# Patient Record
Sex: Female | Born: 1935
Health system: Southern US, Community
[De-identification: ages and names within clinical notes are randomized; demographics above are authoritative.]

## PROBLEM LIST (undated history)

## (undated) DIAGNOSIS — M545 Low back pain, unspecified: Secondary | ICD-10-CM

## (undated) DIAGNOSIS — D649 Anemia, unspecified: Secondary | ICD-10-CM

## (undated) DIAGNOSIS — F419 Anxiety disorder, unspecified: Secondary | ICD-10-CM

## (undated) DIAGNOSIS — I10 Essential (primary) hypertension: Secondary | ICD-10-CM

## (undated) DIAGNOSIS — I2510 Atherosclerotic heart disease of native coronary artery without angina pectoris: Secondary | ICD-10-CM

## (undated) DIAGNOSIS — E7800 Pure hypercholesterolemia, unspecified: Secondary | ICD-10-CM

## (undated) DIAGNOSIS — I872 Venous insufficiency (chronic) (peripheral): Secondary | ICD-10-CM

## (undated) DIAGNOSIS — M1990 Unspecified osteoarthritis, unspecified site: Secondary | ICD-10-CM

## (undated) HISTORY — DX: Pure hypercholesterolemia, unspecified: E78.00

## (undated) HISTORY — DX: Low back pain: M54.5

## (undated) HISTORY — DX: Essential (primary) hypertension: I10

## (undated) HISTORY — PX: ABDOMINAL HYSTERECTOMY: SHX81

## (undated) HISTORY — DX: Atherosclerotic heart disease of native coronary artery without angina pectoris: I25.10

## (undated) HISTORY — DX: Venous insufficiency (chronic) (peripheral): I87.2

## (undated) HISTORY — DX: Morbid (severe) obesity due to excess calories: E66.01

## (undated) HISTORY — DX: Unspecified osteoarthritis, unspecified site: M19.90

## (undated) HISTORY — DX: Anxiety disorder, unspecified: F41.9

## (undated) HISTORY — DX: Low back pain, unspecified: M54.50

## (undated) HISTORY — PX: CORONARY ARTERY BYPASS GRAFT: SHX141

## (undated) HISTORY — DX: Anemia, unspecified: D64.9

---

## 1998-02-08 ENCOUNTER — Other Ambulatory Visit: Admission: RE | Admit: 1998-02-08 | Discharge: 1998-02-08 | Payer: Self-pay | Admitting: Obstetrics and Gynecology

## 1999-07-29 ENCOUNTER — Encounter: Admission: RE | Admit: 1999-07-29 | Discharge: 1999-10-27 | Payer: Self-pay | Admitting: Pulmonary Disease

## 1999-10-13 ENCOUNTER — Other Ambulatory Visit: Admission: RE | Admit: 1999-10-13 | Discharge: 1999-10-13 | Payer: Self-pay | Admitting: Obstetrics & Gynecology

## 2000-03-19 ENCOUNTER — Encounter: Admission: RE | Admit: 2000-03-19 | Discharge: 2000-06-17 | Payer: Self-pay | Admitting: Pulmonary Disease

## 2000-07-02 ENCOUNTER — Encounter: Admission: RE | Admit: 2000-07-02 | Discharge: 2000-09-30 | Payer: Self-pay | Admitting: Pulmonary Disease

## 2002-12-14 ENCOUNTER — Encounter: Payer: Self-pay | Admitting: Pulmonary Disease

## 2002-12-14 ENCOUNTER — Ambulatory Visit (HOSPITAL_COMMUNITY): Admission: RE | Admit: 2002-12-14 | Discharge: 2002-12-14 | Payer: Self-pay | Admitting: Pulmonary Disease

## 2005-01-08 ENCOUNTER — Ambulatory Visit: Payer: Self-pay | Admitting: Pulmonary Disease

## 2005-01-29 ENCOUNTER — Encounter: Admission: RE | Admit: 2005-01-29 | Discharge: 2005-04-29 | Payer: Self-pay | Admitting: Pulmonary Disease

## 2005-04-30 ENCOUNTER — Encounter: Admission: RE | Admit: 2005-04-30 | Discharge: 2005-05-18 | Payer: Self-pay | Admitting: Pulmonary Disease

## 2005-05-28 ENCOUNTER — Encounter: Admission: RE | Admit: 2005-05-28 | Discharge: 2005-08-26 | Payer: Self-pay | Admitting: Pulmonary Disease

## 2005-07-07 ENCOUNTER — Ambulatory Visit: Payer: Self-pay | Admitting: Pulmonary Disease

## 2005-09-03 ENCOUNTER — Encounter: Admission: RE | Admit: 2005-09-03 | Discharge: 2005-12-02 | Payer: Self-pay | Admitting: Pulmonary Disease

## 2005-12-17 ENCOUNTER — Encounter: Admission: RE | Admit: 2005-12-17 | Discharge: 2006-01-21 | Payer: Self-pay | Admitting: Pulmonary Disease

## 2006-01-07 ENCOUNTER — Ambulatory Visit: Payer: Self-pay | Admitting: Pulmonary Disease

## 2006-02-22 ENCOUNTER — Encounter: Admission: RE | Admit: 2006-02-22 | Discharge: 2006-05-23 | Payer: Self-pay | Admitting: Pulmonary Disease

## 2006-04-22 ENCOUNTER — Encounter: Admission: RE | Admit: 2006-04-22 | Discharge: 2006-04-22 | Payer: Self-pay | Admitting: Pulmonary Disease

## 2006-07-08 ENCOUNTER — Ambulatory Visit: Payer: Self-pay | Admitting: Pulmonary Disease

## 2006-08-04 ENCOUNTER — Encounter: Admission: RE | Admit: 2006-08-04 | Discharge: 2006-11-02 | Payer: Self-pay | Admitting: Pulmonary Disease

## 2006-11-19 ENCOUNTER — Encounter: Admission: RE | Admit: 2006-11-19 | Discharge: 2007-02-17 | Payer: Self-pay | Admitting: Pulmonary Disease

## 2007-01-06 ENCOUNTER — Ambulatory Visit: Payer: Self-pay | Admitting: Pulmonary Disease

## 2007-01-06 LAB — CONVERTED CEMR LAB
AST: 29 units/L (ref 0–37)
Albumin: 3.8 g/dL (ref 3.5–5.2)
Alkaline Phosphatase: 80 units/L (ref 39–117)
BUN: 17 mg/dL (ref 6–23)
Basophils Absolute: 0.1 10*3/uL (ref 0.0–0.1)
Chloride: 103 meq/L (ref 96–112)
Cholesterol: 196 mg/dL (ref 0–200)
Creatinine, Ser: 0.8 mg/dL (ref 0.4–1.2)
Eosinophils Absolute: 0.2 10*3/uL (ref 0.0–0.6)
GFR calc non Af Amer: 76 mL/min
HDL: 51.7 mg/dL (ref >39.0)
Hgb A1c MFr Bld: 6.8 % — ABNORMAL HIGH (ref 4.6–6.0)
LDL Cholesterol: 106 mg/dL — ABNORMAL HIGH (ref 0–99)
MCHC: 33.8 g/dL (ref 30.0–36.0)
MCV: 92.3 fL (ref 78.0–100.0)
Monocytes Relative: 8.3 % (ref 3.0–11.0)
Neutrophils Relative %: 59.2 % (ref 43.0–77.0)
Platelets: 145 10*3/uL — ABNORMAL LOW (ref 150–400)
RBC: 3.79 M/uL — ABNORMAL LOW (ref 3.87–5.11)
RDW: 13.2 % (ref 11.5–14.6)
Sodium: 138 meq/L (ref 135–145)
TSH: 2.7 microintl units/mL (ref 0.35–5.50)
Total Bilirubin: 0.6 mg/dL (ref 0.3–1.2)
Total CHOL/HDL Ratio: 3.8
Triglycerides: 190 mg/dL — ABNORMAL HIGH (ref 0–149)

## 2007-02-17 ENCOUNTER — Encounter: Admission: RE | Admit: 2007-02-17 | Discharge: 2007-02-17 | Payer: Self-pay | Admitting: Pulmonary Disease

## 2007-05-12 ENCOUNTER — Ambulatory Visit: Payer: Self-pay | Admitting: Pulmonary Disease

## 2007-05-12 LAB — CONVERTED CEMR LAB
Calcium: 9.7 mg/dL (ref 8.4–10.5)
Chloride: 103 meq/L (ref 96–112)
Creatinine, Ser: 1 mg/dL (ref 0.4–1.2)
GFR calc non Af Amer: 58 mL/min
Glucose, Bld: 146 mg/dL — ABNORMAL HIGH (ref 70–99)
Hgb A1c MFr Bld: 6.9 % — ABNORMAL HIGH (ref 4.6–6.0)

## 2007-05-19 ENCOUNTER — Encounter: Admission: RE | Admit: 2007-05-19 | Discharge: 2007-08-17 | Payer: Self-pay | Admitting: Pulmonary Disease

## 2007-07-11 ENCOUNTER — Ambulatory Visit: Payer: Self-pay | Admitting: Pulmonary Disease

## 2007-07-11 LAB — CONVERTED CEMR LAB
Ketones, ur: NEGATIVE mg/dL
Specific Gravity, Urine: 1.015 (ref 1.000–1.03)
pH: 6 (ref 5.0–8.0)

## 2007-07-14 ENCOUNTER — Encounter: Admission: RE | Admit: 2007-07-14 | Discharge: 2007-08-11 | Payer: Self-pay | Admitting: Pulmonary Disease

## 2007-08-08 DIAGNOSIS — Z862 Personal history of diseases of the blood and blood-forming organs and certain disorders involving the immune mechanism: Secondary | ICD-10-CM

## 2007-08-08 DIAGNOSIS — M1990 Unspecified osteoarthritis, unspecified site: Secondary | ICD-10-CM | POA: Insufficient documentation

## 2007-08-08 DIAGNOSIS — M545 Low back pain: Secondary | ICD-10-CM

## 2007-08-08 DIAGNOSIS — F411 Generalized anxiety disorder: Secondary | ICD-10-CM | POA: Insufficient documentation

## 2007-08-08 DIAGNOSIS — I2510 Atherosclerotic heart disease of native coronary artery without angina pectoris: Secondary | ICD-10-CM | POA: Insufficient documentation

## 2007-08-08 DIAGNOSIS — I10 Essential (primary) hypertension: Secondary | ICD-10-CM

## 2007-08-08 DIAGNOSIS — E7800 Pure hypercholesterolemia, unspecified: Secondary | ICD-10-CM

## 2007-09-15 ENCOUNTER — Ambulatory Visit: Payer: Self-pay | Admitting: Pulmonary Disease

## 2007-09-15 DIAGNOSIS — E119 Type 2 diabetes mellitus without complications: Secondary | ICD-10-CM

## 2007-09-15 DIAGNOSIS — E785 Hyperlipidemia, unspecified: Secondary | ICD-10-CM | POA: Insufficient documentation

## 2007-09-15 DIAGNOSIS — J309 Allergic rhinitis, unspecified: Secondary | ICD-10-CM | POA: Insufficient documentation

## 2007-10-04 LAB — CONVERTED CEMR LAB
ALT: 28 units/L (ref 0–35)
AST: 29 units/L (ref 0–37)
Albumin: 3.9 g/dL (ref 3.5–5.2)
Alkaline Phosphatase: 89 units/L (ref 39–117)
BUN: 20 mg/dL (ref 6–23)
Basophils Absolute: 0.1 10*3/uL (ref 0.0–0.1)
Basophils Relative: 1 % (ref 0.0–1.0)
Bilirubin, Direct: 0.1 mg/dL (ref 0.0–0.3)
CO2: 29 meq/L (ref 19–32)
Calcium: 9.7 mg/dL (ref 8.4–10.5)
Chloride: 100 meq/L (ref 96–112)
Cholesterol: 189 mg/dL (ref 0–200)
Creatinine, Ser: 1.2 mg/dL (ref 0.4–1.2)
Eosinophils Absolute: 0.3 10*3/uL (ref 0.0–0.6)
Eosinophils Relative: 4.1 % (ref 0.0–5.0)
GFR calc Af Amer: 57 mL/min
GFR calc non Af Amer: 47 mL/min
Glucose, Bld: 157 mg/dL — ABNORMAL HIGH (ref 70–99)
HCT: 36.6 % (ref 36.0–46.0)
HDL: 50.5 mg/dL (ref >39.0)
Hemoglobin: 12.6 g/dL (ref 12.0–15.0)
Hgb A1c MFr Bld: 6.6 % — ABNORMAL HIGH (ref 4.6–6.0)
LDL Cholesterol: 100 mg/dL — ABNORMAL HIGH (ref 0–99)
Lymphocytes Relative: 24.1 % (ref 12.0–46.0)
MCHC: 34.4 g/dL (ref 30.0–36.0)
MCV: 92.1 fL (ref 78.0–100.0)
Monocytes Absolute: 0.6 10*3/uL (ref 0.2–0.7)
Monocytes Relative: 8.5 % (ref 3.0–11.0)
Neutro Abs: 4.3 10*3/uL (ref 1.4–7.7)
Neutrophils Relative %: 62.3 % (ref 43.0–77.0)
Platelets: 165 10*3/uL (ref 150–400)
Potassium: 4.3 meq/L (ref 3.5–5.1)
RBC: 3.98 M/uL (ref 3.87–5.11)
RDW: 13.5 % (ref 11.5–14.6)
Sodium: 138 meq/L (ref 135–145)
TSH: 2.81 microintl units/mL (ref 0.35–5.50)
Total Bilirubin: 0.6 mg/dL (ref 0.3–1.2)
Total CHOL/HDL Ratio: 3.7
Total Protein: 7.6 g/dL (ref 6.0–8.3)
Triglycerides: 195 mg/dL — ABNORMAL HIGH (ref 0–149)
VLDL: 39 mg/dL (ref 0–40)
WBC: 7 10*3/uL (ref 4.5–10.5)

## 2007-10-13 ENCOUNTER — Encounter: Admission: RE | Admit: 2007-10-13 | Discharge: 2008-01-11 | Payer: Self-pay | Admitting: Pulmonary Disease

## 2007-11-09 ENCOUNTER — Telehealth (INDEPENDENT_AMBULATORY_CARE_PROVIDER_SITE_OTHER): Payer: Self-pay | Admitting: *Deleted

## 2007-12-08 ENCOUNTER — Encounter: Payer: Self-pay | Admitting: Pulmonary Disease

## 2008-01-12 ENCOUNTER — Ambulatory Visit: Payer: Self-pay | Admitting: Pulmonary Disease

## 2008-01-15 DIAGNOSIS — I872 Venous insufficiency (chronic) (peripheral): Secondary | ICD-10-CM | POA: Insufficient documentation

## 2008-01-15 LAB — CONVERTED CEMR LAB
CO2: 28 meq/L (ref 19–32)
Calcium: 9.7 mg/dL (ref 8.4–10.5)
GFR calc Af Amer: 63 mL/min
Hgb A1c MFr Bld: 7 % — ABNORMAL HIGH (ref 4.6–6.0)
Sodium: 137 meq/L (ref 135–145)

## 2008-01-25 ENCOUNTER — Encounter: Payer: Self-pay | Admitting: Pulmonary Disease

## 2008-01-27 ENCOUNTER — Encounter: Admission: RE | Admit: 2008-01-27 | Discharge: 2008-01-27 | Payer: Self-pay | Admitting: Orthopedic Surgery

## 2008-02-06 ENCOUNTER — Encounter: Payer: Self-pay | Admitting: Pulmonary Disease

## 2008-02-09 ENCOUNTER — Encounter: Admission: RE | Admit: 2008-02-09 | Discharge: 2008-02-09 | Payer: Self-pay | Admitting: Pulmonary Disease

## 2008-04-13 ENCOUNTER — Telehealth (INDEPENDENT_AMBULATORY_CARE_PROVIDER_SITE_OTHER): Payer: Self-pay | Admitting: *Deleted

## 2008-05-16 ENCOUNTER — Encounter: Admission: RE | Admit: 2008-05-16 | Discharge: 2008-05-16 | Payer: Self-pay | Admitting: Pulmonary Disease

## 2008-06-13 ENCOUNTER — Ambulatory Visit: Payer: Self-pay | Admitting: Pulmonary Disease

## 2008-07-01 LAB — CONVERTED CEMR LAB
ALT: 31 units/L (ref 0–35)
Albumin: 3.8 g/dL (ref 3.5–5.2)
Basophils Absolute: 0.1 10*3/uL (ref 0.0–0.1)
Basophils Relative: 1.1 % (ref 0.0–3.0)
Calcium: 9.6 mg/dL (ref 8.4–10.5)
Cholesterol: 192 mg/dL (ref 0–200)
Creatinine, Ser: 1 mg/dL (ref 0.4–1.2)
Eosinophils Absolute: 0.2 10*3/uL (ref 0.0–0.7)
GFR calc non Af Amer: 58 mL/min
HCT: 37.3 % (ref 36.0–46.0)
Hemoglobin: 12.8 g/dL (ref 12.0–15.0)
Hgb A1c MFr Bld: 6.7 % — ABNORMAL HIGH (ref 4.6–6.0)
MCHC: 34.3 g/dL (ref 30.0–36.0)
MCV: 93.8 fL (ref 78.0–100.0)
Neutro Abs: 2.9 10*3/uL (ref 1.4–7.7)
RBC: 3.97 M/uL (ref 3.87–5.11)
TSH: 1.93 microintl units/mL (ref 0.35–5.50)
Total Bilirubin: 0.7 mg/dL (ref 0.3–1.2)

## 2008-09-12 ENCOUNTER — Encounter: Admission: RE | Admit: 2008-09-12 | Discharge: 2008-12-11 | Payer: Self-pay | Admitting: Pulmonary Disease

## 2008-11-28 ENCOUNTER — Encounter: Payer: Self-pay | Admitting: Pulmonary Disease

## 2008-12-12 ENCOUNTER — Ambulatory Visit: Payer: Self-pay | Admitting: Pulmonary Disease

## 2008-12-16 LAB — CONVERTED CEMR LAB
Albumin: 3.8 g/dL (ref 3.5–5.2)
Alkaline Phosphatase: 83 units/L (ref 39–117)
Basophils Absolute: 0 10*3/uL (ref 0.0–0.1)
Basophils Relative: 0.1 % (ref 0.0–3.0)
CO2: 30 meq/L (ref 19–32)
Chloride: 103 meq/L (ref 96–112)
Direct LDL: 124.7 mg/dL
Eosinophils Absolute: 0.1 10*3/uL (ref 0.0–0.7)
Glucose, Bld: 160 mg/dL — ABNORMAL HIGH (ref 70–99)
HCT: 40 % (ref 36.0–46.0)
Hemoglobin: 13.5 g/dL (ref 12.0–15.0)
Lymphocytes Relative: 24.6 % (ref 12.0–46.0)
Lymphs Abs: 1.6 10*3/uL (ref 0.7–4.0)
MCHC: 33.8 g/dL (ref 30.0–36.0)
MCV: 93.4 fL (ref 78.0–100.0)
Monocytes Absolute: 0.3 10*3/uL (ref 0.1–1.0)
Neutro Abs: 4.4 10*3/uL (ref 1.4–7.7)
Potassium: 4.1 meq/L (ref 3.5–5.1)
RBC: 4.28 M/uL (ref 3.87–5.11)
RDW: 12.9 % (ref 11.5–14.6)
Sodium: 142 meq/L (ref 135–145)
TSH: 2.2 microintl units/mL (ref 0.35–5.50)
Total CHOL/HDL Ratio: 4
Total Protein: 7.6 g/dL (ref 6.0–8.3)
Triglycerides: 170 mg/dL — ABNORMAL HIGH (ref 0.0–149.0)

## 2009-01-06 DIAGNOSIS — E559 Vitamin D deficiency, unspecified: Secondary | ICD-10-CM | POA: Insufficient documentation

## 2009-01-09 ENCOUNTER — Encounter: Payer: Self-pay | Admitting: Pulmonary Disease

## 2009-06-12 ENCOUNTER — Ambulatory Visit: Payer: Self-pay | Admitting: Pulmonary Disease

## 2009-06-12 LAB — CONVERTED CEMR LAB
Chloride: 102 meq/L (ref 96–112)
GFR calc non Af Amer: 56.73 mL/min (ref >60)
Glucose, Bld: 179 mg/dL — ABNORMAL HIGH (ref 70–99)
Hgb A1c MFr Bld: 6.9 % — ABNORMAL HIGH (ref 4.6–6.5)
Potassium: 4.3 meq/L (ref 3.5–5.1)
Sodium: 142 meq/L (ref 135–145)
Total Bilirubin: 0.5 mg/dL (ref 0.3–1.2)
Total Protein: 7 g/dL (ref 6.0–8.3)
VLDL: 37.6 mg/dL (ref 0.0–40.0)

## 2009-12-12 ENCOUNTER — Ambulatory Visit: Payer: Self-pay | Admitting: Pulmonary Disease

## 2009-12-13 LAB — CONVERTED CEMR LAB
AST: 37 units/L (ref 0–37)
Alkaline Phosphatase: 69 units/L (ref 39–117)
Basophils Absolute: 0 10*3/uL (ref 0.0–0.1)
Bilirubin, Direct: 0.1 mg/dL (ref 0.0–0.3)
Calcium: 9.6 mg/dL (ref 8.4–10.5)
Cholesterol: 202 mg/dL — ABNORMAL HIGH (ref 0–200)
Direct LDL: 120.4 mg/dL
GFR calc non Af Amer: 62.63 mL/min (ref >60)
Glucose, Bld: 143 mg/dL — ABNORMAL HIGH (ref 70–99)
HCT: 36.6 % (ref 36.0–46.0)
Hemoglobin: 12.3 g/dL (ref 12.0–15.0)
Hgb A1c MFr Bld: 7 % — ABNORMAL HIGH (ref 4.6–6.5)
Lymphs Abs: 1.2 10*3/uL (ref 0.7–4.0)
MCHC: 33.5 g/dL (ref 30.0–36.0)
MCV: 94.2 fL (ref 78.0–100.0)
Monocytes Absolute: 0.4 10*3/uL (ref 0.1–1.0)
Neutro Abs: 3.5 10*3/uL (ref 1.4–7.7)
Platelets: 154 10*3/uL (ref 150.0–400.0)
Potassium: 4 meq/L (ref 3.5–5.1)
RDW: 14 % (ref 11.5–14.6)
Sodium: 140 meq/L (ref 135–145)
Total Bilirubin: 0.3 mg/dL (ref 0.3–1.2)
VLDL: 36.2 mg/dL (ref 0.0–40.0)
Vit D, 25-Hydroxy: 42 ng/mL (ref 30–89)

## 2010-06-11 ENCOUNTER — Ambulatory Visit: Payer: Self-pay | Admitting: Pulmonary Disease

## 2010-06-16 LAB — CONVERTED CEMR LAB
AST: 41 units/L — ABNORMAL HIGH (ref 0–37)
Albumin: 3.6 g/dL (ref 3.5–5.2)
Alkaline Phosphatase: 91 units/L (ref 39–117)
BUN: 20 mg/dL (ref 6–23)
Chloride: 102 meq/L (ref 96–112)
Cholesterol: 170 mg/dL (ref 0–200)
Creatinine, Ser: 1 mg/dL (ref 0.4–1.2)
GFR calc non Af Amer: 69.02 mL/min (ref >60)
HDL: 48 mg/dL (ref >39.00)
Hgb A1c MFr Bld: 7.3 % — ABNORMAL HIGH (ref 4.6–6.5)
LDL Cholesterol: 96 mg/dL (ref 0–99)
Potassium: 4.5 meq/L (ref 3.5–5.1)
Total Bilirubin: 0.4 mg/dL (ref 0.3–1.2)
VLDL: 26 mg/dL (ref 0.0–40.0)

## 2010-06-24 ENCOUNTER — Encounter: Payer: Self-pay | Admitting: Pulmonary Disease

## 2010-07-22 ENCOUNTER — Encounter: Payer: Self-pay | Admitting: Pulmonary Disease

## 2010-09-25 NOTE — Assessment & Plan Note (Signed)
Summary: ROV 6 MONTHS///KP   CC:  6 month ROV & review of mult medical problems....  History of Present Illness: 76 y/o BF here for a 4 month follow up visit... she has mult med problems as noted below...    ~  Apr10:  she has had a good 6 mo... CC= allergies, and we discussed strategies for coping w/ all the pollen...  ~  Oct10:  she continues to have difficulty w/ diet + exercise- mult excuses & we discussed strategies for weight reduction... otherw feeling OK, takes meds regularly, no new complaints or concerns... sees chiropractor every 6 weeks- "that's all that medicare will pay"...   ~  December 12, 2009:  she is c/o incr "muscle spasms" in her buttocks which, I believe, is really radicular pain from her severe spinal dis w/ known multilevel sp stenosis> prev eval 2009 by DrGioffre & on Rx w/ Etodolac 400mg Bid, VicodinTid, FlexerilTid... she had refused shots & she states that DrG wanted to operate which she didn't agree with... she's been under the care of her Chiropractor & seen Q6wks... she requests Ortho/ Rheum second opinion regarding her pain & we will set this up for her... Unfortunately she has been unable to lose weight & according to our scales is up 15# to 299# today> we discussed diet + exercise program...   ~  June 11, 2010:  she never went to the Rheumatologist (resched for 06/24/10) & has increased her Chiropractor visits to Q2wks...  BP has been controlled on meds;  no CP, palpit, or ch in SOB;  FLP looks good on Simva80 which she tolerates well;  wt is down sl to 285# but FBS=144, & A1c=7.3 on the Glimepiride (rec dose incr to 2mg  Qam)... OK Flu shot today.    Current Problem List:    ALLERGIC RHINITIS (ICD-477.9) - on ALLEGRA 180mg /d, & FLONASE Prn...  HYPERTENSION (ICD-401.9) - controlled on TOPROLXL 100mg /d, AMLODIPINE 5mg /d,  and DYAZIDE 1/d.... BP= 146/80 today, must get her weight down!... takes meds regularly and tol well... denies HA, fatigue, visual changes, CP,  palipit, dizziness, syncope, edema, etc... pt encouraged to continue weight loss... she doesn't want a stronger fluid pill...  ARTERIOSCLEROTIC HEART DISEASE (ICD-414.00) - on ASA 325mg /d...  S/P CABGx3 8/96 by DrBartle for 3 vessel CAD... she denies CP, palpit etc... still too sedentary & limited by DJD, obesity...  ~  NuclearStressTest 5/05 was neg without ischemia & EF=70%...  VENOUS INSUFFICIENCY (ICD-459.81) - she has mod ven insuffic and edema... we discussed low sodium diet, elevation, and support hose... does not want stronger diuretic due to urination...  HYPERCHOLESTEROLEMIA (ICD-272.0) - on SIMVASTATIN 80/d...   ~  FLP 5/08 showed TChol 196, TG 190, HDL 52, LDL 106  ~  FLP 1/09 showed TChol 189, TG 195, HDL 57, LDL 100  ~  FLP 10/09 showed TChol 192, TG 146, HDL 55, LDL 108  ~  FLP 4/10 showed TChol 212, TG 170, HDL 59, LDL 125... rec> same med, take daily, get wt down.  ~  FLP 10/10 showed TChol 193, TG 188, HDL 49, LDL 106  ~  FLP 4/11 showed TChol 202, TG 181, HDL 56, LDL 120... not as good w/ recent wt gain.  ~  FLP 10/11 showed TChol 170, TG 130, HDL 48, LDL 96  DIABETES MELLITUS (ICD-250.00) - she had the metabolic syndrome x yrs but was unable to lose wt and refused Metformin rx "it made me sad"... labs 5/08 w/ BS=134 & HgA1c=6.8>  therefore started GLIMEPERIDE 1mg /d...   ~  labs 1/09 showed BS= 157, HgA1c= 6.6  ~  labs 5/09 showed BS= 168, HgA1c= 7.0.[name] KitchenMarland Kitchen rec same med, better diet, get wt down.  ~  labs 10/09 showed BS= 152, A1c= 6.7  ~  labs 4/10 showed BS= 160, A1c= 6.8  ~  labs 10/10 showed BS= 179, A1c= 6.9  ~  labs 4/11 showed BS= 143, A1c= 7.0  ~  labs 10/11 showed BS= 144, A1c= 7.3.[name]. rec> incr Glimep to 2mg  Qam.  MORBID OBESITY (ICD-278.00) - ?still goes to the [facility] nutrition [facility] monthly and sees "[facility]"... unfortunately she's been having difficulty losing weight... she is 5' tall, BMI= >>55... she has been well counselled on diet and exercise... [facility]  [facility] apparently the handicap spots are still too far away & she can't make the walk to the pool...  ~  weight 10/09 = 281#  ~  weight 4/10 = 286#  ~  weight 10/10 = 284#... we reviewed diet + exercise plan.  ~  weight 4/11 = 299#.[name] KitchenMarland Kitchen ?what happened?  ~  weight 10/11 = 285#  DEGENERATIVE JOINT DISEASE (ICD-715.90) - on ETODOLAC 400mg Bid + VICODIN Tid Prn... DrGioffre follows w/ discomfort in her L Knee, and back... also sees chiropractor...  LOW BACK PAIN, CHRONIC (ICD-724.2) - on FLEXERIL 10mg Tid Prn... [name] KitchenXRays in 2004 showed mod multilevel spondylosis w/ scoliosis...  "I'm hurting all the time" eval 6/09 by DrGioffre w/ multilevel spinal stenosis at L3-4 L4-5 L5-S1... she refused shots, & refused surg...  she has been seeing DrNazar, a chiropractor, for adjustments every 6 weeks...  ~  4/11:  c/o incr buttock pain & refuses to see DrGioffre again... she would like second opinion> Rheum consult pending.  VITAMIN D DEFICIENCY (ICD-268.9) - labs 10/09 showed Vit D level = 25... rec> take Vit D OTC 1000 u daily...  ANXIETY (ICD-300.00) - on ALPRAZOLAM 0.5mg Tid Prn...  ANEMIA, HX OF (ICD-V12.3)  ~  labs 4/10 showed Hg= 13.5  ~  labs 4/11 showed Hg= 12.3   Preventive Screening-Counseling & Management  Alcohol-Tobacco     Smoking Status: never  Allergies: 1)  ! Metformin Hcl (Metformin Hcl)  Comments:  Nurse/Medical Assistant: The patient's medications and allergies were reviewed with the patient and were updated in the Medication and Allergy Lists.  Past History:  Past Medical History: ALLERGIC RHINITIS (ICD-477.9) HYPERTENSION (ICD-401.9) ARTERIOSCLEROTIC HEART DISEASE (ICD-414.00) VENOUS INSUFFICIENCY (ICD-459.81) HYPERCHOLESTEROLEMIA (ICD-272.0) DIABETES MELLITUS (ICD-250.00) MORBID OBESITY (ICD-278.01) DEGENERATIVE JOINT DISEASE (ICD-715.90) LOW BACK PAIN, CHRONIC (ICD-724.2) VITAMIN D DEFICIENCY (ICD-268.9) ANXIETY  (ICD-300.00) ANEMIA, HX OF (ICD-V12.3)  Past Surgical History: S/P 3 vessel CABG 8/96 by DrBartle  Family History: Reviewed history from 01/12/2008 and no changes required. mother died at age 28 from heart complications 1 sibling 1 brother alive at 24 with heart problems  Social History: Reviewed history from 01/12/2008 and no changes required. pt is retired widowed has 3 children has never smoked and does not drink  Review of Systems      See HPI       The patient complains of dyspnea on exertion and difficulty walking.  The patient denies anorexia, fever, weight loss, weight gain, vision loss, decreased hearing, hoarseness, chest pain, syncope, peripheral edema, prolonged cough, headaches, hemoptysis, abdominal pain, melena, hematochezia, severe indigestion/heartburn, hematuria, incontinence, muscle weakness, suspicious skin lesions, transient blindness, depression, unusual weight change, abnormal bleeding, enlarged lymph nodes, and angioedema.  Vital Signs:  Patient profile:   76 year old female Height:      60 inches Weight:      285.13 pounds BMI:     55.89 O2 Sat:      97 % on Room air Temp:     97.0 degrees F oral Pulse rate:   96 / minute BP sitting:   146 / 80  (left arm) Cuff size:   large  Vitals Entered By: Randell Loop CMA (June 11, 2010 9:07 AM)  O2 Sat at Rest %:  97 O2 Flow:  Room air CC: 6 month ROV & review of mult medical problems... Is Patient Diabetic? Yes Pain Assessment Patient in pain? no      Comments no changes in meds today   Physical Exam  Additional Exam:  WD, Morbidly Obese, 75 y/o BF in NAD... GENERAL:  Alert & oriented; pleasant & cooperative. HEENT:  [place]/AT, EOM-wnl, PERRLA, EACs-clear, TMs-wnl, NOSE-clear, THROAT-clear & wnl. NECK:  Supple w/ fairROM; no JVD; normal carotid impulses w/o bruits; no thyromegaly or nodules palpated; no lymphadenopathy. CHEST:  Decr BS bilat, clear to P & A; without wheezes/ rales/ or rhonchi  heard... HEART:  Regular Rhythm; without murmurs/ rubs/ or gallops detected... ABDOMEN:  Obese soft & nontender, panniculus; normal bowel sounds; no organomegaly or masses palpated... EXT: without deformities, mod arthritic changes; no varicose veins/ +venous insuffic/ 1-2+ edema. BACK:  sl tender over PSIS's... can't lay flat, SLR is painful, symmetric DTR's... NEURO:  CN's intact; no focal neuro deficits... DERM:  No lesions noted; no rash etc...    MISC. Report  Procedure date:  06/11/2010  Findings:      BMP (METABOL)   Sodium                    139 mEq/L                   135-145   Potassium                 4.5 mEq/L                   3.5-5.1   Chloride                  102 mEq/L                   96-112   Carbon Dioxide            30 mEq/L                    19-32   Glucose              [H]  144 mg/dL                   16-10   BUN                       20 mg/dL                    9-60   Creatinine                1.0 mg/dL                   4.5-4.0   Calcium                   9.5 mg/dL  8.4-10.5   GFR                       69.02 mL/min                >60  Hemoglobin A1C (A1C)   Hemoglobin A1C       [H]  7.3 %                       4.6-6.5   Hepatic/Liver Function Panel (HEPATIC)   Total Bilirubin           0.4 mg/dL                   7.8-2.9   Direct Bilirubin          0.0 mg/dL                   5.6-2.1   Alkaline Phosphatase      91 U/L                      39-117   AST                  [H]  41 U/L                      0-37   ALT                       35 U/L                      0-35   Total Protein             7.0 g/dL                    3.0-8.6   Albumin                   3.6 g/dL                    5.7-8.4  Lipid Panel (LIPID)   Cholesterol               170 mg/dL                   6-962   Triglycerides             130.0 mg/dL                 9.5-284.1   HDL                       32.44 mg/dL                 >01.02   LDL Cholesterol           96  mg/dL                    7-25   Impression & Recommendations:  Problem # 1:  HYPERTENSION (ICD-401.9) Controlled>  same meds. Her updated medication list for this problem includes:    Toprol Xl 100 Mg Tb24 (Metoprolol succinate) .[name]... Take 1 tablet by mouth once a day    Amlodipine Besylate 5 Mg Tabs (Amlodipine besylate) .[name]... Take 1 tablet by mouth once a day    Triamterene-hctz 37.5-25 Mg Tabs (Triamterene-hctz) .[name]... Take 1 tablet  by mouth once a day  Orders: TLB-BMP (Basic Metabolic Panel-BMET) (80048-METABOL) TLB-A1C / Hgb A1C (Glycohemoglobin) (83036-A1C) TLB-Hepatic/Liver Function Pnl (80076-HEPATIC) TLB-Lipid Panel (80061-LIPID)  Problem # 2:  ARTERIOSCLEROTIC HEART DISEASE (ICD-414.00) Stable w/o angina, etc... Her updated medication list for this problem includes:    Aspirin 325 Mg Tabs (Aspirin) .[name]... Take 1 tablet by mouth once a day    Toprol Xl 100 Mg Tb24 (Metoprolol succinate) .[name]... Take 1 tablet by mouth once a day    Amlodipine Besylate 5 Mg Tabs (Amlodipine besylate) .[name]... Take 1 tablet by mouth once a day    Triamterene-hctz 37.5-25 Mg Tabs (Triamterene-hctz) .[name]... Take 1 tablet by mouth once a day  Problem # 3:  HYPERCHOLESTEROLEMIA (ICD-272.0) FLP looks good & tolerating the Simva satis... Her updated medication list for this problem includes:    Simvastatin 80 Mg Tabs (Simvastatin) .[name]... Take 1 tablet by mouth once a day  Problem # 4:  DIABETES MELLITUS (ICD-250.00) Her A1c continues to climb... needs better diet & lose weight... for now incr the Glimep tp 2mg /d... Her updated medication list for this problem includes:    Aspirin 325 Mg Tabs (Aspirin) .[name]... Take 1 tablet by mouth once a day    Glimepiride 2 Mg Tabs (Glimepiride) .[name]... Take 1 tab by mouth once daily...  Problem # 5:  MORBID OBESITY (ICD-278.01) Diet + exercise are the keys...  Problem # 6:  LOW BACK PAIN, CHRONIC (ICD-724.2) Followed by Ortho, Chiropractor, & Rheum consult  pending... Her updated medication list for this problem includes:    Aspirin 325 Mg Tabs (Aspirin) .[name]... Take 1 tablet by mouth once a day    Etodolac 400 Mg Tabs (Etodolac) .[name]... 1 tab by mouth two times a day as needed for arthritis pain... (take w/ food)    Vicodin 5-500 Mg Tabs (Hydrocodone-acetaminophen) .[name]... Take one tablet by mouth three times a day as needed for pain    Flexeril 10 Mg Tabs (Cyclobenzaprine hcl) .[name]... Take one tablet by mouth three times a day as needed for muscle spasms  Problem # 7:  OTHER MEDICAL PROBLEMS AS NOTED>>> OK Flu vaccine today...  Complete Medication List: 1)  Flonase 50 Mcg/act Susp (Fluticasone propionate) .... 2 sprays in each nostril at bedtime.[name] KitchenMarland Kitchen 2)  Fexofenadine Hcl 180 Mg Tabs (Fexofenadine hcl) .... Take 1 tablet by mouth once a day 3)  Aspirin 325 Mg Tabs (Aspirin) .... Take 1 tablet by mouth once a day 4)  Toprol Xl 100 Mg Tb24 (Metoprolol succinate) .... Take 1 tablet by mouth once a day 5)  Amlodipine Besylate 5 Mg Tabs (Amlodipine besylate) .... Take 1 tablet by mouth once a day 6)  Triamterene-hctz 37.5-25 Mg Tabs (Triamterene-hctz) .... Take 1 tablet by mouth once a day 7)  Simvastatin 80 Mg Tabs (Simvastatin) .... Take 1 tablet by mouth once a day 8)  Glimepiride 2 Mg Tabs (Glimepiride) .... Take 1 tab by mouth once daily.[name] KitchenMarland Kitchen 9)  Etodolac 400 Mg Tabs (Etodolac) .[name].. 1 tab by mouth two times a day as needed for arthritis pain... (take w/ food) 10)  Vicodin 5-500 Mg Tabs (Hydrocodone-acetaminophen) .... Take one tablet by mouth three times a day as needed for pain 11)  Flexeril 10 Mg Tabs (Cyclobenzaprine hcl) .... Take one tablet by mouth three times a day as needed for muscle spasms 12)  Alprazolam 0.5 Mg Tabs (Alprazolam) .... Take 1 tablet by mouth three times a day as needed for nerves... 13)  Protegra Caps (Multiple vitamins-minerals) .[name] KitchenMarland KitchenMarland Kitchen  Take 1 capsule by mouth once a day 14)  Vitamin D 1000 Unit Caps (Cholecalciferol) .... Take 1  cap daily...  Other Orders: Flu Vaccine 74yrs + MEDICARE PATIENTS (Z6109) Administration Flu vaccine - MCR (U0454)  Patient Instructions: 1)  Today we updated your med list- see below.... 2)  We refilled your Vicodin & Alprazolam as requested... 3)  Today we did your follow up FASTING blood work... please call the "phone tree" in a few days for your lab results.[name] KitchenMarland Kitchen 4)  We also gave you the 2011 Flu vaccine... 5)  Be sure to start that FOOD DIARY to aide in your diet program & weight loss efforts... 6)  Call for any problems.[name] KitchenMarland Kitchen 7)  Please schedule a follow-up appointment in 6 months. Prescriptions: ALPRAZOLAM 0.5 MG  TABS (ALPRAZOLAM) Take 1 tablet by mouth three times a day as needed for nerves...  #90 x 6   Entered and Authorized by:   Michele Mcalpine MD   Signed by:   Michele Mcalpine MD on 06/11/2010   Method used:   Print then Give to Patient   RxID:   [id number] VICODIN 5-500 MG TABS (HYDROCODONE-ACETAMINOPHEN) take one tablet by mouth three times a day as needed for pain  #90 x 6   Entered and Authorized by:   Michele Mcalpine MD   Signed by:   Michele Mcalpine MD on 06/11/2010   Method used:   Print then Give to Patient   RxID:   [id number]       Flu Vaccine Consent Questions     Do you have a history of severe allergic reactions to this vaccine? no    Any prior history of allergic reactions to egg and/or gelatin? no    Do you have a sensitivity to the preservative Thimersol? no    Do you have a past history of Guillan-Barre Syndrome? no    Do you currently have an acute febrile illness? no    Have you ever had a severe reaction to latex? no    Vaccine information given and explained to patient? yes    Are you currently pregnant? no    Lot Number:[id number]   Exp Date:02/21/2011   Site Given  Left Deltoid IMu1 Randell Loop [facility]  June 11, 2010 9:59 AM

## 2010-09-25 NOTE — Consult Note (Signed)
Summary: [facility]  [facility]   Imported By: Sherian Rein 07/05/2010 11:15:06  _____________________________________________________________________  External Attachment:    Type:   Image     Comment:   External Document

## 2010-09-25 NOTE — Assessment & Plan Note (Signed)
Summary: 6 months/apc   CC:  6 month ROV & review of mult medical problems....  History of Present Illness: 76 y/o BF here for a 4 month follow up visit... she has mult med problems as noted below...    ~  December 12, 2008:  she has had a good 6 mo... CC= allergies, and we discussed strategies for coping w/ all the pollen...   ~  June 12, 2009:  she continues to have difficulty w/ diet + exercise- mult excuses & we discussed strategies for weight reduction... otherw feeling OK, takes meds regularly, no new complaints or concerns... sees chiropractor every 6 weeks- "that's all that medicare will pay"...   ~  December 12, 2009:  she is c/o incr "muscle spasms" in her buttocks which, I believe, is really radicular pain from her severe spinal dis w/ known multilevel sp stenosis> prev eval 2009 by DrGioffre & on Rx w/ Etodolac 400mg Bid, VicodinTid, FlexerilTid... she had refused shots & she states that DrG wanted to operate which she didn't agree with... she's been under the care of her Chiropractor & seen Q6wks... she requests Ortho/ Rheum second opinion regarding her pain & we will set this up for her... Unfortunately she has been unable to lose weight & according to our scales is up 15# to 299# today> we discussed diet + exercise program... due for fasting blood work.    Current Problem List:    ALLERGIC RHINITIS (ICD-477.9) - on ALLEGRA 180mg /d, & FLONASE Prn...  HYPERTENSION (ICD-401.9) - controlled on TOPROLXL 100mg /d, AMLODIPINE 5mg /d,  and DYAZIDE 1/d.... BP= 146/80 today, must get her weight down!... takes meds regularly and tol well... denies HA, fatigue, visual changes, CP, palipit, dizziness, syncope, edema, etc... pt encouraged to continue weight loss... she doesn't want a stronger fluid pill...  ARTERIOSCLEROTIC HEART DISEASE (ICD-414.00) - on ASA 325mg /d...  S/P CABGx3 8/96 by DrBartle for 3 vessel CAD... she denies CP, palpit etc... still too sedentary & limited by DJD, obesity...  ~   NuclearStressTest 5/05 was neg without ischemia & EF=70%...  VENOUS INSUFFICIENCY (ICD-459.81) - she has mod ven insuffic and edema... we discussed low sodium diet, elevation, and support hose... does not want stronger diuretic due to urination...  HYPERCHOLESTEROLEMIA (ICD-272.0) - on SIMVASTATIN 80/d...   ~  FLP 5/08 showed TChol 196, TG 190, HDL 52, LDL 106  ~  FLP 1/09 showed TChol 189, TG 195, HDL 57, LDL 100  ~  FLP 10/09 showed TChol 192, TG 146, HDL 55, LDL 108  ~  FLP 4/10 showed TChol 212, TG 170, HDL 59, LDL 125... rec> same med, take daily, get wt down.  ~  FLP 10/10 showed TChol 193, TG 188, HDL 49, LDL 106  ~  FLP 4/11 showed TChol 202, TG 181, HDL 56, LDL 120... not as good w/ recent wt gain.  DIABETES MELLITUS (ICD-250.00) - she had the metabolic syndrome x yrs but was unable to lose wt and refused Metformin rx "it made me sad"... labs 5/08 w/ BS=134 & HgA1c=6.8> therefore started GLIMEPERIDE 1mg /d...   ~  labs 1/09 showed BS= 157, HgA1c= 6.6  ~  labs 5/09 showed BS= 168, HgA1c= 7.0.[name] KitchenMarland Kitchen rec same med, better diet, get wt down.  ~  labs 10/09 showed BS= 152, A1c= 6.7  ~  labs 4/10 showed BS= 160, A1c= 6.8  ~  labs 10/10 showed BS= 179, A1c= 6.9  ~  labs 4/11 showed BS= 143, A1c= 7.0  MORBID OBESITY (ICD-278.00) - ?still  goes to the [facility] nutrition center monthly and sees "Maggie May"... unfortunately she's been having difficulty losing weight... she is 5' tall, BMI= >>55... she has been well counselled on diet and exercise... [facility]... apparently the handicap spots are still too far away & she can't make the walk to the pool...  ~  weight 10/09 = 281#  ~  weight 4/10 = 286#  ~  weight 10/10 = 284#... we reviewed diet + exercise plan.  ~  weight 4/11 = 299#.[name] KitchenMarland Kitchen ?what happened?  DEGENERATIVE JOINT DISEASE (ICD-715.90) - on ETODOLAC 400mg Bid + VICODIN Tid Prn... DrGioffre follows w/ discomfort in her L Knee, and back...  also sees chiropractor...  LOW BACK PAIN, CHRONIC (ICD-724.2) - on FLEXERIL 10mg Tid Prn... [name] KitchenXRays in 2004 showed mod multilevel spondylosis w/ scoliosis...  "I'm hurting all the time" eval 6/09 by DrGioffre w/ multilevel spinal stenosis at L3-4 L4-5 L5-S1... she refused shots, & refused surg...  she has been seeing DrNazar, a chiropractor, for adjustments every 6 weeks...  ~  4/11:  c/o incr buttock pain & refuses to see Drgioffre again... she would like second opinion.  VITAMIN D DEFICIENCY (ICD-268.9) - labs 10/09 showed Vit D level = 25... rec> take Vit D OTC 1000 u daily...  ANXIETY (ICD-300.00) - on ALPRAZOLAM 0.5mg Tid Prn...  ANEMIA, HX OF (ICD-V12.3)  ~  labs 4/10 showed Hg= 13.5  ~  labs 4/11 showed Hg= 12.3   Allergies: 1)  ! Metformin Hcl (Metformin Hcl)  Comments:  Nurse/Medical Assistant: The patient's medications and allergies were reviewed with the patient and were updated in the Medication and Allergy Lists.  Past History:  Past Medical History: ALLERGIC RHINITIS (ICD-477.9) HYPERTENSION (ICD-401.9) ARTERIOSCLEROTIC HEART DISEASE (ICD-414.00) VENOUS INSUFFICIENCY (ICD-459.81) HYPERCHOLESTEROLEMIA (ICD-272.0) DIABETES MELLITUS (ICD-250.00) MORBID OBESITY (ICD-278.01) DEGENERATIVE JOINT DISEASE (ICD-715.90) LOW BACK PAIN, CHRONIC (ICD-724.2) VITAMIN D DEFICIENCY (ICD-268.9) ANXIETY (ICD-300.00) ANEMIA, HX OF (ICD-V12.3)  Past Surgical History: S/P 3 vessel CABG 8/96 by DrBartle  Family History: Reviewed history from 01/12/2008 and no changes required. mother died at age 75 from heart complications 1 sibling 1 brother alive at 40 with heart problems  Social History: Reviewed history from 01/12/2008 and no changes required. pt is retired widowed has 3 children has never smoked and does not drink  Review of Systems      See HPI       The patient complains of dyspnea on exertion, peripheral edema, and difficulty walking.  The patient denies anorexia,  fever, weight loss, weight gain, vision loss, decreased hearing, hoarseness, chest pain, syncope, prolonged cough, headaches, hemoptysis, abdominal pain, melena, hematochezia, severe indigestion/heartburn, hematuria, incontinence, muscle weakness, suspicious skin lesions, transient blindness, depression, unusual weight change, abnormal bleeding, enlarged lymph nodes, and angioedema.    Vital Signs:  Patient profile:   76 year old female Height:      60 inches Weight:      298.50 pounds BMI:     58.51 O2 Sat:      95 % on Room air Temp:     99.4 degrees F oral Pulse rate:   107 / minute BP sitting:   146 / 80  (left arm) Cuff size:   large  Vitals Entered By: Randell Loop CMA (December 12, 2009 9:38 AM)  O2 Sat at Rest %:  95 O2 Flow:  Room air CC: 6 month ROV & review of mult medical problems... Is Patient Diabetic? Yes Pain Assessment Patient in  pain? yes      Onset of pain  having some muscle spasms Comments no changes in meds today   Physical Exam  Additional Exam:  WD, Morbidly Obese, 76 y/o BF in NAD... GENERAL:  Alert & oriented; pleasant & cooperative. HEENT:  [place]/AT, EOM-wnl, PERRLA, EACs-clear, TMs-wnl, NOSE-clear, THROAT-clear & wnl. NECK:  Supple w/ fairROM; no JVD; normal carotid impulses w/o bruits; no thyromegaly or nodules palpated; no lymphadenopathy. CHEST:  Decr BS bilat, clear to P & A; without wheezes/ rales/ or rhonchi heard... HEART:  Regular Rhythm; without murmurs/ rubs/ or gallops detected... ABDOMEN:  Obese soft & nontender, panniculus; normal bowel sounds; no organomegaly or masses palpated... EXT: without deformities, mod arthritic changes; no varicose veins/ +venous insuffic/ 1-2+ edema. BACK:  sl tender over PSIS's... can't lay flat, SLR is painful, symmetric DTR's... NEURO:  CN's intact; no focal neuro deficits... DERM:  No lesions noted; no rash etc...     MISC. Report  Procedure date:  12/12/2009  Findings:      BMP (METABOL)   Sodium                     140 mEq/L                   135-145   Potassium                 4.0 mEq/L                   3.5-5.1   Chloride                  104 mEq/L                   96-112   Carbon Dioxide            26 mEq/L                    19-32   Glucose              [H]  143 mg/dL                   36-64   BUN                       20 mg/dL                    4-03   Creatinine                1.1 mg/dL                   4.7-4.2   Calcium                   9.6 mg/dL                   5.9-56.3   GFR                       62.63 mL/min                >60  Hemoglobin A1C (A1C)   Hemoglobin A1C       [H]  7.0 %                       4.6-6.5  Hepatic/Liver Function Panel (HEPATIC)   Total  Bilirubin           0.3 mg/dL                   6.0-4.5   Direct Bilirubin          0.1 mg/dL                   4.0-9.8   Alkaline Phosphatase      69 U/L                      39-117   AST                       37 U/L                      0-37   ALT                       30 U/L                      0-35   Total Protein             7.7 g/dL                    1.1-9.1   Albumin                   3.9 g/dL                    4.7-8.2  CBC Platelet w/Diff (CBCD)   White Cell Count          5.2 K/uL                    4.5-10.5   Red Cell Count            3.88 Mil/uL                 3.87-5.11   Hemoglobin                12.3 g/dL                   95.6-21.3   Hematocrit                36.6 %                      36.0-46.0   MCV                       94.2 fl                     78.0-100.0   Platelet Count            154.0 K/uL                  150.0-400.0   Neutrophil %              68.3 %                      43.0-77.0   Lymphocyte %              22.3 %                      12.0-46.0  Monocyte %                7.5 %                       3.0-12.0   Eosinophils%              1.6 %                       0.0-5.0   Basophils %               0.3 %                       0.0-3.0  Comments:      Lipid Panel (LIPID)    Cholesterol          [H]  202 mg/dL                   4-696   Triglycerides        [H]  181.0 mg/dL                 2.9-528.4   HDL                       13.24 mg/dL                 >40.10  Cholesterol LDL - Direct                             120.4 mg/dL   TSH (TSH)   FastTSH                   2.33 uIU/mL                 0.35-5.50  Hemoglobin A1C (A1C)   Hemoglobin A1C       [H]  7.0 %                       4.6-6.5  Vitamin D (25-Hydroxy) (27253)  Vitamin D (25-Hydroxy)                             42 ng/mL                    30-89   Impression & Recommendations:  Problem # 1:  HYPERTENSION (ICD-401.9) Controlled on meds but we discussed the critically import role of weight reduction in maintaining her BP control w/o having to incr meds over time... Her updated medication list for this problem includes:    Toprol Xl 100 Mg Tb24 (Metoprolol succinate) .[name]... Take 1 tablet by mouth once a day    Amlodipine Besylate 5 Mg Tabs (Amlodipine besylate) .[name]... Take 1 tablet by mouth once a day    Triamterene-hctz 37.5-25 Mg Tabs (Triamterene-hctz) .[name]... Take 1 tablet by mouth once a day  Orders: Prescription Created Electronically [phone]) TLB-BMP (Basic Metabolic Panel-BMET) (80048-METABOL) TLB-Hepatic/Liver Function Pnl (80076-HEPATIC) TLB-CBC Platelet - w/Differential (85025-CBCD) TLB-Lipid Panel (80061-LIPID) TLB-TSH (Thyroid Stimulating Hormone) (84443-TSH) TLB-A1C / Hgb A1C (Glycohemoglobin) (83036-A1C) T-Vitamin D (25-Hydroxy) (34742-59563)  Problem # 2:  ARTERIOSCLEROTIC HEART DISEASE (ICD-414.00) No angina-  but she is way too sedentary & must start moving to lose weight... [facility] Her updated medication list for this problem  includes:    Aspirin 325 Mg Tabs (Aspirin) .[name]... Take 1 tablet by mouth once a day    Toprol Xl 100 Mg Tb24 (Metoprolol succinate) .[name]... Take 1 tablet by mouth once a day    Amlodipine Besylate 5 Mg Tabs (Amlodipine besylate) .[name]... Take 1 tablet by  mouth once a day    Triamterene-hctz 37.5-25 Mg Tabs (Triamterene-hctz) .[name]... Take 1 tablet by mouth once a day  Problem # 3:  VENOUS INSUFFICIENCY (ICD-459.81) She understands the need for no salt, elevation, TED hose...  Problem # 4:  HYPERCHOLESTEROLEMIA (ICD-272.0) Labs aren't as good w/ her 15 lb weight gain... must lose weight or add meds! Her updated medication list for this problem includes:    Simvastatin 80 Mg Tabs (Simvastatin) .[name]... Take 1 tablet by mouth once a day  Problem # 5:  DIABETES MELLITUS (ICD-250.00) A1c is up to 7>  must lose weight or add meds... Her updated medication list for this problem includes:    Aspirin 325 Mg Tabs (Aspirin) .[name]... Take 1 tablet by mouth once a day    Glimepiride 1 Mg Tabs (Glimepiride) .[name]... 1 tab by mouth qam  Problem # 6:  MORBID OBESITY (ICD-278.01) Obviously weight reduction is key...  Problem # 7:  LOW BACK PAIN, CHRONIC (ICD-724.2) We will arrange for Rheum consult for her back & buttock pain... Her updated medication list for this problem includes:    Aspirin 325 Mg Tabs (Aspirin) .[name]... Take 1 tablet by mouth once a day    Etodolac 400 Mg Tabs (Etodolac) .[name]... 1 tab by mouth two times a day as needed for arthritis pain... (take w/ food)    Vicodin 5-500 Mg Tabs (Hydrocodone-acetaminophen) .[name]... Take one tablet by mouth three times a day as needed for pain    Flexeril 10 Mg Tabs (Cyclobenzaprine hcl) .[name]... Take one tablet by mouth three times a day as needed for muscle spasms  Orders: Rheumatology Referral (Rheumatology)  Problem # 8:  VITAMIN D DEFICIENCY (ICD-268.9) Taking 1000 u daily... Vit d level is OK.  Problem # 9:  ANXIETY (ICD-300.00) Assessment: Unchanged  Her updated medication list for this problem includes:    Alprazolam 0.5 Mg Tabs (Alprazolam) .[name]... Take 1 tablet by mouth three times a day as needed for nerves...  Complete Medication List: 1)  Flonase 50 Mcg/act Susp (Fluticasone propionate) .... 2 sprays  in each nostril at bedtime.[name] KitchenMarland Kitchen 2)  Fexofenadine Hcl 180 Mg Tabs (Fexofenadine hcl) .... Take 1 tablet by mouth once a day 3)  Aspirin 325 Mg Tabs (Aspirin) .... Take 1 tablet by mouth once a day 4)  Toprol Xl 100 Mg Tb24 (Metoprolol succinate) .... Take 1 tablet by mouth once a day 5)  Amlodipine Besylate 5 Mg Tabs (Amlodipine besylate) .... Take 1 tablet by mouth once a day 6)  Triamterene-hctz 37.5-25 Mg Tabs (Triamterene-hctz) .... Take 1 tablet by mouth once a day 7)  Simvastatin 80 Mg Tabs (Simvastatin) .... Take 1 tablet by mouth once a day 8)  Glimepiride 1 Mg Tabs (Glimepiride) .[name].. 1 tab by mouth qam 9)  Etodolac 400 Mg Tabs (Etodolac) .[name].. 1 tab by mouth two times a day as needed for arthritis pain... (take w/ food) 10)  Vicodin 5-500 Mg Tabs (Hydrocodone-acetaminophen) .... Take one tablet by mouth three times a day as needed for pain 11)  Flexeril 10 Mg Tabs (Cyclobenzaprine hcl) .... Take one tablet by mouth three times a day as needed for muscle spasms 12)  Alprazolam  0.5 Mg Tabs (Alprazolam) .... Take 1 tablet by mouth three times a day as needed for nerves... 13)  Protegra Caps (Multiple vitamins-minerals) .... Take 1 capsule by mouth once a day 14)  Vitamin D 1000 Unit Caps (Cholecalciferol) .... Take 1 cap daily...  Patient Instructions: 1)  Today we updated your med list- see below.... 2)  Continue your current meds the same... 3)  We will arrange for a RHEUMATOLOGY consultation for your arthritis and back/ butt pain.[name] KitchenMarland Kitchen 4)  Today we did your f/u fasting blood work...  please call the "phone tree" in a few days for your lab results.[name] KitchenMarland Kitchen 5)  Call for any questions.[name] KitchenMarland Kitchen 6)  Please schedule a follow-up appointment in 6 months. Prescriptions: ALPRAZOLAM 0.5 MG  TABS (ALPRAZOLAM) Take 1 tablet by mouth three times a day as needed for nerves...  #90 x prn   Entered and Authorized by:   Michele Mcalpine MD   Signed by:   Michele Mcalpine MD on 12/12/2009   Method used:   Print then Give to  Patient   RxID:   [id number] FLEXERIL 10 MG TABS (CYCLOBENZAPRINE HCL) take one tablet by mouth three times a day as needed for muscle spasms  #90 x prn   Entered and Authorized by:   Michele Mcalpine MD   Signed by:   Michele Mcalpine MD on 12/12/2009   Method used:   Print then Give to Patient   RxID:   [id number] VICODIN 5-500 MG TABS (HYDROCODONE-ACETAMINOPHEN) take one tablet by mouth three times a day as needed for pain  #100 x 5   Entered and Authorized by:   Michele Mcalpine MD   Signed by:   Michele Mcalpine MD on 12/12/2009   Method used:   Print then Give to Patient   RxID:   [id number] ETODOLAC 400 MG  TABS (ETODOLAC) 1 tab by mouth two times a day as needed for arthritis pain... (take w/ food)  #60 x prn   Entered and Authorized by:   Michele Mcalpine MD   Signed by:   Michele Mcalpine MD on 12/12/2009   Method used:   Print then Give to Patient   RxID:   [id number] GLIMEPIRIDE 1 MG  TABS (GLIMEPIRIDE) 1 tab by mouth qam  #30 x prn   Entered and Authorized by:   Michele Mcalpine MD   Signed by:   Michele Mcalpine MD on 12/12/2009   Method used:   Print then Give to Patient   RxID:   [id number] SIMVASTATIN 80 MG TABS (SIMVASTATIN) Take 1 tablet by mouth once a day  #30 x prn   Entered and Authorized by:   Michele Mcalpine MD   Signed by:   Michele Mcalpine MD on 12/12/2009   Method used:   Print then Give to Patient   RxID:   [id number] TRIAMTERENE-HCTZ 37.5-25 MG  TABS (TRIAMTERENE-HCTZ) Take 1 tablet by mouth once a day  #30 x prn   Entered and Authorized by:   Michele Mcalpine MD   Signed by:   Michele Mcalpine MD on 12/12/2009   Method used:   Print then Give to Patient   RxID:   [id number] AMLODIPINE BESYLATE 5 MG  TABS (AMLODIPINE BESYLATE) Take 1 tablet by mouth once a day  #30 x prn   Entered and Authorized by:   Michele Mcalpine MD   Signed by:   Michele Mcalpine MD on  12/12/2009   Method used:   Print then Give to Patient   RxID:   [id number] TOPROL XL  100 MG  TB24 (METOPROLOL SUCCINATE) Take 1 tablet by mouth once a day  #30 x prn   Entered and Authorized by:   Michele Mcalpine MD   Signed by:   Michele Mcalpine MD on 12/12/2009   Method used:   Print then Give to Patient   RxID:   [id number] FEXOFENADINE HCL 180 MG TABS (FEXOFENADINE HCL) Take 1 tablet by mouth once a day  #30 x prn   Entered and Authorized by:   Michele Mcalpine MD   Signed by:   Michele Mcalpine MD on 12/12/2009   Method used:   Print then Give to Patient   RxID:   [id number] FLONASE 50 MCG/ACT  SUSP (FLUTICASONE PROPIONATE) 2 sprays in each nostril at bedtime...  #1 x prn   Entered and Authorized by:   Michele Mcalpine MD   Signed by:   Michele Mcalpine MD on 12/12/2009   Method used:   Print then Give to Patient   RxID:   [id number]    Immunization History:  Influenza Immunization History:    Influenza:  historical (06/04/2009)

## 2010-09-25 NOTE — Letter (Signed)
Summary: [facility]  [facility]   Imported By: Lennie Odor 08/08/2010 14:39:58  _____________________________________________________________________  External Attachment:    Type:   Image     Comment:   External Document

## 2010-12-09 ENCOUNTER — Encounter: Payer: Self-pay | Admitting: Pulmonary Disease

## 2010-12-11 ENCOUNTER — Ambulatory Visit: Payer: Self-pay | Admitting: Pulmonary Disease

## 2010-12-24 ENCOUNTER — Other Ambulatory Visit: Payer: Self-pay | Admitting: Pulmonary Disease

## 2010-12-25 ENCOUNTER — Other Ambulatory Visit: Payer: Self-pay | Admitting: Pulmonary Disease

## 2011-01-06 NOTE — Assessment & Plan Note (Signed)
Erika Gates                             PULMONARY OFFICE NOTE   Erika Gates, Erika Gates                     MRN:          [id number]  DATE:07/11/2007                            DOB:          1935-03-10    HISTORY OF PRESENT ILLNESS:  The patient is a 76 year old African-  American female patient of Dr. Jodelle Green who has a known history of  hypertension, hyperlipidemia and osteoarthritis, that presents today for  an acute office visit.  The patient complains that 1 week ago she woke  up with severe chills, low-grade fever and body aches.  Patient started  noticing some nasal congestion and productive cough with thick clear  white sputum.  Patient has a decreased appetite and general malaise  along with body aches.  She denies any headache, neck pain, chest pain,  vomiting, diarrhea, bloody stools, rash, joint pain or swelling.  The  patient has not used any medications for treatment.   PAST MEDICAL HISTORY:  Reviewed.   CURRENT MEDICATIONS:  Reviewed.   PHYSICAL EXAMINATION:  GENERAL:  The patient is a morbidly obese female  in no acute distress.  VITAL SIGNS:  Temperature is 100.1, blood pressure 136/73, O2 saturation  is 94% on room air.  HEENT:  Unremarkable.  NECK:  Supple without cervical adenopathy, no JVD. No nuchal rigidity.  LUNGS:  Clear.  CARDIAC:  Regular rate.  ABDOMEN:  Morbidly obese, soft, nontender, no palpable  hepatosplenomegaly. Negative CVA tenderness.  EXTREMITIES:  Warm without any cyanosis or clubbing, there is trace  edema.  SKIN:  Warm without rash. No focal deficit is detected.   IMPRESSION AND PLAN:  1. Suspect patient has a viral syndrome, possibly influenza along with      upper respiratory infection symptoms. The patient is recommended to      use Mucinex DM twice daily, Allegra daily and Nasonex as      recommended.  Patient was given a Z-Pak to have on hold in case      symptoms worsen with purulent sputum.  She  may take Tylenol p.r.n.      for fever and body aches.  Patient is to increase fluid intake.  A      urinalysis is pending to rule out possible underlying urinary tract      infection.  Patient is to return back here as scheduled or sooner      if      needed.  The patient is to contact our office for sooner followup      if symptoms do not improve or worsen.      Rubye Oaks, NP  Electronically Signed      Lonzo Cloud. Kriste Basque, MD  Electronically Signed   TP/MedQ  DD: 07/11/2007  DT: 07/11/2007  Job #: [id number]

## 2011-01-23 ENCOUNTER — Other Ambulatory Visit: Payer: Self-pay | Admitting: Pulmonary Disease

## 2011-01-27 ENCOUNTER — Other Ambulatory Visit: Payer: Self-pay | Admitting: *Deleted

## 2011-01-27 MED ORDER — ALPRAZOLAM 0.5 MG PO TABS: 0.5000 mg | ORAL_TABLET | Freq: Three times a day (TID) | ORAL | Status: DC | PRN

## 2011-02-02 ENCOUNTER — Encounter: Payer: Self-pay | Admitting: Pulmonary Disease

## 2011-02-06 ENCOUNTER — Telehealth: Payer: Self-pay | Admitting: Pulmonary Disease

## 2011-02-06 ENCOUNTER — Ambulatory Visit: Payer: Self-pay | Admitting: Pulmonary Disease

## 2011-02-06 NOTE — Telephone Encounter (Signed)
Error.Erika Gates ° °

## 2011-02-20 ENCOUNTER — Other Ambulatory Visit: Payer: Self-pay | Admitting: Pulmonary Disease

## 2011-02-26 ENCOUNTER — Ambulatory Visit: Payer: Self-pay | Admitting: Pulmonary Disease

## 2011-03-27 ENCOUNTER — Other Ambulatory Visit (INDEPENDENT_AMBULATORY_CARE_PROVIDER_SITE_OTHER): Payer: Medicare Other

## 2011-03-27 ENCOUNTER — Ambulatory Visit (INDEPENDENT_AMBULATORY_CARE_PROVIDER_SITE_OTHER): Payer: Medicare Other | Admitting: Pulmonary Disease

## 2011-03-27 ENCOUNTER — Encounter: Payer: Self-pay | Admitting: Pulmonary Disease

## 2011-03-27 DIAGNOSIS — M545 Low back pain, unspecified: Secondary | ICD-10-CM

## 2011-03-27 DIAGNOSIS — I872 Venous insufficiency (chronic) (peripheral): Secondary | ICD-10-CM

## 2011-03-27 DIAGNOSIS — E559 Vitamin D deficiency, unspecified: Secondary | ICD-10-CM

## 2011-03-27 DIAGNOSIS — I2510 Atherosclerotic heart disease of native coronary artery without angina pectoris: Secondary | ICD-10-CM

## 2011-03-27 DIAGNOSIS — E119 Type 2 diabetes mellitus without complications: Secondary | ICD-10-CM

## 2011-03-27 DIAGNOSIS — I10 Essential (primary) hypertension: Secondary | ICD-10-CM

## 2011-03-27 DIAGNOSIS — E7800 Pure hypercholesterolemia, unspecified: Secondary | ICD-10-CM

## 2011-03-27 DIAGNOSIS — F411 Generalized anxiety disorder: Secondary | ICD-10-CM

## 2011-03-27 DIAGNOSIS — M1990 Unspecified osteoarthritis, unspecified site: Secondary | ICD-10-CM

## 2011-03-27 LAB — BASIC METABOLIC PANEL
CO2: 30 mEq/L (ref 19–32)
Calcium: 9.5 mg/dL (ref 8.4–10.5)
Chloride: 106 mEq/L (ref 96–112)
Sodium: 145 mEq/L (ref 135–145)

## 2011-03-27 LAB — HEMOGLOBIN A1C: Hgb A1c MFr Bld: 7.2 % — ABNORMAL HIGH (ref 4.6–6.5)

## 2011-03-27 MED ORDER — FLUTICASONE PROPIONATE 50 MCG/ACT NA SUSP: 2.0000 | Freq: Every day | NASAL | Status: DC

## 2011-03-27 MED ORDER — AMLODIPINE BESYLATE 5 MG PO TABS: 5.0000 mg | ORAL_TABLET | Freq: Every day | ORAL | Status: DC

## 2011-03-27 MED ORDER — ALPRAZOLAM 0.5 MG PO TABS: 0.5000 mg | ORAL_TABLET | Freq: Three times a day (TID) | ORAL | Status: DC | PRN

## 2011-03-27 MED ORDER — TRIAMTERENE-HCTZ 37.5-25 MG PO TABS: 1.0000 | ORAL_TABLET | Freq: Every day | ORAL | Status: DC

## 2011-03-27 MED ORDER — SIMVASTATIN 80 MG PO TABS: 80.0000 mg | ORAL_TABLET | Freq: Every day | ORAL | Status: DC

## 2011-03-27 MED ORDER — METOPROLOL SUCCINATE ER 100 MG PO TB24: 100.0000 mg | ORAL_TABLET | Freq: Every day | ORAL | Status: DC

## 2011-03-27 MED ORDER — HYDROCODONE-ACETAMINOPHEN 5-500 MG PO TABS: 1.0000 | ORAL_TABLET | Freq: Three times a day (TID) | ORAL | Status: DC | PRN

## 2011-03-27 MED ORDER — GLIMEPIRIDE 2 MG PO TABS: 2.0000 mg | ORAL_TABLET | Freq: Every day | ORAL | Status: DC

## 2011-03-27 NOTE — Patient Instructions (Signed)
Today we updated your med list in EPIC...    We refilled your meds per request...  Today we did your follow up blood work...    Please call the PHONE TREE in a few days for your results...    Dial [id number] & when prompted enter your patient number followed by the # symbol...    Your patient number is:  [phone]  Let's get on track w/ our diet & exercise program, the goal is to lose 1-15 lb increments....  Call for any questions...  Let's plan a follow up visit w/ FASTING blood work in about 6 months time.[name] KitchenMarland Kitchen

## 2011-03-27 NOTE — Progress Notes (Signed)
Subjective:    Patient ID: Erika Gates, female    DOB: 1935/02/17, 76 y.o.   MRN: [id number]  HPI 76 y/o BF here for a 4 month follow up visit... she has mult med problems as noted below...   ~  Apr10:  she has had a good 6 mo... CC= allergies, and we discussed strategies for coping w/ all the pollen... ~  Oct10:  she continues to have difficulty w/ diet + exercise- mult excuses & we discussed strategies for weight reduction... otherw feeling OK, takes meds regularly, no new complaints or concerns... sees chiropractor every 6 weeks- "that's all that medicare will pay"...  ~  December 12, 2009:  she is c/o incr "muscle spasms" in her buttocks which, I believe, is really radicular pain from her severe spinal dis w/ known multilevel sp stenosis> prev eval 2009 by DrGioffre & on Rx w/ Etodolac 400mg Bid, VicodinTid, FlexerilTid... she had refused shots & she states that DrG wanted to operate which she didn't agree with... she's been under the care of her Chiropractor & seen Q6wks... she requests Ortho/ Rheum second opinion regarding her pain & we will set this up for her... Unfortunately she has been unable to lose weight & according to our scales is up 15# to 299# today> we discussed diet + exercise program...  ~  June 11, 2010:  she never went to the Rheumatologist (resched for 06/24/10) & has increased her Chiropractor visits to Q2wks...  BP has been controlled on meds;  no CP, palpit, or ch in SOB;  FLP looks good on Simva80 which she tolerates well;  wt is down sl to 285# but FBS=144, & A1c=7.3 on the Glimepiride (rec dose incr to 2mg  Qam)... OK Flu shot today.  ~  March 27, 2011:  62mo ROV & Marnette is up 11# to 296# today, unable to exercise effectively & clearly unsuccessful w/ diet efforts> we discussed wt watchers[facility]...  She saw DrDeveshwar for Rheum w/ series of knee injections (?euflexxa?) for her severe arthritis & she takes Vicodin, Lodine, Flexeril;   BP satis on 3 meds; Chol is fair on Simva80 & she needs better diet all around; similarly her sugar control is fair on Amaryl monotherapy as she has refused Metformin rx;  See prob list below>>            Problem List:    ALLERGIC RHINITIS (ICD-477.9) - on ALLEGRA 180mg /d, & FLONASE Prn...  HYPERTENSION (ICD-401.9) - controlled on TOPROLXL 100mg /d, AMLODIPINE 5mg /d,  and DYAZIDE 1/d.... BP= 140/70 today, must get her weight down!... takes meds regularly and tol well... denies HA, fatigue, visual changes, CP, palipit, dizziness, syncope, edema, etc... pt encouraged to continue weight loss... she doesn't want a stronger fluid pill...  ARTERIOSCLEROTIC HEART DISEASE (ICD-414.00) - on ASA 325mg /d...  S/P CABGx3 8/96 by DrBartle for 3 vessel CAD... she denies CP, palpit etc... still too sedentary & limited by DJD, obesity... ~  NuclearStressTest 5/05 was neg without ischemia & EF=70%...  VENOUS INSUFFICIENCY (ICD-459.81) - she has mod ven insuffic and edema... we discussed low sodium diet, elevation, and support hose... does not want stronger diuretic due to urination...  HYPERCHOLESTEROLEMIA (ICD-272.0) - on SIMVASTATIN 80/d...  ~  FLP 5/08 showed TChol 196, TG 190, HDL 52, LDL 106 ~  FLP 1/09 showed TChol 189, TG 195, HDL 57, LDL 100 ~  FLP 10/09 showed TChol 192, TG 146, HDL 55, LDL 108 ~  FLP  4/10 showed TChol 212, TG 170, HDL 59, LDL 125... rec> same med, take daily, get wt down. ~  FLP 10/10 showed TChol 193, TG 188, HDL 49, LDL 106 ~  FLP 4/11 showed TChol 202, TG 181, HDL 56, LDL 120... not as good w/ recent wt gain. ~  FLP 10/11 showed TChol 170, TG 130, HDL 48, LDL 96  DIABETES MELLITUS (ICD-250.00) - she had the metabolic syndrome x yrs but was unable to lose wt and refused Metformin rx "it made me sad"...  ~  labs 5/08 w/ BS=134 & HgA1c=6.8> therefore started GLIMEPERIDE 1mg /d...  ~  labs 1/09 showed BS= 157, HgA1c= 6.6 ~  labs 5/09 showed BS= 168, HgA1c= 7.0.[name] KitchenMarland Kitchen rec same med, better  diet, get wt down. ~  labs 10/09 showed BS= 152, A1c= 6.7 ~  labs 4/10 showed BS= 160, A1c= 6.8 ~  labs 10/10 showed BS= 179, A1c= 6.9 ~  labs 4/11 showed BS= 143, A1c= 7.0 ~  labs 10/11 showed BS= 144, A1c= 7.3.[name]. rec> incr Glimep to 2mg  Qam. ~  Labs 8/12 showed BS= 137, A1c= 7.2  MORBID OBESITY (ICD-278.00) - ?still goes to the [facility] nutrition center monthly and sees "[facility]"... unfortunately she's been having difficulty losing weight... she is 5' tall, BMI= >>55... she has been well counselled on diet and exercise... [facility]... apparently the handicap spots are still too far away & she can't make the walk to the pool... ~  weight 10/09 = 281# ~  weight 4/10 = 286# ~  weight 10/10 = 284#... we reviewed diet + exercise plan. ~  weight 4/11 = 299#.[name] KitchenMarland Kitchen ?what happened? ~  weight 10/11 = 285# ~  Weight 8/12 = 296#  DEGENERATIVE JOINT DISEASE (ICD-715.90) - on ETODOLAC 400mg Bid + VICODIN Tid Prn... DrGioffre follows w/ discomfort in her L Knee, and back... also sees chiropractor...  LOW BACK PAIN, CHRONIC (ICD-724.2) - on FLEXERIL 10mg Tid Prn... [name] KitchenXRays in 2004 showed mod multilevel spondylosis w/ scoliosis...  "I'm hurting all the time" eval 6/09 by DrGioffre w/ multilevel spinal stenosis at L3-4 L4-5 L5-S1... she refused shots, & refused surg...  she has been seeing DrNazar, a chiropractor, for adjustments every 6 weeks... ~  4/11:  c/o incr buttock pain & refuses to see DrGioffre again... she would like second opinion> Rheum consult pending.  VITAMIN D DEFICIENCY (ICD-268.9) - labs 10/09 showed Vit D level = 25... rec> take Vit D OTC 1000 u daily...  ANXIETY (ICD-300.00) - on ALPRAZOLAM 0.5mg Tid Prn...  ANEMIA, HX OF (ICD-V12.3) ~  labs 4/10 showed Hg= 13.5 ~  labs 4/11 showed Hg= 12.3   Past Surgical History  Procedure Date  . Coronary artery bypass graft     3 vessel- Dr. Laneta Simmers 8/96    Outpatient Encounter  Prescriptions as of 03/27/2011  Medication Sig Dispense Refill  . ALPRAZolam (XANAX) 0.5 MG tablet Take 1 tablet (0.5 mg total) by mouth 3 (three) times daily as needed.  90 tablet  5  . amLODipine (NORVASC) 5 MG tablet TAKE 1 TABLET BY MOUTH DAILY  30 tablet  10  . aspirin 325 MG tablet Take 325 mg by mouth daily.        . Cholecalciferol (VITAMIN D) 1000 UNITS capsule Take 1,000 Units by mouth daily.        . cyclobenzaprine (FLEXERIL) 10 MG tablet TAKE 1 TABLET BY MOUTH 3 TIMES A DAY AS NEEDED FOR MUSCLE SPASMS  90 tablet  7  . etodolac (LODINE) 400 MG tablet TAKE 1 TABLET BY MOUTH 2 TIMES A DAY WITH FOOD AS NEEDED FOR ARTHRITIS PAIN  60 tablet  8  . fluticasone (FLONASE) 50 MCG/ACT nasal spray 2 sprays by Nasal route at bedtime.        [name] glimepiride (AMARYL) 2 MG tablet TAKE 1 TABLET EVERY DAY  30 tablet  6  . HYDROcodone-acetaminophen (VICODIN) 5-500 MG per tablet Take 1 tablet by mouth 3 (three) times daily as needed.        . metoprolol (TOPROL-XL) 100 MG 24 hr tablet TAKE 1 TABLET BY MOUTH DAILY  30 tablet  10  . Multiple Vitamins-Minerals (PROTEGRA CARDIO PO) Take 1 capsule by mouth daily.        . simvastatin (ZOCOR) 80 MG tablet TAKE 1 TABLET BY MOUTH ONCE DAILY  30 tablet  11  . triamterene-hydrochlorothiazide (MAXZIDE-25) 37.5-25 MG per tablet TAKE 1 TABLET BY MOUTH DAILY  30 tablet  8    Allergies  Allergen Reactions  . Metformin     REACTION: pt refuses METFORMIN "it made me sad"...    Current Medications, Allergies, Past Medical History, Past Surgical History, Family History, and Social History were reviewed in [employer] record.    Review of Systems    See HPI - all other systems neg except as noted...  The patient complains of dyspnea on exertion and difficulty walking.  The patient denies anorexia, fever, weight loss, weight gain, vision loss, decreased hearing, hoarseness, chest pain, syncope, peripheral edema, prolonged cough, headaches,  hemoptysis, abdominal pain, melena, hematochezia, severe indigestion/heartburn, hematuria, incontinence, muscle weakness, suspicious skin lesions, transient blindness, depression, unusual weight change, abnormal bleeding, enlarged lymph nodes, and angioedema.   Objective:   Physical Exam     WD, Morbidly Obese, 76 y/o BF in NAD... GENERAL:  Alert & oriented; pleasant & cooperative. HEENT:  [place]/AT, EOM-wnl, PERRLA, EACs-clear, TMs-wnl, NOSE-clear, THROAT-clear & wnl. NECK:  Supple w/ fairROM; no JVD; normal carotid impulses w/o bruits; no thyromegaly or nodules palpated; no lymphadenopathy. CHEST:  Decr BS bilat, clear to P & A; without wheezes/ rales/ or rhonchi heard... HEART:  Regular Rhythm; without murmurs/ rubs/ or gallops detected... ABDOMEN:  Obese soft & nontender, panniculus; normal bowel sounds; no organomegaly or masses palpated... EXT: without deformities, mod arthritic changes; no varicose veins/ +venous insuffic/ 1-2+ edema. BACK:  sl tender over PSIS's... can't lay flat, SLR is painful, symmetric DTR's... NEURO:  CN's intact; no focal neuro deficits... DERM:  No lesions noted; no rash etc...   Assessment & Plan:   HBP>  Controlled on Metoprolol, Amlodipine, & Maxzide-25; but she understands that her BP would be a lot easier to control if she lost a substantial amt of weight; continue same meds for now...  ASHD>  Hx CABGx3 in 1996 but unfortunately has been unable to help herself w/ diet, exercise, wt reduction, etc...  Ven Insuffic>  She knows to avoid sodium, elevate legs, wear support hose, & continue the diuretic...  CHOL>  On Simva80 + diet as noted;  Continue same for now...  DM>  She needs to lose wt!!!  BS/ A1c are acceptable on the Glimepiride monotherapy as she has repeatedly refused to take metformin...  Morbid Obesity>  We reviewed diet plan options for her...  DJD, LBP>  Followed by Rober Minion for Rheum & sees her Chiro every few wks...  Anxiety>  On  Alprazolam as needed.[name] KitchenMarland Kitchen

## 2011-09-17 ENCOUNTER — Telehealth: Payer: Self-pay | Admitting: Pulmonary Disease

## 2011-09-17 NOTE — Telephone Encounter (Signed)
I spoke with pt and is requesting an abx called in prior to her dental surgery in 2 weeks. Pt states she is going to have her teeth removed. She states a friend of hers advised her will need an abx prior to this. Please advise Dr. Kriste Basque, thanks  Allergies  Allergen Reactions  . Metformin     REACTION: pt refuses METFORMIN "it made me sad"...

## 2011-09-17 NOTE — Telephone Encounter (Signed)
Called and spoke with pt and she stated that she is not sure if the dentist will give her abx or not prior to her appt.  She is aware that per SN---ok for her to have abx and take align once daily with this.  Pt is to check with her dentist and see if they are going to give her the abx rx or if we need to call this in for her.  She is to call back with this info prior to her dentist appt.

## 2011-09-18 ENCOUNTER — Telehealth: Payer: Self-pay | Admitting: Pulmonary Disease

## 2011-09-18 NOTE — Telephone Encounter (Signed)
Spoke with pt and notified of recs per TP. Pt verbalized understanding and states nothing further needed.  

## 2011-09-18 NOTE — Telephone Encounter (Signed)
She does not have a heart valve problem You only need antibitoic prophylaxis if you have valve problems prior to dental procedures.  So no abx are indicated.

## 2011-09-18 NOTE — Telephone Encounter (Signed)
I spoke with pt and she states she spoke with her dentist and was advised Dr. Kriste Basque will have to giver her abx.--Per phone note yesterday pt was to check and see if her dentist would do this or not. If not then SN would give her an abx. Please advised Dr. Kriste Basque, thanks  Allergies  Allergen Reactions  . Metformin     REACTION: pt refuses METFORMIN "it made me sad"...

## 2011-09-28 ENCOUNTER — Other Ambulatory Visit: Payer: Self-pay | Admitting: *Deleted

## 2011-09-28 MED ORDER — ALPRAZOLAM 0.5 MG PO TABS: 0.5000 mg | ORAL_TABLET | Freq: Three times a day (TID) | ORAL | Status: DC | PRN

## 2011-10-01 ENCOUNTER — Other Ambulatory Visit (INDEPENDENT_AMBULATORY_CARE_PROVIDER_SITE_OTHER): Payer: Medicare Other

## 2011-10-01 ENCOUNTER — Ambulatory Visit (INDEPENDENT_AMBULATORY_CARE_PROVIDER_SITE_OTHER): Payer: Medicare Other | Admitting: Pulmonary Disease

## 2011-10-01 ENCOUNTER — Ambulatory Visit (INDEPENDENT_AMBULATORY_CARE_PROVIDER_SITE_OTHER)
Admission: RE | Admit: 2011-10-01 | Discharge: 2011-10-01 | Disposition: A | Discharge status: Home / Self Care | Payer: Medicare Other | Source: Ambulatory Visit | Attending: Pulmonary Disease | Admitting: Pulmonary Disease

## 2011-10-01 ENCOUNTER — Encounter: Payer: Self-pay | Admitting: Pulmonary Disease

## 2011-10-01 DIAGNOSIS — M1990 Unspecified osteoarthritis, unspecified site: Secondary | ICD-10-CM

## 2011-10-01 DIAGNOSIS — E119 Type 2 diabetes mellitus without complications: Secondary | ICD-10-CM

## 2011-10-01 DIAGNOSIS — F411 Generalized anxiety disorder: Secondary | ICD-10-CM

## 2011-10-01 DIAGNOSIS — I2510 Atherosclerotic heart disease of native coronary artery without angina pectoris: Secondary | ICD-10-CM

## 2011-10-01 DIAGNOSIS — E559 Vitamin D deficiency, unspecified: Secondary | ICD-10-CM

## 2011-10-01 DIAGNOSIS — I10 Essential (primary) hypertension: Secondary | ICD-10-CM

## 2011-10-01 DIAGNOSIS — I872 Venous insufficiency (chronic) (peripheral): Secondary | ICD-10-CM

## 2011-10-01 DIAGNOSIS — Z862 Personal history of diseases of the blood and blood-forming organs and certain disorders involving the immune mechanism: Secondary | ICD-10-CM

## 2011-10-01 DIAGNOSIS — E7800 Pure hypercholesterolemia, unspecified: Secondary | ICD-10-CM

## 2011-10-01 DIAGNOSIS — M545 Low back pain: Secondary | ICD-10-CM

## 2011-10-01 LAB — LIPID PANEL
Cholesterol: 176 mg/dL (ref 0–200)
Triglycerides: 125 mg/dL (ref 0.0–149.0)

## 2011-10-01 LAB — BASIC METABOLIC PANEL
BUN: 19 mg/dL (ref 6–23)
CO2: 31 mEq/L (ref 19–32)
Chloride: 101 mEq/L (ref 96–112)
Creatinine, Ser: 1.1 mg/dL (ref 0.4–1.2)
Glucose, Bld: 153 mg/dL — ABNORMAL HIGH (ref 70–99)

## 2011-10-01 LAB — CBC WITH DIFFERENTIAL/PLATELET
Basophils Relative: 3.9 % — ABNORMAL HIGH (ref 0.0–3.0)
Eosinophils Absolute: 0.2 10*3/uL (ref 0.0–0.7)
Eosinophils Relative: 4.2 % (ref 0.0–5.0)
HCT: 37.7 % (ref 36.0–46.0)
Hemoglobin: 12.7 g/dL (ref 12.0–15.0)
MCHC: 33.8 g/dL (ref 30.0–36.0)
MCV: 94.2 fl (ref 78.0–100.0)
Monocytes Absolute: 0.5 10*3/uL (ref 0.1–1.0)
Neutro Abs: 3.2 10*3/uL (ref 1.4–7.7)
RBC: 4 Mil/uL (ref 3.87–5.11)
WBC: 5.6 10*3/uL (ref 4.5–10.5)

## 2011-10-01 LAB — HEMOGLOBIN A1C: Hgb A1c MFr Bld: 7.6 % — ABNORMAL HIGH (ref 4.6–6.5)

## 2011-10-01 LAB — HEPATIC FUNCTION PANEL
Bilirubin, Direct: 0.1 mg/dL (ref 0.0–0.3)
Total Bilirubin: 0.5 mg/dL (ref 0.3–1.2)

## 2011-10-01 MED ORDER — ALPRAZOLAM 0.5 MG PO TABS: 0.5000 mg | ORAL_TABLET | Freq: Three times a day (TID) | ORAL | Status: DC | PRN

## 2011-10-01 MED ORDER — ETODOLAC 400 MG PO TABS: 400.0000 mg | ORAL_TABLET | Freq: Two times a day (BID) | ORAL | Status: DC

## 2011-10-01 MED ORDER — HYDROCODONE-ACETAMINOPHEN 5-500 MG PO TABS: 1.0000 | ORAL_TABLET | Freq: Three times a day (TID) | ORAL | Status: DC | PRN

## 2011-10-01 NOTE — Progress Notes (Signed)
Subjective:    Patient ID: Erika Gates, female    DOB: 1935-07-09, 77 y.o.   MRN: [id number]  HPI 77 y/o BF here for a 4 month follow up visit... she has mult med problems as noted below...   ~  December 12, 2009:  she is c/o incr "muscle spasms" in her buttocks which, I believe, is really radicular pain from her severe spinal dis w/ known multilevel sp stenosis> prev eval 2009 by DrGioffre & on Rx w/ Etodolac 400mg Bid, VicodinTid, FlexerilTid... she had refused shots & she states that DrG wanted to operate which she didn't agree with... she's been under the care of her Chiropractor & seen Q6wks... she requests Ortho/ Rheum second opinion regarding her pain & we will set this up for her... Unfortunately she has been unable to lose weight & according to our scales is up 15# to 299# today> we discussed diet + exercise program...  ~  June 11, 2010:  she never went to the Rheumatologist (resched for 06/24/10) & has increased her Chiropractor visits to Q2wks...  BP has been controlled on meds;  no CP, palpit, or ch in SOB;  FLP looks good on Simva80 which she tolerates well;  wt is down sl to 285# but FBS=144, & A1c=7.3 on the Glimepiride (rec dose incr to 2mg  Qam)... OK Flu shot today.  ~  March 27, 2011:  13mo ROV & Nevae is up 11# to 296# today, unable to exercise effectively & clearly unsuccessful w/ diet efforts> we discussed wt watchers, [facility]...  She saw DrDeveshwar for Rheum w/ series of knee injections (?euflexxa?) for her severe arthritis & she takes Vicodin, Lodine, Flexeril;  BP satis on 3 meds; Chol is fair on Simva80 & she needs better diet all around; similarly her sugar control is fair on Amaryl monotherapy as she has refused Metformin rx;  See prob list below>>  ~  October 01, 2011:  8mo ROV & she reports a good interval, states no new complaints or concerns;  NOTE: wt is down 13# on diet to 283# today; She had the seasonal flu vaccine in Nov;   Requests refills for Vicodin & Xanax today...    BP controlled on Metoprolol, Amlodipine, & Dyazide; measures 146/84 today w/ large cuff; denies CP, palpit, dizzy, ch in SOB/DOE or edema (she has VI & chr changes)...    Chol has been under control w/ Simva80 & tol well w/o side effects; FLP today showed TChol 176, TG 125, HDL 56, LDL 95 and LFTs are all wnl...    DM control is fair, intol to Metformin she says & won't re-try this med; on Glimep2mg /d & labs today showed BS=153, A1c=7.6; therefore rec incr Glimep to 4mg /d...    She had f/u DrDeveshwar 1/13 for her DDD, sp stenosis, facet arthropathy, & OA of knees; DrD states she had good relief of back pain w/ Neurontin Rx (1 in AM & 2 in PM); given PT & back brace; given Etodolac to use as needed... CXR 2/13 showed normal heart size & clear lungs, prior CABG & ant eventration of right hemidiaph... LABS 2/13 showed:  FLP- looks good on Simva80;  Chems- wnl x BS=153 A1c=7.6 on Glim2;  CBC- ok;  TSH- ok;  VitD=47            Problem List:    ALLERGIC RHINITIS (ICD-477.9) - on ALLEGRA 180mg /d, & FLONASE Prn...  HYPERTENSION (ICD-401.9) - controlled on TOPROLXL 100mg /d,  AMLODIPINE 5mg /d, & DYAZIDE 1/d.... BP= 146/84 today, must get her weight down!> takes meds regularly and tol well; denies HA, fatigue, visual changes, CP, palipit, dizziness, syncope, edema, etc... pt encouraged to continue weight loss... she doesn't want a stronger fluid pill...  ARTERIOSCLEROTIC HEART DISEASE (ICD-414.00) - on ASA 325mg /d...  S/P CABGx3 8/96 by DrBartle for 3 vessel CAD... she denies CP, palpit etc... still too sedentary & limited by DJD, obesity... ~  NuclearStressTest 5/05 was neg without ischemia & EF=70%...  VENOUS INSUFFICIENCY (ICD-459.81) - she has mod ven insuffic and edema... we discussed low sodium diet, elevation, and support hose... does not want stronger diuretic due to urination...  HYPERCHOLESTEROLEMIA (ICD-272.0) - on SIMVASTATIN 80/d...  ~  FLP 5/08  showed TChol 196, TG 190, HDL 52, LDL 106 ~  FLP 1/09 showed TChol 189, TG 195, HDL 57, LDL 100 ~  FLP 10/09 showed TChol 192, TG 146, HDL 55, LDL 108 ~  FLP 4/10 showed TChol 212, TG 170, HDL 59, LDL 125... rec> same med, take daily, get wt down. ~  FLP 10/10 showed TChol 193, TG 188, HDL 49, LDL 106 ~  FLP 4/11 showed TChol 202, TG 181, HDL 56, LDL 120... not as good w/ recent wt gain. ~  FLP 10/11 on Simva80 showed TChol 170, TG 130, HDL 48, LDL 96 ~  FLP 2/13 on Simva80 showed TChol 176, TG 125, HDL 56, LDL 95 and LFTs are all wnl...   DIABETES MELLITUS (ICD-250.00) - she had the metabolic syndrome x yrs but was unable to lose wt and refused Metformin rx "it made me sad"...  ~  labs 5/08 w/ BS=134 & HgA1c=6.8> therefore started GLIMEPERIDE 1mg /d...  ~  labs 1/09 showed BS= 157, HgA1c= 6.6 ~  labs 5/09 showed BS= 168, HgA1c= 7.0.[name] KitchenMarland Kitchen rec same med, better diet, get wt down. ~  labs 10/09 showed BS= 152, A1c= 6.7 ~  labs 4/10 showed BS= 160, A1c= 6.8 ~  labs 10/10 showed BS= 179, A1c= 6.9 ~  labs 4/11 showed BS= 143, A1c= 7.0 ~  labs 10/11 showed BS= 144, A1c= 7.3.[name]. rec> incr Glimep to 2mg  Qam. ~  Labs 8/12 showed BS= 137, A1c= 7.2 ~  Labs 2/13 on Glim2mg  showed BS=153 A1c=7.6.[name] KitchenMarland Kitchen Rec> incr Glimep4mg /d.  MORBID OBESITY (ICD-278.00) - ?still goes to the [facility] nutrition center monthly and sees "[facility]"... unfortunately she's been having difficulty losing weight... she is 5' tall, BMI= >>55... she has been well counselled on diet and exercise... [facility]... apparently the handicap spots are still too far away & she can't make the walk to the pool... ~  weight 10/09 = 281# ~  weight 4/10 = 286# ~  weight 10/10 = 284#... we reviewed diet + exercise plan. ~  weight 4/11 = 299#.[name] KitchenMarland Kitchen ?what happened? ~  weight 10/11 = 285# ~  Weight 8/12 = 296# ~  Weight 2/13 = 283#  DEGENERATIVE JOINT DISEASE (ICD-715.90) - on ETODOLAC 400mg Bid +  VICODIN Tid Prn... DrGioffre follows w/ discomfort in her L Knee, and back... also sees chiropractor...  LOW BACK PAIN, CHRONIC (ICD-724.2) - on FLEXERIL 10mg Tid Prn... [name] KitchenXRays in 2004 showed mod multilevel spondylosis w/ scoliosis...  "I'm hurting all the time" eval 6/09 by DrGioffre w/ multilevel spinal stenosis at L3-4 L4-5 L5-S1... she refused shots, & refused surg...  she has been seeing DrNazar, a chiropractor, for adjustments every 6 weeks... ~  4/11:  c/o incr  buttock pain & refuses to see DrGioffre again... she would like second opinion> Rheum consult pending.  VITAMIN D DEFICIENCY (ICD-268.9) - labs 10/09 showed Vit D level = 25... rec> take Vit D OTC 1000 u daily...  ANXIETY (ICD-300.00) - on ALPRAZOLAM 0.5mg Tid Prn...  ANEMIA, HX OF (ICD-V12.3) ~  labs 4/10 showed Hg= 13.5 ~  labs 4/11 showed Hg= 12.3 ~  Labs 2/13 showed Hg= 12.7   Past Surgical History  Procedure Date  . Coronary artery bypass graft     3 vessel- Dr. Laneta Simmers 8/96    Outpatient Encounter Prescriptions as of 10/01/2011  Medication Sig Dispense Refill  . ALPRAZolam (XANAX) 0.5 MG tablet Take 1 tablet (0.5 mg total) by mouth 3 (three) times daily as needed.  90 tablet  5  . amLODipine (NORVASC) 5 MG tablet Take 1 tablet (5 mg total) by mouth daily.  30 tablet  11  . aspirin 325 MG tablet Take 325 mg by mouth daily.        . Cholecalciferol (VITAMIN D) 1000 UNITS capsule Take 1,000 Units by mouth daily.        . cyclobenzaprine (FLEXERIL) 10 MG tablet TAKE 1 TABLET BY MOUTH 3 TIMES A DAY AS NEEDED FOR MUSCLE SPASMS  90 tablet  7  . etodolac (LODINE) 400 MG tablet TAKE 1 TABLET BY MOUTH 2 TIMES A DAY WITH FOOD AS NEEDED FOR ARTHRITIS PAIN  60 tablet  8  . fluticasone (FLONASE) 50 MCG/ACT nasal spray Place 2 sprays into the nose at bedtime.  16 g  11  . glimepiride (AMARYL) 2 MG tablet Take 1 tablet (2 mg total) by mouth daily before breakfast.  30 tablet  6  . HYDROcodone-acetaminophen (VICODIN) 5-500 MG per tablet  Take 1 tablet by mouth 3 (three) times daily as needed.  90 tablet  5  . metoprolol (TOPROL-XL) 100 MG 24 hr tablet Take 1 tablet (100 mg total) by mouth daily.  30 tablet  11  . Multiple Vitamins-Minerals (PROTEGRA CARDIO PO) Take 1 capsule by mouth daily.        . simvastatin (ZOCOR) 80 MG tablet Take 1 tablet (80 mg total) by mouth at bedtime.  30 tablet  11  . triamterene-hydrochlorothiazide (MAXZIDE-25) 37.5-25 MG per tablet Take 1 tablet by mouth daily.  30 tablet  11    Allergies  Allergen Reactions  . Metformin     REACTION: pt refuses METFORMIN "it made me sad"...    Current Medications, Allergies, Past Medical History, Past Surgical History, Family History, and Social History were reviewed in [employer] record.    Review of Systems    See HPI - all other systems neg except as noted...  The patient complains of dyspnea on exertion and difficulty walking.  The patient denies anorexia, fever, weight loss, weight gain, vision loss, decreased hearing, hoarseness, chest pain, syncope, peripheral edema, prolonged cough, headaches, hemoptysis, abdominal pain, melena, hematochezia, severe indigestion/heartburn, hematuria, incontinence, muscle weakness, suspicious skin lesions, transient blindness, depression, unusual weight change, abnormal bleeding, enlarged lymph nodes, and angioedema.   Objective:   Physical Exam     WD, Morbidly Obese, 76 y/o BF in NAD... GENERAL:  Alert & oriented; pleasant & cooperative. HEENT:  [place]/AT, EOM-wnl, PERRLA, EACs-clear, TMs-wnl, NOSE-clear, THROAT-clear & wnl. NECK:  Supple w/ fairROM; no JVD; normal carotid impulses w/o bruits; no thyromegaly or nodules palpated; no lymphadenopathy. CHEST:  Decr BS bilat, clear to P & A; without wheezes/ rales/ or rhonchi heard.[name] KitchenMarland Kitchen  HEART:  Regular Rhythm; without murmurs/ rubs/ or gallops detected... ABDOMEN:  Obese soft & nontender, panniculus; normal bowel sounds; no organomegaly or masses  palpated... EXT: without deformities, mod arthritic changes; no varicose veins/ +venous insuffic/ 1-2+ edema. BACK:  sl tender over PSIS's... can't lay flat, SLR is painful, symmetric DTR's... NEURO:  CN's intact; no focal neuro deficits... DERM:  No lesions noted; no rash etc...  RADIOLOGY DATA:  Reviewed in the EPIC EMR & discussed w/ the patient...    >>CXR 2/13 showed normal heart size & clear lungs, prior CABG & ant eventration of right hemidiaph...  LABORATORY DATA:  Reviewed in the EPIC EMR & discussed w/ the patient...    >>LABS 2/13 showed:  FLP- looks good on Simva80;  Chems- wnl x BS=153 A1c=7.6 on Glim2;  CBC- ok;  TSH- ok;  VitD=47   Assessment & Plan:   HBP>  Controlled on Metoprolol, Amlodipine, & Maxzide-25; but she understands that her BP would be a lot easier to control if she lost a substantial amt of weight; continue same meds for now...  ASHD>  Hx CABGx3 in 1996 but unfortunately has been unable to help herself w/ diet, exercise, wt reduction, etc...  Ven Insuffic>  She knows to avoid sodium, elevate legs, wear support hose, & continue the diuretic...  CHOL>  On Simva80 + diet as noted;  Continue same for now...  DM>  She needs to lose wt!!!  BS/ A1c are still not at goal & pt rec to incr Glimep to 4mg /d...  Morbid Obesity>  We reviewed diet plan options for her...  DJD, LBP>  Followed by Rober Minion for Rheum & sees her Chiro every few wks...  Anxiety>  On Alprazolam as needed...   Patient's Medications  New Prescriptions   No medications on file  Previous Medications   AMLODIPINE (NORVASC) 5 MG TABLET    Take 1 tablet (5 mg total) by mouth daily.   ASPIRIN 325 MG TABLET    Take 325 mg by mouth daily.     CHOLECALCIFEROL (VITAMIN D) 1000 UNITS CAPSULE    Take 1,000 Units by mouth daily.     CYCLOBENZAPRINE (FLEXERIL) 10 MG TABLET    TAKE 1 TABLET BY MOUTH 3 TIMES A DAY AS NEEDED FOR MUSCLE SPASMS   FLUTICASONE (FLONASE) 50 MCG/ACT NASAL SPRAY    Place 2  sprays into the nose at bedtime.   GABAPENTIN (NEURONTIN) 100 MG CAPSULE    Take one tablet in the morning and 2 tablets in the evening per Dr. Corliss Skains   METOPROLOL (TOPROL-XL) 100 MG 24 HR TABLET    Take 1 tablet (100 mg total) by mouth daily.   MULTIPLE VITAMINS-MINERALS (PROTEGRA CARDIO PO)    Take 1 capsule by mouth daily.     SIMVASTATIN (ZOCOR) 80 MG TABLET    Take 1 tablet (80 mg total) by mouth at bedtime.   TRIAMTERENE-HYDROCHLOROTHIAZIDE (MAXZIDE-25) 37.5-25 MG PER TABLET    Take 1 tablet by mouth daily.  Modified Medications   Modified Medication Previous Medication   ALPRAZOLAM (XANAX) 0.5 MG TABLET ALPRAZolam (XANAX) 0.5 MG tablet      Take 1 tablet (0.5 mg total) by mouth 3 (three) times daily as needed.    Take 1 tablet (0.5 mg total) by mouth 3 (three) times daily as needed.   ETODOLAC (LODINE) 400 MG TABLET etodolac (LODINE) 400 MG tablet      Take 1 tablet (400 mg total) by mouth 2 (two) times daily. As  needed for arthritis pain    TAKE 1 TABLET BY MOUTH 2 TIMES A DAY WITH FOOD AS NEEDED FOR ARTHRITIS PAIN   GLIMEPIRIDE (AMARYL) 4 MG TABLET glimepiride (AMARYL) 4 MG tablet      Take 1 tablet (4 mg total) by mouth daily before breakfast.    Take 4 mg by mouth daily before breakfast.   HYDROCODONE-ACETAMINOPHEN (VICODIN) 5-500 MG PER TABLET HYDROcodone-acetaminophen (VICODIN) 5-500 MG per tablet      Take 1 tablet by mouth 3 (three) times daily as needed.    Take 1 tablet by mouth 3 (three) times daily as needed.  Discontinued Medications   GLIMEPIRIDE (AMARYL) 2 MG TABLET    Take 1 tablet (2 mg total) by mouth daily before breakfast.

## 2011-10-01 NOTE — Patient Instructions (Signed)
Today we updated your med list in our EPIC system...    Continue your current medications the same...    We refilled the meds you requested...  Today we did your follow up CXR & fasting blood work...    Please call the PHONE TREE in a few days for your results...    Dial [id number] & when prompted enter your patient number followed by the # symbol...    Your patient number is:  [id number]#  Keep up the good work w/ DIET & EXERCISE...    Your weight is down 13#... Keep it up! Way to go! You can do it!!!  Call for any questions...  Let's plan a follow up visit in  6months, sooner if needed for problems.[name] KitchenMarland Kitchen

## 2011-10-05 ENCOUNTER — Other Ambulatory Visit: Payer: Self-pay | Admitting: *Deleted

## 2011-10-05 MED ORDER — GLIMEPIRIDE 4 MG PO TABS: 4.0000 mg | ORAL_TABLET | Freq: Every day | ORAL | Status: DC

## 2011-11-18 ENCOUNTER — Other Ambulatory Visit: Payer: Self-pay | Admitting: Pulmonary Disease

## 2012-02-01 ENCOUNTER — Other Ambulatory Visit: Payer: Self-pay | Admitting: Pulmonary Disease

## 2012-02-04 ENCOUNTER — Telehealth: Payer: Self-pay | Admitting: Pulmonary Disease

## 2012-02-04 NOTE — Telephone Encounter (Signed)
Called and spoke with pt and she is aware that the rx for the flexeril was sent in to the pharmacy earlier this morning.  Pt stated that the pharmacy just called her to let her know the rx was ready.

## 2012-03-16 ENCOUNTER — Other Ambulatory Visit: Payer: Self-pay | Admitting: Pulmonary Disease

## 2012-03-31 ENCOUNTER — Encounter: Payer: Self-pay | Admitting: Pulmonary Disease

## 2012-03-31 ENCOUNTER — Ambulatory Visit (INDEPENDENT_AMBULATORY_CARE_PROVIDER_SITE_OTHER): Payer: Medicare Other | Admitting: Pulmonary Disease

## 2012-03-31 ENCOUNTER — Telehealth: Payer: Self-pay | Admitting: Pulmonary Disease

## 2012-03-31 ENCOUNTER — Other Ambulatory Visit (INDEPENDENT_AMBULATORY_CARE_PROVIDER_SITE_OTHER): Payer: Medicare Other

## 2012-03-31 VITALS — BP 148/78 | HR 95 | Temp 97.0°F | Ht 60.0 in | Wt 289.2 lb

## 2012-03-31 DIAGNOSIS — I10 Essential (primary) hypertension: Secondary | ICD-10-CM

## 2012-03-31 DIAGNOSIS — E119 Type 2 diabetes mellitus without complications: Secondary | ICD-10-CM

## 2012-03-31 DIAGNOSIS — F411 Generalized anxiety disorder: Secondary | ICD-10-CM

## 2012-03-31 DIAGNOSIS — J309 Allergic rhinitis, unspecified: Secondary | ICD-10-CM

## 2012-03-31 DIAGNOSIS — M545 Low back pain: Secondary | ICD-10-CM

## 2012-03-31 DIAGNOSIS — M1990 Unspecified osteoarthritis, unspecified site: Secondary | ICD-10-CM

## 2012-03-31 DIAGNOSIS — E7800 Pure hypercholesterolemia, unspecified: Secondary | ICD-10-CM

## 2012-03-31 DIAGNOSIS — I2510 Atherosclerotic heart disease of native coronary artery without angina pectoris: Secondary | ICD-10-CM

## 2012-03-31 DIAGNOSIS — I872 Venous insufficiency (chronic) (peripheral): Secondary | ICD-10-CM

## 2012-03-31 LAB — HEPATIC FUNCTION PANEL
Bilirubin, Direct: 0.1 mg/dL (ref 0.0–0.3)
Total Bilirubin: 0.7 mg/dL (ref 0.3–1.2)

## 2012-03-31 LAB — LIPID PANEL
HDL: 58.6 mg/dL (ref >39.00)
Total CHOL/HDL Ratio: 3
Triglycerides: 150 mg/dL — ABNORMAL HIGH (ref 0.0–149.0)
VLDL: 30 mg/dL (ref 0.0–40.0)

## 2012-03-31 LAB — BASIC METABOLIC PANEL
BUN: 21 mg/dL (ref 6–23)
Calcium: 9.9 mg/dL (ref 8.4–10.5)
Chloride: 100 mEq/L (ref 96–112)
Creatinine, Ser: 1 mg/dL (ref 0.4–1.2)
GFR: 67.91 mL/min (ref >60.00)

## 2012-03-31 LAB — HEMOGLOBIN A1C: Hgb A1c MFr Bld: 7.1 % — ABNORMAL HIGH (ref 4.6–6.5)

## 2012-03-31 MED ORDER — FLUTICASONE PROPIONATE 50 MCG/ACT NA SUSP: 2.0000 | Freq: Every day | NASAL | Status: DC

## 2012-03-31 MED ORDER — ALPRAZOLAM 0.5 MG PO TABS: 0.5000 mg | ORAL_TABLET | Freq: Three times a day (TID) | ORAL | Status: DC | PRN

## 2012-03-31 NOTE — Progress Notes (Signed)
Subjective:    Patient ID: Erika Gates, female    DOB: 1935-06-16, 77 y.o.   MRN: [id number]  HPI 77 y/o BF here for a 4 month follow up visit... she has mult med problems as noted below...   ~  December 12, 2009:  she is c/o incr "muscle spasms" in her buttocks which, I believe, is really radicular pain from her severe spinal dis w/ known multilevel sp stenosis> prev eval 2009 by DrGioffre & on Rx w/ Etodolac 400mg Bid, VicodinTid, FlexerilTid... she had refused shots & she states that DrG wanted to operate which she didn't agree with... she's been under the care of her Chiropractor & seen Q6wks... she requests Ortho/ Rheum second opinion regarding her pain & we will set this up for her... Unfortunately she has been unable to lose weight & according to our scales is up 15# to 299# today> we discussed diet + exercise program...  ~  June 11, 2010:  she never went to the Rheumatologist (resched for 06/24/10) & has increased her Chiropractor visits to Q2wks...  BP has been controlled on meds;  no CP, palpit, or ch in SOB;  FLP looks good on Simva80 which she tolerates well;  wt is down sl to 285# but FBS=144, & A1c=7.3 on the Glimepiride (rec dose incr to 2mg  Qam)... OK Flu shot today.  ~  March 27, 2011:  2mo ROV & Erika Gates is up 11# to 296# today, unable to exercise effectively & clearly unsuccessful w/ diet efforts> we discussed wt watchers, [facility]...  She saw DrDeveshwar for Rheum w/ series of knee injections (?euflexxa?) for her severe arthritis & she takes Vicodin, Lodine, Flexeril;  BP satis on 3 meds; Chol is fair on Simva80 & she needs better diet all around; similarly her sugar control is fair on Amaryl monotherapy as she has refused Metformin rx;  See prob list below>>  ~  October 01, 2011:  5mo ROV & she reports a good interval, states no new complaints or concerns;  NOTE: wt is down 13# on diet to 283# today; She had the seasonal flu vaccine in Nov;   Requests refills for Vicodin & Xanax today...    BP controlled on Metoprolol, Amlodipine, & Dyazide; measures 146/84 today w/ large cuff; denies CP, palpit, dizzy, ch in SOB/DOE or edema (she has VI & chr changes)...    Chol has been under control w/ Simva80 & tol well w/o side effects; FLP today showed TChol 176, TG 125, HDL 56, LDL 95 and LFTs are all wnl...    DM control is fair, intol to Metformin she says & won't re-try this med; on Glimep2mg /d & labs today showed BS=153, A1c=7.6; therefore rec incr Glimep to 4mg /d...    She had f/u DrDeveshwar 1/13 for her DDD, sp stenosis, facet arthropathy, & OA of knees; DrD states she had good relief of back pain w/ Neurontin Rx (1 in AM & 2 in PM); given PT & back brace; given Etodolac to use as needed... CXR 2/13 showed normal heart size & clear lungs, prior CABG & ant eventration of right hemidiaph... LABS 2/13 showed:  FLP- looks good on Simva80;  Chems- wnl x BS=153 A1c=7.6 on Glim2;  CBC- ok;  TSH- ok;  VitD=47  ~  March 31, 2012:  5mo ROV & Erika Gates didn't bring meds to the office today, she had a med list- it was dated 2010!!! We discussed the importance of bringing all  your meds to every visit... We reviewed prob list, meds, xrays and labs> see below for updates >> LABS 8/13:  FLP- at goals on Simva80;  Chems- ok x BS=133 A1c=7.1 on Glim4...            Problem List:    ALLERGIC RHINITIS (ICD-477.9) - on ALLEGRA 180mg /d, & FLONASE Prn...  HYPERTENSION (ICD-401.9) - controlled on TOPROLXL 100mg /d, AMLODIPINE 5mg /d, & DYAZIDE 1/d....  ~  2/13:  BP= 146/84 & she must get weight down!> takes meds regularly and tol well; denies HA, fatigue, visual changes, CP, palipit, dizziness, syncope, edema, etc; she doesn't want a stronger fluid pill... ~  CXR 2/13 showed normal heart size, s/p CABG, clear lungs w/ ant eventration of right hemidiaph... ~  8/13:  BP= 148/78 & she denies CP, palpit, dizzy, ch in SOB, edema, etc...  ARTERIOSCLEROTIC HEART DISEASE  (ICD-414.00) - on ASA 325mg /d...  S/P CABGx3 8/96 by DrBartle for 3 vessel CAD... she denies CP, palpit etc... still too sedentary & limited by DJD, obesity... ~  NuclearStressTest 5/05 was neg without ischemia & EF=70%...  VENOUS INSUFFICIENCY (ICD-459.81) - she has mod ven insuffic and edema... we discussed low sodium diet, elevation, and support hose... does not want stronger diuretic due to urination...  HYPERCHOLESTEROLEMIA (ICD-272.0) - on SIMVASTATIN 80/d...  ~  FLP 5/08 showed TChol 196, TG 190, HDL 52, LDL 106 ~  FLP 1/09 showed TChol 189, TG 195, HDL 57, LDL 100 ~  FLP 10/09 showed TChol 192, TG 146, HDL 55, LDL 108 ~  FLP 4/10 showed TChol 212, TG 170, HDL 59, LDL 125... rec> same med, take daily, get wt down. ~  FLP 10/10 showed TChol 193, TG 188, HDL 49, LDL 106 ~  FLP 4/11 showed TChol 202, TG 181, HDL 56, LDL 120... not as good w/ recent wt gain. ~  FLP 10/11 on Simva80 showed TChol 170, TG 130, HDL 48, LDL 96 ~  FLP 2/13 on Simva80 showed TChol 176, TG 125, HDL 56, LDL 95 and LFTs are all wnl...  ~  FLP 8/13 on Simva80 showed TChol 171, TG 150, HDL 59, LDL 82  DIABETES MELLITUS (ICD-250.00) - she had the metabolic syndrome x yrs but was unable to lose wt and refused Metformin rx "it made me sad"...  ~  labs 5/08 w/ BS=134 & HgA1c=6.8> therefore started GLIMEPERIDE 1mg /d...  ~  labs 1/09 showed BS= 157, HgA1c= 6.6 ~  labs 5/09 showed BS= 168, HgA1c= 7.0.[name] KitchenMarland Kitchen rec same med, better diet, get wt down. ~  labs 10/09 showed BS= 152, A1c= 6.7 ~  labs 4/10 showed BS= 160, A1c= 6.8 ~  labs 10/10 showed BS= 179, A1c= 6.9 ~  labs 4/11 showed BS= 143, A1c= 7.0 ~  labs 10/11 showed BS= 144, A1c= 7.3.[name]. rec> incr Glimep to 2mg  Qam. ~  Labs 8/12 showed BS= 137, A1c= 7.2 ~  Labs 2/13 on Glim2 showed BS=153 A1c=7.6.[name] KitchenMarland Kitchen Rec> incr Glimep4mg /d. ~  Labs 8/13 on Glim4 showed BS= 133, A1c= 7.1  MORBID OBESITY (ICD-278.00) - ?still goes to the [facility] nutrition center monthly and sees "[facility]"...  unfortunately she's been having difficulty losing weight... she is 5' tall, BMI= >>55... she has been well counselled on diet and exercise... [facility]... apparently the handicap spots are still too far away & she can't make the walk to the pool... ~  weight 10/09 = 281# ~  weight 4/10 =  286# ~  weight 10/10 = 284#... we reviewed diet + exercise plan. ~  weight 4/11 = 299#.[name] KitchenMarland Kitchen ?what happened? ~  weight 10/11 = 285# ~  Weight 8/12 = 296# ~  Weight 2/13 = 283#  RENAL INSUFFIC >> she knows to avoid NSAIDS in excess & we are watching her renal function... ~  Labs 8/14 showed BUN= 17, Creat= 1.4, GFR= 48 ~  Labs 2/13 showed BUN= 19, Creat= 1.1, GFR= 63 ~  DrDeveshwar reported labs 5/13 showed Creat= 1.68, GFR= 48 ~  Labs 8/13 showed BUN= 21, Creat= 1.0, GFR= 68  DEGENERATIVE JOINT DISEASE (ICD-715.90) - on ETODOLAC 400mg Bid + VICODIN Tid Prn... DrGioffre follows w/ discomfort in her L Knee, and back... also sees chiropractor...  GOUT per DrDeveshwar >> pt was started on ULORIC 40mg /d & COLCRYS 1/d by Rober Minion 6/13...  LOW BACK PAIN, CHRONIC (ICD-724.2) - on FLEXERIL 10mg Tid Prn... [name] KitchenXRays in 2004 showed mod multilevel spondylosis w/ scoliosis...  "I'm hurting all the time" eval 6/09 by DrGioffre w/ multilevel spinal stenosis at L3-4 L4-5 L5-S1... she refused shots, & refused surg...  she has been seeing DrNazar, a chiropractor, for adjustments every 6 weeks... ~  4/11:  c/o incr buttock pain & refuses to see DrGioffre again... she would like second opinion> Rheum consult pending.  VITAMIN D DEFICIENCY (ICD-268.9) - labs 10/09 showed Vit D level = 25... rec> take Vit D OTC 1000 u daily...  ANXIETY (ICD-300.00) - on ALPRAZOLAM 0.5mg Tid Prn...  ANEMIA, HX OF (ICD-V12.3) ~  labs 4/10 showed Hg= 13.5 ~  labs 4/11 showed Hg= 12.3 ~  Labs 2/13 showed Hg= 12.7   Past Surgical History  Procedure Date  . Coronary artery bypass graft      3 vessel- Dr. Laneta Simmers 8/96    Outpatient Encounter Prescriptions as of 03/31/2012  Medication Sig Dispense Refill  . ALPRAZolam (XANAX) 0.5 MG tablet Take 1 tablet (0.5 mg total) by mouth 3 (three) times daily as needed.  90 tablet  5  . amLODipine (NORVASC) 5 MG tablet TAKE 1 TABLET (5 MG TOTAL) BY MOUTH DAILY.  30 tablet  2  . aspirin 325 MG tablet Take 325 mg by mouth daily.        . Cholecalciferol (VITAMIN D) 1000 UNITS capsule Take 1,000 Units by mouth daily.        . cyclobenzaprine (FLEXERIL) 10 MG tablet TAKE 1 TABLET BY MOUTH 3 TIMES A DAY AS NEEDED FOR MUSCLE SPASMS  90 tablet  5  . etodolac (LODINE) 400 MG tablet Take 1 tablet (400 mg total) by mouth 2 (two) times daily. As needed for arthritis pain  60 tablet  11  . fluticasone (FLONASE) 50 MCG/ACT nasal spray Place 2 sprays into the nose at bedtime.  16 g  11  . gabapentin (NEURONTIN) 100 MG capsule Take one tablet in the morning and 2 tablets in the evening per Dr. Corliss Skains      . glimepiride (AMARYL) 4 MG tablet Take 1 tablet (4 mg total) by mouth daily before breakfast.  90 tablet  3  . HYDROcodone-acetaminophen (VICODIN) 5-500 MG per tablet Take 1 tablet by mouth 3 (three) times daily as needed.  90 tablet  5  . metoprolol (TOPROL-XL) 100 MG 24 hr tablet Take 1 tablet (100 mg total) by mouth daily.  30 tablet  11  . Multiple Vitamins-Minerals (PROTEGRA CARDIO PO) Take 1 capsule by mouth daily.        . simvastatin (ZOCOR) 80 MG tablet  TAKE 1 TABLET (80 MG TOTAL) BY MOUTH AT BEDTIME.  30 tablet  2  . triamterene-hydrochlorothiazide (MAXZIDE-25) 37.5-25 MG per tablet TAKE 1 TABLET BY MOUTH DAILY.  30 tablet  2  . DISCONTD: glimepiride (AMARYL) 2 MG tablet TAKE 1 TABLET (2 MG TOTAL) BY MOUTH DAILY BEFORE BREAKFAST.  30 tablet  6  . DISCONTD: metoprolol succinate (TOPROL-XL) 100 MG 24 hr tablet TAKE 1 TABLET BY MOUTH DAILY  30 tablet  10    Allergies  Allergen Reactions  . Metformin     REACTION: pt refuses METFORMIN "it made me  sad"...    Current Medications, Allergies, Past Medical History, Past Surgical History, Family History, and Social History were reviewed in [employer] record.    Review of Systems    See HPI - all other systems neg except as noted...  The patient complains of dyspnea on exertion and difficulty walking.  The patient denies anorexia, fever, weight loss, weight gain, vision loss, decreased hearing, hoarseness, chest pain, syncope, peripheral edema, prolonged cough, headaches, hemoptysis, abdominal pain, melena, hematochezia, severe indigestion/heartburn, hematuria, incontinence, muscle weakness, suspicious skin lesions, transient blindness, depression, unusual weight change, abnormal bleeding, enlarged lymph nodes, and angioedema.   Objective:   Physical Exam     WD, Morbidly Obese, 77 y/o BF in NAD... GENERAL:  Alert & oriented; pleasant & cooperative. HEENT:  [place]/AT, EOM-wnl, PERRLA, EACs-clear, TMs-wnl, NOSE-clear, THROAT-clear & wnl. NECK:  Supple w/ fairROM; no JVD; normal carotid impulses w/o bruits; no thyromegaly or nodules palpated; no lymphadenopathy. CHEST:  Decr BS bilat, clear to P & A; without wheezes/ rales/ or rhonchi heard... HEART:  Regular Rhythm; without murmurs/ rubs/ or gallops detected... ABDOMEN:  Obese soft & nontender, panniculus; normal bowel sounds; no organomegaly or masses palpated... EXT: without deformities, mod arthritic changes; no varicose veins/ +venous insuffic/ 1-2+ edema. BACK:  sl tender over PSIS's... can't lay flat, SLR is painful, symmetric DTR's... NEURO:  CN's intact; no focal neuro deficits... DERM:  No lesions noted; no rash etc...  RADIOLOGY DATA:  Reviewed in the EPIC EMR & discussed w/ the patient...    >>CXR 2/13 showed normal heart size & clear lungs, prior CABG & ant eventration of right hemidiaph...  LABORATORY DATA:  Reviewed in the EPIC EMR & discussed w/ the patient...    >>LABS 2/13 showed:  FLP- looks good  on Simva80;  Chems- wnl x BS=153 A1c=7.6 on Glim2;  CBC- ok;  TSH- ok;  VitD=47   Assessment & Plan:    HBP>  Controlled on Metoprolol, Amlodipine, & Maxzide-25; but she understands that her BP would be a lot easier to control if she lost a substantial amt of weight; continue same meds for now...  ASHD>  Hx CABGx3 in 1996 but unfortunately has been unable to help herself w/ diet, exercise, wt reduction, etc...  Ven Insuffic>  She knows to avoid sodium, elevate legs, wear support hose, & continue the diuretic...  CHOL>  On Simva80 + diet as noted;  Continue same for now...  DM>  She needs to lose wt!!!  BS/ A1c are still not at goal & pt rec to incr Glimep to 4mg /d...  Morbid Obesity>  We reviewed diet plan options for her...  DJD, LBP>  Followed by Rober Minion for Rheum & sees her Chiro every few wks...  Anxiety>  On Alprazolam as needed...   Patient's Medications  New Prescriptions   No medications on file  Previous Medications   AMLODIPINE (NORVASC) 5  MG TABLET    TAKE 1 TABLET (5 MG TOTAL) BY MOUTH DAILY.   ASPIRIN 325 MG TABLET    Take 325 mg by mouth daily.     CHOLECALCIFEROL (VITAMIN D) 1000 UNITS CAPSULE    Take 1,000 Units by mouth daily.     CYCLOBENZAPRINE (FLEXERIL) 10 MG TABLET    TAKE 1 TABLET BY MOUTH 3 TIMES A DAY AS NEEDED FOR MUSCLE SPASMS   ETODOLAC (LODINE) 400 MG TABLET    Take 1 tablet (400 mg total) by mouth 2 (two) times daily. As needed for arthritis pain   GABAPENTIN (NEURONTIN) 100 MG CAPSULE    Take one tablet in the morning and 2 tablets in the evening per Dr. Corliss Skains   GLIMEPIRIDE (AMARYL) 4 MG TABLET    Take 1 tablet (4 mg total) by mouth daily before breakfast.   HYDROCODONE-ACETAMINOPHEN (VICODIN) 5-500 MG PER TABLET    Take 1 tablet by mouth 3 (three) times daily as needed.   METOPROLOL (TOPROL-XL) 100 MG 24 HR TABLET    Take 1 tablet (100 mg total) by mouth daily.   MULTIPLE VITAMINS-MINERALS (PROTEGRA CARDIO PO)    Take 1 capsule by mouth  daily.     SIMVASTATIN (ZOCOR) 80 MG TABLET    TAKE 1 TABLET (80 MG TOTAL) BY MOUTH AT BEDTIME.   TRIAMTERENE-HYDROCHLOROTHIAZIDE (MAXZIDE-25) 37.5-25 MG PER TABLET    TAKE 1 TABLET BY MOUTH DAILY.  Modified Medications   Modified Medication Previous Medication   ALPRAZOLAM (XANAX) 0.5 MG TABLET ALPRAZolam (XANAX) 0.5 MG tablet      Take 1 tablet (0.5 mg total) by mouth 3 (three) times daily as needed.    Take 1 tablet (0.5 mg total) by mouth 3 (three) times daily as needed.   FLUTICASONE (FLONASE) 50 MCG/ACT NASAL SPRAY fluticasone (FLONASE) 50 MCG/ACT nasal spray      Place 2 sprays into the nose at bedtime.    Place 2 sprays into the nose at bedtime.  Discontinued Medications   GLIMEPIRIDE (AMARYL) 2 MG TABLET    TAKE 1 TABLET (2 MG TOTAL) BY MOUTH DAILY BEFORE BREAKFAST.   METOPROLOL SUCCINATE (TOPROL-XL) 100 MG 24 HR TABLET    TAKE 1 TABLET BY MOUTH DAILY

## 2012-03-31 NOTE — Telephone Encounter (Signed)
I spoke with Erika Gates and she stated they never received printed rx on pt for her alprazolam. I gave verbal order for this as this was refilled earlier. Will sign off message

## 2012-03-31 NOTE — Patient Instructions (Addendum)
Today we updated your med list in our EPIC system...    Continue your current medications the same...  Today we did your targeted labs- Lipids & Diabetes...    We will call you w/ the results...  Keep up the good work w/ your diet & exercise program...  Call for any questions...  Let's plan another follow up visit in 6 months...    Don't forget to bring all your meds to all your doctor visits.[name] KitchenMarland Kitchen

## 2012-04-26 ENCOUNTER — Other Ambulatory Visit: Payer: Self-pay | Admitting: Pulmonary Disease

## 2012-04-26 MED ORDER — HYDROCODONE-ACETAMINOPHEN 5-325 MG PO TABS: 1.0000 | ORAL_TABLET | Freq: Three times a day (TID) | ORAL | Status: AC | PRN

## 2012-04-26 NOTE — Telephone Encounter (Signed)
Received refill request from [facility] for pt's generic vicodin 5-500mg  TID PRN.  Pt last seen by SN 03-31-12, upcoming 10-07-12.  Generic vicodin 5-500mg  last refilled 10-01-11 #90 with 5 refills.  Per FDA guidelines and SN's standing recs, pt's vicodin 5-500mg  changed to generic norco 5-325mg  TID PRN.  [facility]ic[facility] w/ patient's information including her dob and phone number and SN's state license and DEA numbers; MAR updated.

## 2012-06-07 ENCOUNTER — Telehealth: Payer: Self-pay | Admitting: Pulmonary Disease

## 2012-06-07 MED ORDER — ETODOLAC 400 MG PO TABS: 400.0000 mg | ORAL_TABLET | Freq: Two times a day (BID) | ORAL | Status: DC

## 2012-06-07 NOTE — Telephone Encounter (Signed)
Spoke with the pt to verify the msg Rx was sent to pharm Pt states nothing further needed

## 2012-06-14 ENCOUNTER — Other Ambulatory Visit: Payer: Self-pay | Admitting: Pulmonary Disease

## 2012-06-19 ENCOUNTER — Other Ambulatory Visit: Payer: Self-pay | Admitting: Pulmonary Disease

## 2012-09-17 ENCOUNTER — Other Ambulatory Visit: Payer: Self-pay | Admitting: Pulmonary Disease

## 2012-09-19 ENCOUNTER — Other Ambulatory Visit: Payer: Self-pay | Admitting: Pulmonary Disease

## 2012-10-05 ENCOUNTER — Other Ambulatory Visit: Payer: Self-pay | Admitting: Pulmonary Disease

## 2012-10-07 ENCOUNTER — Ambulatory Visit: Payer: Medicare Other | Admitting: Pulmonary Disease

## 2012-10-14 ENCOUNTER — Other Ambulatory Visit: Payer: Self-pay | Admitting: *Deleted

## 2012-10-14 MED ORDER — SIMVASTATIN 80 MG PO TABS: 80.0000 mg | ORAL_TABLET | Freq: Every day | ORAL | Status: DC

## 2012-10-14 MED ORDER — CYCLOBENZAPRINE HCL 10 MG PO TABS: 10.0000 mg | ORAL_TABLET | Freq: Three times a day (TID) | ORAL | Status: DC | PRN

## 2012-10-14 MED ORDER — METOPROLOL SUCCINATE ER 100 MG PO TB24: 100.0000 mg | ORAL_TABLET | Freq: Every day | ORAL | Status: DC

## 2012-10-14 MED ORDER — TRIAMTERENE-HCTZ 37.5-25 MG PO TABS: 1.0000 | ORAL_TABLET | Freq: Every day | ORAL | Status: DC

## 2012-10-18 ENCOUNTER — Other Ambulatory Visit: Payer: Self-pay | Admitting: *Deleted

## 2012-10-18 MED ORDER — METOPROLOL SUCCINATE ER 100 MG PO TB24: 100.0000 mg | ORAL_TABLET | Freq: Every day | ORAL | Status: DC

## 2012-10-18 NOTE — Telephone Encounter (Signed)
RX sent

## 2012-10-20 ENCOUNTER — Other Ambulatory Visit: Payer: Self-pay | Admitting: *Deleted

## 2012-10-20 MED ORDER — ETODOLAC 400 MG PO TABS: 400.0000 mg | ORAL_TABLET | Freq: Two times a day (BID) | ORAL | Status: DC

## 2012-10-24 ENCOUNTER — Other Ambulatory Visit: Payer: Self-pay | Admitting: Pulmonary Disease

## 2012-10-27 ENCOUNTER — Other Ambulatory Visit: Payer: Self-pay | Admitting: Pulmonary Disease

## 2012-11-29 ENCOUNTER — Ambulatory Visit: Payer: Medicare Other | Admitting: Pulmonary Disease

## 2012-12-07 ENCOUNTER — Other Ambulatory Visit: Payer: Self-pay | Admitting: Pulmonary Disease

## 2012-12-16 ENCOUNTER — Encounter: Payer: Self-pay | Admitting: Pulmonary Disease

## 2012-12-16 ENCOUNTER — Other Ambulatory Visit (INDEPENDENT_AMBULATORY_CARE_PROVIDER_SITE_OTHER): Payer: Medicare Other

## 2012-12-16 ENCOUNTER — Ambulatory Visit (INDEPENDENT_AMBULATORY_CARE_PROVIDER_SITE_OTHER): Payer: Medicare Other | Admitting: Pulmonary Disease

## 2012-12-16 VITALS — BP 138/76 | HR 79 | Temp 97.9°F | Ht 60.0 in | Wt 276.0 lb

## 2012-12-16 DIAGNOSIS — I10 Essential (primary) hypertension: Secondary | ICD-10-CM

## 2012-12-16 DIAGNOSIS — E559 Vitamin D deficiency, unspecified: Secondary | ICD-10-CM

## 2012-12-16 DIAGNOSIS — F411 Generalized anxiety disorder: Secondary | ICD-10-CM

## 2012-12-16 DIAGNOSIS — M109 Gout, unspecified: Secondary | ICD-10-CM | POA: Insufficient documentation

## 2012-12-16 DIAGNOSIS — E7800 Pure hypercholesterolemia, unspecified: Secondary | ICD-10-CM

## 2012-12-16 DIAGNOSIS — M545 Low back pain: Secondary | ICD-10-CM

## 2012-12-16 DIAGNOSIS — J309 Allergic rhinitis, unspecified: Secondary | ICD-10-CM

## 2012-12-16 DIAGNOSIS — E119 Type 2 diabetes mellitus without complications: Secondary | ICD-10-CM

## 2012-12-16 DIAGNOSIS — I2510 Atherosclerotic heart disease of native coronary artery without angina pectoris: Secondary | ICD-10-CM

## 2012-12-16 DIAGNOSIS — I872 Venous insufficiency (chronic) (peripheral): Secondary | ICD-10-CM

## 2012-12-16 DIAGNOSIS — M1990 Unspecified osteoarthritis, unspecified site: Secondary | ICD-10-CM

## 2012-12-16 LAB — CBC WITH DIFFERENTIAL/PLATELET
Basophils Absolute: 0 10*3/uL (ref 0.0–0.1)
Basophils Relative: 0.5 % (ref 0.0–3.0)
Eosinophils Absolute: 0.2 10*3/uL (ref 0.0–0.7)
Hemoglobin: 13.8 g/dL (ref 12.0–15.0)
MCHC: 34.2 g/dL (ref 30.0–36.0)
MCV: 90.1 fl (ref 78.0–100.0)
Monocytes Absolute: 0.5 10*3/uL (ref 0.1–1.0)
Neutro Abs: 3.7 10*3/uL (ref 1.4–7.7)
RBC: 4.49 Mil/uL (ref 3.87–5.11)
RDW: 14.1 % (ref 11.5–14.6)

## 2012-12-16 LAB — BASIC METABOLIC PANEL
CO2: 29 mEq/L (ref 19–32)
Calcium: 9.6 mg/dL (ref 8.4–10.5)
Chloride: 100 mEq/L (ref 96–112)
Creatinine, Ser: 1 mg/dL (ref 0.4–1.2)
Sodium: 138 mEq/L (ref 135–145)

## 2012-12-16 LAB — HEPATIC FUNCTION PANEL
ALT: 25 U/L (ref 0–35)
Alkaline Phosphatase: 84 U/L (ref 39–117)
Bilirubin, Direct: 0.1 mg/dL (ref 0.0–0.3)
Total Bilirubin: 0.6 mg/dL (ref 0.3–1.2)
Total Protein: 7.8 g/dL (ref 6.0–8.3)

## 2012-12-16 LAB — LIPID PANEL
LDL Cholesterol: 99 mg/dL (ref 0–99)
Total CHOL/HDL Ratio: 4
Triglycerides: 165 mg/dL — ABNORMAL HIGH (ref 0.0–149.0)

## 2012-12-16 MED ORDER — ETODOLAC 400 MG PO TABS: 400.0000 mg | ORAL_TABLET | Freq: Two times a day (BID) | ORAL | Status: DC

## 2012-12-16 NOTE — Progress Notes (Signed)
Subjective:    Patient ID: Erika Gates, female    DOB: 20-Aug-1935, 78 y.o.   MRN: [id number]  HPI 78 y/o BF here for a 4 month follow up visit... she has mult med problems as noted below...   ~  March 27, 2011:  60mo ROV & [name] is up 11# to 296# today, unable to exercise effectively & clearly unsuccessful w/ diet efforts> we discussed wt watchers, [facility]...  She saw DrDeveshwar for Rheum w/ series of knee injections (?euflexxa?) for her severe arthritis & she takes Vicodin, Lodine, Flexeril;  BP satis on 3 meds; Chol is fair on Simva80 & she needs better diet all around; similarly her sugar control is fair on Amaryl monotherapy as she has refused Metformin rx;  See prob list below>>  ~  October 01, 2011:  53mo ROV & she reports a good interval, states no new complaints or concerns;  NOTE: wt is down 13# on diet to 283# today; She had the seasonal flu vaccine in Nov;  Requests refills for Vicodin & Xanax today...    BP controlled on Metoprolol, Amlodipine, & Dyazide; measures 146/84 today w/ large cuff; denies CP, palpit, dizzy, ch in SOB/DOE or edema (she has VI & chr changes)...    Chol has been under control w/ Simva80 & tol well w/o side effects; FLP today showed TChol 176, TG 125, HDL 56, LDL 95 and LFTs are all wnl...    DM control is fair, intol to Metformin she says & won't re-try this med; on Glimep2mg /d & labs today showed BS=153, A1c=7.6; therefore rec incr Glimep to 4mg /d...    She had f/u DrDeveshwar 1/13 for her DDD, sp stenosis, facet arthropathy, & OA of knees; DrD states she had good relief of back pain w/ Neurontin Rx (1 in AM & 2 in PM); given PT & back brace; given Etodolac to use as needed... CXR 2/13 showed normal heart size & clear lungs, prior CABG & ant eventration of right hemidiaph... LABS 2/13 showed:  FLP- looks good on Simva80;  Chems- wnl x BS=153 A1c=7.6 on Glim2;  CBC- ok;  TSH- ok;  VitD=47  ~  March 31, 2012:  53mo ROV &  [name] didn't bring meds to the office today, she had a med list- it was dated 2010!!! We discussed the importance of bringing all your meds to every visit... We reviewed prob list, meds, xrays and labs> see below for updates >> LABS 8/13:  FLP- at goals on Simva80;  Chems- ok x BS=133 A1c=7.1 on Glim4...  ~  December 16, 2012:  30mo ROV & [name] notes "same old problems" no new complaints or concerns;  We reviewed the following medical problems during today's office visit >>      HBP> on MetopER100, Amlod5, Dyazide; BP=138/76 w/ large cuff; denies CP, palpit, dizzy, ch in SOB/DOE or edema...    ASHD> on ASA325; hx 3vessel CAD w/ CABG 1996; she denies CP but she is too sedentary & we discussed incr exercise etc...    VI> she knows to elim salt, elev legs, wear support hose (& take the Dyazide)...    CHOL> on Simva80 & tol well w/o side effects; FLP today showed TChol 178, TG 165, HDL 46, LDL 99 and LFTs are all wnl...    DM > on Glimep4 & intol to Metformin she says & won't re-try this med; labs show BS=136, A1c=7.2; needs better diet & get wt  down...    Obesity> wt is down 13# to 276# and we reviewed diet, exercise, wt reduction strategies...    DJD, Gout, LBP> known DDD, sp stenosis, facet arthropathy, & OA of knees; she saw DrDeveshwar 11/13- given bilat knee injections & Rx for Ultram; Uric= 3.8    VitD defic> on VitD 1000u OTC supplement; labs showed VitD level = 38  We reviewed prob list, meds, xrays and labs> see below for updates >> she had Flu shot 11/13... LABS 4/14:  FLP- ok on Simva80 x TG=165;  Chems- ok x BS=136 A1c=7.2;  CBC- wnl;  TSH=2.50;  VitD=38;  Uric=3.8.[name] KitchenMarland Kitchen             Problem List:    ALLERGIC RHINITIS (ICD-477.9) - on ALLEGRA 180mg /d, & FLONASE Prn...  HYPERTENSION (ICD-401.9) - controlled on TOPROLXL 100mg /d, AMLODIPINE 5mg /d, & DYAZIDE 1/d....  ~  2/13:  BP= 146/84 & she must get weight down!> takes meds regularly and tol well; denies HA, fatigue, visual changes, CP,  palipit, dizziness, syncope, edema, etc; she doesn't want a stronger fluid pill... ~  CXR 2/13 showed normal heart size, s/p CABG, clear lungs w/ ant eventration of right hemidiaph... ~  8/13:  BP= 148/78 & she denies CP, palpit, dizzy, ch in SOB, edema, etc... ~  4/14:  on MetopER100, Amlod5, Dyazide; BP=138/76 w/ large cuff; denies CP, palpit, dizzy, ch in SOB/DOE or edema.  ARTERIOSCLEROTIC HEART DISEASE (ICD-414.00) - on ASA 325mg /d...  S/P CABGx3 8/96 by DrBartle for 3 vessel CAD... she denies CP, palpit etc... still too sedentary & limited by DJD, obesity... ~  NuclearStressTest 5/05 was neg without ischemia & EF=70%... ~  on ASA325; hx 3vessel CAD w/ CABG 1996; she denies CP but she is too sedentary & we discussed incr exercise etc  VENOUS INSUFFICIENCY (ICD-459.81) - she has mod ven insuffic and edema... we discussed low sodium diet, elevation, and support hose... does not want stronger diuretic due to urination...  HYPERCHOLESTEROLEMIA (ICD-272.0) - on SIMVASTATIN 80/d...  ~  FLP 5/08 showed TChol 196, TG 190, HDL 52, LDL 106 ~  FLP 1/09 showed TChol 189, TG 195, HDL 57, LDL 100 ~  FLP 10/09 showed TChol 192, TG 146, HDL 55, LDL 108 ~  FLP 4/10 showed TChol 212, TG 170, HDL 59, LDL 125... rec> same med, take daily, get wt down. ~  FLP 10/10 showed TChol 193, TG 188, HDL 49, LDL 106 ~  FLP 4/11 showed TChol 202, TG 181, HDL 56, LDL 120... not as good w/ recent wt gain. ~  FLP 10/11 on Simva80 showed TChol 170, TG 130, HDL 48, LDL 96 ~  FLP 2/13 on Simva80 showed TChol 176, TG 125, HDL 56, LDL 95 and LFTs are all wnl...  ~  FLP 8/13 on Simva80 showed TChol 171, TG 150, HDL 59, LDL 82 ~  FLP 4/14 on Simva80 showed TChol 178, TG 165, HDL 46, LDL 99  DIABETES MELLITUS (ICD-250.00) - she had the metabolic syndrome x yrs but was unable to lose wt and refused Metformin rx "it made me sad"...  ~  labs 5/08 w/ BS=134 & HgA1c=6.8> therefore started GLIMEPERIDE 1mg /d...  ~  labs 1/09 showed BS=  157, HgA1c= 6.6 ~  labs 5/09 showed BS= 168, HgA1c= 7.0.[name] KitchenMarland Kitchen rec same med, better diet, get wt down. ~  labs 10/09 showed BS= 152, A1c= 6.7 ~  labs 4/10 showed BS= 160, A1c= 6.8 ~  labs 10/10 showed BS= 179, A1c= 6.9 ~  labs 4/11 showed BS= 143, A1c= 7.0 ~  labs 10/11 showed BS= 144, A1c= 7.3.[name]. rec> incr Glimep to 2mg  Qam. ~  Labs 8/12 showed BS= 137, A1c= 7.2 ~  Labs 2/13 on Glim2 showed BS=153 A1c=7.6.[name] KitchenMarland Kitchen Rec> incr Glimep4mg /d. ~  Labs 8/13 on Glim4 showed BS= 133, A1c= 7.1 ~  Labs 4/14 on Glim4 showed BS=136, A1c= 7.2  MORBID OBESITY (ICD-278.00) - ?still goes to the [facility] nutrition center monthly and sees "[facility]"... unfortunately she's been having difficulty losing weight... she is 5' tall, BMI= >>55... she has been well counselled on diet and exercise... [facility]... apparently the handicap spots are still too far away & she can't make the walk to the pool... ~  weight 10/09 = 281# ~  weight 4/10 = 286# ~  weight 10/10 = 284#... we reviewed diet + exercise plan. ~  weight 4/11 = 299#.[name] KitchenMarland Kitchen ?what happened? ~  weight 10/11 = 285# ~  Weight 8/12 = 296# ~  Weight 2/13 = 283# ~  Weight 4/14 = 276#  RENAL INSUFFIC >> she knows to avoid NSAIDS in excess & we are watching her renal function... ~  Labs 8/14 showed BUN= 17, Creat= 1.4, GFR= 48 ~  Labs 2/13 showed BUN= 19, Creat= 1.1, GFR= 63 ~  DrDeveshwar reported labs 5/13 showed Creat= 1.68, GFR= 48 ~  Labs 8/13 showed BUN= 21, Creat= 1.0, GFR= 68 ~  Labs 4/14 showed BUN= 22, Creat= 1.0, GFR= 71  DEGENERATIVE JOINT DISEASE (ICD-715.90) - on ETODOLAC 400mg Bid + VICODIN Tid Prn... DrGioffre follows w/ discomfort in her L Knee, and back... also sees chiropractor...  GOUT per DrDeveshwar >> pt was started on ULORIC 40mg /d & COLCRYS 1/d by Rober Minion 6/13, but she subseq stopped on her own... ~  Labs 4/14 showed Uric = 3.8  LOW BACK PAIN, CHRONIC (ICD-724.2) - on FLEXERIL 10mg Tid  Prn... [name] KitchenXRays in 2004 showed mod multilevel spondylosis w/ scoliosis...  "I'm hurting all the time" eval 6/09 by DrGioffre w/ multilevel spinal stenosis at L3-4 L4-5 L5-S1... she refused shots, & refused surg...  she has been seeing DrNazar, a chiropractor, for adjustments every 6 weeks... ~  4/11:  c/o incr buttock pain & refuses to see DrGioffre again... she would like second opinion> Rheum consult DrDeveshwar...  VITAMIN D DEFICIENCY (ICD-268.9) >>  ~  Labs 10/09 showed Vit D level = 25... rec> take Vit D OTC 1000 u daily... ~  Labs 4/11 showed Vit D level = 42 ~  Labs 2/13 showed Vit D level = 47 ~  Labs 4/14 showed Vit D level = 38  ANXIETY (ICD-300.00) - on ALPRAZOLAM 0.5mg Tid Prn...  ANEMIA, HX OF (ICD-V12.3) ~  labs 4/10 showed Hg= 13.5 ~  labs 4/11 showed Hg= 12.3 ~  Labs 2/13 showed Hg= 12.7 ~  Labs 4/14 showed Hg= 13.8   Past Surgical History  Procedure Laterality Date  . Coronary artery bypass graft      3 vessel- Dr. Laneta Simmers 8/96    Outpatient Encounter Prescriptions as of 12/16/2012  Medication Sig Dispense Refill  . ALPRAZolam (XANAX) 0.5 MG tablet TAKE 1 TABLET BY MOUTH 3 TIMES A DAY AS NEEDED FOR NERVES  90 tablet  1  . amLODipine (NORVASC) 5 MG tablet TAKE 1 TABLET (5 MG TOTAL) BY MOUTH DAILY.  30 tablet  5  . aspirin 325 MG tablet Take 325 mg by mouth daily.        [name]  Cholecalciferol (VITAMIN D) 1000 UNITS capsule Take 1,000 Units by mouth daily.        . cyclobenzaprine (FLEXERIL) 10 MG tablet Take 1 tablet (10 mg total) by mouth 3 (three) times daily as needed for muscle spasms.  90 tablet  2  . etodolac (LODINE) 400 MG tablet Take 1 tablet (400 mg total) by mouth 2 (two) times daily. As needed for arthritis pain  60 tablet  5  . fluticasone (FLONASE) 50 MCG/ACT nasal spray Place 2 sprays into the nose at bedtime.  16 g  11  . gabapentin (NEURONTIN) 100 MG capsule Take one tablet in the morning and 2 tablets in the evening per Dr. Corliss Skains      . glimepiride  (AMARYL) 4 MG tablet TAKE 1 TABLET BY MOUTH EVERY DAY BEFORE BREAKFAST  90 tablet  2  . metoprolol (TOPROL-XL) 100 MG 24 hr tablet Take 1 tablet (100 mg total) by mouth daily.  30 tablet  11  . Multiple Vitamins-Minerals (PROTEGRA CARDIO PO) Take 1 capsule by mouth daily.        . simvastatin (ZOCOR) 80 MG tablet TAKE 1 TABLET (80 MG TOTAL) BY MOUTH AT BEDTIME.  30 tablet  2  . traMADol (ULTRAM) 50 MG tablet Take 1-2 tablets by mouth two times daily as needed      . triamterene-hydrochlorothiazide (MAXZIDE-25) 37.5-25 MG per tablet TAKE 1 TABLET BY MOUTH DAILY.  30 tablet  2  . [DISCONTINUED] amLODipine (NORVASC) 5 MG tablet TAKE 1 TABLET (5 MG TOTAL) BY MOUTH DAILY.  30 tablet  2  . [DISCONTINUED] etodolac (LODINE) 400 MG tablet TAKE 1 TABLET (400 MG TOTAL) BY MOUTH 2 (TWO) TIMES DAILY. AS NEEDED FOR ARTHRITIS PAIN  60 tablet  0  . [DISCONTINUED] metoprolol succinate (TOPROL-XL) 100 MG 24 hr tablet Take 1 tablet (100 mg total) by mouth daily. Take with or immediately following a meal.  90 tablet  1  . [DISCONTINUED] simvastatin (ZOCOR) 80 MG tablet Take 1 tablet (80 mg total) by mouth at bedtime.  30 tablet  2  . [DISCONTINUED] triamterene-hydrochlorothiazide (MAXZIDE-25) 37.5-25 MG per tablet Take 1 each (1 tablet total) by mouth daily.  30 tablet  2   No facility-administered encounter medications on file as of 12/16/2012.    Allergies  Allergen Reactions  . Metformin     REACTION: pt refuses METFORMIN "it made me sad"...    Current Medications, Allergies, Past Medical History, Past Surgical History, Family History, and Social History were reviewed in [employer] record.    Review of Systems    See HPI - all other systems neg except as noted...  The patient complains of dyspnea on exertion and difficulty walking.  The patient denies anorexia, fever, weight loss, weight gain, vision loss, decreased hearing, hoarseness, chest pain, syncope, peripheral edema,  prolonged cough, headaches, hemoptysis, abdominal pain, melena, hematochezia, severe indigestion/heartburn, hematuria, incontinence, muscle weakness, suspicious skin lesions, transient blindness, depression, unusual weight change, abnormal bleeding, enlarged lymph nodes, and angioedema.   Objective:   Physical Exam     WD, Morbidly Obese, 77 y/o BF in NAD... GENERAL:  Alert & oriented; pleasant & cooperative. HEENT:  Energy/AT, EOM-wnl, PERRLA, EACs-clear, TMs-wnl, NOSE-clear, THROAT-clear & wnl. NECK:  Supple w/ fairROM; no JVD; normal carotid impulses w/o bruits; no thyromegaly or nodules palpated; no lymphadenopathy. CHEST:  Decr BS bilat, clear to P & A; without wheezes/ rales/ or rhonchi heard... HEART:  Regular Rhythm; without murmurs/ rubs/ or gallops  detected... ABDOMEN:  Obese soft & nontender, panniculus; normal bowel sounds; no organomegaly or masses palpated... EXT: without deformities, mod arthritic changes; no varicose veins/ +venous insuffic/ 1-2+ edema. BACK:  sl tender over PSIS's... can't lay flat, SLR is painful, symmetric DTR's... NEURO:  CN's intact; no focal neuro deficits... DERM:  No lesions noted; no rash etc...  RADIOLOGY DATA:  Reviewed in the EPIC EMR & discussed w/ the patient...    >>CXR 2/13 showed normal heart size & clear lungs, prior CABG & ant eventration of right hemidiaph...  LABORATORY DATA:  Reviewed in the EPIC EMR & discussed w/ the patient...    >>LABS 2/13 showed:  FLP- looks good on Simva80;  Chems- wnl x BS=153 A1c=7.6 on Glim2;  CBC- ok;  TSH- ok;  VitD=47   Assessment & Plan:    HBP>  Controlled on Metoprolol, Amlodipine, & Maxzide-25; but she understands that her BP would be a lot easier to control if she lost a substantial amt of weight; continue same meds for now...  ASHD>  Hx CABGx3 in 1996 but unfortunately has been unable to help herself w/ diet, exercise, wt reduction, etc...  Ven Insuffic>  She knows to avoid sodium, elevate legs,  wear support hose, & continue the diuretic...  CHOL>  On Simva80 + diet as noted;  Continue same for now...  DM>  She needs to lose wt!!!  BS/ A1c are still not at goal but continue same meds...  Morbid Obesity>  We reviewed diet plan options for her...  DJD, LBP>  Followed by Rober Minion for Rheum & sees her Chiro every few wks...  Anxiety>  On Alprazolam as needed...   Patient's Medications  New Prescriptions   No medications on file  Previous Medications   ALPRAZOLAM (XANAX) 0.5 MG TABLET    TAKE 1 TABLET BY MOUTH 3 TIMES A DAY AS NEEDED FOR NERVES   AMLODIPINE (NORVASC) 5 MG TABLET    TAKE 1 TABLET (5 MG TOTAL) BY MOUTH DAILY.   ASPIRIN 325 MG TABLET    Take 325 mg by mouth daily.     CHOLECALCIFEROL (VITAMIN D) 1000 UNITS CAPSULE    Take 1,000 Units by mouth daily.     CYCLOBENZAPRINE (FLEXERIL) 10 MG TABLET    Take 1 tablet (10 mg total) by mouth 3 (three) times daily as needed for muscle spasms.   FLUTICASONE (FLONASE) 50 MCG/ACT NASAL SPRAY    Place 2 sprays into the nose at bedtime.   GABAPENTIN (NEURONTIN) 100 MG CAPSULE    Take one tablet in the morning and 2 tablets in the evening per Dr. Corliss Skains   GLIMEPIRIDE (AMARYL) 4 MG TABLET    TAKE 1 TABLET BY MOUTH EVERY DAY BEFORE BREAKFAST   METOPROLOL (TOPROL-XL) 100 MG 24 HR TABLET    Take 1 tablet (100 mg total) by mouth daily.   MULTIPLE VITAMINS-MINERALS (PROTEGRA CARDIO PO)    Take 1 capsule by mouth daily.     SIMVASTATIN (ZOCOR) 80 MG TABLET    TAKE 1 TABLET (80 MG TOTAL) BY MOUTH AT BEDTIME.   TRAMADOL (ULTRAM) 50 MG TABLET    Take 1-2 tablets by mouth two times daily as needed   TRIAMTERENE-HYDROCHLOROTHIAZIDE (MAXZIDE-25) 37.5-25 MG PER TABLET    TAKE 1 TABLET BY MOUTH DAILY.  Modified Medications   Modified Medication Previous Medication   ETODOLAC (LODINE) 400 MG TABLET etodolac (LODINE) 400 MG tablet      Take 1 tablet (400 mg total) by mouth 2 (  two) times daily. As needed for arthritis pain    Take 1 tablet (400  mg total) by mouth 2 (two) times daily. As needed for arthritis pain  Discontinued Medications   AMLODIPINE (NORVASC) 5 MG TABLET    TAKE 1 TABLET (5 MG TOTAL) BY MOUTH DAILY.   ETODOLAC (LODINE) 400 MG TABLET    TAKE 1 TABLET (400 MG TOTAL) BY MOUTH 2 (TWO) TIMES DAILY. AS NEEDED FOR ARTHRITIS PAIN   METOPROLOL SUCCINATE (TOPROL-XL) 100 MG 24 HR TABLET    Take 1 tablet (100 mg total) by mouth daily. Take with or immediately following a meal.   SIMVASTATIN (ZOCOR) 80 MG TABLET    Take 1 tablet (80 mg total) by mouth at bedtime.   TRIAMTERENE-HYDROCHLOROTHIAZIDE (MAXZIDE-25) 37.5-25 MG PER TABLET    Take 1 each (1 tablet total) by mouth daily.

## 2012-12-16 NOTE — Patient Instructions (Addendum)
Today we updated your med list in our EPIC system...    Continue your current medications the same...    We refilled the meds you requested...  Today we did your follow up FASTING blood work...    We will contact you w/ the results when available...   Keep up the good work w/ your DIET & EXERCISE program...  Call for any questions...  Let's plan a follow up visit in 73mo, sooner if needed for problems.[name] KitchenMarland Kitchen

## 2013-01-14 ENCOUNTER — Other Ambulatory Visit: Payer: Self-pay | Admitting: Pulmonary Disease

## 2013-01-20 ENCOUNTER — Other Ambulatory Visit: Payer: Self-pay | Admitting: Pulmonary Disease

## 2013-03-20 ENCOUNTER — Telehealth: Payer: Self-pay | Admitting: Pulmonary Disease

## 2013-03-20 MED ORDER — ALPRAZOLAM 0.5 MG PO TABS: ORAL_TABLET | ORAL | Status: DC

## 2013-03-20 NOTE — Telephone Encounter (Signed)
Refill called into pharmacy. Cristan Scherzer, CMA  

## 2013-05-21 ENCOUNTER — Other Ambulatory Visit: Payer: Self-pay | Admitting: Pulmonary Disease

## 2013-06-15 ENCOUNTER — Other Ambulatory Visit: Payer: Self-pay | Admitting: Pulmonary Disease

## 2013-06-21 ENCOUNTER — Ambulatory Visit (INDEPENDENT_AMBULATORY_CARE_PROVIDER_SITE_OTHER): Payer: Medicare Other | Admitting: Pulmonary Disease

## 2013-06-21 ENCOUNTER — Encounter: Payer: Self-pay | Admitting: Pulmonary Disease

## 2013-06-21 ENCOUNTER — Other Ambulatory Visit (INDEPENDENT_AMBULATORY_CARE_PROVIDER_SITE_OTHER): Payer: Medicare Other

## 2013-06-21 VITALS — BP 138/84 | HR 75 | Temp 98.3°F | Ht 60.0 in | Wt 277.0 lb

## 2013-06-21 DIAGNOSIS — F411 Generalized anxiety disorder: Secondary | ICD-10-CM

## 2013-06-21 DIAGNOSIS — E559 Vitamin D deficiency, unspecified: Secondary | ICD-10-CM

## 2013-06-21 DIAGNOSIS — I872 Venous insufficiency (chronic) (peripheral): Secondary | ICD-10-CM

## 2013-06-21 DIAGNOSIS — M545 Low back pain: Secondary | ICD-10-CM

## 2013-06-21 DIAGNOSIS — M1990 Unspecified osteoarthritis, unspecified site: Secondary | ICD-10-CM

## 2013-06-21 DIAGNOSIS — E119 Type 2 diabetes mellitus without complications: Secondary | ICD-10-CM

## 2013-06-21 DIAGNOSIS — I2510 Atherosclerotic heart disease of native coronary artery without angina pectoris: Secondary | ICD-10-CM

## 2013-06-21 DIAGNOSIS — M109 Gout, unspecified: Secondary | ICD-10-CM

## 2013-06-21 DIAGNOSIS — E7800 Pure hypercholesterolemia, unspecified: Secondary | ICD-10-CM

## 2013-06-21 DIAGNOSIS — I10 Essential (primary) hypertension: Secondary | ICD-10-CM

## 2013-06-21 LAB — BASIC METABOLIC PANEL
Calcium: 10.2 mg/dL (ref 8.4–10.5)
GFR: 79.22 mL/min (ref >60.00)
Glucose, Bld: 130 mg/dL — ABNORMAL HIGH (ref 70–99)
Sodium: 138 mEq/L (ref 135–145)

## 2013-06-21 NOTE — Patient Instructions (Signed)
Today we updated your med list in our EPIC system...    Continue your current medications the same...  Today we did your follow up DM labs...    We will contact you w/ the results when available...   Let's get on track w/ our diet 7 exerice program...    The goal is to lose that 1st 10-15 lbs!!!  Call for any questions...  Let's plan a follow up visit in 81mo w/ FASTING blood work at that visit.[name] KitchenMarland Kitchen

## 2013-06-21 NOTE — Progress Notes (Signed)
Subjective:    Patient ID: Erika Gates, female    DOB: 07/05/1935, 78 y.o.   MRN: [id number]  HPI 78 y/o BF here for a 4 month follow up visit... she has mult med problems as noted below...   ~  October 01, 2011:  39mo ROV & she reports a good interval, states no new complaints or concerns;  NOTE: wt is down 13# on diet to 283# today; She had the seasonal flu vaccine in Nov;  Requests refills for Vicodin & Xanax today...    BP controlled on Metoprolol, Amlodipine, & Dyazide; measures 146/84 today w/ large cuff; denies CP, palpit, dizzy, ch in SOB/DOE or edema (she has VI & chr changes)...    Chol has been under control w/ Simva80 & tol well w/o side effects; FLP today showed TChol 176, TG 125, HDL 56, LDL 95 and LFTs are all wnl...    DM control is fair, intol to Metformin she says & won't re-try this med; on Glimep2mg /d & labs today showed BS=153, A1c=7.6; therefore rec incr Glimep to 4mg /d...    She had f/u DrDeveshwar 1/13 for her DDD, sp stenosis, facet arthropathy, & OA of knees; DrD states she had good relief of back pain w/ Neurontin Rx (1 in AM & 2 in PM); given PT & back brace; given Etodolac to use as needed...  CXR 2/13 showed normal heart size & clear lungs, prior CABG & ant eventration of right hemidiaph...  LABS 2/13 showed:  FLP- looks good on Simva80;  Chems- wnl x BS=153 A1c=7.6 on Glim2;  CBC- ok;  TSH- ok;  VitD=47  ~  March 31, 2012:  39mo ROV & Erika Gates didn't bring meds to the office today, she had a med list- it was dated 2010!!! We discussed the importance of bringing all your meds to every visit... We reviewed prob list, meds, xrays and labs> see below for updates >>  LABS 8/13:  FLP- at goals on Simva80;  Chems- ok x BS=133 A1c=7.1 on Glim4...  ~  December 16, 2012:  87mo ROV & Erika Gates notes "same old problems" no new complaints or concerns;  We reviewed the following medical problems during today's office visit >>     HBP> on MetopER100, Amlod5, Dyazide; BP=138/76 w/  large cuff; denies CP, palpit, dizzy, ch in SOB/DOE or edema...    ASHD> on ASA325; hx 3vessel CAD w/ CABG 1996; she denies CP but she is too sedentary & we discussed incr exercise etc...    VI> she knows to elim salt, elev legs, wear support hose (& take the Dyazide)...    CHOL> on Simva80 & tol well w/o side effects; FLP today showed TChol 178, TG 165, HDL 46, LDL 99 and LFTs are all wnl...    DM > on Glimep4 & intol to Metformin she says & won't re-try this med; labs show BS=136, A1c=7.2; needs better diet & get wt down...    Obesity> wt is down 13# to 276# and we reviewed diet, exercise, wt reduction strategies...    DJD, Gout, LBP> known DDD, sp stenosis, facet arthropathy, & OA of knees; she saw DrDeveshwar 11/13- given bilat knee injections & Rx for Ultram; Uric= 3.8    VitD defic> on VitD 1000u OTC supplement; labs showed VitD level = 38 We reviewed prob list, meds, xrays and labs> see below for updates >> she had Flu shot 11/13...  LABS 4/14:  FLP- ok on Simva80 x TG=165;  Chems- ok x BS=136  A1c=7.2;  CBC- wnl;  TSH=2.50;  VitD=38;  Uric=3.8.[name].  ~  June 21, 2013:  16mo ROV & Erika Gates's CC is her arthritis- esp knees and she reports "gel" injected into them by DrDeveshwar (helped a little, ambulates w/ cane, uses Tramadol prn); We reviewed the following medical problems during today's office visit >>     HBP> on MetopER100, Amlod5, Dyazide; BP=138/84 w/ large cuff; denies CP, palpit, dizzy, ch in SOB/DOE or edema...    ASHD> on ASA325; hx 3vessel CAD w/ CABG 1996; she denies CP but she is too sedentary & we discussed incr exercise etc...    VI> she knows to elim salt, elev legs, wear support hose (& take the Dyazide)...    CHOL> on Simva80 & tol well w/o side effects; FLP 4/14 showed TChol 178, TG 165, HDL 46, LDL 99 and LFTs are all wnl...    DM > on Glimep4 & intol to Metformin she says & won't re-try this med; labs 10/14 showed BS=130, A1c=7.4; needs better diet & exerc, we decided to ADD  TRAJENTA5...    Morbid Obesity> wt is stable at 277# (BMI>50) and she has a huge panniculus; we reviewed diet, exercise, wt reduction strategies...    DJD, Gout, LBP> known DDD, sp stenosis, facet arthropathy, & OA of knees; followed by DrDeveshwar- given bilat knee injections & Rx for Ultram; Uric= 3.8    VitD defic> on VitD 1000u OTC supplement; labs showed VitD level = 38 We reviewed prob list, meds, xrays and labs> see below for updates >> she had the 2014 Flu vaccine earlier this month...   LABS 10/14:  Chems- ok x BS=130, A1c=7.4 => Trajenta5 added to her Glimep4 (she refuses Metform)...             Problem List:    ALLERGIC RHINITIS (ICD-477.9) - on ALLEGRA 180mg /d, & FLONASE Prn...  HYPERTENSION (ICD-401.9) - controlled on TOPROLXL 100mg /d, AMLODIPINE 5mg /d, & DYAZIDE 1/d....  ~  2/13:  BP= 146/84 & she must get weight down!> takes meds regularly and tol well; denies HA, fatigue, visual changes, CP, palipit, dizziness, syncope, edema, etc; she doesn't want a stronger fluid pill... ~  CXR 2/13 showed normal heart size, s/p CABG, clear lungs w/ ant eventration of right hemidiaph... ~  8/13:  BP= 148/78 & she denies CP, palpit, dizzy, ch in SOB, edema, etc... ~  4/14:  on MetopER100, Amlod5, Dyazide; BP=138/76 w/ large cuff; denies CP, palpit, dizzy, ch in SOB/DOE or edema. ~  10/14: on MetopER100, Amlod5, Dyazide; BP=138/84 w/ large cuff; she remains largely asymptomatic except for her obesity...  ARTERIOSCLEROTIC HEART DISEASE (ICD-414.00) - on ASA 325mg /d...  S/P CABGx3 8/96 by DrBartle for 3 vessel CAD... she denies CP, palpit etc... still too sedentary & limited by DJD, obesity... ~  NuclearStressTest 5/05 was neg without ischemia & EF=70%... ~  on ASA325; hx 3vessel CAD w/ CABG 1996; she denies CP but she is too sedentary & we discussed incr exercise etc  VENOUS INSUFFICIENCY (ICD-459.81) - she has mod ven insuffic and edema... we discussed low sodium diet, elevation, and support  hose... does not want stronger diuretic due to urination...  HYPERCHOLESTEROLEMIA (ICD-272.0) - on SIMVASTATIN 80/d...  ~  FLP 5/08 showed TChol 196, TG 190, HDL 52, LDL 106 ~  FLP 1/09 showed TChol 189, TG 195, HDL 57, LDL 100 ~  FLP 10/09 showed TChol 192, TG 146, HDL 55, LDL 108 ~  FLP 4/10 showed TChol 212, TG 170, HDL 59,  LDL 125... rec> same med, take daily, get wt down. ~  FLP 10/10 showed TChol 193, TG 188, HDL 49, LDL 106 ~  FLP 4/11 showed TChol 202, TG 181, HDL 56, LDL 120... not as good w/ recent wt gain. ~  FLP 10/11 on Simva80 showed TChol 170, TG 130, HDL 48, LDL 96 ~  FLP 2/13 on Simva80 showed TChol 176, TG 125, HDL 56, LDL 95 and LFTs are all wnl...  ~  FLP 8/13 on Simva80 showed TChol 171, TG 150, HDL 59, LDL 82 ~  FLP 4/14 on Simva80 showed TChol 178, TG 165, HDL 46, LDL 99  DIABETES MELLITUS (ICD-250.00) - she had the metabolic syndrome x yrs but was unable to lose wt and refused Metformin rx "it made me sad"...  ~  labs 5/08 w/ BS=134 & HgA1c=6.8> therefore started GLIMEPERIDE 1mg /d...  ~  labs 1/09 showed BS= 157, HgA1c= 6.6 ~  labs 5/09 showed BS= 168, HgA1c= 7.0.[name] KitchenMarland Kitchen rec same med, better diet, get wt down. ~  labs 10/09 showed BS= 152, A1c= 6.7 ~  labs 4/10 showed BS= 160, A1c= 6.8 ~  labs 10/10 showed BS= 179, A1c= 6.9 ~  labs 4/11 showed BS= 143, A1c= 7.0 ~  labs 10/11 showed BS= 144, A1c= 7.3.[name]. rec> incr Glimep to 2mg  Qam. ~  Labs 8/12 showed BS= 137, A1c= 7.2 ~  Labs 2/13 on Glim2 showed BS=153 A1c=7.6.[name] KitchenMarland Kitchen Rec> incr Glimep4mg /d. ~  Labs 8/13 on Glim4 showed BS= 133, A1c= 7.1 ~  Labs 4/14 on Glim4 showed BS=136, A1c= 7.2 ~  Labs 10/14 on Glim4 showed BS=130, A1c=7.4... She refuses Metform, we decided to add Trajenta5  MORBID OBESITY (ICD-278.00) - ?still goes [facility] and sees "Maggie May"... unfortunately she's been having difficulty losing weight... she is 5' tall, BMI= >>55... she has been well counselled on diet and exercise... [facility]  [facility] apparently the handicap spots are still too far away & she can't make the walk to the pool... ~  weight 10/09 = 281# ~  weight 4/10 = 286# ~  weight 10/10 = 284#... we reviewed diet + exercise plan. ~  weight 4/11 = 299#.[name] KitchenMarland Kitchen ?what happened? ~  weight 10/11 = 285# ~  Weight 8/12 = 296# ~  Weight 2/13 = 283# ~  Weight 4/14 = 276# ~  Weight 10/14 = 277#  HYx of RENAL INSUFFIC >> she knows to avoid NSAIDS in excess & we are watching her renal function... ~  Labs 8/14 showed BUN= 17, Creat= 1.4, GFR= 48 ~  Labs 2/13 showed BUN= 19, Creat= 1.1, GFR= 63 ~  DrDeveshwar reported labs 5/13 showed Creat= 1.68, GFR= 48 ~  Labs 8/13 showed BUN= 21, Creat= 1.0, GFR= 68 ~  Labs 4/14 showed BUN= 22, Creat= 1.0, GFR= 71 ~  Labs 10/14 showed BUN= 16, Creat= 0.9  DEGENERATIVE JOINT DISEASE (ICD-715.90) - on ETODOLAC 400mg Bid + VICODIN Tid Prn... DrGioffre follows w/ discomfort in her L Knee, and back... also sees chiropractor...  GOUT per DrDeveshwar >> pt was started on ULORIC 40mg /d & COLCRYS 1/d by Rober Minion 6/13, but she subseq stopped on her own... ~  Labs 4/14 showed Uric = 3.8  LOW BACK PAIN, CHRONIC (ICD-724.2) - on FLEXERIL 10mg Tid Prn... [name] KitchenXRays in 2004 showed mod multilevel spondylosis w/ scoliosis...  "I'm hurting all the time" eval 6/09 by DrGioffre w/ multilevel spinal stenosis at L3-4 L4-5 L5-S1... she refused  shots, & refused surg...  she has been seeing DrNazar, a chiropractor, for adjustments every 6 weeks... ~  4/11:  c/o incr buttock pain & refuses to see DrGioffre again... she would like second opinion> Rheum consult DrDeveshwar...  VITAMIN D DEFICIENCY (ICD-268.9) >>  ~  Labs 10/09 showed Vit D level = 25... rec> take Vit D OTC 1000 u daily... ~  Labs 4/11 showed Vit D level = 42 ~  Labs 2/13 showed Vit D level = 47 ~  Labs 4/14 showed Vit D level = 38  ANXIETY (ICD-300.00) - on ALPRAZOLAM 0.5mg Tid  Prn...  ANEMIA, HX OF (ICD-V12.3) ~  labs 4/10 showed Hg= 13.5 ~  labs 4/11 showed Hg= 12.3 ~  Labs 2/13 showed Hg= 12.7 ~  Labs 4/14 showed Hg= 13.8   Past Surgical History  Procedure Laterality Date  . Coronary artery bypass graft      3 vessel- Dr. Laneta Simmers 8/96    Outpatient Encounter Prescriptions as of 06/21/2013  Medication Sig Dispense Refill  . ALPRAZolam (XANAX) 0.5 MG tablet TAKE 1 TABLET BY MOUTH 3 TIMES A DAY AS NEEDED FOR NERVES  90 tablet  5  . amLODipine (NORVASC) 5 MG tablet TAKE 1 TABLET (5 MG TOTAL) BY MOUTH DAILY.  30 tablet  5  . aspirin 325 MG tablet Take 325 mg by mouth daily.        . Cholecalciferol (VITAMIN D) 1000 UNITS capsule Take 1,000 Units by mouth daily.        . cyclobenzaprine (FLEXERIL) 10 MG tablet TAKE 1 TABLET (10 MG TOTAL) BY MOUTH 3 (THREE) TIMES DAILY AS NEEDED FOR MUSCLE SPASMS.  90 tablet  2  . fluticasone (FLONASE) 50 MCG/ACT nasal spray Place 2 sprays into the nose at bedtime.  16 g  11  . gabapentin (NEURONTIN) 100 MG capsule Take one tablet in the morning and 2 tablets in the evening per Dr. Corliss Skains      . glimepiride (AMARYL) 4 MG tablet TAKE 1 TABLET BY MOUTH EVERY DAY BEFORE BREAKFAST  90 tablet  2  . metoprolol (TOPROL-XL) 100 MG 24 hr tablet Take 1 tablet (100 mg total) by mouth daily.  30 tablet  11  . Multiple Vitamins-Minerals (PROTEGRA CARDIO PO) Take 1 capsule by mouth daily.        . simvastatin (ZOCOR) 80 MG tablet TAKE 1 TABLET (80 MG TOTAL) BY MOUTH AT BEDTIME.  30 tablet  2  . traMADol (ULTRAM) 50 MG tablet Take 1-2 tablets by mouth two times daily as needed      . triamterene-hydrochlorothiazide (MAXZIDE-25) 37.5-25 MG per tablet TAKE 1 TABLET BY MOUTH DAILY.  30 tablet  2   No facility-administered encounter medications on file as of 06/21/2013.    Allergies  Allergen Reactions  . Metformin     REACTION: pt refuses METFORMIN "it made me sad"...    Current Medications, Allergies, Past Medical History, Past  Surgical History, Family History, and Social History were reviewed in [employer] record.    Review of Systems    See HPI - all other systems neg except as noted...  The patient complains of dyspnea on exertion and difficulty walking.  The patient denies anorexia, fever, weight loss, weight gain, vision loss, decreased hearing, hoarseness, chest pain, syncope, peripheral edema, prolonged cough, headaches, hemoptysis, abdominal pain, melena, hematochezia, severe indigestion/heartburn, hematuria, incontinence, muscle weakness, suspicious skin lesions, transient blindness, depression, unusual weight change, abnormal bleeding, enlarged lymph nodes, and angioedema.  Objective:   Physical Exam     WD, Morbidly Obese, 78 y/o BF in NAD... GENERAL:  Alert & oriented; pleasant & cooperative. HEENT:  Erika Gates/AT, EOM-wnl, PERRLA, EACs-clear, TMs-wnl, NOSE-clear, THROAT-clear & wnl. NECK:  Supple w/ fairROM; no JVD; normal carotid impulses w/o bruits; no thyromegaly or nodules palpated; no lymphadenopathy. CHEST:  Decr BS bilat, clear to P & A; without wheezes/ rales/ or rhonchi heard... HEART:  Regular Rhythm; without murmurs/ rubs/ or gallops detected... ABDOMEN:  Obese soft & nontender, panniculus; normal bowel sounds; no organomegaly or masses palpated... EXT: without deformities, mod arthritic changes; no varicose veins/ +venous insuffic/ 1-2+ edema. BACK:  sl tender over PSIS's... can't lay flat, SLR is painful, symmetric DTR's... NEURO:  CN's intact; no focal neuro deficits... DERM:  No lesions noted; no rash etc...  RADIOLOGY DATA:  Reviewed in the EPIC EMR & discussed w/ the patient...   LABORATORY DATA:  Reviewed in the EPIC EMR & discussed w/ the patient...   Assessment & Plan:    HBP>  Controlled on Metoprolol, Amlodipine, & Maxzide-25; but she understands that her BP would be a lot easier to control if she lost a substantial amt of weight; continue same meds for  now...  ASHD>  Hx CABGx3 in 1996 but unfortunately has been unable to help herself w/ diet, exercise, wt reduction, etc...  Ven Insuffic>  She knows to avoid sodium, elevate legs, wear support hose, & continue the diuretic...  CHOL>  On Simva80 + diet as noted;  Continue same for now...  DM>  She needs to lose wt!!!  BS/ A1c are still not at goal w/ A1c creaping up to 7.4 on her Glimep4mg /d monotherapy; we will add Trajenta5...   Morbid Obesity>  We reviewed diet plan options for her...  DJD, LBP>  Followed by Rober Minion for Rheum & sees her Chiro every few wks...  Anxiety>  On Alprazolam as needed.[name] KitchenMarland Kitchen

## 2013-06-23 ENCOUNTER — Other Ambulatory Visit: Payer: Self-pay | Admitting: Pulmonary Disease

## 2013-06-23 ENCOUNTER — Telehealth: Payer: Self-pay | Admitting: Pulmonary Disease

## 2013-06-23 MED ORDER — AMLODIPINE BESYLATE 5 MG PO TABS: ORAL_TABLET | ORAL | Status: DC

## 2013-06-23 MED ORDER — LINAGLIPTIN 5 MG PO TABS: 5.0000 mg | ORAL_TABLET | Freq: Every day | ORAL | Status: DC

## 2013-06-23 NOTE — Telephone Encounter (Signed)
Called and spoke with pt and she is requesting a 90 day supply for the amlodipine.  This has been sent to her pharmacy and nothing further is needed.

## 2013-06-28 ENCOUNTER — Telehealth: Payer: Self-pay | Admitting: Pulmonary Disease

## 2013-06-28 NOTE — Telephone Encounter (Signed)
I called and spoke with [facility]. Was advised tadjenta is not covered by insurance. Was not giving alternatives. Please advise SN thanks  Allergies  Allergen Reactions  . Metformin     REACTION: pt refuses METFORMIN "it made me sad"...

## 2013-06-29 ENCOUNTER — Telehealth: Payer: Self-pay | Admitting: Pulmonary Disease

## 2013-06-29 MED ORDER — SAXAGLIPTIN HCL 5 MG PO TABS: 5.0000 mg | ORAL_TABLET | Freq: Every day | ORAL | Status: DC

## 2013-06-29 NOTE — Telephone Encounter (Signed)
Per SN---  Change the tradjenta to onglyza 5 mg  1 daily with prn refills.  Ask her to get a copy of her prescription drug formulary for reference.  She will need to get this for next year as well.  thanks

## 2013-06-29 NOTE — Telephone Encounter (Signed)
[facility].  Spoke with pt and advised to obtain copy of drug formulary for her records.

## 2013-06-29 NOTE — Telephone Encounter (Signed)
I spoke with pt. She reports she called her insurance and was told they did not have a "copy of her formulary" but was giving a number to get the DM medication approved. I advised pt we had called in onglyza for her instead. She voiced her understanding. i called the pharmacy to see if pt does cover this. I was advised they do and her copay would be $40. Most medications like this cost over $200. Nothing further needed

## 2013-07-13 ENCOUNTER — Telehealth: Payer: Self-pay | Admitting: Pulmonary Disease

## 2013-07-13 MED ORDER — GLIMEPIRIDE 4 MG PO TABS: ORAL_TABLET | ORAL | Status: DC

## 2013-07-13 NOTE — Telephone Encounter (Signed)
I spoke with Erika Gates. She needed her amaryl sent to the pharmacy. I advised will do. She also stated if she needed a glucose machine since she lives alone. Per leigh Erika Gates can get OTC or we can send in a rx to see if insurance will cover. Erika Gates wants to check the price first. Nothing further needed

## 2013-07-24 ENCOUNTER — Telehealth: Payer: Self-pay | Admitting: Pulmonary Disease

## 2013-07-24 NOTE — Telephone Encounter (Signed)
I called and spoke with pt. She reports she has been feeling dizzy and legs feels weak. She thinks this is d/t the onglyza or the amaryl. Pt is requesting recs. Please advise SN thanks  Allergies  Allergen Reactions  . Metformin     REACTION: pt refuses METFORMIN "it made me sad"...

## 2013-07-24 NOTE — Telephone Encounter (Signed)
Per SN---  Stop onglyza Continue her other 2 meds Better low carb diet Work on weight reduction

## 2013-07-24 NOTE — Telephone Encounter (Signed)
Pt advised. Jennifer Castillo, CMA  

## 2013-09-23 ENCOUNTER — Other Ambulatory Visit: Payer: Self-pay | Admitting: Pulmonary Disease

## 2013-12-11 ENCOUNTER — Telehealth: Payer: Self-pay | Admitting: Pulmonary Disease

## 2013-12-11 MED ORDER — SIMVASTATIN 80 MG PO TABS: ORAL_TABLET | ORAL | Status: DC

## 2013-12-11 MED ORDER — GLIMEPIRIDE 4 MG PO TABS: ORAL_TABLET | ORAL | Status: DC

## 2013-12-11 MED ORDER — ALPRAZOLAM 0.5 MG PO TABS: ORAL_TABLET | ORAL | Status: DC

## 2013-12-11 MED ORDER — METOPROLOL SUCCINATE ER 100 MG PO TB24: 100.0000 mg | ORAL_TABLET | Freq: Every day | ORAL | Status: DC

## 2013-12-11 MED ORDER — AMLODIPINE BESYLATE 5 MG PO TABS: ORAL_TABLET | ORAL | Status: DC

## 2013-12-11 NOTE — Telephone Encounter (Signed)
Pt states that she is still working on getting set up with new PCP.  She needs refills on Glimeperide, zocor, alprazolam, metoprolol, amlodipine until appt is made.  Refills sent.

## 2013-12-21 ENCOUNTER — Other Ambulatory Visit: Payer: Self-pay | Admitting: Pulmonary Disease

## 2013-12-25 ENCOUNTER — Ambulatory Visit: Payer: Medicare Other | Admitting: Pulmonary Disease

## 2014-05-01 ENCOUNTER — Other Ambulatory Visit: Payer: Self-pay | Admitting: Pulmonary Disease

## 2014-06-15 ENCOUNTER — Other Ambulatory Visit: Payer: Self-pay | Admitting: Pulmonary Disease

## 2014-07-13 ENCOUNTER — Other Ambulatory Visit: Payer: Self-pay | Admitting: Pulmonary Disease

## 2014-09-24 ENCOUNTER — Other Ambulatory Visit: Payer: Self-pay | Admitting: Pulmonary Disease

## 2014-10-10 ENCOUNTER — Other Ambulatory Visit: Payer: Self-pay | Admitting: Pulmonary Disease

## 2014-12-24 ENCOUNTER — Other Ambulatory Visit: Payer: Self-pay | Admitting: Pulmonary Disease

## 2016-05-27 ENCOUNTER — Ambulatory Visit (INDEPENDENT_AMBULATORY_CARE_PROVIDER_SITE_OTHER): Payer: Self-pay | Admitting: Rheumatology

## 2016-05-27 ENCOUNTER — Ambulatory Visit (INDEPENDENT_AMBULATORY_CARE_PROVIDER_SITE_OTHER): Payer: Medicare Other | Admitting: Rheumatology

## 2016-05-27 DIAGNOSIS — M25561 Pain in right knee: Secondary | ICD-10-CM

## 2016-05-27 DIAGNOSIS — M5137 Other intervertebral disc degeneration, lumbosacral region: Secondary | ICD-10-CM

## 2016-05-27 DIAGNOSIS — M25571 Pain in right ankle and joints of right foot: Secondary | ICD-10-CM

## 2016-08-27 ENCOUNTER — Other Ambulatory Visit: Payer: Self-pay | Admitting: *Deleted

## 2016-08-27 MED ORDER — TRAMADOL HCL 50 MG PO TABS: ORAL_TABLET | ORAL | 0 refills | Status: DC

## 2016-08-27 NOTE — Telephone Encounter (Signed)
Refill request received via fax for Tramadol  Last Visit: 05/27/16 Next Visit due April 2018. Message sent to the front to schedule patient.  UDS: 09/21/15 Narc Agreement: 06/09/15  Spoke with patient and advised she needs to come to update Narc Agreement and UDS. Patient will be in next week.   Okay to refill  30 day supply of Tramadol?

## 2016-09-14 ENCOUNTER — Other Ambulatory Visit: Payer: Self-pay | Admitting: Radiology

## 2016-09-14 DIAGNOSIS — Z5181 Encounter for therapeutic drug level monitoring: Secondary | ICD-10-CM

## 2016-09-17 LAB — PAIN MGMT, PROFILE 5 W/CONF, U
ALPHAHYDROXYMIDAZOLAM: NEGATIVE ng/mL (ref <50)
ALPHAHYDROXYTRIAZOLAM: NEGATIVE ng/mL (ref <50)
AMINOCLONAZEPAM: NEGATIVE ng/mL (ref <25)
AMPHETAMINES: NEGATIVE ng/mL (ref <500)
Alphahydroxyalprazolam: 141 ng/mL — ABNORMAL HIGH (ref <25)
BARBITURATES: NEGATIVE ng/mL (ref <300)
BENZODIAZEPINES: POSITIVE ng/mL — AB (ref <100)
Cocaine Metabolite: NEGATIVE ng/mL (ref <150)
Creatinine: 52.2 mg/dL (ref >=20.0)
Hydroxyethylflurazepam: NEGATIVE ng/mL (ref <50)
Lorazepam: NEGATIVE ng/mL (ref <50)
METHADONE METABOLITE: NEGATIVE ng/mL (ref <100)
Marijuana Metabolite: NEGATIVE ng/mL (ref <20)
NORDIAZEPAM: NEGATIVE ng/mL (ref <50)
OXYCODONE: NEGATIVE ng/mL (ref <100)
Opiates: NEGATIVE ng/mL (ref <100)
Oxazepam: NEGATIVE ng/mL (ref <50)
Oxidant: NEGATIVE ug/mL (ref <200)
TEMAZEPAM: NEGATIVE ng/mL (ref <50)
pH: 7.3 (ref 4.5–9.0)

## 2016-09-17 NOTE — Progress Notes (Signed)
UDS consistent

## 2016-12-29 ENCOUNTER — Telehealth: Payer: Self-pay | Admitting: *Deleted

## 2016-12-29 NOTE — Telephone Encounter (Signed)
NOTES SENT TO SCHEDULING.  °

## 2017-01-05 ENCOUNTER — Telehealth: Payer: Self-pay | Admitting: Cardiovascular Disease

## 2017-01-05 NOTE — Telephone Encounter (Signed)
Received records from [facility] for appointment on 01/08/17 with Dr Allyson SabalBerry.  Records put with Dr Hazle CocaBerry's schedule for 01/08/17. lp

## 2017-01-08 ENCOUNTER — Ambulatory Visit (INDEPENDENT_AMBULATORY_CARE_PROVIDER_SITE_OTHER): Payer: Medicare Other | Admitting: Cardiovascular Disease

## 2017-01-08 ENCOUNTER — Encounter: Payer: Self-pay | Admitting: Cardiovascular Disease

## 2017-01-08 VITALS — BP 178/76 | HR 78 | Ht 60.0 in | Wt 257.0 lb

## 2017-01-08 DIAGNOSIS — I2510 Atherosclerotic heart disease of native coronary artery without angina pectoris: Secondary | ICD-10-CM

## 2017-01-08 DIAGNOSIS — I10 Essential (primary) hypertension: Secondary | ICD-10-CM

## 2017-01-08 DIAGNOSIS — E7800 Pure hypercholesterolemia, unspecified: Secondary | ICD-10-CM

## 2017-01-08 NOTE — Assessment & Plan Note (Signed)
History of CAD status post coronary artery bypass grafting 3 by Dr. Laneta SimmersBartle approximately 20 years ago. She is currently asymptomatic and has not had any cardiovascular issues since that time.

## 2017-01-08 NOTE — Progress Notes (Signed)
01/08/2017 Erika DanasMyrtle V Gates   Sep 24, 1934  [id number]  Primary Physician Marletta LorBarr, Julie, NP Primary Cardiologist: Runell GessJonathan J Berry MD Roseanne RenoFACP, FACC, FAHA, FSCAI  HPI:  Erika Gates is a very pleasant 82 year old severely overweight widowed African-American female mother of 3 daughters, grandmother of 3 grandchildren was accompanied by her daughter Erika DandyMary today. She was referred back to me to be established in our practice. I last saw her approximately 20 years ago when she had her bypass surgery. She had coronary artery bypass grafting 3 by Dr. Laneta SimmersBartle at that time. She has done well since. Other problems include treated hypertension, diabetes and hyperlipidemia. She has chronic shortness of breath but denies chest pain.   Current Outpatient Prescriptions  Medication Sig Dispense Refill  . ALPRAZolam (XANAX) 0.5 MG tablet TAKE 1 TABLET BY MOUTH 3 TIMES A DAY AS NEEDED FOR NERVES 90 tablet 2  . amLODipine (NORVASC) 5 MG tablet TAKE 1 TABLET (5 MG TOTAL) BY MOUTH DAILY. 30 tablet 3  . aspirin 325 MG tablet Take 325 mg by mouth daily.      . Cholecalciferol (VITAMIN D) 1000 UNITS capsule Take 1,000 Units by mouth daily.      . cyclobenzaprine (FLEXERIL) 10 MG tablet TAKE 1 TABLET (10 MG TOTAL) BY MOUTH 3 (THREE) TIMES DAILY AS NEEDED FOR MUSCLE SPASMS. 90 tablet 0  . diclofenac sodium (VOLTAREN) 1 % GEL Apply topically 4 (four) times daily.    . fluticasone (FLONASE) 50 MCG/ACT nasal spray Place 2 sprays into the nose at bedtime. 16 g 11  . gabapentin (NEURONTIN) 100 MG capsule Take one tablet in the morning and 2 tablets in the evening per Dr. Corliss Skainseveshwar    . glimepiride (AMARYL) 4 MG tablet TAKE 1 TABLET BY MOUTH EVERY DAY BEFORE BREAKFAST 90 tablet 1  . metoprolol succinate (TOPROL-XL) 100 MG 24 hr tablet Take 1 tablet (100 mg total) by mouth daily. 30 tablet 3  . Multiple Vitamins-Minerals (PROTEGRA CARDIO PO) Take 1 capsule by mouth daily.      . simvastatin (ZOCOR) 80 MG tablet TAKE 1 TABLET (80  MG TOTAL) BY MOUTH AT BEDTIME. 30 tablet 2  . traMADol (ULTRAM) 50 MG tablet Take 1-2 tablets by mouth twice daily as needed 120 tablet 0  . triamterene-hydrochlorothiazide (MAXZIDE-25) 37.5-25 MG per tablet TAKE 1 TABLET BY MOUTH DAILY. 30 tablet 2   No current facility-administered medications for this visit.     Allergies  Allergen Reactions  . Metformin Other (See Comments)    REACTION: pt refuses METFORMIN "it made me sad"...    Social History   Social History  . Marital status: Widowed    Spouse name: N/A  . Number of children: N/A  . Years of education: N/A   Occupational History  . retired    Social History Main Topics  . Smoking status: Never Smoker  . Smokeless tobacco: Never Used  . Alcohol use No  . Drug use: No  . Sexual activity: Not on file   Other Topics Concern  . Not on file   Social History Narrative  . No narrative on file     Review of Systems: General: negative for chills, fever, night sweats or weight changes.  Cardiovascular: negative for chest pain, dyspnea on exertion, edema, orthopnea, palpitations, paroxysmal nocturnal dyspnea or shortness of breath Dermatological: negative for rash Respiratory: negative for cough or wheezing Urologic: negative for hematuria Abdominal: negative for nausea, vomiting, diarrhea, bright red blood per rectum, melena, or  hematemesis Neurologic: negative for visual changes, syncope, or dizziness All other systems reviewed and are otherwise negative except as noted above.    Blood pressure (!) 178/76, pulse 78, height 5' (1.524 m), weight 257 lb (116.6 kg), SpO2 97 %.  General appearance: alert and no distress Neck: no adenopathy, no carotid bruit, no JVD, supple, symmetrical, trachea midline and thyroid not enlarged, symmetric, no tenderness/mass/nodules Lungs: clear to auscultation bilaterally Heart: regular rate and rhythm, S1, S2 normal, no murmur, click, rub or gallop Extremities: extremities normal,  atraumatic, no cyanosis or edema  EKG sinus rhythm at 81 with nonspecific ST and T-wave changes. I personally reviewed this EKG.  ASSESSMENT AND PLAN:   Essential hypertension History of essential hypertension blood pressure measured today at 178/76. He is on metoprolol and amlodipine as well as Maxide. This is being managed by her PCP.  Coronary atherosclerosis History of CAD status post coronary artery bypass grafting 3 by Dr. Laneta Simmers approximately 20 years ago. She is currently asymptomatic and has not had any cardiovascular issues since that time.  HYPERCHOLESTEROLEMIA History of hyperlipidemia on statin therapy with recent lipid profile performed by her PCP 12/28/16 revealed total cholesterol 180, LDL 90 and HDL 69.      Runell Gess MD [facility], [facility] 01/08/2017 3:27 PM

## 2017-01-08 NOTE — Patient Instructions (Signed)

## 2017-01-08 NOTE — Assessment & Plan Note (Signed)
History of hyperlipidemia on statin therapy with recent lipid profile performed by her PCP 12/28/16 revealed total cholesterol 180, LDL 90 and HDL 69.

## 2017-01-08 NOTE — Assessment & Plan Note (Signed)
History of essential hypertension blood pressure measured today at 178/76. He is on metoprolol and amlodipine as well as Maxide. This is being managed by her PCP.

## 2017-02-13 ENCOUNTER — Other Ambulatory Visit: Payer: Self-pay | Admitting: Rheumatology

## 2017-02-15 NOTE — Telephone Encounter (Signed)
ok 

## 2017-02-15 NOTE — Telephone Encounter (Signed)
Last Visit: 05/27/16 Next Visit due April 2018. Message sent to the front to schedule patient.  UDS: 09/14/16 Narc Agreement: 06/09/15  Attempted to contact the patient about updating narcotic agreement.   Okay to refill  30 day supply of Tramadol?

## 2017-03-12 DIAGNOSIS — M170 Bilateral primary osteoarthritis of knee: Secondary | ICD-10-CM | POA: Insufficient documentation

## 2017-03-12 DIAGNOSIS — Z8679 Personal history of other diseases of the circulatory system: Secondary | ICD-10-CM | POA: Insufficient documentation

## 2017-03-12 DIAGNOSIS — M5136 Other intervertebral disc degeneration, lumbar region: Secondary | ICD-10-CM | POA: Insufficient documentation

## 2017-03-12 NOTE — Progress Notes (Signed)
Office Visit Note  Patient: Erika Gates             Date of Birth: 04/30/1935           MRN: [id number]             PCP: Marletta Lor, NP Referring: Marletta Lor, NP Visit Date: 03/18/2017 Occupation: @GUAROCC @    Subjective:  Osteoarthritis (Doing good, difficulty walking and standing)   History of Present Illness: Erika Gates is a 82 y.o. female with osteoarthritis and this disease of lumbar spine. She continues to have pain and discomfort in her knee joints. She denies any joint swelling. She has constant pain in her lower back. She added gabapentin recently to get some pain relief. Tried all helps her with the pain management.  Activities of Daily Living:  Patient reports morning stiffness for 15 minutes.   Patient Reports nocturnal pain.  Difficulty dressing/grooming: Reports Difficulty climbing stairs: Reports Difficulty getting out of chair: Reports Difficulty using hands for taps, buttons, cutlery, and/or writing: Denies   Review of Systems  Constitutional: Positive for fatigue. Negative for night sweats, weight gain, weight loss and weakness.  HENT: Positive for mouth dryness. Negative for mouth sores, trouble swallowing, trouble swallowing and nose dryness.   Eyes: Negative for pain, redness, visual disturbance and dryness.  Respiratory: Positive for shortness of breath. Negative for cough and difficulty breathing.        With exertion  Cardiovascular: Positive for hypertension. Negative for chest pain, palpitations, irregular heartbeat and swelling in legs/feet.  Gastrointestinal: Negative for blood in stool, constipation and diarrhea.  Endocrine: Negative for increased urination.  Genitourinary: Negative for vaginal dryness.  Musculoskeletal: Positive for arthralgias, joint pain and morning stiffness. Negative for joint swelling, myalgias, muscle weakness, muscle tenderness and myalgias.  Skin: Negative for color change, rash, hair loss, skin tightness,  ulcers and sensitivity to sunlight.  Allergic/Immunologic: Negative for susceptible to infections.  Neurological: Negative for dizziness, memory loss and night sweats.  Hematological: Negative for swollen glands.  Psychiatric/Behavioral: Negative for depressed mood and sleep disturbance. The patient is not nervous/anxious.     PMFS History:  Patient Active Problem List   Diagnosis Date Noted  . DDD (degenerative disc disease), lumbar with facet arthropathy and Spinal stenosis 03/12/2017  . Primary osteoarthritis of both knees 03/12/2017  . History of coronary artery disease s/p CABG  03/12/2017  . Gout 12/16/2012  . MORBID OBESITY 06/12/2009  . VITAMIN D DEFICIENCY 01/06/2009  . VENOUS INSUFFICIENCY 01/15/2008  . DIABETES MELLITUS 09/15/2007  . ALLERGIC RHINITIS 09/15/2007  . HYPERCHOLESTEROLEMIA 08/08/2007  . ANXIETY 08/08/2007  . Essential hypertension 08/08/2007  . Coronary atherosclerosis 08/08/2007  . DEGENERATIVE JOINT DISEASE 08/08/2007  . LOW BACK PAIN, CHRONIC 08/08/2007  . ANEMIA, HX OF 08/08/2007    Past Medical History:  Diagnosis Date  . Allergic rhinitis   . Anemia   . Anxiety   . Coronary atherosclerosis of unspecified type of vessel, native or graft   . Diabetes mellitus   . DJD (degenerative joint disease)   . Hypercholesteremia   . Hypertension   . Low back pain   . Morbid obesity (HCC)   . Venous insufficiency   . Vitamin D deficiency     Family History  Problem Relation Age of Onset  . Heart disease Mother   . Heart disease Brother    Past Surgical History:  Procedure Laterality Date  . ABDOMINAL HYSTERECTOMY    . CORONARY ARTERY BYPASS  GRAFT     3 vessel- Dr. Laneta SimmersBartle 8/96   Social History   Social History Narrative  . No narrative on file     Objective: Vital Signs: BP (!) 150/62 (BP Location: Left Arm, Patient Position: Sitting, Cuff Size: Normal)   Pulse 62   Resp 18   Ht 5' (1.524 m)   Wt 245 lb (111.1 kg)   BMI 47.85 kg/m     Physical Exam  Constitutional: She is oriented to person, place, and time. She appears well-developed and well-nourished.  HENT:  Head: Normocephalic and atraumatic.  Eyes: Conjunctivae and EOM are normal.  Neck: Normal range of motion.  Cardiovascular: Normal rate, regular rhythm, normal heart sounds and intact distal pulses.   Pulmonary/Chest: Effort normal and breath sounds normal.  Abdominal: Soft. Bowel sounds are normal.  Lymphadenopathy:    She has no cervical adenopathy.  Neurological: She is alert and oriented to person, place, and time.  Skin: Skin is warm and dry. Capillary refill takes less than 2 seconds.  Psychiatric: She has a normal mood and affect. Her behavior is normal.  Nursing note and vitals reviewed.    Musculoskeletal Exam: She has limited range of motion of her C-spine and thoracic lumbar spine. Shoulder joints elbow joints are good range of motion. She has DIP PIP thickening in her hands consistent with osteoarthritis. Hip joints were difficult to assess as she was mostly in the chair. Knee joints are good range of motion with discomfort. No warmth swelling or effusion was noted.  CDAI Exam: No CDAI exam completed.    Investigation: Findings:  09/14/2016 UDS     Imaging: No results found.  Speciality Comments: No specialty comments available.    Procedures:  No procedures performed Allergies: Metformin   Assessment / Plan:     Visit Diagnoses: DDD (degenerative disc disease), lumbar with facet arthropathy and Spinal stenosis: Chronic pain and discomfort. She recently added Neurontin to her regimen.  Primary osteoarthritis of both knees: On going pain and discomfort. She is awaiting repeat Visco supplement injections. She also requested a refill on Voltaren gel which was given. She is on tramadol for chronic pain. Narcotic agreement was renewed today. Side effects were again revised.  History of hypertension: Her systolic blood pressure was  slightly elevated today have advised her to monitor that.  History of hyperlipidemia  History of coronary artery disease s/p CABG   History of diabetes mellitus  Vitamin D deficiency : She is uncertain if she had DEXA scan. Have advised her to discuss that with her PCP.   Orders: No orders of the defined types were placed in this encounter.  Meds ordered this encounter  Medications  . diclofenac sodium (VOLTAREN) 1 % GEL    Sig: Apply 2 g topically 4 (four) times daily.    Dispense:  3 Tube    Refill:  2      Follow-Up Instructions: Return in about 6 months (around 09/18/2017) for OA.   Pollyann SavoyShaili Sri Clegg, MD  Note - This record has been created using Animal nutritionistDragon software.  Chart creation errors have been sought, but may not always  have been located. Such creation errors do not reflect on  the standard of medical care.

## 2017-03-18 ENCOUNTER — Encounter: Payer: Self-pay | Admitting: Rheumatology

## 2017-03-18 ENCOUNTER — Ambulatory Visit (INDEPENDENT_AMBULATORY_CARE_PROVIDER_SITE_OTHER): Payer: Medicare Other | Admitting: Rheumatology

## 2017-03-18 VITALS — BP 150/62 | HR 62 | Resp 18 | Ht 60.0 in | Wt 245.0 lb

## 2017-03-18 DIAGNOSIS — Z8639 Personal history of other endocrine, nutritional and metabolic disease: Secondary | ICD-10-CM | POA: Diagnosis not present

## 2017-03-18 DIAGNOSIS — E559 Vitamin D deficiency, unspecified: Secondary | ICD-10-CM

## 2017-03-18 DIAGNOSIS — M170 Bilateral primary osteoarthritis of knee: Secondary | ICD-10-CM

## 2017-03-18 DIAGNOSIS — Z8679 Personal history of other diseases of the circulatory system: Secondary | ICD-10-CM

## 2017-03-18 DIAGNOSIS — M5136 Other intervertebral disc degeneration, lumbar region: Secondary | ICD-10-CM

## 2017-03-18 DIAGNOSIS — M51369 Other intervertebral disc degeneration, lumbar region without mention of lumbar back pain or lower extremity pain: Secondary | ICD-10-CM

## 2017-03-18 MED ORDER — DICLOFENAC SODIUM 1 % TD GEL: 2.0000 g | Freq: Four times a day (QID) | TRANSDERMAL | 2 refills | Status: DC

## 2017-03-19 ENCOUNTER — Telehealth: Payer: Self-pay

## 2017-03-19 NOTE — Telephone Encounter (Signed)
Received a fax from [facility] requesting a prior authorization for Voltaren Gel. An authorization was submitted to patients' insurance via cover my meds. Will update once we receive a response.  Krystyna Cleckley, [place], CPhT 11:36 AM

## 2017-03-19 NOTE — Telephone Encounter (Signed)
Received a confirmation from [employer] My Med[employer] regarding a prior authorization approval for Diclofenac Gel through 08/23/2017.   Reference number:[id number] Phone number: [phone]  Spoke to patient to update her. She voices understanding and denies any questions at this time.  Curlee Bogan, [place], CPhT  12:16 PM

## 2017-05-16 ENCOUNTER — Other Ambulatory Visit: Payer: Self-pay | Admitting: Rheumatology

## 2017-05-17 NOTE — Telephone Encounter (Signed)
Last Visit: 03/18/17 Next Visit: 09/14/17  Okay to refill per Dr. Corliss Skains

## 2017-08-31 NOTE — Progress Notes (Signed)
Office Visit Note  Patient: Erika Gates             Date of Birth: Aug 30, 1934           MRN: [id number]             PCP: Marletta LorBarr, Julie, NP Referring: Marletta LorBarr, Julie, NP Visit Date: 09/14/2017 Occupation: @GUAROCC @    Subjective:  Other (BIL knee and back pain )   History of Present Illness: Erika Gates is a 83 y.o. female with history of osteoarthritis and disc disease. She continues to have discomfort in her knee joints and lower back. She states the cortisone injections and the Visco injections had been helpful in the past. She's been taking all tramadol 1 tablet by mouth twice a day for pain management.  Activities of Daily Living:  Patient reports morning stiffness for 1 hour.   Patient Denies nocturnal pain.  Difficulty dressing/grooming: Reports Difficulty climbing stairs: Reports Difficulty getting out of chair: Reports Difficulty using hands for taps, buttons, cutlery, and/or writing: Denies   Review of Systems  Constitutional: Positive for fatigue. Negative for night sweats, weight gain, weight loss and weakness.  HENT: Negative for mouth sores, trouble swallowing, trouble swallowing, mouth dryness and nose dryness.   Eyes: Negative for pain, redness, visual disturbance and dryness.  Respiratory: Negative for cough, shortness of breath and difficulty breathing.   Cardiovascular: Negative for chest pain, palpitations, hypertension, irregular heartbeat and swelling in legs/feet.  Gastrointestinal: Negative for blood in stool, constipation and diarrhea.  Endocrine: Negative for increased urination.  Genitourinary: Negative for vaginal dryness.  Musculoskeletal: Positive for arthralgias, joint pain and morning stiffness. Negative for joint swelling, myalgias, muscle weakness, muscle tenderness and myalgias.  Skin: Negative for color change, rash, hair loss, skin tightness, ulcers and sensitivity to sunlight.  Allergic/Immunologic: Negative for susceptible to  infections.  Neurological: Negative for dizziness, memory loss and night sweats.  Hematological: Negative for swollen glands.  Psychiatric/Behavioral: Negative for depressed mood and sleep disturbance. The patient is not nervous/anxious.     PMFS History:  Patient Active Problem List   Diagnosis Date Noted  . DDD (degenerative disc disease), lumbar with facet arthropathy and Spinal stenosis 03/12/2017  . Primary osteoarthritis of both knees 03/12/2017  . History of coronary artery disease s/p CABG  03/12/2017  . Gout 12/16/2012  . MORBID OBESITY 06/12/2009  . VITAMIN D DEFICIENCY 01/06/2009  . VENOUS INSUFFICIENCY 01/15/2008  . DIABETES MELLITUS 09/15/2007  . ALLERGIC RHINITIS 09/15/2007  . HYPERCHOLESTEROLEMIA 08/08/2007  . ANXIETY 08/08/2007  . Essential hypertension 08/08/2007  . Coronary atherosclerosis 08/08/2007  . DEGENERATIVE JOINT DISEASE 08/08/2007  . LOW BACK PAIN, CHRONIC 08/08/2007  . ANEMIA, HX OF 08/08/2007    Past Medical History:  Diagnosis Date  . Allergic rhinitis   . Anemia   . Anxiety   . Coronary atherosclerosis of unspecified type of vessel, native or graft   . Diabetes mellitus   . DJD (degenerative joint disease)   . Hypercholesteremia   . Hypertension   . Low back pain   . Morbid obesity (HCC)   . Venous insufficiency   . Vitamin D deficiency     Family History  Problem Relation Age of Onset  . Heart disease Mother   . Heart disease Brother    Past Surgical History:  Procedure Laterality Date  . ABDOMINAL HYSTERECTOMY    . CORONARY ARTERY BYPASS GRAFT     3 vessel- Dr. Laneta SimmersBartle 8/96   Social History  Social History Narrative  . Not on file     Objective: Vital Signs: BP (!) 143/65 (BP Location: Left Arm, Patient Position: Sitting, Cuff Size: Normal)   Pulse 63   Resp 18   Ht 5' (1.524 m)   Wt 237 lb (107.5 kg)   BMI 46.29 kg/m    Physical Exam  Constitutional: She is oriented to person, place, and time. She appears  well-developed and well-nourished.  HENT:  Head: Normocephalic and atraumatic.  Eyes: Conjunctivae and EOM are normal.  Neck: Normal range of motion.  Cardiovascular: Normal rate, regular rhythm, normal heart sounds and intact distal pulses.  Pulmonary/Chest: Effort normal and breath sounds normal.  Abdominal: Soft. Bowel sounds are normal.  Lymphadenopathy:    She has no cervical adenopathy.  Neurological: She is alert and oriented to person, place, and time.  Skin: Skin is warm and dry. Capillary refill takes less than 2 seconds.  Psychiatric: She has a normal mood and affect. Her behavior is normal.  Nursing note and vitals reviewed.    Musculoskeletal Exam: C-spine good range of motion. She has limited range of motion of her thoracic and lumbar spine with discomfort. Shoulder joints elbow joints are good range of motion. She is some DIP PIP thickening in her hands consistent with osteoarthritis. Hip joints are good range of motion. She has some crepitus and discomfort range of motion of bilateral knee joints. She has valgus deformity in bilateral knees due to severe osteoarthritis.  CDAI Exam: No CDAI exam completed.    Investigation: No additional findings.   Imaging: No results found.  Speciality Comments: No specialty comments available.    Procedures:  No procedures performed Allergies: Metformin   Assessment / Plan:     Visit Diagnoses: Primary osteoarthritis of both knees -she continues to have marked pain and discomfort in her bilateral knee joints. She does have end-stage osteoarthritis. She is in good response to Visco supplement injections in the past. I would schedule again per her request. The tramadol has been good to manage her pain. Which she will continue. We will obtain UDS today. Narc agreement: 03/23/2017  Primary osteoarthritis of both hands: She does have some osteoarthritic changes in her hands but it is not causing much discomfort.  DDD  (degenerative disc disease), lumbar with facet arthropathy and Spinal stenosis: She continues to have some lower back pain.  Medication management - Plan: Pain Mgmt, Profile 5 w/Conf, U  History of vitamin D deficiency: She is on vitamin D.  History of hypertension: Her systolic blood pressure is slightly elevated.  Other medical problems are listed as follows:  History of diabetes mellitus  History of hyperlipidemia  History of coronary artery disease s/p CABG     Orders: Orders Placed This Encounter  Procedures  . Pain Mgmt, Profile 5 w/Conf, U   No orders of the defined types were placed in this encounter.   Follow-Up Instructions: Return in about 6 months (around 03/14/2018) for Osteoarthritis,DDD.   Pollyann Savoy, MD  Note - This record has been created using Animal nutritionist.  Chart creation errors have been sought, but may not always  have been located. Such creation errors do not reflect on  the standard of medical care.

## 2017-09-14 ENCOUNTER — Encounter: Payer: Self-pay | Admitting: Rheumatology

## 2017-09-14 ENCOUNTER — Ambulatory Visit (INDEPENDENT_AMBULATORY_CARE_PROVIDER_SITE_OTHER): Payer: Medicare Other | Admitting: Rheumatology

## 2017-09-14 VITALS — BP 143/65 | HR 63 | Resp 18 | Ht 60.0 in | Wt 237.0 lb

## 2017-09-14 DIAGNOSIS — M51369 Other intervertebral disc degeneration, lumbar region without mention of lumbar back pain or lower extremity pain: Secondary | ICD-10-CM

## 2017-09-14 DIAGNOSIS — M19041 Primary osteoarthritis, right hand: Secondary | ICD-10-CM

## 2017-09-14 DIAGNOSIS — M19042 Primary osteoarthritis, left hand: Secondary | ICD-10-CM

## 2017-09-14 DIAGNOSIS — M5136 Other intervertebral disc degeneration, lumbar region: Secondary | ICD-10-CM

## 2017-09-14 DIAGNOSIS — M170 Bilateral primary osteoarthritis of knee: Secondary | ICD-10-CM

## 2017-09-14 DIAGNOSIS — Z8679 Personal history of other diseases of the circulatory system: Secondary | ICD-10-CM | POA: Diagnosis not present

## 2017-09-14 DIAGNOSIS — Z8639 Personal history of other endocrine, nutritional and metabolic disease: Secondary | ICD-10-CM | POA: Diagnosis not present

## 2017-09-14 DIAGNOSIS — Z79899 Other long term (current) drug therapy: Secondary | ICD-10-CM | POA: Diagnosis not present

## 2017-09-17 ENCOUNTER — Telehealth: Payer: Self-pay | Admitting: *Deleted

## 2017-09-17 NOTE — Telephone Encounter (Signed)
Euflexxa, bilateral, patient purchased. Patient needs Ultrasound guided injections w/Dr. Loni Beckwitheveswhar. Please call patient's daughter to schedule appointments. Thank you. Mary at [phone].

## 2017-09-21 LAB — PAIN MGMT, PROFILE 5 W/CONF, U
ALPHAHYDROXYALPRAZOLAM: 157 ng/mL — AB (ref <25)
ALPHAHYDROXYTRIAZOLAM: NEGATIVE ng/mL (ref <50)
AMINOCLONAZEPAM: NEGATIVE ng/mL (ref <25)
Alphahydroxymidazolam: NEGATIVE ng/mL (ref <50)
Amphetamines: NEGATIVE ng/mL (ref <500)
Barbiturates: NEGATIVE ng/mL (ref <300)
Benzodiazepines: POSITIVE ng/mL — AB (ref <100)
Cocaine Metabolite: NEGATIVE ng/mL (ref <150)
Creatinine: 89 mg/dL
HYDROXYETHYLFLURAZEPAM: NEGATIVE ng/mL (ref <50)
Lorazepam: NEGATIVE ng/mL (ref <50)
METHADONE METABOLITE: NEGATIVE ng/mL (ref <100)
Marijuana Metabolite: NEGATIVE ng/mL (ref <20)
Nordiazepam: NEGATIVE ng/mL (ref <50)
OPIATES: NEGATIVE ng/mL (ref <100)
OXAZEPAM: NEGATIVE ng/mL (ref <50)
OXYCODONE: NEGATIVE ng/mL (ref <100)
Oxidant: NEGATIVE ug/mL (ref <200)
TEMAZEPAM: NEGATIVE ng/mL (ref <50)
pH: 5.94 (ref 4.5–9.0)

## 2017-09-24 NOTE — Telephone Encounter (Signed)
I LMOM for Erika Gates (daughter) to call, and schedule patients Ultrasound guided Euflexxa injections.

## 2017-10-13 ENCOUNTER — Ambulatory Visit (INDEPENDENT_AMBULATORY_CARE_PROVIDER_SITE_OTHER): Payer: Medicare Other | Admitting: Rheumatology

## 2017-10-13 DIAGNOSIS — M1711 Unilateral primary osteoarthritis, right knee: Secondary | ICD-10-CM

## 2017-10-13 DIAGNOSIS — M1712 Unilateral primary osteoarthritis, left knee: Secondary | ICD-10-CM | POA: Diagnosis not present

## 2017-10-13 DIAGNOSIS — M170 Bilateral primary osteoarthritis of knee: Secondary | ICD-10-CM

## 2017-10-13 MED ORDER — SODIUM HYALURONATE (VISCOSUP) 20 MG/2ML IX SOSY
20.0000 mg | PREFILLED_SYRINGE | INTRA_ARTICULAR | Status: AC | PRN
  Administered 2017-10-13: 20 mg via INTRA_ARTICULAR

## 2017-10-13 MED ORDER — LIDOCAINE HCL 1 % IJ SOLN
1.5000 mL | INTRAMUSCULAR | Status: AC | PRN
  Administered 2017-10-13: 1.5 mL

## 2017-10-13 NOTE — Progress Notes (Signed)
   Procedure Note  Patient: Erika Gates             Date of Birth: March 19, 1935           MRN: [id number]             Visit Date: 10/13/2017  Procedures: Visit Diagnoses: Primary osteoarthritis of both knees Euflexxa #1 bilateral ultrasound guided  Large Joint Inj: bilateral knee on 10/13/2017 3:17 PM Indications: pain Details: 27 G 1.5 in needle, ultrasound-guided medial approach  Arthrogram: No  Medications (Right): 20 mg Sodium Hyaluronate 20 MG/2ML; 1.5 mL lidocaine 1 % Aspirate (Right): 0 mL Medications (Left): 20 mg Sodium Hyaluronate 20 MG/2ML; 1.5 mL lidocaine 1 % Aspirate (Left): 0 mL Outcome: tolerated well, no immediate complications Procedure, treatment alternatives, risks and benefits explained, specific risks discussed. Consent was given by the patient. Immediately prior to procedure a time out was called to verify the correct patient, procedure, equipment, support staff and site/side marked as required. Patient was prepped and draped in the usual sterile fashion.     Pollyann SavoyShaili Cande Mastropietro, MD

## 2017-10-20 ENCOUNTER — Ambulatory Visit (INDEPENDENT_AMBULATORY_CARE_PROVIDER_SITE_OTHER): Payer: Medicare Other | Admitting: Rheumatology

## 2017-10-20 DIAGNOSIS — M170 Bilateral primary osteoarthritis of knee: Secondary | ICD-10-CM

## 2017-10-20 DIAGNOSIS — M25562 Pain in left knee: Secondary | ICD-10-CM

## 2017-10-20 DIAGNOSIS — G8929 Other chronic pain: Secondary | ICD-10-CM

## 2017-10-20 DIAGNOSIS — M25561 Pain in right knee: Secondary | ICD-10-CM

## 2017-10-20 MED ORDER — LIDOCAINE HCL 1 % IJ SOLN
1.5000 mL | INTRAMUSCULAR | Status: AC | PRN
  Administered 2017-10-20: 1.5 mL

## 2017-10-20 MED ORDER — SODIUM HYALURONATE (VISCOSUP) 20 MG/2ML IX SOSY
20.0000 mg | PREFILLED_SYRINGE | INTRA_ARTICULAR | Status: AC | PRN
  Administered 2017-10-20: 20 mg via INTRA_ARTICULAR

## 2017-10-20 MED ORDER — SODIUM HYALURONATE (VISCOSUP) 20 MG/2ML IX SOSY
20.0000 mg | PREFILLED_SYRINGE | INTRA_ARTICULAR | Status: AC | PRN
Start: 2017-10-20 — End: 2017-10-20
  Administered 2017-10-20: 20 mg via INTRA_ARTICULAR

## 2017-10-20 NOTE — Progress Notes (Signed)
   Procedure Note  Patient: Erika Gates             Date of Birth: 05/21/1935           MRN: [id number]             Visit Date: 10/20/2017  Procedures: Visit Diagnoses: Primary osteoarthritis of both knees  Chronic pain of both knees Euflexxa #2 bilateral, ultrasound guided Large Joint Inj: bilateral knee on 10/20/2017 3:02 PM Indications: pain Details: 27 G 1.5 in needle, ultrasound-guided medial approach  Arthrogram: No  Medications (Right): 20 mg Sodium Hyaluronate 20 MG/2ML; 1.5 mL lidocaine 1 % Aspirate (Right): 0 mL Medications (Left): 20 mg Sodium Hyaluronate 20 MG/2ML; 1.5 mL lidocaine 1 % Aspirate (Left): 0 mL Outcome: tolerated well, no immediate complications Procedure, treatment alternatives, risks and benefits explained, specific risks discussed. Consent was given by the patient. Immediately prior to procedure a time out was called to verify the correct patient, procedure, equipment, support staff and site/side marked as required. Patient was prepped and draped in the usual sterile fashion.      Pollyann SavoyShaili Leah Thornberry, MD

## 2017-10-27 ENCOUNTER — Ambulatory Visit (INDEPENDENT_AMBULATORY_CARE_PROVIDER_SITE_OTHER): Payer: Medicare Other | Admitting: Rheumatology

## 2017-10-27 DIAGNOSIS — M170 Bilateral primary osteoarthritis of knee: Secondary | ICD-10-CM

## 2017-10-27 DIAGNOSIS — M1712 Unilateral primary osteoarthritis, left knee: Secondary | ICD-10-CM | POA: Diagnosis not present

## 2017-10-27 DIAGNOSIS — M1711 Unilateral primary osteoarthritis, right knee: Secondary | ICD-10-CM | POA: Diagnosis not present

## 2017-10-27 MED ORDER — LIDOCAINE HCL 1 % IJ SOLN
1.5000 mL | INTRAMUSCULAR | Status: AC | PRN
  Administered 2017-10-27: 1.5 mL

## 2017-10-27 MED ORDER — SODIUM HYALURONATE (VISCOSUP) 20 MG/2ML IX SOSY
20.0000 mg | PREFILLED_SYRINGE | INTRA_ARTICULAR | Status: AC | PRN
  Administered 2017-10-27: 20 mg via INTRA_ARTICULAR

## 2017-10-27 NOTE — Progress Notes (Signed)
   Procedure Note  Patient: Tressia DanasMyrtle V Cloyd             Date of Birth: 1935/07/18           MRN: [id number]             Visit Date: 10/27/2017  Procedures: Visit Diagnoses: Primary osteoarthritis of both knees Euflexxa #3 bilateral, ultrasound guided  Large Joint Inj: bilateral knee on 10/27/2017 3:19 PM Indications: pain Details: 27 G 1.5 in needle, ultrasound-guided medial approach  Arthrogram: No  Medications (Right): 20 mg Sodium Hyaluronate 20 MG/2ML; 1.5 mL lidocaine 1 % Aspirate (Right): 0 mL Medications (Left): 20 mg Sodium Hyaluronate 20 MG/2ML; 1.5 mL lidocaine 1 % Aspirate (Left): 0 mL Outcome: tolerated well, no immediate complications Procedure, treatment alternatives, risks and benefits explained, specific risks discussed. Consent was given by the patient. Immediately prior to procedure a time out was called to verify the correct patient, procedure, equipment, support staff and site/side marked as required. Patient was prepped and draped in the usual sterile fashion.     Pollyann SavoyShaili Lamario Mani, MD

## 2017-11-03 ENCOUNTER — Ambulatory Visit: Payer: Medicare Other | Admitting: Rheumatology

## 2018-03-04 NOTE — Progress Notes (Signed)
Office Visit Note  Patient: Erika Gates             Date of Birth: 1934/09/14           MRN: [id number]             PCP: Marletta LorBarr, Julie, NP Referring: Marletta LorBarr, Julie, NP Visit Date: 03/17/2018 Occupation: @GUAROCC @  Subjective:  Chronic lower back pain   History of Present Illness: Erika Gates is a 83 y.o. female with history of osteoarthritis and DDD.  Patient reports she continues to have chronic lower back pain and stiffness.  She states the pain is most severe if she is standing or walking for prolonged peers of time.  She ranks her lower back pain a 6 out of 10 at this time.  She states she takes Tylenol during the day for pain relief.  She takes gabapentin 100 mg at bedtime which provides some pain relief. she states she has been off of tramadol since January 2019 due to not having a refill.  She reports that her bilateral knees are doing well following the Euflexxa injections at the end of February.  She denies any joint swelling at this time.  She states she is having some discomfort in her left wrist but feels as though it is due to how she holds her cane.   Activities of Daily Living:  Patient reports morning stiffness for 30  minutes.   Patient Denies nocturnal pain.  Difficulty dressing/grooming: Denies Difficulty climbing stairs: Reports Difficulty getting out of chair: Reports Difficulty using hands for taps, buttons, cutlery, and/or writing: Denies  Review of Systems  Constitutional: Positive for fatigue.  HENT: Negative for mouth sores, mouth dryness and nose dryness.   Eyes: Negative for pain, visual disturbance and dryness.  Respiratory: Negative for cough, hemoptysis, shortness of breath and difficulty breathing.   Cardiovascular: Negative for chest pain, palpitations, hypertension and swelling in legs/feet.  Gastrointestinal: Negative for blood in stool, constipation and diarrhea.  Endocrine: Negative for increased urination.  Genitourinary: Negative for  painful urination.  Musculoskeletal: Positive for arthralgias, joint pain, myalgias, morning stiffness and myalgias. Negative for joint swelling, muscle weakness and muscle tenderness.  Skin: Negative for color change, pallor, rash, hair loss, nodules/bumps, skin tightness, ulcers and sensitivity to sunlight.  Allergic/Immunologic: Negative for susceptible to infections.  Neurological: Negative for dizziness, numbness, headaches and weakness.  Hematological: Negative for swollen glands.  Psychiatric/Behavioral: Negative for depressed mood and sleep disturbance. The patient is not nervous/anxious.     PMFS History:  Patient Active Problem List   Diagnosis Date Noted  . DDD (degenerative disc disease), lumbar with facet arthropathy and Spinal stenosis 03/12/2017  . Primary osteoarthritis of both knees 03/12/2017  . History of coronary artery disease s/p CABG  03/12/2017  . Gout 12/16/2012  . MORBID OBESITY 06/12/2009  . VITAMIN D DEFICIENCY 01/06/2009  . VENOUS INSUFFICIENCY 01/15/2008  . DIABETES MELLITUS 09/15/2007  . ALLERGIC RHINITIS 09/15/2007  . HYPERCHOLESTEROLEMIA 08/08/2007  . ANXIETY 08/08/2007  . Essential hypertension 08/08/2007  . Coronary atherosclerosis 08/08/2007  . DEGENERATIVE JOINT DISEASE 08/08/2007  . LOW BACK PAIN, CHRONIC 08/08/2007  . ANEMIA, HX OF 08/08/2007    Past Medical History:  Diagnosis Date  . Allergic rhinitis   . Anemia   . Anxiety   . Coronary atherosclerosis of unspecified type of vessel, native or graft   . Diabetes mellitus   . DJD (degenerative joint disease)   . Hypercholesteremia   . Hypertension   .  Low back pain   . Morbid obesity (HCC)   . Venous insufficiency   . Vitamin D deficiency     Family History  Problem Relation Age of Onset  . Heart disease Mother   . Heart disease Brother    Past Surgical History:  Procedure Laterality Date  . ABDOMINAL HYSTERECTOMY    . CORONARY ARTERY BYPASS GRAFT     3 vessel- Dr. Laneta Simmers  8/96   Social History   Social History Narrative  . Not on file    Objective: Vital Signs: BP (!) 142/75 (BP Location: Left Arm, Patient Position: Sitting, Cuff Size: Normal)   Pulse (!) 58   Resp 17   Ht 5' (1.524 m)   Wt 236 lb (107 kg)   BMI 46.09 kg/m    Physical Exam  Constitutional: She is oriented to person, place, and time. She appears well-developed and well-nourished.  HENT:  Head: Normocephalic and atraumatic.  Eyes: Conjunctivae and EOM are normal.  Neck: Normal range of motion.  Cardiovascular: Normal rate, regular rhythm, normal heart sounds and intact distal pulses.  Pulmonary/Chest: Effort normal and breath sounds normal.  Abdominal: Soft. Bowel sounds are normal.  Lymphadenopathy:    She has no cervical adenopathy.  Neurological: She is alert and oriented to person, place, and time.  Skin: Skin is warm and dry. Capillary refill takes less than 2 seconds.  Psychiatric: She has a normal mood and affect. Her behavior is normal.  Nursing note and vitals reviewed.    Musculoskeletal Exam: C-spine good ROM.  Thoracic and lumbar spine limited range of motion with discomfort.  Shoulder joints, elbow joints, wrist joints, MCPs, PIPs, and DIPs good ROM with no synovitis.  Mild PIP and DIP synovial thickening consistent with osteoarthritis of bilateral hands.  Hip joints, knee joints, and ankle joints good ROM.  No warmth or effusion of knee joints.  She has bilateral knee crepitus.  Valgus deformity of bilateral knee joints due to severe OA.  CDAI Exam: No CDAI exam completed.   Investigation: No additional findings.  Imaging: No results found.  Recent Labs: Lab Results  Component Value Date   WBC 6.0 12/16/2012   HGB 13.8 12/16/2012   PLT 140.0 (L) 12/16/2012   NA 138 06/21/2013   K 4.2 06/21/2013   CL 100 06/21/2013   CO2 29 06/21/2013   GLUCOSE 130 (H) 06/21/2013   BUN 16 06/21/2013   CREATININE 0.9 06/21/2013   BILITOT 0.6 12/16/2012   ALKPHOS 84  12/16/2012   AST 27 12/16/2012   ALT 25 12/16/2012   PROT 7.8 12/16/2012   ALBUMIN 4.0 12/16/2012   CALCIUM 10.2 06/21/2013   GFRAA 70 06/13/2008    Speciality Comments: No specialty comments available.  Procedures:  No procedures performed Allergies: Metformin     Assessment / Plan:     Visit Diagnoses: Primary osteoarthritis of both hands: She has mild PIP and DIP synovial thickening consistent with osteoarthritis of bilateral hands.  No synovitis was noted.  She has no discomfort at this time.  Joint protection and muscle strengthening were discussed.  She has been having some discomfort in her left wrist but no synovitis was noted.  The pain is most likely due to using her cane to assist her when walking.  Primary osteoarthritis of both knees: Doing well.  No warmth or effusion on exam.  She walks with a cane.  She had bilateral euflexxa injections on 10/13/17, 10/20/17, 10/27/17.   DDD (degenerative disc disease),  lumbar with facet arthropathy and Spinal stenosis: Chronic pain and stiffness.  She has midline spinal tenderness in the lumbar region.  The pain is most severe when she is walking or standing for prolonged periods of time.  She takes gabapentin 100 mg at bedtime which provides pain relief.  A refill of gabapentin was sent to the pharmacy today.  We will no longer refill tramadol due to the increased risk of dizziness and falls.  She has been off of tramadol since January 2019.  She has been taking Tylenol for pain relief.  Medication management - tramadol: She discontinued tramadol in January.  She has been taking tylenol for pain relief.   Other medical conditions are listed as follows:  History of coronary artery disease s/p CABG   History of hypertension  History of vitamin D deficiency  History of hyperlipidemia  History of diabetes mellitus   Orders: No orders of the defined types were placed in this encounter.  Meds ordered this encounter  Medications  .  gabapentin (NEURONTIN) 100 MG capsule    Sig: Take 1 capsule (100 mg total) by mouth at bedtime.    Dispense:  30 capsule    Refill:  0      Follow-Up Instructions: Return in about 6 months (around 09/17/2018) for Osteoarthritis, DDD.   Gearldine Bienenstock, PA-C   I examined and evaluated the patient with Sherron Ales PA.  Patient continues to have some lower back pain.  We had detailed discussion regarding her management.  She will continue gabapentin at bedtime which is been helpful.  She has been off tramadol for several months.  Need for regular exercise was discussed.  The plan of care was discussed as noted above.  Pollyann Savoy, MD  Note - This record has been created using Animal nutritionist.  Chart creation errors have been sought, but may not always  have been located. Such creation errors do not reflect on  the standard of medical care.

## 2018-03-17 ENCOUNTER — Encounter (INDEPENDENT_AMBULATORY_CARE_PROVIDER_SITE_OTHER): Payer: Self-pay

## 2018-03-17 ENCOUNTER — Encounter: Payer: Self-pay | Admitting: Rheumatology

## 2018-03-17 ENCOUNTER — Ambulatory Visit: Payer: Medicare Other | Admitting: Rheumatology

## 2018-03-17 VITALS — BP 142/75 | HR 58 | Resp 17 | Ht 60.0 in | Wt 236.0 lb

## 2018-03-17 DIAGNOSIS — Z79899 Other long term (current) drug therapy: Secondary | ICD-10-CM

## 2018-03-17 DIAGNOSIS — M5136 Other intervertebral disc degeneration, lumbar region: Secondary | ICD-10-CM

## 2018-03-17 DIAGNOSIS — M19041 Primary osteoarthritis, right hand: Secondary | ICD-10-CM | POA: Diagnosis not present

## 2018-03-17 DIAGNOSIS — M19042 Primary osteoarthritis, left hand: Secondary | ICD-10-CM

## 2018-03-17 DIAGNOSIS — M170 Bilateral primary osteoarthritis of knee: Secondary | ICD-10-CM | POA: Diagnosis not present

## 2018-03-17 DIAGNOSIS — Z8639 Personal history of other endocrine, nutritional and metabolic disease: Secondary | ICD-10-CM

## 2018-03-17 DIAGNOSIS — Z8679 Personal history of other diseases of the circulatory system: Secondary | ICD-10-CM

## 2018-03-17 MED ORDER — GABAPENTIN 100 MG PO CAPS: 100.0000 mg | ORAL_CAPSULE | Freq: Every day | ORAL | 0 refills | Status: AC

## 2018-09-06 NOTE — Progress Notes (Signed)
Office Visit Note  Patient: Erika Gates             Date of Birth: 31-Oct-1934           MRN: [id number]             PCP: Marletta LorBarr, Julie, NP Referring: Marletta LorBarr, Julie, NP Visit Date: 09/20/2018 Occupation: @GUAROCC @  Subjective:  Pain in knee joints and lower back.   History of Present Illness: Erika Gates is a 84 y.o. female with history of osteoarthritis and degenerative disc disease.  She states she has some discomfort in her bilateral knee joints and lower back especially in the morning.  The pain improves during the day.  Activities of Daily Living:  Patient reports morning stiffness for several hours.   Patient Reports nocturnal pain.  Difficulty dressing/grooming: Denies Difficulty climbing stairs: Reports Difficulty getting out of chair: Reports Difficulty using hands for taps, buttons, cutlery, and/or writing: Denies  Review of Systems  Constitutional: Negative for fatigue.  HENT: Positive for nose dryness. Negative for mouth sores and mouth dryness.   Eyes: Negative for itching and dryness.  Respiratory: Negative for shortness of breath, wheezing and difficulty breathing.   Cardiovascular: Negative for chest pain and hypertension.  Gastrointestinal: Negative for abdominal pain, constipation and diarrhea.  Endocrine: Negative for increased urination.  Genitourinary: Negative for painful urination, nocturia and pelvic pain.  Musculoskeletal: Positive for arthralgias, gait problem, joint pain and morning stiffness. Negative for joint swelling.  Skin: Negative for rash and redness.  Allergic/Immunologic: Negative for susceptible to infections.  Neurological: Negative for dizziness, light-headedness, headaches, memory loss and weakness.  Hematological: Negative for bruising/bleeding tendency.  Psychiatric/Behavioral: Negative for confusion. The patient is not nervous/anxious.     PMFS History:  Patient Active Problem List   Diagnosis Date Noted  . DDD  (degenerative disc disease), lumbar with facet arthropathy and Spinal stenosis 03/12/2017  . Primary osteoarthritis of both knees 03/12/2017  . History of coronary artery disease s/p CABG  03/12/2017  . Gout 12/16/2012  . MORBID OBESITY 06/12/2009  . VITAMIN D DEFICIENCY 01/06/2009  . VENOUS INSUFFICIENCY 01/15/2008  . DIABETES MELLITUS 09/15/2007  . ALLERGIC RHINITIS 09/15/2007  . HYPERCHOLESTEROLEMIA 08/08/2007  . ANXIETY 08/08/2007  . Essential hypertension 08/08/2007  . Coronary atherosclerosis 08/08/2007  . DEGENERATIVE JOINT DISEASE 08/08/2007  . LOW BACK PAIN, CHRONIC 08/08/2007  . ANEMIA, HX OF 08/08/2007    Past Medical History:  Diagnosis Date  . Allergic rhinitis   . Anemia   . Anxiety   . Coronary atherosclerosis of unspecified type of vessel, native or graft   . Diabetes mellitus   . DJD (degenerative joint disease)   . Hypercholesteremia   . Hypertension   . Low back pain   . Morbid obesity (HCC)   . Venous insufficiency   . Vitamin D deficiency     Family History  Problem Relation Age of Onset  . Heart disease Mother   . Heart disease Brother    Past Surgical History:  Procedure Laterality Date  . ABDOMINAL HYSTERECTOMY    . CORONARY ARTERY BYPASS GRAFT     3 vessel- Dr. Laneta SimmersBartle 8/96   Social History   Social History Narrative  . Not on file   Immunization History  Administered Date(s) Administered  . Influenza Split 06/25/2011, 07/18/2012, 05/30/2013  . Influenza Whole 06/13/2008, 06/04/2009, 06/11/2010  . Pneumococcal Polysaccharide-23 06/12/2009     Objective: Vital Signs: BP (!) 147/74 (BP Location: Left Arm, Patient Position:  Sitting, Cuff Size: Normal)   Pulse 60   Resp 17   Ht 5' (1.524 m)   Wt 221 lb (100.2 kg)   BMI 43.16 kg/m    Physical Exam Vitals signs and nursing note reviewed.  Constitutional:      Appearance: She is well-developed.  HENT:     Head: Normocephalic and atraumatic.  Eyes:     Conjunctiva/sclera:  Conjunctivae normal.  Neck:     Musculoskeletal: Normal range of motion.  Cardiovascular:     Rate and Rhythm: Normal rate and regular rhythm.     Heart sounds: Normal heart sounds.  Pulmonary:     Effort: Pulmonary effort is normal.     Breath sounds: Normal breath sounds.  Abdominal:     General: Bowel sounds are normal.     Palpations: Abdomen is soft.  Lymphadenopathy:     Cervical: No cervical adenopathy.  Skin:    General: Skin is warm and dry.     Capillary Refill: Capillary refill takes less than 2 seconds.  Neurological:     Mental Status: She is alert and oriented to person, place, and time.  Psychiatric:        Behavior: Behavior normal.      Musculoskeletal Exam: Patient has ongoing discomfort in her lower back and limited range of motion.  She has been mobilizing with the help of a cane.  Shoulder joints and elbow joints with good range of motion.  She has DIP and PIP thickening in her hands.  She has crepitus in bilateral knee joints without any warmth swelling or effusion.  CDAI Exam: CDAI Score: Not documented Patient Global Assessment: Not documented; Provider Global Assessment: Not documented Swollen: Not documented; Tender: Not documented Joint Exam   Not documented   There is currently no information documented on the homunculus. Go to the Rheumatology activity and complete the homunculus joint exam.  Investigation: No additional findings.  Imaging: No results found.  Recent Labs: Lab Results  Component Value Date   WBC 6.0 12/16/2012   HGB 13.8 12/16/2012   PLT 140.0 (L) 12/16/2012   NA 138 06/21/2013   K 4.2 06/21/2013   CL 100 06/21/2013   CO2 29 06/21/2013   GLUCOSE 130 (H) 06/21/2013   BUN 16 06/21/2013   CREATININE 0.9 06/21/2013   BILITOT 0.6 12/16/2012   ALKPHOS 84 12/16/2012   AST 27 12/16/2012   ALT 25 12/16/2012   PROT 7.8 12/16/2012   ALBUMIN 4.0 12/16/2012   CALCIUM 10.2 06/21/2013   GFRAA 70 06/13/2008    Speciality  Comments: No specialty comments available.  Procedures:  No procedures performed Allergies: Metformin   Assessment / Plan:     Visit Diagnoses: Primary osteoarthritis of both hands-she has some discomfort in her hands.  No swelling was noted.  Primary osteoarthritis of both knees - She had bilateral euflexxa injections on 10/13/17, 10/20/17, 10/27/17.  She has done very well with the last Euflexxa injections.  She would like to have repeat injections.  We will schedule ultrasound-guided injections in March 2020.  DDD (degenerative disc disease), lumbar with facet arthropathy and Spinal stenosis - gabapentin 100 mg at bedtime.  She continues to have some lower back discomfort.  The gabapentin has been helpful.  Medication management - She discontinued tramadol in January 2019.  She is a still on Xanax, Flexeril and gabapentin combination.  The risk of respiratory depression was discussed.  Other medical problems are listed as follows:  History of vitamin  D deficiency  History of hypertension  History of hyperlipidemia  History of diabetes mellitus  History of coronary artery disease s/p CABG    Orders: No orders of the defined types were placed in this encounter.  No orders of the defined types were placed in this encounter.     Follow-Up Instructions: Return in about 6 months (around 03/21/2019) for Osteoarthritis.   Pollyann SavoyShaili Stewart Sasaki, MD  Note - This record has been created using Animal nutritionistDragon software.  Chart creation errors have been sought, but may not always  have been located. Such creation errors do not reflect on  the standard of medical care.

## 2018-09-20 ENCOUNTER — Telehealth (INDEPENDENT_AMBULATORY_CARE_PROVIDER_SITE_OTHER): Payer: Self-pay | Admitting: *Deleted

## 2018-09-20 ENCOUNTER — Ambulatory Visit: Payer: Medicare Other | Admitting: Rheumatology

## 2018-09-20 ENCOUNTER — Encounter: Payer: Self-pay | Admitting: Rheumatology

## 2018-09-20 VITALS — BP 147/74 | HR 60 | Resp 17 | Ht 60.0 in | Wt 221.0 lb

## 2018-09-20 DIAGNOSIS — M19041 Primary osteoarthritis, right hand: Secondary | ICD-10-CM

## 2018-09-20 DIAGNOSIS — Z8639 Personal history of other endocrine, nutritional and metabolic disease: Secondary | ICD-10-CM

## 2018-09-20 DIAGNOSIS — Z8679 Personal history of other diseases of the circulatory system: Secondary | ICD-10-CM

## 2018-09-20 DIAGNOSIS — M19042 Primary osteoarthritis, left hand: Secondary | ICD-10-CM | POA: Diagnosis not present

## 2018-09-20 DIAGNOSIS — M5136 Other intervertebral disc degeneration, lumbar region: Secondary | ICD-10-CM

## 2018-09-20 DIAGNOSIS — Z79899 Other long term (current) drug therapy: Secondary | ICD-10-CM

## 2018-09-20 DIAGNOSIS — M170 Bilateral primary osteoarthritis of knee: Secondary | ICD-10-CM | POA: Diagnosis not present

## 2018-09-20 NOTE — Telephone Encounter (Signed)
Please apply for bilateral Euflexxa for Dr. Corliss Skains only, ultrasound guided, Friday mornings, before March, 2020 if possible. Thank you so much!

## 2018-09-27 NOTE — Telephone Encounter (Signed)
[employer] Medicare does not cover Euflexxa.

## 2018-09-28 NOTE — Telephone Encounter (Signed)
Yes, they cover Synvisc series

## 2018-09-28 NOTE — Telephone Encounter (Signed)
Will they cover anything?

## 2018-09-28 NOTE — Telephone Encounter (Signed)
Please apply for Synvisc. Thank you.

## 2018-09-29 NOTE — Telephone Encounter (Signed)
Noted  

## 2018-10-10 ENCOUNTER — Telehealth (INDEPENDENT_AMBULATORY_CARE_PROVIDER_SITE_OTHER): Payer: Self-pay

## 2018-10-10 NOTE — Telephone Encounter (Signed)
Submitted VOB for Synvisc series, bilateral knee, ultrasound guided.

## 2018-10-11 ENCOUNTER — Telehealth (INDEPENDENT_AMBULATORY_CARE_PROVIDER_SITE_OTHER): Payer: Self-pay

## 2018-10-11 NOTE — Telephone Encounter (Signed)
Please schedule patient an appointment with Dr. Corliss Skains only for Ultrasound Guided gel injection. Friday mornings before March if possible.  Thank You.   Patient is approved for Synvisc series, bilateral knee. Buy & Bill Covered at 100% after Co-pay Co-pay of $90.00 No PA required

## 2018-10-12 ENCOUNTER — Telehealth (INDEPENDENT_AMBULATORY_CARE_PROVIDER_SITE_OTHER): Payer: Self-pay

## 2018-10-12 NOTE — Telephone Encounter (Signed)
Spoke with Georgana who stated her [name] schedules all her appointments.  Atalya stated she will have her daughter call back later today or tomorrow to scheduled the injections.

## 2018-10-12 NOTE — Telephone Encounter (Signed)
Error

## 2018-11-11 ENCOUNTER — Ambulatory Visit: Payer: Medicare Other | Admitting: Rheumatology

## 2018-11-18 ENCOUNTER — Ambulatory Visit: Payer: Medicare Other | Admitting: Rheumatology

## 2018-11-25 ENCOUNTER — Ambulatory Visit: Payer: Medicare Other | Admitting: Rheumatology

## 2019-03-10 NOTE — Progress Notes (Deleted)
Office Visit Note  Patient: Erika Gates             Date of Birth: [birth year]           MRN: [id number]             PCP: Alvester Chou, NP Referring: Alvester Chou, NP Visit Date: 03/24/2019 Occupation: @GUAROCC @  Subjective:  No chief complaint on file.   History of Present Illness: Erika Gates is a 84 y.o. female ***   Activities of Daily Living:  Patient reports morning stiffness for *** {minute/hour:19697}.   Patient {ACTIONS;DENIES/REPORTS:21021675::"Denies"} nocturnal pain.  Difficulty dressing/grooming: {ACTIONS;DENIES/REPORTS:21021675::"Denies"} Difficulty climbing stairs: {ACTIONS;DENIES/REPORTS:21021675::"Denies"} Difficulty getting out of chair: {ACTIONS;DENIES/REPORTS:21021675::"Denies"} Difficulty using hands for taps, buttons, cutlery, and/or writing: {ACTIONS;DENIES/REPORTS:21021675::"Denies"}  No Rheumatology ROS completed.   PMFS History:  Patient Active Problem List   Diagnosis Date Noted  . DDD (degenerative disc disease), lumbar with facet arthropathy and Spinal stenosis 03/12/2017  . Primary osteoarthritis of both knees 03/12/2017  . History of coronary artery disease s/p CABG  03/12/2017  . Gout 12/16/2012  . MORBID OBESITY 06/12/2009  . VITAMIN D DEFICIENCY 01/06/2009  . VENOUS INSUFFICIENCY 01/15/2008  . DIABETES MELLITUS 09/15/2007  . ALLERGIC RHINITIS 09/15/2007  . HYPERCHOLESTEROLEMIA 08/08/2007  . ANXIETY 08/08/2007  . Essential hypertension 08/08/2007  . Coronary atherosclerosis 08/08/2007  . DEGENERATIVE JOINT DISEASE 08/08/2007  . LOW BACK PAIN, CHRONIC 08/08/2007  . ANEMIA, HX OF 08/08/2007    Past Medical History:  Diagnosis Date  . Allergic rhinitis   . Anemia   . Anxiety   . Coronary atherosclerosis of unspecified type of vessel, native or graft   . Diabetes mellitus   . DJD (degenerative joint disease)   . Hypercholesteremia   . Hypertension   . Low back pain   . Morbid obesity ([place])   . Venous insufficiency   .  Vitamin D deficiency     Family History  Problem Relation Age of Onset  . Heart disease Mother   . Heart disease Brother    Past Surgical History:  Procedure Laterality Date  . ABDOMINAL HYSTERECTOMY    . CORONARY ARTERY BYPASS GRAFT     3 vessel- Dr. Cyndia Bent 8/96   Social History   Social History Narrative  . Not on file   Immunization History  Administered Date(s) Administered  . Influenza Split 06/25/2011, 07/18/2012, 05/30/2013  . Influenza Whole 06/13/2008, 06/04/2009, 06/11/2010  . Pneumococcal Polysaccharide-23 06/12/2009     Objective: Vital Signs: There were no vitals taken for this visit.   Physical Exam   Musculoskeletal Exam: ***  CDAI Exam: CDAI Score: - Patient Global: -; Provider Global: - Swollen: -; Tender: - Joint Exam   No joint exam has been documented for this visit   There is currently no information documented on the homunculus. Go to the Rheumatology activity and complete the homunculus joint exam.  Investigation: No additional findings.  Imaging: No results found.  Recent Labs: Lab Results  Component Value Date   WBC 6.0 12/16/2012   HGB 13.8 12/16/2012   PLT 140.0 (L) 12/16/2012   NA 138 06/21/2013   K 4.2 06/21/2013   CL 100 06/21/2013   CO2 29 06/21/2013   GLUCOSE 130 (H) 06/21/2013   BUN 16 06/21/2013   CREATININE 0.9 06/21/2013   BILITOT 0.6 12/16/2012   ALKPHOS 84 12/16/2012   AST 27 12/16/2012   ALT 25 12/16/2012   PROT 7.8 12/16/2012   ALBUMIN 4.0 12/16/2012   CALCIUM  10.2 06/21/2013   GFRAA 70 06/13/2008    Speciality Comments: No specialty comments available.  Procedures:  No procedures performed Allergies: Metformin   Assessment / Plan:     Visit Diagnoses: No diagnosis found.  Orders: No orders of the defined types were placed in this encounter.  No orders of the defined types were placed in this encounter.   Face-to-face time spent with patient was *** minutes. Greater than 50% of time was spent  in counseling and coordination of care.  Follow-Up Instructions: No follow-ups on file.   Ellen HenriMarissa C Preslei Blakley, CMA  Note - This record has been created using Animal nutritionistDragon software.  Chart creation errors have been sought, but may not always  have been located. Such creation errors do not reflect on  the standard of medical care.

## 2019-03-24 ENCOUNTER — Ambulatory Visit: Payer: Self-pay | Admitting: Rheumatology

## 2020-05-13 DIAGNOSIS — H2513 Age-related nuclear cataract, bilateral: Secondary | ICD-10-CM | POA: Diagnosis not present

## 2020-05-13 DIAGNOSIS — H35033 Hypertensive retinopathy, bilateral: Secondary | ICD-10-CM | POA: Diagnosis not present

## 2020-05-13 DIAGNOSIS — E119 Type 2 diabetes mellitus without complications: Secondary | ICD-10-CM | POA: Diagnosis not present

## 2020-05-13 DIAGNOSIS — H4322 Crystalline deposits in vitreous body, left eye: Secondary | ICD-10-CM | POA: Diagnosis not present

## 2020-07-04 DIAGNOSIS — H18413 Arcus senilis, bilateral: Secondary | ICD-10-CM | POA: Diagnosis not present

## 2020-07-04 DIAGNOSIS — H25043 Posterior subcapsular polar age-related cataract, bilateral: Secondary | ICD-10-CM | POA: Diagnosis not present

## 2020-07-04 DIAGNOSIS — H2511 Age-related nuclear cataract, right eye: Secondary | ICD-10-CM | POA: Diagnosis not present

## 2020-07-04 DIAGNOSIS — E119 Type 2 diabetes mellitus without complications: Secondary | ICD-10-CM | POA: Diagnosis not present

## 2020-07-04 DIAGNOSIS — H2513 Age-related nuclear cataract, bilateral: Secondary | ICD-10-CM | POA: Diagnosis not present

## 2020-07-04 DIAGNOSIS — H25013 Cortical age-related cataract, bilateral: Secondary | ICD-10-CM | POA: Diagnosis not present

## 2020-07-12 DIAGNOSIS — M79606 Pain in leg, unspecified: Secondary | ICD-10-CM | POA: Diagnosis not present

## 2020-07-12 DIAGNOSIS — I2510 Atherosclerotic heart disease of native coronary artery without angina pectoris: Secondary | ICD-10-CM | POA: Diagnosis not present

## 2020-07-12 DIAGNOSIS — I10 Essential (primary) hypertension: Secondary | ICD-10-CM | POA: Diagnosis not present

## 2020-07-12 DIAGNOSIS — E1169 Type 2 diabetes mellitus with other specified complication: Secondary | ICD-10-CM | POA: Diagnosis not present

## 2020-07-12 DIAGNOSIS — R600 Localized edema: Secondary | ICD-10-CM | POA: Diagnosis not present

## 2020-07-12 DIAGNOSIS — E785 Hyperlipidemia, unspecified: Secondary | ICD-10-CM | POA: Diagnosis not present

## 2020-07-12 DIAGNOSIS — F411 Generalized anxiety disorder: Secondary | ICD-10-CM | POA: Diagnosis not present

## 2020-07-17 DIAGNOSIS — H2512 Age-related nuclear cataract, left eye: Secondary | ICD-10-CM | POA: Diagnosis not present

## 2020-07-17 DIAGNOSIS — H2511 Age-related nuclear cataract, right eye: Secondary | ICD-10-CM | POA: Diagnosis not present

## 2020-07-31 DIAGNOSIS — H2512 Age-related nuclear cataract, left eye: Secondary | ICD-10-CM | POA: Diagnosis not present

## 2021-01-31 DIAGNOSIS — Z1389 Encounter for screening for other disorder: Secondary | ICD-10-CM | POA: Diagnosis not present

## 2021-01-31 DIAGNOSIS — I10 Essential (primary) hypertension: Secondary | ICD-10-CM | POA: Diagnosis not present

## 2021-01-31 DIAGNOSIS — Z7984 Long term (current) use of oral hypoglycemic drugs: Secondary | ICD-10-CM | POA: Diagnosis not present

## 2021-01-31 DIAGNOSIS — E1122 Type 2 diabetes mellitus with diabetic chronic kidney disease: Secondary | ICD-10-CM | POA: Diagnosis not present

## 2021-01-31 DIAGNOSIS — M79606 Pain in leg, unspecified: Secondary | ICD-10-CM | POA: Diagnosis not present

## 2021-01-31 DIAGNOSIS — Z0000 Encounter for general adult medical examination without abnormal findings: Secondary | ICD-10-CM | POA: Diagnosis not present

## 2021-01-31 DIAGNOSIS — Z23 Encounter for immunization: Secondary | ICD-10-CM | POA: Diagnosis not present

## 2021-01-31 DIAGNOSIS — E785 Hyperlipidemia, unspecified: Secondary | ICD-10-CM | POA: Diagnosis not present

## 2021-01-31 DIAGNOSIS — N1832 Chronic kidney disease, stage 3b: Secondary | ICD-10-CM | POA: Diagnosis not present

## 2021-01-31 DIAGNOSIS — F411 Generalized anxiety disorder: Secondary | ICD-10-CM | POA: Diagnosis not present

## 2021-02-18 DIAGNOSIS — E113293 Type 2 diabetes mellitus with mild nonproliferative diabetic retinopathy without macular edema, bilateral: Secondary | ICD-10-CM | POA: Diagnosis not present

## 2021-02-18 DIAGNOSIS — H4322 Crystalline deposits in vitreous body, left eye: Secondary | ICD-10-CM | POA: Diagnosis not present

## 2021-02-18 DIAGNOSIS — H35033 Hypertensive retinopathy, bilateral: Secondary | ICD-10-CM | POA: Diagnosis not present

## 2021-02-18 DIAGNOSIS — Z961 Presence of intraocular lens: Secondary | ICD-10-CM | POA: Diagnosis not present

## 2021-02-27 DIAGNOSIS — Z7984 Long term (current) use of oral hypoglycemic drugs: Secondary | ICD-10-CM | POA: Diagnosis not present

## 2021-02-27 DIAGNOSIS — E1122 Type 2 diabetes mellitus with diabetic chronic kidney disease: Secondary | ICD-10-CM | POA: Diagnosis not present

## 2021-02-27 DIAGNOSIS — N1832 Chronic kidney disease, stage 3b: Secondary | ICD-10-CM | POA: Diagnosis not present

## 2021-07-14 ENCOUNTER — Other Ambulatory Visit: Payer: Self-pay

## 2021-07-14 ENCOUNTER — Inpatient Hospital Stay (HOSPITAL_COMMUNITY)
Admission: EM | Admit: 2021-07-14 | Discharge: 2021-07-18 | DRG: 291 | Disposition: A | Discharge status: Home Health | Payer: Medicare PPO | Attending: Family Medicine | Admitting: Family Medicine

## 2021-07-14 ENCOUNTER — Emergency Department (HOSPITAL_COMMUNITY): Payer: Medicare PPO

## 2021-07-14 ENCOUNTER — Inpatient Hospital Stay (HOSPITAL_COMMUNITY): Payer: Medicare PPO

## 2021-07-14 ENCOUNTER — Encounter (HOSPITAL_COMMUNITY): Payer: Self-pay | Admitting: Critical Care Medicine

## 2021-07-14 DIAGNOSIS — J9602 Acute respiratory failure with hypercapnia: Secondary | ICD-10-CM

## 2021-07-14 DIAGNOSIS — M79606 Pain in leg, unspecified: Secondary | ICD-10-CM | POA: Diagnosis present

## 2021-07-14 DIAGNOSIS — I130 Hypertensive heart and chronic kidney disease with heart failure and stage 1 through stage 4 chronic kidney disease, or unspecified chronic kidney disease: Principal | ICD-10-CM | POA: Diagnosis present

## 2021-07-14 DIAGNOSIS — J189 Pneumonia, unspecified organism: Secondary | ICD-10-CM | POA: Diagnosis not present

## 2021-07-14 DIAGNOSIS — J9601 Acute respiratory failure with hypoxia: Secondary | ICD-10-CM | POA: Diagnosis present

## 2021-07-14 DIAGNOSIS — R0602 Shortness of breath: Secondary | ICD-10-CM | POA: Diagnosis not present

## 2021-07-14 DIAGNOSIS — E8729 Other acidosis: Secondary | ICD-10-CM | POA: Diagnosis not present

## 2021-07-14 DIAGNOSIS — J90 Pleural effusion, not elsewhere classified: Secondary | ICD-10-CM | POA: Diagnosis not present

## 2021-07-14 DIAGNOSIS — N1832 Chronic kidney disease, stage 3b: Secondary | ICD-10-CM | POA: Diagnosis present

## 2021-07-14 DIAGNOSIS — E7800 Pure hypercholesterolemia, unspecified: Secondary | ICD-10-CM | POA: Diagnosis present

## 2021-07-14 DIAGNOSIS — R739 Hyperglycemia, unspecified: Secondary | ICD-10-CM | POA: Diagnosis not present

## 2021-07-14 DIAGNOSIS — Z8249 Family history of ischemic heart disease and other diseases of the circulatory system: Secondary | ICD-10-CM

## 2021-07-14 DIAGNOSIS — R7401 Elevation of levels of liver transaminase levels: Secondary | ICD-10-CM | POA: Diagnosis present

## 2021-07-14 DIAGNOSIS — R0689 Other abnormalities of breathing: Secondary | ICD-10-CM | POA: Diagnosis not present

## 2021-07-14 DIAGNOSIS — J9690 Respiratory failure, unspecified, unspecified whether with hypoxia or hypercapnia: Secondary | ICD-10-CM | POA: Diagnosis not present

## 2021-07-14 DIAGNOSIS — E875 Hyperkalemia: Secondary | ICD-10-CM | POA: Diagnosis not present

## 2021-07-14 DIAGNOSIS — Z951 Presence of aortocoronary bypass graft: Secondary | ICD-10-CM | POA: Diagnosis not present

## 2021-07-14 DIAGNOSIS — Z90710 Acquired absence of both cervix and uterus: Secondary | ICD-10-CM

## 2021-07-14 DIAGNOSIS — E871 Hypo-osmolality and hyponatremia: Secondary | ICD-10-CM | POA: Diagnosis present

## 2021-07-14 DIAGNOSIS — F419 Anxiety disorder, unspecified: Secondary | ICD-10-CM | POA: Diagnosis present

## 2021-07-14 DIAGNOSIS — I509 Heart failure, unspecified: Secondary | ICD-10-CM

## 2021-07-14 DIAGNOSIS — G9341 Metabolic encephalopathy: Secondary | ICD-10-CM | POA: Diagnosis present

## 2021-07-14 DIAGNOSIS — D696 Thrombocytopenia, unspecified: Secondary | ICD-10-CM | POA: Diagnosis present

## 2021-07-14 DIAGNOSIS — J80 Acute respiratory distress syndrome: Secondary | ICD-10-CM | POA: Diagnosis not present

## 2021-07-14 DIAGNOSIS — Z79899 Other long term (current) drug therapy: Secondary | ICD-10-CM

## 2021-07-14 DIAGNOSIS — Z66 Do not resuscitate: Secondary | ICD-10-CM | POA: Diagnosis not present

## 2021-07-14 DIAGNOSIS — R404 Transient alteration of awareness: Secondary | ICD-10-CM | POA: Diagnosis not present

## 2021-07-14 DIAGNOSIS — Z20822 Contact with and (suspected) exposure to covid-19: Secondary | ICD-10-CM | POA: Diagnosis not present

## 2021-07-14 DIAGNOSIS — I2510 Atherosclerotic heart disease of native coronary artery without angina pectoris: Secondary | ICD-10-CM | POA: Diagnosis present

## 2021-07-14 DIAGNOSIS — I517 Cardiomegaly: Secondary | ICD-10-CM | POA: Diagnosis not present

## 2021-07-14 DIAGNOSIS — E1165 Type 2 diabetes mellitus with hyperglycemia: Secondary | ICD-10-CM | POA: Diagnosis present

## 2021-07-14 DIAGNOSIS — M79604 Pain in right leg: Secondary | ICD-10-CM | POA: Diagnosis not present

## 2021-07-14 DIAGNOSIS — D649 Anemia, unspecified: Secondary | ICD-10-CM | POA: Diagnosis present

## 2021-07-14 DIAGNOSIS — Z6841 Body Mass Index (BMI) 40.0 and over, adult: Secondary | ICD-10-CM

## 2021-07-14 DIAGNOSIS — M79605 Pain in left leg: Secondary | ICD-10-CM | POA: Diagnosis not present

## 2021-07-14 DIAGNOSIS — I5033 Acute on chronic diastolic (congestive) heart failure: Secondary | ICD-10-CM | POA: Diagnosis present

## 2021-07-14 DIAGNOSIS — N179 Acute kidney failure, unspecified: Secondary | ICD-10-CM | POA: Diagnosis present

## 2021-07-14 DIAGNOSIS — E1122 Type 2 diabetes mellitus with diabetic chronic kidney disease: Secondary | ICD-10-CM | POA: Diagnosis present

## 2021-07-14 DIAGNOSIS — R0902 Hypoxemia: Secondary | ICD-10-CM | POA: Diagnosis not present

## 2021-07-14 DIAGNOSIS — G8929 Other chronic pain: Secondary | ICD-10-CM | POA: Diagnosis present

## 2021-07-14 DIAGNOSIS — M549 Dorsalgia, unspecified: Secondary | ICD-10-CM | POA: Diagnosis present

## 2021-07-14 DIAGNOSIS — M7989 Other specified soft tissue disorders: Secondary | ICD-10-CM | POA: Diagnosis not present

## 2021-07-14 LAB — URINALYSIS, ROUTINE W REFLEX MICROSCOPIC
Bilirubin Urine: NEGATIVE
Glucose, UA: NEGATIVE mg/dL
Hgb urine dipstick: NEGATIVE
Ketones, ur: NEGATIVE mg/dL
Leukocytes,Ua: NEGATIVE
Nitrite: NEGATIVE
Protein, ur: NEGATIVE mg/dL
Specific Gravity, Urine: 1.009 (ref 1.005–1.030)
pH: 5 (ref 5.0–8.0)

## 2021-07-14 LAB — CBC WITH DIFFERENTIAL/PLATELET
Abs Immature Granulocytes: 0.06 10*3/uL (ref 0.00–0.07)
Basophils Absolute: 0 10*3/uL (ref 0.0–0.1)
Basophils Relative: 0 %
Eosinophils Absolute: 0 10*3/uL (ref 0.0–0.5)
Eosinophils Relative: 0 %
HCT: 31.2 % — ABNORMAL LOW (ref 36.0–46.0)
Hemoglobin: 9.5 g/dL — ABNORMAL LOW (ref 12.0–15.0)
Immature Granulocytes: 1 %
Lymphocytes Relative: 16 %
Lymphs Abs: 1.5 10*3/uL (ref 0.7–4.0)
MCH: 30 pg (ref 26.0–34.0)
MCHC: 30.4 g/dL (ref 30.0–36.0)
MCV: 98.4 fL (ref 80.0–100.0)
Monocytes Absolute: 0.7 10*3/uL (ref 0.1–1.0)
Monocytes Relative: 8 %
Neutro Abs: 6.8 10*3/uL (ref 1.7–7.7)
Neutrophils Relative %: 75 %
Platelets: 138 10*3/uL — ABNORMAL LOW (ref 150–400)
RBC: 3.17 MIL/uL — ABNORMAL LOW (ref 3.87–5.11)
RDW: 14.2 % (ref 11.5–15.5)
WBC: 9 10*3/uL (ref 4.0–10.5)
nRBC: 0 % (ref 0.0–0.2)

## 2021-07-14 LAB — BASIC METABOLIC PANEL
Anion gap: 11 (ref 5–15)
BUN: 55 mg/dL — ABNORMAL HIGH (ref 8–23)
CO2: 23 mmol/L (ref 22–32)
Calcium: 9.7 mg/dL (ref 8.9–10.3)
Chloride: 99 mmol/L (ref 98–111)
Creatinine, Ser: 1.39 mg/dL — ABNORMAL HIGH (ref 0.44–1.00)
GFR, Estimated: 37 mL/min — ABNORMAL LOW (ref >60)
Glucose, Bld: 217 mg/dL — ABNORMAL HIGH (ref 70–99)
Potassium: 6.1 mmol/L — ABNORMAL HIGH (ref 3.5–5.1)
Sodium: 133 mmol/L — ABNORMAL LOW (ref 135–145)

## 2021-07-14 LAB — I-STAT VENOUS BLOOD GAS, ED
Acid-base deficit: 1 mmol/L (ref 0.0–2.0)
Acid-base deficit: 2 mmol/L (ref 0.0–2.0)
Bicarbonate: 28 mmol/L (ref 20.0–28.0)
Bicarbonate: 28.6 mmol/L — ABNORMAL HIGH (ref 20.0–28.0)
Calcium, Ion: 1.29 mmol/L (ref 1.15–1.40)
Calcium, Ion: 1.35 mmol/L (ref 1.15–1.40)
HCT: 30 % — ABNORMAL LOW (ref 36.0–46.0)
HCT: 31 % — ABNORMAL LOW (ref 36.0–46.0)
Hemoglobin: 10.2 g/dL — ABNORMAL LOW (ref 12.0–15.0)
Hemoglobin: 10.5 g/dL — ABNORMAL LOW (ref 12.0–15.0)
O2 Saturation: 80 %
O2 Saturation: 94 %
Potassium: 5.5 mmol/L — ABNORMAL HIGH (ref 3.5–5.1)
Potassium: 5.8 mmol/L — ABNORMAL HIGH (ref 3.5–5.1)
Sodium: 132 mmol/L — ABNORMAL LOW (ref 135–145)
Sodium: 133 mmol/L — ABNORMAL LOW (ref 135–145)
TCO2: 30 mmol/L (ref 22–32)
TCO2: 31 mmol/L (ref 22–32)
pCO2, Ven: 73.4 mmHg (ref 44.0–60.0)
pCO2, Ven: 81.8 mmHg (ref 44.0–60.0)
pH, Ven: 7.142 — CL (ref 7.250–7.430)
pH, Ven: 7.198 — CL (ref 7.250–7.430)
pO2, Ven: 56 mmHg — ABNORMAL HIGH (ref 32.0–45.0)
pO2, Ven: 94 mmHg — ABNORMAL HIGH (ref 32.0–45.0)

## 2021-07-14 LAB — POCT I-STAT 7, (LYTES, BLD GAS, ICA,H+H)
Acid-base deficit: 2 mmol/L (ref 0.0–2.0)
Bicarbonate: 25.5 mmol/L (ref 20.0–28.0)
Calcium, Ion: 1.35 mmol/L (ref 1.15–1.40)
HCT: 31 % — ABNORMAL LOW (ref 36.0–46.0)
Hemoglobin: 10.5 g/dL — ABNORMAL LOW (ref 12.0–15.0)
O2 Saturation: 81 %
Patient temperature: 98
Potassium: 5.7 mmol/L — ABNORMAL HIGH (ref 3.5–5.1)
Sodium: 134 mmol/L — ABNORMAL LOW (ref 135–145)
TCO2: 27 mmol/L (ref 22–32)
pCO2 arterial: 58.4 mmHg — ABNORMAL HIGH (ref 32.0–48.0)
pH, Arterial: 7.247 — ABNORMAL LOW (ref 7.350–7.450)
pO2, Arterial: 52 mmHg — ABNORMAL LOW (ref 83.0–108.0)

## 2021-07-14 LAB — COMPREHENSIVE METABOLIC PANEL
ALT: 55 U/L — ABNORMAL HIGH (ref 0–44)
AST: 60 U/L — ABNORMAL HIGH (ref 15–41)
Albumin: 3.3 g/dL — ABNORMAL LOW (ref 3.5–5.0)
Alkaline Phosphatase: 66 U/L (ref 38–126)
Anion gap: 8 (ref 5–15)
BUN: 50 mg/dL — ABNORMAL HIGH (ref 8–23)
CO2: 26 mmol/L (ref 22–32)
Calcium: 9.5 mg/dL (ref 8.9–10.3)
Chloride: 99 mmol/L (ref 98–111)
Creatinine, Ser: 1.38 mg/dL — ABNORMAL HIGH (ref 0.44–1.00)
GFR, Estimated: 38 mL/min — ABNORMAL LOW (ref >60)
Glucose, Bld: 249 mg/dL — ABNORMAL HIGH (ref 70–99)
Potassium: 5.5 mmol/L — ABNORMAL HIGH (ref 3.5–5.1)
Sodium: 133 mmol/L — ABNORMAL LOW (ref 135–145)
Total Bilirubin: 0.4 mg/dL (ref 0.3–1.2)
Total Protein: 7.2 g/dL (ref 6.5–8.1)

## 2021-07-14 LAB — RAPID URINE DRUG SCREEN, HOSP PERFORMED
Amphetamines: NOT DETECTED
Barbiturates: NOT DETECTED
Benzodiazepines: POSITIVE — AB
Cocaine: NOT DETECTED
Opiates: NOT DETECTED
Tetrahydrocannabinol: NOT DETECTED

## 2021-07-14 LAB — ECHOCARDIOGRAM COMPLETE
AR max vel: 2.61 cm2
AV Peak grad: 10.1 mmHg
Ao pk vel: 1.59 m/s
Area-P 1/2: 3.46 cm2
Calc EF: 56.9 %
Height: 60 in
S' Lateral: 2.7 cm
Single Plane A2C EF: 55.3 %
Single Plane A4C EF: 58.5 %
Weight: 3534.41 oz

## 2021-07-14 LAB — CBG MONITORING, ED
Glucose-Capillary: 215 mg/dL — ABNORMAL HIGH (ref 70–99)
Glucose-Capillary: 244 mg/dL — ABNORMAL HIGH (ref 70–99)

## 2021-07-14 LAB — PROCALCITONIN: Procalcitonin: <0.1 ng/mL

## 2021-07-14 LAB — HEMOGLOBIN A1C
Hgb A1c MFr Bld: 5.6 % (ref 4.8–5.6)
Mean Plasma Glucose: 114.02 mg/dL

## 2021-07-14 LAB — BRAIN NATRIURETIC PEPTIDE: B Natriuretic Peptide: 603.3 pg/mL — ABNORMAL HIGH (ref 0.0–100.0)

## 2021-07-14 LAB — GLUCOSE, CAPILLARY
Glucose-Capillary: 215 mg/dL — ABNORMAL HIGH (ref 70–99)
Glucose-Capillary: 201 mg/dL — ABNORMAL HIGH (ref 70–99)
Glucose-Capillary: 203 mg/dL — ABNORMAL HIGH (ref 70–99)
Glucose-Capillary: 186 mg/dL — ABNORMAL HIGH (ref 70–99)
Glucose-Capillary: 150 mg/dL — ABNORMAL HIGH (ref 70–99)

## 2021-07-14 LAB — RESP PANEL BY RT-PCR (FLU A&B, COVID) ARPGX2
Influenza A by PCR: NEGATIVE
Influenza B by PCR: NEGATIVE
SARS Coronavirus 2 by RT PCR: NEGATIVE

## 2021-07-14 LAB — TROPONIN I (HIGH SENSITIVITY)
Troponin I (High Sensitivity): 9 ng/L (ref <18)
Troponin I (High Sensitivity): 11 ng/L (ref <18)

## 2021-07-14 LAB — MRSA NEXT GEN BY PCR, NASAL: MRSA by PCR Next Gen: NOT DETECTED

## 2021-07-14 LAB — LACTIC ACID, PLASMA: Lactic Acid, Venous: 1.1 mmol/L (ref 0.5–1.9)

## 2021-07-14 MED ORDER — CHLORHEXIDINE GLUCONATE CLOTH 2 % EX PADS
6.0000 | MEDICATED_PAD | Freq: Every day | CUTANEOUS | Status: DC
  Administered 2021-07-15 – 2021-07-16 (×3): 6 via TOPICAL

## 2021-07-14 MED ORDER — AMLODIPINE BESYLATE 5 MG PO TABS
5.0000 mg | ORAL_TABLET | Freq: Every day | ORAL | Status: DC
  Administered 2021-07-15 – 2021-07-18 (×4): 5 mg via ORAL
  Filled 2021-07-14 (×5): qty 1

## 2021-07-14 MED ORDER — ORAL CARE MOUTH RINSE
15.0000 mL | Freq: Two times a day (BID) | OROMUCOSAL | Status: DC
  Administered 2021-07-14 – 2021-07-18 (×8): 15 mL via OROMUCOSAL

## 2021-07-14 MED ORDER — FUROSEMIDE 10 MG/ML IJ SOLN
20.0000 mg | Freq: Once | INTRAMUSCULAR | Status: AC
  Administered 2021-07-14: 20 mg via INTRAVENOUS
  Filled 2021-07-14: qty 2

## 2021-07-14 MED ORDER — DOCUSATE SODIUM 100 MG PO CAPS: 100.0000 mg | ORAL_CAPSULE | Freq: Two times a day (BID) | ORAL | Status: DC | PRN

## 2021-07-14 MED ORDER — FUROSEMIDE 10 MG/ML IJ SOLN
40.0000 mg | Freq: Once | INTRAMUSCULAR | Status: AC
  Administered 2021-07-14: 40 mg via INTRAVENOUS
  Filled 2021-07-14: qty 4

## 2021-07-14 MED ORDER — INSULIN ASPART 100 UNIT/ML IJ SOLN
0.0000 [IU] | INTRAMUSCULAR | Status: DC
  Administered 2021-07-14 (×3): 2 [IU] via SUBCUTANEOUS
  Administered 2021-07-14 – 2021-07-17 (×5): 1 [IU] via SUBCUTANEOUS

## 2021-07-14 MED ORDER — METOPROLOL SUCCINATE ER 100 MG PO TB24
100.0000 mg | ORAL_TABLET | Freq: Every day | ORAL | Status: DC
  Administered 2021-07-15 – 2021-07-18 (×4): 100 mg via ORAL
  Filled 2021-07-14: qty 2
  Filled 2021-07-14 (×2): qty 1
  Filled 2021-07-14 (×2): qty 2

## 2021-07-14 MED ORDER — POLYETHYLENE GLYCOL 3350 17 G PO PACK: 17.0000 g | PACK | Freq: Every day | ORAL | Status: DC | PRN

## 2021-07-14 MED ORDER — CEFTRIAXONE SODIUM 2 G IJ SOLR
2.0000 g | INTRAMUSCULAR | Status: DC
  Administered 2021-07-14 – 2021-07-15 (×2): 2 g via INTRAVENOUS
  Filled 2021-07-14 (×2): qty 20

## 2021-07-14 MED ORDER — SODIUM CHLORIDE 0.9 % IV SOLN
500.0000 mg | INTRAVENOUS | Status: DC
  Administered 2021-07-14: 500 mg via INTRAVENOUS
  Filled 2021-07-14 (×2): qty 500

## 2021-07-14 MED ORDER — HEPARIN SODIUM (PORCINE) 5000 UNIT/ML IJ SOLN
5000.0000 [IU] | Freq: Three times a day (TID) | INTRAMUSCULAR | Status: DC
  Administered 2021-07-14 – 2021-07-18 (×13): 5000 [IU] via SUBCUTANEOUS
  Filled 2021-07-14 (×14): qty 1

## 2021-07-14 MED ORDER — ONDANSETRON HCL 4 MG/2ML IJ SOLN: 4.0000 mg | Freq: Four times a day (QID) | INTRAMUSCULAR | Status: DC | PRN

## 2021-07-14 MED ORDER — PERFLUTREN LIPID MICROSPHERE
1.0000 mL | INTRAVENOUS | Status: AC | PRN
Start: 2021-07-14 — End: 2021-07-14
  Administered 2021-07-14: 2 mL via INTRAVENOUS
  Filled 2021-07-14: qty 10

## 2021-07-14 NOTE — ED Notes (Signed)
The pt has inspiratory wheezes even on the bi-pap

## 2021-07-14 NOTE — ED Triage Notes (Signed)
The pt arrived by [facility] from home  she was in respiratory distress when ems arrived there   sats low no breath sounds evident  sinus brady  iv lt a-c  albuterol hhn on the way here solumedrol  125mg  iv mag sulfate 2 gm on the way here also   son arrived  and is at bedside at present  audible wheezes on arrival

## 2021-07-14 NOTE — Progress Notes (Signed)
RT NOTE: RT transported patient on bipap from ED to room 2M06 with no complications. RT on unit given report. Vitals are stable. RT will continue to monitor.

## 2021-07-14 NOTE — ED Provider Notes (Signed)
[facility] EMERGENCY DEPARTMENT Provider Note  CSN: [id number] Arrival date & time: 07/14/21 1308  Chief Complaint(s) Shortness of Breath  HPI Erika Gates is a 86 y.o. female who presents to the emergency department for somnolence and increased work of breathing.  Brought in by EMS who were called out to the patient's home.  They report that they were called out for blood sugar issues .  Patient's CBG was in the 300s. They noted patient was satting in the 70s on room air.  Noted she had diminished lung sounds throughout with faint wheezing.  They also noted patient was bradycardic but normotensive.  Gave the patient 2 DuoNeb's, Solu-Medrol, and magnesium.  Saturations were stable above 95% with breathing treatments.  Patient mental status is unchanged.  Remainder of history, ROS, and physical exam limited due to patient's condition (somnolence). Additional information was obtained from EMS.   Level V Caveat.  Patient's grandson who works in healthcare arrive shortly after triage.  He reported that the patient's become somnolent and has increased work of breathing when her blood sugars are low.  He did report that she was a bit somnolent and had low blood sugar earlier this evening but was given food.  After this she had improved and was up walking around the house.  She was seen at 1 AM doing well.  Around 4 this morning they noted that she was somnolent and had increased work of breathing.  However this was worse than her usual state prompting a call to EMS.  HPI  Past Medical History Past Medical History:  Diagnosis Date   Allergic rhinitis    Anemia    Anxiety    Coronary atherosclerosis of unspecified type of vessel, native or graft    Diabetes mellitus    DJD (degenerative joint disease)    Hypercholesteremia    Hypertension    Low back pain    Morbid obesity (HCC)    Venous insufficiency    Vitamin D deficiency    Patient Active Problem List    Diagnosis Date Noted   Acute respiratory failure with hypoxia and hypercarbia (HCC) 07/14/2021   DDD (degenerative disc disease), lumbar with facet arthropathy and Spinal stenosis 03/12/2017   Primary osteoarthritis of both knees 03/12/2017   History of coronary artery disease s/p CABG  03/12/2017   Gout 12/16/2012   MORBID OBESITY 06/12/2009   VITAMIN D DEFICIENCY 01/06/2009   VENOUS INSUFFICIENCY 01/15/2008   DIABETES MELLITUS 09/15/2007   ALLERGIC RHINITIS 09/15/2007   HYPERCHOLESTEROLEMIA 08/08/2007   ANXIETY 08/08/2007   Essential hypertension 08/08/2007   Coronary atherosclerosis 08/08/2007   DEGENERATIVE JOINT DISEASE 08/08/2007   LOW BACK PAIN, CHRONIC 08/08/2007   ANEMIA, HX OF 08/08/2007   Home Medication(s) Prior to Admission medications   Medication Sig Start Date End Date Taking? Authorizing Provider  ALPRAZolam Prudy Feeler) 0.5 MG tablet TAKE 1 TABLET BY MOUTH 3 TIMES A DAY AS NEEDED FOR NERVES 12/11/13   Michele Mcalpine, MD  amLODipine (NORVASC) 5 MG tablet TAKE 1 TABLET (5 MG TOTAL) BY MOUTH DAILY. 12/11/13   Michele Mcalpine, MD  Cholecalciferol (VITAMIN D) 1000 UNITS capsule Take 2,000 Units by mouth daily.     [provider]  cyclobenzaprine (FLEXERIL) 10 MG tablet Take 10 mg by mouth as needed for muscle spasms.    [provider]  diclofenac sodium (VOLTAREN) 1 % GEL APPLY 1 GM TO AFFECTED AREA UP TO 3 TIMES A DAY AS NEEDED 05/17/17  Pollyann Savoy, MD  fluticasone (FLONASE) 50 MCG/ACT nasal spray Place 2 sprays into the nose at bedtime. Patient not taking: Reported on 09/20/2018 03/31/12   Michele Mcalpine, MD  gabapentin (NEURONTIN) 100 MG capsule Take 1 capsule (100 mg total) by mouth at bedtime. 03/17/18   Gearldine Bienenstock, PA-C  glimepiride (AMARYL) 4 MG tablet TAKE 1 TABLET BY MOUTH EVERY DAY BEFORE BREAKFAST 12/11/13   Michele Mcalpine, MD  lisinopril (PRINIVIL,ZESTRIL) 40 MG tablet Take 40 mg by mouth daily. 03/13/17   [provider]  metoprolol  succinate (TOPROL-XL) 100 MG 24 hr tablet Take 1 tablet (100 mg total) by mouth daily. 12/11/13   Michele Mcalpine, MD  Multiple Vitamins-Minerals (PROTEGRA CARDIO PO) Take 1 capsule by mouth daily.      [provider]  rosuvastatin (CRESTOR) 40 MG tablet Take 40 mg by mouth daily. 02/23/17   [provider]  triamterene-hydrochlorothiazide (MAXZIDE-25) 37.5-25 MG per tablet TAKE 1 TABLET BY MOUTH DAILY. 06/19/12   Michele Mcalpine, MD  vitamin E 100 UNIT capsule Take by mouth daily.    [provider]                                                                                                                                    Past Surgical History Past Surgical History:  Procedure Laterality Date   ABDOMINAL HYSTERECTOMY     CORONARY ARTERY BYPASS GRAFT     3 vessel- Dr. Laneta Simmers 8/96   Family History Family History  Problem Relation Age of Onset   Heart disease Mother    Heart disease Brother     Social History Social History   Tobacco Use   Smoking status: Never   Smokeless tobacco: Never  Vaping Use   Vaping Use: Never used  Substance Use Topics   Alcohol use: No   Drug use: Never   Allergies Metformin  Review of Systems Review of Systems Unable to obtain due to somnolence Physical Exam Vital Signs  I have reviewed the triage vital signs BP 130/60   Pulse (!) 49   Resp (!) 24   Ht 5' (1.524 m)   Wt 100.2 kg   SpO2 99%   BMI 43.14 kg/m   Physical Exam Vitals reviewed.  Constitutional:      General: She is in acute distress.     Appearance: She is well-developed. She is morbidly obese. She is ill-appearing. She is not diaphoretic.  HENT:     Head: Normocephalic and atraumatic.     Nose: Nose normal.  Eyes:     General: No scleral icterus.       Right eye: No discharge.        Left eye: No discharge.     Conjunctiva/sclera: Conjunctivae normal.     Pupils: Pupils are equal, round, and reactive to light.  Cardiovascular:     Rate  and  Rhythm: Regular rhythm. Bradycardia present.     Heart sounds: No murmur heard.   No friction rub. No gallop.  Pulmonary:     Effort: Tachypnea, accessory muscle usage and respiratory distress present.     Breath sounds: Decreased air movement present. No stridor. Wheezing (faint insp) present. No rales.  Abdominal:     General: There is no distension.     Palpations: Abdomen is soft.     Tenderness: There is no abdominal tenderness.  Musculoskeletal:        General: No tenderness.     Cervical back: Normal range of motion and neck supple.  Skin:    General: Skin is warm and dry.     Findings: No erythema or rash.  Neurological:     Mental Status: She is lethargic.     Comments: Awakens to voice. Oriented x3 Follows commands Moves all extremities    ED Results and Treatments Labs (all labs ordered are listed, but only abnormal results are displayed) Labs Reviewed  CBC WITH DIFFERENTIAL/PLATELET - Abnormal; Notable for the following components:      Result Value   RBC 3.17 (*)    Hemoglobin 9.5 (*)    HCT 31.2 (*)    Platelets 138 (*)    All other components within normal limits  COMPREHENSIVE METABOLIC PANEL - Abnormal; Notable for the following components:   Sodium 133 (*)    Potassium 5.5 (*)    Glucose, Bld 249 (*)    BUN 50 (*)    Creatinine, Ser 1.38 (*)    Albumin 3.3 (*)    AST 60 (*)    ALT 55 (*)    GFR, Estimated 38 (*)    All other components within normal limits  BRAIN NATRIURETIC PEPTIDE - Abnormal; Notable for the following components:   B Natriuretic Peptide 603.3 (*)    All other components within normal limits  CBG MONITORING, ED - Abnormal; Notable for the following components:   Glucose-Capillary 215 (*)    All other components within normal limits  I-STAT VENOUS BLOOD GAS, ED - Abnormal; Notable for the following components:   pH, Ven 7.142 (*)    pCO2, Ven 81.8 (*)    pO2, Ven 94.0 (*)    Sodium 132 (*)    Potassium 5.5 (*)    HCT  30.0 (*)    Hemoglobin 10.2 (*)    All other components within normal limits  I-STAT VENOUS BLOOD GAS, ED - Abnormal; Notable for the following components:   pH, Ven 7.198 (*)    pCO2, Ven 73.4 (*)    pO2, Ven 56.0 (*)    Bicarbonate 28.6 (*)    Sodium 133 (*)    Potassium 5.8 (*)    HCT 31.0 (*)    Hemoglobin 10.5 (*)    All other components within normal limits  RESP PANEL BY RT-PCR (FLU A&B, COVID) ARPGX2  LACTIC ACID, PLASMA  URINALYSIS, ROUTINE W REFLEX MICROSCOPIC  RAPID URINE DRUG SCREEN, HOSP PERFORMED  HEMOGLOBIN A1C  I-STAT CHEM 8, ED  TROPONIN I (HIGH SENSITIVITY)  TROPONIN I (HIGH SENSITIVITY)  EKG  EKG Interpretation  Date/Time:  Monday July 14 2021 05:21:39 EST Ventricular Rate:  48 PR Interval:  164 QRS Duration: 126 QT Interval:  494 QTC Calculation: 442 R Axis:   68 Text Interpretation: Sinus bradycardia IVCD, consider atypical RBBB Inferior infarct, old Confirmed by Drema Pry [phone]) on 07/14/2021 5:26:17 AM       Radiology DG Chest Port 1 View  Result Date: 07/14/2021 CLINICAL DATA:  Shortness of breath. EXAM: PORTABLE CHEST 1 VIEW COMPARISON:  PA Lat 10/01/2011. FINDINGS: Old sternotomy and CABG change is again noted. The heart is enlarged. There is worsening perihilar vascular congestion and moderate interstitial edema. Alveolar infiltrates radiate into the mid and lower lung fields consistent with alveolar edema with underlying pneumonia difficult to exclude in the proper clinical setting. There are moderate right-greater-than-left pleural effusions, with overlying consolidation or atelectasis in the lower lung fields. IMPRESSION: Findings of CHF or fluid overload with pulmonary edema and moderate right-greater-than-left pleural effusions. Underlying pneumonia would be difficult to exclude in the proper clinical setting.  Clinical correlation and radiographic follow-up recommended. Electronically Signed   By: Almira Bar M.D.   On: 07/14/2021 05:47    Pertinent labs & imaging results that were available during my care of the patient were reviewed by me and considered in my medical decision making (see MDM for details).  Medications Ordered in ED Medications  insulin aspart (novoLOG) injection 0-6 Units (has no administration in time range)  furosemide (LASIX) injection 20 mg (20 mg Intravenous Given 07/14/21 0709)                                                                                                                                     Procedures .1-3 Lead EKG Interpretation Performed by: Nira Conn, MD Authorized by: Nira Conn, MD     Interpretation: abnormal     ECG rate:  55   ECG rate assessment: bradycardic     Rhythm: sinus bradycardia     Ectopy: none     Conduction: normal   .Critical Care Performed by: Nira Conn, MD Authorized by: Nira Conn, MD   Critical care provider statement:    Critical care time (minutes):  55   Critical care was necessary to treat or prevent imminent or life-threatening deterioration of the following conditions:  Respiratory failure and cardiac failure   Critical care was time spent personally by me on the following activities:  Development of treatment plan with patient or surrogate, discussions with consultants, evaluation of patient's response to treatment, examination of patient, ordering and review of laboratory studies, ordering and review of radiographic studies, ordering and performing treatments and interventions, pulse oximetry, re-evaluation of patient's condition and review of old charts  (including critical care time)  Medical Decision Making / ED Course I have reviewed the nursing notes for this encounter and the patient's prior records (if available in EHR  or on provided paperwork).  Erika Gates was evaluated in Emergency Department on 07/14/2021 for the symptoms described in the history of present illness. She was evaluated in the context of the global COVID-19 pandemic, which necessitated consideration that the patient might be at risk for infection with the SARS-CoV-2 virus that causes COVID-19. Institutional protocols and algorithms that pertain to the evaluation of patients at risk for COVID-19 are in a state of rapid change based on information released by regulatory bodies including the CDC and federal and state organizations. These policies and algorithms were followed during the patient's care in the ED.  Clinical Course as of 07/14/21 0759  Mon Jul 14, 2021  0702 Somnolence with respiratory distress. No prior history of CHF or COPD. Satting well on nonrebreather. Still somnolent but responsive. Patient is morbidly obese and has evidence of volume overload on exam.  Respiratory distress work-up initiated. VBG notable for respiratory acidosis with a CO2 of 81. Patient placed on BiPAP. Chest x-ray notable for evidence of heart failure which would be new diagnosis for the patient. Renal function intact.  Lasix ordered. Repeat VBG showed improved CO2.  Rested labs without leukocytosis. Patient has hyperglycemia without evidence of DKA. No significant electrolyte derangements noted to have mild hypokalemia.  No other significant electrolyte derangements.  COVID/influenza negative.  Consulted critical care given patient's somnolence. They will evaluate the patient and determine whether the patient requires admission to the ICU or if she would be appropriate for stepdown unit.   [PC]  0710 Patient care turned over to oncoming provider. Patient case and results discussed in detail; please see their note for further ED managment.     [PC]    Clinical Course User Index [PC] Carolan Avedisian, Amadeo Garnet, MD     Pertinent labs & imaging results that were available  during my care of the patient were reviewed by me and considered in my medical decision making:    Final Clinical Impression(s) / ED Diagnoses Final diagnoses:  SOB (shortness of breath)  Acute respiratory failure with hypoxia and hypercapnia (HCC)     This chart was dictated using voice recognition software.  Despite best efforts to proofread,  errors can occur which can change the documentation meaning.    Nira Conn, MD 07/14/21 [phone]

## 2021-07-14 NOTE — ED Provider Notes (Signed)
Signout from Dr. Eudelia Bunch.  86 year old female here with somnolence shortness of breath.  Currently on BiPAP.  ABG showing improvement in acidosis and CO2 retention.  She is pending consult from critical care.  If they are not admitting patient plan is to place consult for unassigned admission.  Full code Physical Exam  BP (!) 122/54   Pulse (!) 46   Resp 17   Ht 5' (1.524 m)   Wt 100.2 kg   SpO2 99%   BMI 43.14 kg/m   Physical Exam  ED Course/Procedures   Clinical Course as of 07/14/21 0839  Mon Jul 14, 2021  0702 Somnolence with respiratory distress. No prior history of CHF or COPD. Satting well on nonrebreather. Still somnolent but responsive. Patient is morbidly obese and has evidence of volume overload on exam.  Respiratory distress work-up initiated. VBG notable for respiratory acidosis with a CO2 of 81. Patient placed on BiPAP. Chest x-ray notable for evidence of heart failure which would be new diagnosis for the patient. Renal function intact.  Lasix ordered. Repeat VBG showed improved CO2.  Rested labs without leukocytosis. Patient has hyperglycemia without evidence of DKA. No significant electrolyte derangements noted to have mild hypokalemia.  No other significant electrolyte derangements.  COVID/influenza negative.  Consulted critical care given patient's somnolence. They will evaluate the patient and determine whether the patient requires admission to the ICU or if she would be appropriate for stepdown unit.   [PC]  0710 Patient care turned over to oncoming provider. Patient case and results discussed in detail; please see their note for further ED managment.     [PC]    Clinical Course User Index [PC] Cardama, Amadeo Garnet, MD    Procedures  MDM  Patient was seen by critical care and is being admitted to their service.       Terrilee Files, MD 07/14/21 1725

## 2021-07-14 NOTE — ED Notes (Signed)
Resp therapy at  the bedside  for c-pap

## 2021-07-14 NOTE — ED Notes (Signed)
The pt s color  is bettet and her breathing is less  labored faint wheezes better thaninitially

## 2021-07-14 NOTE — H&P (Addendum)
NAME:  Erika Gates, MRN:  [id number], DOB:  Jul 01, 1935, LOS: 0 ADMISSION DATE:  07/14/2021, CONSULTATION DATE:  11/21 REFERRING MD:  Dr. Eudelia Bunch, CHIEF COMPLAINT: AMS  History of Present Illness:  86 y/o F who presented to [facility] ER on 11/21 via EMS with reports of altered mental status.   At baseline, she lives independently.  Her grandson reports she no longer drives.  She walks with a cane and some assistance. He reports she has been working on her diet to for better glucose control.  They have noted she has been having low glucoses in the last few weeks and recently have decreased her glimepride to half a tablet per primary provider.  He has noted in the last 48 hours, she has been more short of breath.  He wanted to call EMS the evening of 11/20 but she thought she was ok.  No known fevers, chills.  They have noted increased swelling in her lower extremities despite compliance with diuretics.   AM of presentation, the patient was reportedly altered and in respiratory distress on EMS arrival.  She was noted to be wheezing and bradycardic.  Treated by EMS with albuterol, 2gm magnesium and 125mg  IV solumederol. On arrival, the patient was placed on BiPAP. VBG assessment showed 7.14 / 81.8.  Labs notable for Na 133, K 5.5, Cl 99, glucose 249, BUN 50, Cr 1.38, albumin 3.3, AST 60 / ALT 55, BNP 603, troponin 9, WBC 9, Hgb 9.5, and platelets 138. CXR showed bilateral pleural effusions, vascular congestion with interstitial edema with alveolar infiltrates.  COVID and influenza negative.   PCCM called for ICU admission.   Pertinent  Medical History  DM  CAD s/p 3 vessel CABG 1996 HLD  HTN Morbid Obesity  DJD Anemia  Anxiety   Significant Hospital Events: Including procedures, antibiotic start and stop dates in addition to other pertinent events   11/21 Admit to [facility] with  AMS in setting of hypercarbic respiratory failure  Interim History / Subjective:  As above  Objective   Blood  pressure (!) 122/54, pulse (!) 46, resp. rate 17, height 5' (1.524 m), weight 100.2 kg, SpO2 99 %.    Vent Mode: BIPAP FiO2 (%):  [40 %] 40 % Set Rate:  [15 bmp] 15 bmp PEEP:  [5 cmH20] 5 cmH20  No intake or output data in the 24 hours ending 07/14/21 0718 Filed Weights   07/14/21 0602  Weight: 100.2 kg    Examination: General: ill appearing adult female lying in bed on BiPAP in NAD HENT: MM pink/dry, BiPAP mask in place, anicteric Lungs: tachypnea with abdominal accessory muscle use, diminished breath sounds  but no wheezing  Cardiovascular: s1s2 distant, no appreciable m/r/g Abdomen: soft/non-tender, bsx4 active  Extremities: warm/dry, BLE 3+ pitting edema up to thighs  Neuro: drowsy, awakens to voice, nods / appropriate but drifts back to sleep. Not able to hold full conversation due to drowsiness    Resolved Hospital Problem list      Assessment & Plan:   Acute Hypercarbic Respiratory Failure  Suspected OHS  Suspect acute decompensation in setting of likely heart failure exacerbation.  No subjective hx of infectious symptoms.  COVID/influenza negative. Uses Benzo's PRN at home, flexeril & neurontin for back pain.  -admit to ICU, at risk intubation  -continue BiPAP support  -follow intermittent CXR  -empiric CAP coverage for now -assess RVP -hold home sedating medications, may be contributing to hypercarbia  -consider outpatient sleep evaluation   Suspected  Decompensated CHF Bradycardia  Hx CAD s/p 3 Vessel CABG, HTN, HLD Elevated BNP, mild elevation of LFT's raises question of congestive hepatopathy. Troponin flat. Huston Foley on lopressor.  -assess ECHO  -additional lasix 40mg  IV lasix now  -follow strict I/O's  -continue home norvasc, lopressor  -hold home lisinopril, maxzide, crestor  AKI Hyponatremia  Hyperkalemia  Prior labs noted to be normal cr but has been several years since checked -lasix as above for mildly elevated K+ -assess UA -follow up BMP at 1600  to review renal function / K+ -Trend BMP / urinary output -Replace electrolytes as indicated -Avoid nephrotoxic agents as able, ensure adequate renal perfusion  Acute Metabolic Encephalopathy  In setting of hypercarbia, AKI  -hold sedating medications  -bipap to correct hypercarbia  -assess UDS   DM with Hyper/Hypoglycemia Recent hx of hypoglycemia with reduction of home regimen -SSI, very sensitive scale  -assess Hgb A1c -hold home glimepride   Anemia  Thrombocytopenia  -follow CBC  -transfuse for Hgb <7% or concern for active  bleeding  Chronic Back Pain, Anxiety  -hold home flexeril, neurontin, PRN xanax   Best Practice (right click and "Reselect all SmartList Selections" daily)  Diet/type: NPO DVT prophylaxis: prophylactic heparin  GI prophylaxis: N/A Lines: N/A Foley:  N/A Code Status:  full code Last date of multidisciplinary goals of care discussion:  patient has living will that reportedly states DNR. However, she requests full code for now.   Labs   CBC: Recent Labs  Lab 07/14/21 0548 07/14/21 0601 07/14/21 0615  WBC 9.0  --   --   NEUTROABS 6.8  --   --   HGB 9.5* 10.2* 10.5*  HCT 31.2* 30.0* 31.0*  MCV 98.4  --   --   PLT 138*  --   --     Basic Metabolic Panel: Recent Labs  Lab 07/14/21 0548 07/14/21 0601 07/14/21 0615  NA 133* 132* 133*  K 5.5* 5.5* 5.8*  CL 99  --   --   CO2 26  --   --   GLUCOSE 249*  --   --   BUN 50*  --   --   CREATININE 1.38*  --   --   CALCIUM 9.5  --   --    GFR: Estimated Creatinine Clearance: 32.3 mL/min (A) (by C-G formula based on SCr of 1.38 mg/dL (H)). Recent Labs  Lab 07/14/21 0548  WBC 9.0  LATICACIDVEN 1.1    Liver Function Tests: Recent Labs  Lab 07/14/21 0548  AST 60*  ALT 55*  ALKPHOS 66  BILITOT 0.4  PROT 7.2  ALBUMIN 3.3*   No results for input(s): LIPASE, AMYLASE in the last 168 hours. No results for input(s): AMMONIA in the last 168 hours.  ABG    Component Value Date/Time    HCO3 28.6 (H) 07/14/2021 0615   TCO2 31 07/14/2021 0615   ACIDBASEDEF 1.0 07/14/2021 0615   O2SAT 80.0 07/14/2021 0615     Coagulation Profile: No results for input(s): INR, PROTIME in the last 168 hours.  Cardiac Enzymes: No results for input(s): CKTOTAL, CKMB, CKMBINDEX, TROPONINI in the last 168 hours.  HbA1C: Hgb A1c MFr Bld  Date/Time Value Ref Range Status  06/21/2013 10:53 AM 7.4 (H) 4.6 - 6.5 % Final    Comment:    Glycemic Control Guidelines for People with Diabetes:Non Diabetic:  <6%Goal of Therapy: <7%Additional Action Suggested:  >8%   12/16/2012 10:35 AM 7.2 (H) 4.6 - 6.5 % Final  Comment:    Glycemic Control Guidelines for People with Diabetes:Non Diabetic:  <6%Goal of Therapy: <7%Additional Action Suggested:  >8%     CBG: Recent Labs  Lab 07/14/21 0525  GLUCAP 215*    Review of Systems:   Unable to complete with patient due to AMS.  Information obtained from family at bedside, chart review and staff.  Past Medical History:  She,  has a past medical history of Allergic rhinitis, Anemia, Anxiety, Coronary atherosclerosis of unspecified type of vessel, native or graft, Diabetes mellitus, DJD (degenerative joint disease), Hypercholesteremia, Hypertension, Low back pain, Morbid obesity (HCC), Venous insufficiency, and Vitamin D deficiency.   Surgical History:   Past Surgical History:  Procedure Laterality Date   ABDOMINAL HYSTERECTOMY     CORONARY ARTERY BYPASS GRAFT     3 vessel- Dr. Laneta Simmers 8/96     Social History:   reports that she has never smoked. She has never used smokeless tobacco. She reports that she does not drink alcohol and does not use drugs.   Family History:  Her family history includes Heart disease in her brother and mother.   Allergies Allergies  Allergen Reactions   Metformin Other (See Comments)    REACTION: pt refuses METFORMIN "it made me sad"...     Home Medications  Prior to Admission medications   Medication Sig Start  Date End Date Taking? Authorizing Provider  ALPRAZolam Prudy Feeler) 0.5 MG tablet TAKE 1 TABLET BY MOUTH 3 TIMES A DAY AS NEEDED FOR NERVES 12/11/13   Michele Mcalpine, MD  amLODipine (NORVASC) 5 MG tablet TAKE 1 TABLET (5 MG TOTAL) BY MOUTH DAILY. 12/11/13   Michele Mcalpine, MD  Cholecalciferol (VITAMIN D) 1000 UNITS capsule Take 2,000 Units by mouth daily.     [provider]  cyclobenzaprine (FLEXERIL) 10 MG tablet Take 10 mg by mouth as needed for muscle spasms.    [provider]  diclofenac sodium (VOLTAREN) 1 % GEL APPLY 1 GM TO AFFECTED AREA UP TO 3 TIMES A DAY AS NEEDED 05/17/17   Pollyann Savoy, MD  fluticasone (FLONASE) 50 MCG/ACT nasal spray Place 2 sprays into the nose at bedtime. Patient not taking: Reported on 09/20/2018 03/31/12   Michele Mcalpine, MD  gabapentin (NEURONTIN) 100 MG capsule Take 1 capsule (100 mg total) by mouth at bedtime. 03/17/18   Gearldine Bienenstock, PA-C  glimepiride (AMARYL) 4 MG tablet TAKE 1 TABLET BY MOUTH EVERY DAY BEFORE BREAKFAST 12/11/13   Michele Mcalpine, MD  lisinopril (PRINIVIL,ZESTRIL) 40 MG tablet Take 40 mg by mouth daily. 03/13/17   [provider]  metoprolol succinate (TOPROL-XL) 100 MG 24 hr tablet Take 1 tablet (100 mg total) by mouth daily. 12/11/13   Michele Mcalpine, MD  Multiple Vitamins-Minerals (PROTEGRA CARDIO PO) Take 1 capsule by mouth daily.      [provider]  rosuvastatin (CRESTOR) 40 MG tablet Take 40 mg by mouth daily. 02/23/17   [provider]  triamterene-hydrochlorothiazide (MAXZIDE-25) 37.5-25 MG per tablet TAKE 1 TABLET BY MOUTH DAILY. 06/19/12   Michele Mcalpine, MD  vitamin E 100 UNIT capsule Take by mouth daily.    [provider]     Critical care time: 34 minutes     Canary Brim, MSN, APRN, NP-C, AGACNP-BC [facility] 07/14/2021, 7:18 AM   Please see [email or web address] for pager details.   From 7A-7P if no response, please call [phone] After hours, please call  ELink [phone]

## 2021-07-14 NOTE — ED Notes (Signed)
Patient is resting comfortably. 

## 2021-07-14 NOTE — Progress Notes (Signed)
MD at bedside evaluation. Placed on Bipap.

## 2021-07-14 NOTE — ED Notes (Signed)
ED TO INPATIENT HANDOFF REPORT  ED Nurse Name and Phone #: Michell Heinrich [phone]  S Name/Age/Gender Erika Gates 86 y.o. female Room/Bed: 026C/026C  Code Status   Code Status: Not on file  Home/SNF/Other Home Patient oriented to: self, place, time, and situation Is this baseline? Yes   Triage Complete: Triage complete  Chief Complaint Acute respiratory failure with hypoxia and hypercarbia (HCC) [J96.01, J96.02]  Triage Note The pt arrived by gems from home  she was in respiratory distress when ems arrived there   sats low no breath sounds evident  sinus brady  iv lt a-c  albuterol hhn on the way here solumedrol  125mg  iv mag sulfate 2 gm on the way here also   son arrived  and is at bedside at present  audible wheezes on arrival   Allergies Allergies  Allergen Reactions   Metformin Other (See Comments)    REACTION: pt refuses METFORMIN "it made me sad"...    Level of Care/Admitting Diagnosis ED Disposition     ED Disposition  Admit   Condition  --   Comment  Hospital Area: [facility] [[id number]]  Level of Care: ICU [6]  May admit patient to [facility] or [facility] if equivalent level of care is available:: No  Covid Evaluation: Confirmed COVID Negative  Diagnosis: Acute respiratory failure with hypoxia and hypercarbia [facility]) [facility]  Admitting Physician: [[id number]] Steffanie Dunn  Attending Physician: [[id number]] Steffanie Dunn  Estimated length of stay: 3 - 4 days  Certification:: I certify this patient will need inpatient services for at least 2 midnights          B Medical/Surgery History Past Medical History:  Diagnosis Date   Allergic rhinitis    Anemia    Anxiety    Coronary atherosclerosis of unspecified type of vessel, native or graft    Diabetes mellitus    DJD (degenerative joint disease)    Hypercholesteremia    Hypertension    Low back pain    Morbid obesity (HCC)    Venous insufficiency    Vitamin D deficiency    Past  Surgical History:  Procedure Laterality Date   ABDOMINAL HYSTERECTOMY     CORONARY ARTERY BYPASS GRAFT     3 vessel- Dr. [phone] 8/96     A IV Location/Drains/Wounds Patient Lines/Drains/Airways Status     Active Line/Drains/Airways     Name Placement date Placement time Site Days   Peripheral IV 07/14/21 20 G Left Antecubital 07/14/21  0530  Antecubital  less than 1   Peripheral IV 07/14/21 20 G Right Antecubital 07/14/21  0615  Antecubital  less than 1            Intake/Output Last 24 hours No intake or output data in the 24 hours ending 07/14/21 0816  Labs/Imaging Results for orders placed or performed during the hospital encounter of 07/14/21 (from the past 48 hour(s))  POC CBG, ED     Status: Abnormal   Collection Time: 07/14/21  5:25 AM  Result Value Ref Range   Glucose-Capillary 215 (H) 70 - 99 mg/dL    Comment: Glucose reference range applies only to samples taken after fasting for at least 8 hours.  Troponin I (High Sensitivity)     Status: None   Collection Time: 07/14/21  5:48 AM  Result Value Ref Range   Troponin I (High Sensitivity) 9 <18 ng/L    Comment: (NOTE) Elevated high sensitivity troponin I (hsTnI) values and  significant  changes across serial measurements may suggest ACS but many other  chronic and acute conditions are known to elevate hsTnI results.  Refer to the "Links" section for chest pain algorithms and additional  guidance. Performed at [facility], [address] [place], [place] [address]   CBC with Differential/Platelet     Status: Abnormal   Collection Time: 07/14/21  5:48 AM  Result Value Ref Range   WBC 9.0 4.0 - 10.5 K/uL   RBC 3.17 (L) 3.87 - 5.11 MIL/uL   Hemoglobin 9.5 (L) 12.0 - 15.0 g/dL   HCT 35.5 (L) 73.2 - 20.2 %   MCV 98.4 80.0 - 100.0 fL   MCH 30.0 26.0 - 34.0 pg   MCHC 30.4 30.0 - 36.0 g/dL   RDW 54.2 70.6 - 23.7 %   Platelets 138 (L) 150 - 400 K/uL   nRBC 0.0 0.0 - 0.2 %   Neutrophils Relative % 75 %    Neutro Abs 6.8 1.7 - 7.7 K/uL   Lymphocytes Relative 16 %   Lymphs Abs 1.5 0.7 - 4.0 K/uL   Monocytes Relative 8 %   Monocytes Absolute 0.7 0.1 - 1.0 K/uL   Eosinophils Relative 0 %   Eosinophils Absolute 0.0 0.0 - 0.5 K/uL   Basophils Relative 0 %   Basophils Absolute 0.0 0.0 - 0.1 K/uL   Immature Granulocytes 1 %   Abs Immature Granulocytes 0.06 0.00 - 0.07 K/uL    Comment: Performed at [facility], [address] [place], [place] [address]  Comprehensive metabolic panel     Status: Abnormal   Collection Time: 07/14/21  5:48 AM  Result Value Ref Range   Sodium 133 (L) 135 - 145 mmol/L   Potassium 5.5 (H) 3.5 - 5.1 mmol/L   Chloride 99 98 - 111 mmol/L   CO2 26 22 - 32 mmol/L   Glucose, Bld 249 (H) 70 - 99 mg/dL    Comment: Glucose reference range applies only to samples taken after fasting for at least 8 hours.   BUN 50 (H) 8 - 23 mg/dL   Creatinine, Ser 5.17 (H) 0.44 - 1.00 mg/dL   Calcium 9.5 8.9 - 61.6 mg/dL   Total Protein 7.2 6.5 - 8.1 g/dL   Albumin 3.3 (L) 3.5 - 5.0 g/dL   AST 60 (H) 15 - 41 U/L   ALT 55 (H) 0 - 44 U/L   Alkaline Phosphatase 66 38 - 126 U/L   Total Bilirubin 0.4 0.3 - 1.2 mg/dL   GFR, Estimated 38 (L) >60 mL/min    Comment: (NOTE) Calculated using the CKD-EPI Creatinine Equation (2021)    Anion gap 8 5 - 15    Comment: Performed at [facility], [address] [place], [place] [address]  Brain natriuretic peptide     Status: Abnormal   Collection Time: 07/14/21  5:48 AM  Result Value Ref Range   B Natriuretic Peptide 603.3 (H) 0.0 - 100.0 pg/mL    Comment: Performed at [facility], [address] [place], [place] [address]  Lactic acid, plasma     Status: None   Collection Time: 07/14/21  5:48 AM  Result Value Ref Range   Lactic Acid, Venous 1.1 0.5 - 1.9 mmol/L    Comment: Performed at [facility], [address], [place], [place] [address]  I-Stat venous blood gas, [facility] ED)     Status: Abnormal   Collection Time:  07/14/21  6:01 AM  Result Value Ref Range   pH, Ven 7.142 (LL) 7.250 - 7.430   pCO2, Ven 81.8 (HH) 44.0 - 60.0 mmHg   pO2, Ven 94.0 (H) 32.0 - 45.0 mmHg   Bicarbonate 28.0 20.0 - 28.0 mmol/L   TCO2 30 22 - 32 mmol/L   O2 Saturation 94.0 %   Acid-base deficit 2.0 0.0 - 2.0 mmol/L   Sodium 132 (L) 135 - 145 mmol/L   Potassium 5.5 (H) 3.5 - 5.1 mmol/L   Calcium, Ion 1.35 1.15 - 1.40 mmol/L   HCT 30.0 (L) 36.0 - 46.0 %   Hemoglobin 10.2 (L) 12.0 - 15.0 g/dL   Sample type VENOUS    Comment NOTIFIED PHYSICIAN   Resp Panel by RT-PCR (Flu A&B, Covid) Nasopharyngeal Swab     Status: None   Collection Time: 07/14/21  6:13 AM   Specimen: Nasopharyngeal Swab; Nasopharyngeal(NP) swabs in vial transport medium  Result Value Ref Range   SARS Coronavirus 2 by RT PCR NEGATIVE NEGATIVE    Comment: (NOTE) SARS-CoV-2 target nucleic acids are NOT DETECTED.  The SARS-CoV-2 RNA is generally detectable in upper respiratory specimens during the acute phase of infection. The lowest concentration of SARS-CoV-2 viral copies this assay can detect is 138 copies/mL. A negative result does not preclude SARS-Cov-2 infection and should not be used as the sole basis for treatment or other patient management decisions. A negative result may occur with  improper specimen collection/handling, submission of specimen other than nasopharyngeal swab, presence of viral mutation(s) within the areas targeted by this assay, and inadequate number of viral copies(<138 copies/mL). A negative result must be combined with clinical observations, patient history, and epidemiological information. The expected result is Negative.  Fact Sheet for Patients:  BloggerCourse.com  Fact Sheet for Healthcare Providers:  SeriousBroker.it  This test is no t yet approved or cleared by the [place] FDA and  has been authorized for detection and/or diagnosis of SARS-CoV-2 by FDA under  an Emergency Use Authorization (EUA). This EUA will remain  in effect (meaning this test can be used) for the duration of the COVID-19 declaration under Section 564(b)(1) of the Act, 21 U.S.C.section 360bbb-3(b)(1), unless the authorization is terminated  or revoked sooner.       Influenza A by PCR NEGATIVE NEGATIVE   Influenza B by PCR NEGATIVE NEGATIVE    Comment: (NOTE) The Xpert Xpress SARS-CoV-2/FLU/RSV plus assay is intended as an aid in the diagnosis of influenza from Nasopharyngeal swab specimens and should not be used as a sole basis for treatment. Nasal washings and aspirates are unacceptable for Xpert Xpress SARS-CoV-2/FLU/RSV testing.  Fact Sheet for Patients: BloggerCourse.com  Fact Sheet for Healthcare Providers: SeriousBroker.it  This test is not yet approved or cleared by the [place] FDA and has been authorized for detection and/or diagnosis of SARS-CoV-2 by FDA under an Emergency Use Authorization (EUA). This EUA will remain in effect (meaning this test can be used) for the duration of the COVID-19 declaration under Section 564(b)(1) of the Act, 21 U.S.C. section 360bbb-3(b)(1), unless the authorization is terminated or revoked.  Performed at [facility], [address], [place], [place] [address]   I-Stat venous blood gas, ED     Status: Abnormal   Collection Time: 07/14/21  6:15 AM  Result Value Ref Range   pH, Ven 7.198 (LL) 7.250 - 7.430   pCO2, Ven 73.4 (HH) 44.0 - 60.0 mmHg   pO2, Ven 56.0 (H) 32.0 -  45.0 mmHg   Bicarbonate 28.6 (H) 20.0 - 28.0 mmol/L   TCO2 31 22 - 32 mmol/L   O2 Saturation 80.0 %   Acid-base deficit 1.0 0.0 - 2.0 mmol/L   Sodium 133 (L) 135 - 145 mmol/L   Potassium 5.8 (H) 3.5 - 5.1 mmol/L   Calcium, Ion 1.29 1.15 - 1.40 mmol/L   HCT 31.0 (L) 36.0 - 46.0 %   Hemoglobin 10.5 (L) 12.0 - 15.0 g/dL   Sample type VENOUS    Comment NOTIFIED PHYSICIAN   CBG monitoring,  ED     Status: Abnormal   Collection Time: 07/14/21  8:03 AM  Result Value Ref Range   Glucose-Capillary 244 (H) 70 - 99 mg/dL    Comment: Glucose reference range applies only to samples taken after fasting for at least 8 hours.   DG Chest Port 1 View  Result Date: 07/14/2021 CLINICAL DATA:  Shortness of breath. EXAM: PORTABLE CHEST 1 VIEW COMPARISON:  PA Lat 10/01/2011. FINDINGS: Old sternotomy and CABG change is again noted. The heart is enlarged. There is worsening perihilar vascular congestion and moderate interstitial edema. Alveolar infiltrates radiate into the mid and lower lung fields consistent with alveolar edema with underlying pneumonia difficult to exclude in the proper clinical setting. There are moderate right-greater-than-left pleural effusions, with overlying consolidation or atelectasis in the lower lung fields. IMPRESSION: Findings of CHF or fluid overload with pulmonary edema and moderate right-greater-than-left pleural effusions. Underlying pneumonia would be difficult to exclude in the proper clinical setting. Clinical correlation and radiographic follow-up recommended. Electronically Signed   By: Almira Bar M.D.   On: 07/14/2021 05:47    Pending Labs Unresulted Labs (From admission, onward)     Start     Ordered   07/14/21 1600  Basic metabolic panel  Once-Timed,   TIMED        07/14/21 0811   07/14/21 0758  Hemoglobin A1c  Once,   R       Comments: To assess prior glycemic control    07/14/21 0757   07/14/21 0719  Urine rapid drug screen (hosp performed)  ONCE - STAT,   STAT        07/14/21 0718   07/14/21 0526  Urinalysis, Routine w reflex microscopic  Once,   STAT        07/14/21 0525            Vitals/Pain Today's Vitals   07/14/21 0714 07/14/21 0730 07/14/21 0759 07/14/21 0800  BP:  130/60 (!) 142/61   Pulse:  (!) 49 (!) 51   Resp:  (!) 24 (!) 29   SpO2:  99% 97% 100%  Weight:      Height:      PainSc: 6        Isolation Precautions No  active isolations  Medications Medications  insulin aspart (novoLOG) injection 0-6 Units (has no administration in time range)  furosemide (LASIX) injection 40 mg (has no administration in time range)  furosemide (LASIX) injection 20 mg (20 mg Intravenous Given 07/14/21 3893)    Mobility walks with device High fall risk   Focused Assessments Pulmonary Assessment Handoff:  Lung sounds: Bilateral Breath Sounds: Diminished, Expiratory wheezes O2 Device: Bi-PAP      R Recommendations: See Admitting Provider Note  Report given to:   Additional Notes: none

## 2021-07-15 ENCOUNTER — Inpatient Hospital Stay (HOSPITAL_COMMUNITY): Payer: Medicare PPO

## 2021-07-15 DIAGNOSIS — J9602 Acute respiratory failure with hypercapnia: Secondary | ICD-10-CM | POA: Diagnosis not present

## 2021-07-15 DIAGNOSIS — J9601 Acute respiratory failure with hypoxia: Secondary | ICD-10-CM | POA: Diagnosis not present

## 2021-07-15 LAB — GLUCOSE, CAPILLARY
Glucose-Capillary: 106 mg/dL — ABNORMAL HIGH (ref 70–99)
Glucose-Capillary: 122 mg/dL — ABNORMAL HIGH (ref 70–99)
Glucose-Capillary: 132 mg/dL — ABNORMAL HIGH (ref 70–99)
Glucose-Capillary: 146 mg/dL — ABNORMAL HIGH (ref 70–99)
Glucose-Capillary: 199 mg/dL — ABNORMAL HIGH (ref 70–99)
Glucose-Capillary: 98 mg/dL (ref 70–99)

## 2021-07-15 LAB — CBC
HCT: 29.9 % — ABNORMAL LOW (ref 36.0–46.0)
Hemoglobin: 9.1 g/dL — ABNORMAL LOW (ref 12.0–15.0)
MCH: 29.1 pg (ref 26.0–34.0)
MCHC: 30.4 g/dL (ref 30.0–36.0)
MCV: 95.5 fL (ref 80.0–100.0)
Platelets: 130 10*3/uL — ABNORMAL LOW (ref 150–400)
RBC: 3.13 MIL/uL — ABNORMAL LOW (ref 3.87–5.11)
RDW: 14.1 % (ref 11.5–15.5)
WBC: 7.3 10*3/uL (ref 4.0–10.5)
nRBC: 0.3 % — ABNORMAL HIGH (ref 0.0–0.2)

## 2021-07-15 LAB — BASIC METABOLIC PANEL
Anion gap: 6 (ref 5–15)
BUN: 51 mg/dL — ABNORMAL HIGH (ref 8–23)
CO2: 26 mmol/L (ref 22–32)
Calcium: 9.8 mg/dL (ref 8.9–10.3)
Chloride: 103 mmol/L (ref 98–111)
Creatinine, Ser: 1.26 mg/dL — ABNORMAL HIGH (ref 0.44–1.00)
GFR, Estimated: 42 mL/min — ABNORMAL LOW (ref >60)
Glucose, Bld: 164 mg/dL — ABNORMAL HIGH (ref 70–99)
Potassium: 5.6 mmol/L — ABNORMAL HIGH (ref 3.5–5.1)
Sodium: 135 mmol/L (ref 135–145)

## 2021-07-15 LAB — PROCALCITONIN: Procalcitonin: <0.1 ng/mL

## 2021-07-15 LAB — PHOSPHORUS: Phosphorus: 4.5 mg/dL (ref 2.5–4.6)

## 2021-07-15 LAB — POCT I-STAT, CHEM 8
BUN: 70 mg/dL — ABNORMAL HIGH (ref 8–23)
Calcium, Ion: 1.32 mmol/L (ref 1.15–1.40)
Chloride: 101 mmol/L (ref 98–111)
Creatinine, Ser: 1.4 mg/dL — ABNORMAL HIGH (ref 0.44–1.00)
Glucose, Bld: 249 mg/dL — ABNORMAL HIGH (ref 70–99)
HCT: 32 % — ABNORMAL LOW (ref 36.0–46.0)
Hemoglobin: 10.9 g/dL — ABNORMAL LOW (ref 12.0–15.0)
Potassium: 5.9 mmol/L — ABNORMAL HIGH (ref 3.5–5.1)
Sodium: 133 mmol/L — ABNORMAL LOW (ref 135–145)
TCO2: 30 mmol/L (ref 22–32)

## 2021-07-15 LAB — MAGNESIUM: Magnesium: 3 mg/dL — ABNORMAL HIGH (ref 1.7–2.4)

## 2021-07-15 LAB — BRAIN NATRIURETIC PEPTIDE: B Natriuretic Peptide: 691.8 pg/mL — ABNORMAL HIGH (ref 0.0–100.0)

## 2021-07-15 MED ORDER — FUROSEMIDE 10 MG/ML IJ SOLN
40.0000 mg | Freq: Once | INTRAMUSCULAR | Status: AC
  Administered 2021-07-15: 40 mg via INTRAVENOUS
  Filled 2021-07-15: qty 4

## 2021-07-15 MED ORDER — LIP MEDEX EX OINT
TOPICAL_OINTMENT | CUTANEOUS | Status: DC | PRN
  Filled 2021-07-15: qty 7

## 2021-07-15 MED ORDER — AZITHROMYCIN 500 MG PO TABS
500.0000 mg | ORAL_TABLET | Freq: Every day | ORAL | Status: DC
Start: 2021-07-15 — End: 2021-07-15
  Filled 2021-07-15: qty 1

## 2021-07-15 NOTE — Progress Notes (Signed)
NAME:  XITLALLY MOONEYHAM, MRN:  [id number], DOB:  1935-07-25, LOS: 1 ADMISSION DATE:  07/14/2021, CONSULTATION DATE:  07/14/2021 REFERRING MD:  Eudelia Bunch, CHIEF COMPLAINT:  AMS   History of Present Illness:  Erika Gates is a 86 y.o. F who presented to [facility] ED with concerns of altered mental status. According to her family she is independent at baseline though she no longer drives and and uses a cane with assistance. They state that lately she has been having low glucose levels over the last several weeks and as a result have decreased her home regimen of glimepiride to half as directed by her PCP. Her son states that in the 48h prior to presenting to the ED she had been having worsened shortness of breath, increased LE edema. On morning of presentation she was altered and in respiratory distress. EMS noted wheezing and bradycardia and treated her with albuterol, magnesium, and solumedrol IV. On arrival to [facility] ED she was placed on BiPAP. Initial work-up included VBG assessment showed 7.14 / 81.8. , labs notable for Na 133, K 5.5, Cl 99, glucose 249, BUN 50, Cr 1.38, albumin 3.3, AST 60 / ALT 55, BNP 603, troponin 9, WBC 9, Hgb 9.5, and platelets 138, CXR showed bilateral pleural effusions, vascular congestion with interstitial edema with alveolar infiltrates, COVID and influenza negative. PCCM was consulted at this time for management of acute hypercarbic respiratory failure.  Pertinent  Medical History  T2DM, CAD s/p 3 vessel CABG 1996, HLD, HTN, morbid obesity, DJD, anemia, anxiety  Significant Hospital Events: Including procedures, antibiotic start and stop dates in addition to other pertinent events   11/21 Admit to [facility] with  AMS in setting of hypercarbic respiratory failure  Interim History / Subjective:  Patient was eager to have BiPAP removed this morning so we transitioned her to 3L [place] which she is tolerating well. She does not have any complaints or concerns at this time.   Objective    Blood pressure (!) 130/57, pulse 91, temperature 97.9 F (36.6 C), temperature source Axillary, resp. rate 19, height 5' (1.524 m), weight 112.6 kg, SpO2 95 %.    Vent Mode: PCV;BIPAP FiO2 (%):  [40 %] 40 % Set Rate:  [15 bmp] 15 bmp PEEP:  [5 cmH20] 5 cmH20 Plateau Pressure:  [11 cmH20] 11 cmH20   Intake/Output Summary (Last 24 hours) at 07/15/2021 0939 Last data filed at 07/15/2021 0600 Gross per 24 hour  Intake 369.46 ml  Output 1050 ml  Net -680.54 ml   Filed Weights   07/14/21 0602 07/15/21 0600  Weight: 100.2 kg 112.6 kg    Examination: Constitutional: Elderly female in no acute distress. Cardio: Regular rate and rhythm. No murmurs, rubs, gallops. Pulm: Clear to auscultation bilaterally. Tolerating 3L [place] well.  Abdomen: Soft, non-tender, non-distended. MSK: Bilateral 1+ LE pitting edema. Skin: Skin is warm and dry. Neuro: Alert and oriented x 3. No focal deficit noted. Psych: Appropriate mood and affect.  Resolved Hospital Problem list   Hyponatremia Bradycardia Acute metabolic encephalopathy  Assessment & Plan:  Acute Hypercarbic Respiratory Failure  Patient is negative for COVID-19, flu. Full respiratory panel has not been drawn. She is tolerating being off of BiPAP and on [place] at 3L/min.  - Continue [place] as tolerated with BiPAP prn, at night. - Given clinical improvement, no leukocytosis, afebrile, procalcitonin <0.10 we will discontinue CAP coverage. - Patient does not report wearing BiPAP at home during the night. Consider outpatient sleep study referral.   Acute  on chronic CHF Hx CAD s/p 3 Vessel CABG, HTN, HLD Echo 11/21 showed left ventricular ejection fraction, by estimation, is 60  to 65% with normal function, no regional wall motion abnormalities, mild left ventricular hypertrophy\ consistent with Grade I diastolic dysfunction (impaired relaxation) and elevated left ventricular end-diastolic pressure; right ventricular size is normal with no increase in  right ventricular wall thickness and mildly reduced systolic function; mildly elevated pulmonary artery systolic pressure.  Elevated BNP, mild elevation of LFT's raises question of congestive hepatopathy. - Will give additional dose of Lasix 40 mg IV - Strict I/Os - Start home norvasc, lopressor - Continue holding home lisinopril, maxzide, crestor   AKI Hyperkalemia  Cr improved to 1.26 today however K continues to be elevated at 5.6. This did respond to Lasix yesterday. - Will give additional dose of Lasix 40 mg IV - Trend BMP - Replete electrolytes as needed -Avoid nephrotoxic agents as able, ensure adequate renal perfusion   Type 2 diabetes mellitus Patient's family reports recent history of hypoglycemic episodes on current home regimen. HbA1c 5.6%. -SSI, very sensitive scale  -Continue to hold home glimepride    Anemia  Thrombocytopenia  - Trend CBC  - Transfuse for Hgb <7% or concern for active bleeding   Chronic Back Pain, Anxiety  - Holding home flexeril, Neurontin - PRN xanax   Best Practice (right click and "Reselect all SmartList Selections" daily)   Diet/type: Regular consistency (see orders) DVT prophylaxis: prophylactic heparin  GI prophylaxis: N/A Lines: N/A Foley:  N/A Code Status:  full code Last date of multidisciplinary goals of care discussion [11/21]  Labs   CBC: Recent Labs  Lab 07/14/21 0548 07/14/21 0601 07/14/21 0608 07/14/21 0615 07/14/21 1518 07/15/21 0056  WBC 9.0  --   --   --   --  7.3  NEUTROABS 6.8  --   --   --   --   --   HGB 9.5* 10.2* 10.9* 10.5* 10.5* 9.1*  HCT 31.2* 30.0* 32.0* 31.0* 31.0* 29.9*  MCV 98.4  --   --   --   --  95.5  PLT 138*  --   --   --   --  130*    Basic Metabolic Panel: Recent Labs  Lab 07/14/21 0548 07/14/21 0601 07/14/21 0608 07/14/21 0615 07/14/21 1518 07/14/21 1547 07/15/21 0056  NA 133*   < > 133* 133* 134* 133* 135  K 5.5*   < > 5.9* 5.8* 5.7* 6.1* 5.6*  CL 99  --  101  --   --  99 103   CO2 26  --   --   --   --  23 26  GLUCOSE 249*  --  249*  --   --  217* 164*  BUN 50*  --  70*  --   --  55* 51*  CREATININE 1.38*  --  1.40*  --   --  1.39* 1.26*  CALCIUM 9.5  --   --   --   --  9.7 9.8  MG  --   --   --   --   --   --  3.0*  PHOS  --   --   --   --   --   --  4.5   < > = values in this interval not displayed.   GFR: Estimated Creatinine Clearance: 37.9 mL/min (A) (by C-G formula based on SCr of 1.26 mg/dL (H)). Recent Labs  Lab 07/14/21 [phone]  07/14/21 0824 07/15/21 0056  PROCALCITON  --  <0.10 <0.10  WBC 9.0  --  7.3  LATICACIDVEN 1.1  --   --     Liver Function Tests: Recent Labs  Lab 07/14/21 0548  AST 60*  ALT 55*  ALKPHOS 66  BILITOT 0.4  PROT 7.2  ALBUMIN 3.3*   No results for input(s): LIPASE, AMYLASE in the last 168 hours. No results for input(s): AMMONIA in the last 168 hours.  ABG    Component Value Date/Time   PHART 7.247 (L) 07/14/2021 1518   PCO2ART 58.4 (H) 07/14/2021 1518   PO2ART 52 (L) 07/14/2021 1518   HCO3 25.5 07/14/2021 1518   TCO2 27 07/14/2021 1518   ACIDBASEDEF 2.0 07/14/2021 1518   O2SAT 81.0 07/14/2021 1518     Coagulation Profile: No results for input(s): INR, PROTIME in the last 168 hours.  Cardiac Enzymes: No results for input(s): CKTOTAL, CKMB, CKMBINDEX, TROPONINI in the last 168 hours.  HbA1C: Hgb A1c MFr Bld  Date/Time Value Ref Range Status  07/14/2021 08:24 AM 5.6 4.8 - 5.6 % Final    Comment:    (NOTE) Pre diabetes:          5.7%-6.4%  Diabetes:              >6.4%  Glycemic control for   <7.0% adults with diabetes   06/21/2013 10:53 AM 7.4 (H) 4.6 - 6.5 % Final    Comment:    Glycemic Control Guidelines for People with Diabetes:Non Diabetic:  <6%Goal of Therapy: <7%Additional Action Suggested:  >8%     CBG: Recent Labs  Lab 07/14/21 1611 07/14/21 1956 07/14/21 2329 07/15/21 0323 07/15/21 0720  GLUCAP 203* 186* 150* 146* 122*    Review of Systems:   Review of Systems   Constitutional:  Positive for chills. Negative for fever.  Respiratory:  Negative for cough, shortness of breath and wheezing.   Cardiovascular:  Negative for chest pain and palpitations.  Gastrointestinal:  Negative for abdominal pain, constipation and diarrhea.  Genitourinary:  Negative for dysuria.  Musculoskeletal:  Negative for myalgias.  Neurological:  Negative for headaches.    Past Medical History:  She,  has a past medical history of Allergic rhinitis, Anemia, Anxiety, Coronary atherosclerosis of unspecified type of vessel, native or graft, Diabetes mellitus, DJD (degenerative joint disease), Hypercholesteremia, Hypertension, Low back pain, Morbid obesity (HCC), Venous insufficiency, and Vitamin D deficiency.   Surgical History:   Past Surgical History:  Procedure Laterality Date   ABDOMINAL HYSTERECTOMY     CORONARY ARTERY BYPASS GRAFT     3 vessel- Dr. Laneta SimmersBartle 8/96     Social History:   reports that she has never smoked. She has never used smokeless tobacco. She reports that she does not drink alcohol and does not use drugs.   Family History:  Her family history includes Heart disease in her brother and mother.   Allergies Allergies  Allergen Reactions   Metformin Other (See Comments)    REACTION: pt refuses METFORMIN "it made me sad"...     Home Medications  Prior to Admission medications   Medication Sig Start Date End Date Taking? Authorizing Provider  acetaminophen (TYLENOL) 500 MG tablet Take 1,000 mg by mouth every 6 (six) hours as needed for mild pain.   Yes [provider]  ALPRAZolam (XANAX) 0.5 MG tablet TAKE 1 TABLET BY MOUTH 3 TIMES A DAY AS NEEDED FOR NERVES Patient taking differently: Take 0.5 mg by mouth in the morning  and at bedtime. 12/11/13  Yes Noralee Space, MD  amLODipine (NORVASC) 10 MG tablet Take 10 mg by mouth daily. 05/03/21  Yes [provider]  aspirin EC 81 MG tablet Take 81 mg by mouth daily. Swallow whole.   Yes  [provider]  Cholecalciferol (VITAMIN D) 1000 UNITS capsule Take 2,000 Units by mouth daily.    Yes [provider]  cloNIDine (CATAPRES) 0.1 MG tablet Take 0.1 mg by mouth at bedtime. 05/03/21  Yes [provider]  cyclobenzaprine (FLEXERIL) 10 MG tablet Take 10 mg by mouth 2 (two) times daily as needed for muscle spasms.   Yes [provider]  diclofenac sodium (VOLTAREN) 1 % GEL APPLY 1 GM TO AFFECTED AREA UP TO 3 TIMES A DAY AS NEEDED 05/17/17  Yes Deveshwar, Abel Presto, MD  gabapentin (NEURONTIN) 100 MG capsule Take 1 capsule (100 mg total) by mouth at bedtime. Patient taking differently: Take 100 mg by mouth daily. 03/17/18  Yes Ofilia Neas, PA-C  gabapentin (NEURONTIN) 300 MG capsule Take 300 mg by mouth at bedtime. 05/24/21  Yes [provider]  glimepiride (AMARYL) 2 MG tablet Take 2 mg by mouth daily. 06/10/21  Yes [provider]  ibuprofen (ADVIL) 200 MG tablet Take 400 mg by mouth every 6 (six) hours as needed for mild pain or headache.   Yes [provider]  lisinopril (PRINIVIL,ZESTRIL) 40 MG tablet Take 40 mg by mouth daily. 03/13/17  Yes [provider]  metoprolol succinate (TOPROL-XL) 100 MG 24 hr tablet Take 1 tablet (100 mg total) by mouth daily. 12/11/13  Yes Noralee Space, MD  Multiple Vitamins-Minerals (PROTEGRA CARDIO PO) Take 1 capsule by mouth daily.     Yes [provider]  rosuvastatin (CRESTOR) 40 MG tablet Take 40 mg by mouth daily. 02/23/17  Yes [provider]  triamterene-hydrochlorothiazide (MAXZIDE-25) 37.5-25 MG per tablet TAKE 1 TABLET BY MOUTH DAILY. Patient taking differently: Take 1 tablet by mouth daily. 06/19/12  Yes Noralee Space, MD  vitamin E 100 UNIT capsule Take 100 Units by mouth daily.   Yes [provider]     Critical care time: 35 minutes

## 2021-07-15 NOTE — Progress Notes (Incomplete)
Attending note: I have seen and examined the patient. History, labs and imaging reviewed.  86 Y/O with CAD, DM admitted with hypercarbic resp failure, hypoglycemia, pulmonary edema    Blood pressure 128/81, pulse 88, temperature 97.9 F (36.6 C), temperature source Axillary, resp. rate 17, height 5' (1.524 m), weight 112.6 kg, SpO2 99 %. Gen:      No acute distress HEENT:  EOMI, sclera anicteric Neck:     No masses; no thyromegaly Lungs:    Clear to auscultation bilaterally; normal respiratory effort CV:         Regular rate and rhythm; no murmurs Abd:      + bowel sounds; soft, non-tender; no palpable masses, no distension Ext:    No edema; adequate peripheral perfusion Skin:      Warm and dry; no rash Neuro: alert and oriented x 3 Psych: normal mood and affect   Labs/Imaging personally reviewed, significant for   Assessment/plan: Acute resp failure secondary to HFPEF Ok to dc antibiotics as suspicion for pneumonia  Continue lasix diureisis  Hyperkalemia Follow after repeat Lasix dose  The patient is critically ill with multiple organ systems failure and requires high complexity decision making for assessment and support, frequent evaluation and titration of therapies, application of advanced monitoring technologies and extensive interpretation of multiple databases.  Critical care time - 35 mins. This represents my time independent of the NPs time taking care of the pt.  Chilton Greathouse MD [facility] 07/15/2021, 9:02 AM

## 2021-07-16 LAB — CBC
HCT: 30.1 % — ABNORMAL LOW (ref 36.0–46.0)
Hemoglobin: 9.3 g/dL — ABNORMAL LOW (ref 12.0–15.0)
MCH: 29 pg (ref 26.0–34.0)
MCHC: 30.9 g/dL (ref 30.0–36.0)
MCV: 93.8 fL (ref 80.0–100.0)
Platelets: 138 10*3/uL — ABNORMAL LOW (ref 150–400)
RBC: 3.21 MIL/uL — ABNORMAL LOW (ref 3.87–5.11)
RDW: 14.8 % (ref 11.5–15.5)
WBC: 10.5 10*3/uL (ref 4.0–10.5)
nRBC: 0.2 % (ref 0.0–0.2)

## 2021-07-16 LAB — BASIC METABOLIC PANEL
Anion gap: 7 (ref 5–15)
BUN: 37 mg/dL — ABNORMAL HIGH (ref 8–23)
CO2: 28 mmol/L (ref 22–32)
Calcium: 9.9 mg/dL (ref 8.9–10.3)
Chloride: 103 mmol/L (ref 98–111)
Creatinine, Ser: 1.16 mg/dL — ABNORMAL HIGH (ref 0.44–1.00)
GFR, Estimated: 46 mL/min — ABNORMAL LOW (ref >60)
Glucose, Bld: 104 mg/dL — ABNORMAL HIGH (ref 70–99)
Potassium: 4.5 mmol/L (ref 3.5–5.1)
Sodium: 138 mmol/L (ref 135–145)

## 2021-07-16 LAB — RESPIRATORY PANEL BY PCR

## 2021-07-16 LAB — GLUCOSE, CAPILLARY
Glucose-Capillary: 154 mg/dL — ABNORMAL HIGH (ref 70–99)
Glucose-Capillary: 99 mg/dL (ref 70–99)
Glucose-Capillary: 142 mg/dL — ABNORMAL HIGH (ref 70–99)
Glucose-Capillary: 136 mg/dL — ABNORMAL HIGH (ref 70–99)
Glucose-Capillary: 161 mg/dL — ABNORMAL HIGH (ref 70–99)

## 2021-07-16 LAB — PROCALCITONIN: Procalcitonin: <0.1 ng/mL

## 2021-07-16 MED ORDER — FLUTICASONE PROPIONATE 50 MCG/ACT NA SUSP
2.0000 | Freq: Every day | NASAL | Status: DC
Start: 2021-07-16 — End: 2021-07-18
  Administered 2021-07-16 – 2021-07-17 (×2): 2 via NASAL
  Filled 2021-07-16: qty 16

## 2021-07-16 MED ORDER — ALPRAZOLAM 0.5 MG PO TABS
0.5000 mg | ORAL_TABLET | Freq: Two times a day (BID) | ORAL | Status: DC | PRN
  Administered 2021-07-16: 0.5 mg via ORAL
  Filled 2021-07-16: qty 1

## 2021-07-16 NOTE — Evaluation (Signed)
Physical Therapy Evaluation Patient Details Name: Erika Gates MRN: [id number] DOB: 05-21-1935 Today's Date: 07/16/2021  History of Present Illness  pt is an 86 y/o female presenting with confusion and respiratory failure.  PMHx CAD s/p CABG,, DM  Clinical Impression  Pt admitted with/for confusion and resp failure.  She stays at home for hours alone each day, but is needing minimal assist on evaluation for safety.  Pt currently limited functionally due to the problems listed below.  (see problems list.)  Pt will benefit from PT to maximize function and safety to be able to get home safely with available assist.        Recommendations for follow up therapy are one component of a multi-disciplinary discharge planning process, led by the attending physician.  Recommendations may be updated based on patient status, additional functional criteria and insurance authorization.  Follow Up Recommendations Home health PT    Assistance Recommended at Discharge Intermittent Supervision/Assistance  Functional Status Assessment Patient has had a recent decline in their functional status and demonstrates the ability to make significant improvements in function in a reasonable and predictable amount of time.  Equipment Recommendations  BSC/3in1;Other (comment) (tub bench or seat)    Recommendations for Other Services       Precautions / Restrictions Precautions Precautions: Fall      Mobility  Bed Mobility Overal bed mobility: Needs Assistance Bed Mobility: Supine to Sit     Supine to sit: Min guard     General bed mobility comments: extra time, but no assist needed.    Transfers Overall transfer level: Needs assistance   Transfers: Sit to/from Stand;Bed to chair/wheelchair/BSC Sit to Stand: Min assist Stand pivot transfers: Min assist         General transfer comment: extra time, cues for hand placement/safety, steady assist    Ambulation/Gait Ambulation/Gait assistance:  Min assist Gait Distance (Feet): 30 Feet Assistive device: Rolling walker (2 wheels) Gait Pattern/deviations: Step-through pattern   Gait velocity interpretation: <1.31 ft/sec, indicative of household ambulator   General Gait Details: cues for posture and proximity to the RW, stability assist and controlled RW speed.  slower, mildly unsteady, step through gait  Stairs            Wheelchair Mobility    Modified Rankin (Stroke Patients Only)       Balance Overall balance assessment: Needs assistance Sitting-balance support: No upper extremity supported;Feet supported Sitting balance-Leahy Scale: Fair     Standing balance support: Bilateral upper extremity supported;Single extremity supported;During functional activity Standing balance-Leahy Scale: Poor Standing balance comment: reliant on AD and/or external support                             Pertinent Vitals/Pain Pain Assessment: No/denies pain    Home Living Family/patient expects to be discharged to:: Private residence Living Arrangements: Other relatives;Other (Comment) (grandson) Available Help at Discharge: Family;Available PRN/intermittently Type of Home: House Home Access: Stairs to enter Entrance Stairs-Rails: Doctor, general practice of Steps: 5   Home Layout: One level Home Equipment: Agricultural consultant (2 wheels);Cane - quad;Wheelchair - manual      Prior Function Prior Level of Function : Independent/Modified Independent;Needs assist       Physical Assist : Mobility (physical);ADLs (physical) Mobility (physical): Gait;Stairs (help on stairs and AD for gait) ADLs (physical): Bathing         Hand Dominance        Extremity/Trunk Assessment  Upper Extremity Assessment Upper Extremity Assessment: Generalized weakness    Lower Extremity Assessment Lower Extremity Assessment: Generalized weakness    Cervical / Trunk Assessment Cervical / Trunk Assessment: Normal   Communication   Communication: No difficulties  Cognition Arousal/Alertness: Awake/alert Behavior During Therapy: WFL for tasks assessed/performed Overall Cognitive Status:  (NT formally, improving from admission, not sure if baseline)                                          General Comments      Exercises     Assessment/Plan    PT Assessment Patient needs continued PT services  PT Problem List Decreased strength;Decreased activity tolerance;Decreased balance;Decreased mobility;Decreased knowledge of use of DME;Cardiopulmonary status limiting activity       PT Treatment Interventions Gait training;Functional mobility training;Therapeutic activities;Balance training;Patient/family education;DME instruction    PT Goals (Current goals can be found in the Care Plan section)  Acute Rehab PT Goals Patient Stated Goal: I want to go home PT Goal Formulation: With patient Time For Goal Achievement: 07/30/21 Potential to Achieve Goals: Good    Frequency Min 3X/week   Barriers to discharge Decreased caregiver support      Co-evaluation               AM-PAC PT "6 Clicks" Mobility  Outcome Measure Help needed turning from your back to your side while in a flat bed without using bedrails?: A Little Help needed moving from lying on your back to sitting on the side of a flat bed without using bedrails?: A Little Help needed moving to and from a bed to a chair (including a wheelchair)?: A Little Help needed standing up from a chair using your arms (e.g., wheelchair or bedside chair)?: A Little Help needed to walk in hospital room?: A Little Help needed climbing 3-5 steps with a railing? : A Lot 6 Click Score: 17    End of Session Equipment Utilized During Treatment: Oxygen Activity Tolerance: Patient tolerated treatment well;Patient limited by fatigue Patient left: in chair;with call bell/phone within reach;with chair alarm set Nurse Communication:  Mobility status PT Visit Diagnosis: Unsteadiness on feet (R26.81);Other abnormalities of gait and mobility (R26.89);Muscle weakness (generalized) (M62.81);Difficulty in walking, not elsewhere classified (R26.2)    Time: 3500-9381 PT Time Calculation (min) (ACUTE ONLY): 20 min   Charges:   PT Evaluation $PT Eval Moderate Complexity: 1 Mod          07/16/2021  Jacinto Halim., PT Acute Rehabilitation Services [phone]  (pager) [phone]  (office)  Eliseo Gum Tiffannie Sloss 07/16/2021, 3:03 PM

## 2021-07-16 NOTE — Progress Notes (Signed)
Erika Gates  PROGRESS NOTE  Erika Gates [id number] DOB: 08-Oct-1934 DOA: 07/14/2021 PCP: Alvester Chou, NP   Brief HPI:   86 year old female came to ED with concerns of altered mental status.  Patient was having low blood glucose levels over past several weeks so her home regimen of glimepiride was reduced to half as directed by her PCP.  Also she had developed worsening shortness of breath, increased lower extremity edema.  In the ED she was placed on BiPAP.  Chest x-ray showed bilateral pleural effusions, vascular congestion with interstitial edema with alveolar infiltrates.  COVID and influenza tested negative.  TRH resumed care on 07/16/2021  Subjective   Patient seen and examined, denies shortness of breath.   Assessment/Plan:     Acute hypercapnic respiratory failure -Patient was started on BiPAP which has been weaned off -Currently requiring oxygen via nasal cannula 2 L/min; continue BiPAP as needed at night -She was empirically started on antibiotics for community-acquired pneumonia -Antibiotics stopped as she was afebrile, procalcitonin less than 0.10 -Consider sleep study as outpatient  Acute on chronic diastolic CHF -History of CAD s/p three-vessel CABG -Echo from 11/21 showed left ventricle ejection fraction of 60-65 percent -Grade 1 diastolic dysfunction -Diuresed well with IV Lasix, net -4.9 L -Continue Norvasc, Lopressor -Lisinopril, Maxide, Crestor on hold -Follow BMP in am   Acute kidney injury -Creatinine improved to 1.16 after diuresis -She received additional dose of Lasix 40 mg IV yesterday -We will recheck BMP in a.m.  Diabetes mellitus type 2 -Continue sliding scale insulin with NovoLog -CBG well controlled  Chronic anemia -Hemoglobin stable at 10.3  Anxiety -We will start home dose of Xanax 0.5 mg p.o. twice daily as needed    Medications     amLODipine  5 mg Oral Daily   Chlorhexidine Gluconate Cloth  6 each Topical Daily    heparin  5,000 Units Subcutaneous Q8H   insulin aspart  0-6 Units Subcutaneous Q4H   mouth rinse  15 mL Mouth Rinse BID   metoprolol succinate  100 mg Oral Daily     Data Reviewed:   CBG:  Recent Labs  Lab 07/15/21 2335 07/16/21 0316 07/16/21 0738 07/16/21 1124 07/16/21 1508  GLUCAP 106* 154* 99 142* 136*    SpO2: 94 % O2 Flow Rate (L/min): 2 L/min FiO2 (%): 40 %    Vitals:   07/16/21 1400 07/16/21 1500 07/16/21 1600 07/16/21 1700  BP:   (!) 147/87 (!) 151/71  Pulse: 88 84 90 86  Resp: 20 20 (!) 23 20  Temp:   98.1 F (36.7 C)   TempSrc:   Oral   SpO2: 95% 96% 95% 94%  Weight:      Height:         Intake/Output Summary (Last 24 hours) at 07/16/2021 1836 Last data filed at 07/16/2021 1149 Gross per 24 hour  Intake 120 ml  Output 2400 ml  Net -2280 ml    11/21 1901 - 11/23 0700 In: 390 [P.O.:390] Out: 4850 [Urine:4850]  Filed Weights   07/14/21 0602 07/15/21 0600 07/16/21 0600  Weight: 100.2 kg 112.6 kg 111.2 kg    Data Reviewed: Basic Metabolic Panel: Recent Labs  Lab 07/14/21 0548 07/14/21 0601 07/14/21 0608 07/14/21 0615 07/14/21 1518 07/14/21 1547 07/15/21 0056 07/16/21 0612  NA 133*   < > 133* 133* 134* 133* 135 138  K 5.5*   < > 5.9* 5.8* 5.7* 6.1* 5.6* 4.5  CL 99  --  101  --   --  99 103 103  CO2 26  --   --   --   --  23 26 28   GLUCOSE 249*  --  249*  --   --  217* 164* 104*  BUN 50*  --  70*  --   --  55* 51* 37*  CREATININE 1.38*  --  1.40*  --   --  1.39* 1.26* 1.16*  CALCIUM 9.5  --   --   --   --  9.7 9.8 9.9  MG  --   --   --   --   --   --  3.0*  --   PHOS  --   --   --   --   --   --  4.5  --    < > = values in this interval not displayed.   Liver Function Tests: Recent Labs  Lab 07/14/21 0548  AST 60*  ALT 55*  ALKPHOS 66  BILITOT 0.4  PROT 7.2  ALBUMIN 3.3*   No results for input(s): LIPASE, AMYLASE in the last 168 hours. No results for input(s): AMMONIA in the last 168 hours. CBC: Recent Labs  Lab  07/14/21 0548 07/14/21 0601 07/14/21 0608 07/14/21 0615 07/14/21 1518 07/15/21 0056 07/16/21 0612  WBC 9.0  --   --   --   --  7.3 10.5  NEUTROABS 6.8  --   --   --   --   --   --   HGB 9.5*   < > 10.9* 10.5* 10.5* 9.1* 9.3*  HCT 31.2*   < > 32.0* 31.0* 31.0* 29.9* 30.1*  MCV 98.4  --   --   --   --  95.5 93.8  PLT 138*  --   --   --   --  130* 138*   < > = values in this interval not displayed.   Cardiac Enzymes: No results for input(s): CKTOTAL, CKMB, CKMBINDEX, TROPONINI in the last 168 hours. BNP (last 3 results) Recent Labs    07/14/21 0548 07/15/21 0056  BNP 603.3* 691.8*    ProBNP (last 3 results) No results for input(s): PROBNP in the last 8760 hours.  CBG: Recent Labs  Lab 07/15/21 2335 07/16/21 0316 07/16/21 0738 07/16/21 1124 07/16/21 1508  GLUCAP 106* 154* 99 142* 136*       Radiology Reports  DG Chest Port 1 View  Result Date: 07/15/2021 CLINICAL DATA:  Respiratory failure. EXAM: PORTABLE CHEST 1 VIEW COMPARISON:  07/14/2021. FINDINGS: Prior CABG. Cardiomegaly again noted. Bilateral pulmonary infiltrates/edema again noted. Slight improvement from prior exam. Small bilateral pleural effusions can not be excluded. No pneumothorax. IMPRESSION: Prior CABG. Cardiomegaly again noted. Bilateral pulmonary infiltrates/edema again noted. Slight improvement from prior exam. Small bilateral pleural effusions cannot be excluded. Electronically Signed   By: Marcello Moores  Register M.D.   On: 07/15/2021 06:21       Antibiotics: Anti-infectives (From admission, onward)    Start     Dose/Rate Route Frequency Ordered Stop   07/15/21 1045  azithromycin (ZITHROMAX) tablet 500 mg  Status:  Discontinued        500 mg Oral Daily 07/15/21 0957 07/15/21 1018   07/14/21 0900  cefTRIAXone (ROCEPHIN) 2 g in sodium chloride 0.9 % 100 mL IVPB  Status:  Discontinued        2 g 200 mL/hr over 30 Minutes Intravenous Every 24 hours 07/14/21 0852 07/15/21 1018   07/14/21 0900   azithromycin (ZITHROMAX) 500 mg in  sodium chloride 0.9 % 250 mL IVPB  Status:  Discontinued        500 mg 250 mL/hr over 60 Minutes Intravenous Every 24 hours 07/14/21 0852 07/15/21 0957         DVT prophylaxis: Heparin  Code Status: Full code  Family Communication: Discussed with patient's son at bedside   Consultants:   Procedures:     Objective    Physical Examination:   General-appears in no acute distress Heart-S1-S2, regular, no murmur auscultated Lungs-clear to auscultation bilaterally, no wheezing or crackles auscultated Abdomen-soft, nontender, no organomegaly Extremities-no edema in the lower extremities Neuro-alert, oriented x3, no focal deficit noted   Status is: Inpatient  Dispo: The patient is from: Home              Anticipated d/c is to: Home              Anticipated d/c date is: 07/18/2021              Patient currently not stable for discharge  Barrier to discharge-none  COVID-19 Labs  No results for input(s): DDIMER, FERRITIN, LDH, CRP in the last 72 hours.  Lab Results  Component Value Date   [place] NEGATIVE 07/14/2021            Recent Results (from the past 240 hour(s))  Resp Panel by RT-PCR (Flu A&B, Covid) Nasopharyngeal Swab     Status: None   Collection Time: 07/14/21  6:13 AM   Specimen: Nasopharyngeal Swab; Nasopharyngeal(NP) swabs in vial transport medium  Result Value Ref Range Status   SARS Coronavirus 2 by RT PCR NEGATIVE NEGATIVE Final    Comment: (NOTE) SARS-CoV-2 target nucleic acids are NOT DETECTED.  The SARS-CoV-2 RNA is generally detectable in upper respiratory specimens during the acute phase of infection. The lowest concentration of SARS-CoV-2 viral copies this assay can detect is 138 copies/mL. A negative result does not preclude SARS-Cov-2 infection and should not be used as the sole basis for treatment or other patient management decisions. A negative result may occur with  improper specimen  collection/handling, submission of specimen other than nasopharyngeal swab, presence of viral mutation(s) within the areas targeted by this assay, and inadequate number of viral copies(<138 copies/mL). A negative result must be combined with clinical observations, patient history, and epidemiological information. The expected result is Negative.  Fact Sheet for Patients:  EntrepreneurPulse.com.au  Fact Sheet for Healthcare Providers:  IncredibleEmployment.be  This test is no t yet approved or cleared by the [place] FDA and  has been authorized for detection and/or diagnosis of SARS-CoV-2 by FDA under an Emergency Use Authorization (EUA). This EUA will remain  in effect (meaning this test can be used) for the duration of the COVID-19 declaration under Section 564(b)(1) of the Act, 21 U.S.C.section 360bbb-3(b)(1), unless the authorization is terminated  or revoked sooner.       Influenza A by PCR NEGATIVE NEGATIVE Final   Influenza B by PCR NEGATIVE NEGATIVE Final    Comment: (NOTE) The Xpert Xpress SARS-CoV-2/FLU/RSV plus assay is intended as an aid in the diagnosis of influenza from Nasopharyngeal swab specimens and should not be used as a sole basis for treatment. Nasal washings and aspirates are unacceptable for Xpert Xpress SARS-CoV-2/FLU/RSV testing.  Fact Sheet for Patients: EntrepreneurPulse.com.au  Fact Sheet for Healthcare Providers: IncredibleEmployment.be  This test is not yet approved or cleared by the [place] FDA and has been authorized for detection and/or diagnosis of SARS-CoV-2 by FDA under  an Emergency Use Authorization (EUA). This EUA will remain in effect (meaning this test can be used) for the duration of the COVID-19 declaration under Section 564(b)(1) of the Act, 21 U.S.C. section 360bbb-3(b)(1), unless the authorization is terminated or revoked.  Performed at [facility], [address] [place], [place] [address]   Respiratory (~20 pathogens) panel by PCR     Status: None   Collection Time: 07/14/21  6:13 AM   Specimen: Nasopharyngeal Swab; Respiratory  Result Value Ref Range Status   Adenovirus NOT DETECTED NOT DETECTED Final   Coronavirus 229E NOT DETECTED NOT DETECTED Final    Comment: (NOTE) The Coronavirus on the Respiratory Panel, DOES NOT test for the novel  Coronavirus (2019 nCoV)    Coronavirus HKU1 NOT DETECTED NOT DETECTED Final   Coronavirus NL63 NOT DETECTED NOT DETECTED Final   Coronavirus OC43 NOT DETECTED NOT DETECTED Final   Metapneumovirus NOT DETECTED NOT DETECTED Final   Rhinovirus / Enterovirus NOT DETECTED NOT DETECTED Final   Influenza A NOT DETECTED NOT DETECTED Final   Influenza B NOT DETECTED NOT DETECTED Final   Parainfluenza Virus 1 NOT DETECTED NOT DETECTED Final   Parainfluenza Virus 2 NOT DETECTED NOT DETECTED Final   Parainfluenza Virus 3 NOT DETECTED NOT DETECTED Final   Parainfluenza Virus 4 NOT DETECTED NOT DETECTED Final   Respiratory Syncytial Virus NOT DETECTED NOT DETECTED Final   Bordetella pertussis NOT DETECTED NOT DETECTED Final   Bordetella Parapertussis NOT DETECTED NOT DETECTED Final   Chlamydophila pneumoniae NOT DETECTED NOT DETECTED Final   Mycoplasma pneumoniae NOT DETECTED NOT DETECTED Final    Comment: Performed at [facility], [address], [place], [place] [address]  MRSA Next Gen by PCR, Nasal     Status: None   Collection Time: 07/14/21  9:41 AM   Specimen: Nasal Mucosa; Nasal Swab  Result Value Ref Range Status   MRSA by PCR Next Gen NOT DETECTED NOT DETECTED Final    Comment: (NOTE) The GeneXpert MRSA Assay (FDA approved for NASAL specimens only), is one component of a comprehensive MRSA colonization surveillance program. It is not intended to diagnose MRSA infection nor to guide or monitor treatment for MRSA infections. Test performance is not FDA approved in  patients less than 36 years old. Performed at [facility], [address] [place], [place] [address]     Meredeth Ide   [facility] If 7PM-7AM, please contact night-coverage at [email or web address], Office  [phone]   07/16/2021, 6:36 PM  LOS: 2 days

## 2021-07-17 ENCOUNTER — Inpatient Hospital Stay (HOSPITAL_COMMUNITY): Payer: Medicare PPO

## 2021-07-17 DIAGNOSIS — M79604 Pain in right leg: Secondary | ICD-10-CM

## 2021-07-17 DIAGNOSIS — M79605 Pain in left leg: Secondary | ICD-10-CM

## 2021-07-17 DIAGNOSIS — M7989 Other specified soft tissue disorders: Secondary | ICD-10-CM

## 2021-07-17 DIAGNOSIS — R0602 Shortness of breath: Secondary | ICD-10-CM

## 2021-07-17 LAB — GLUCOSE, CAPILLARY
Glucose-Capillary: 104 mg/dL — ABNORMAL HIGH (ref 70–99)
Glucose-Capillary: 105 mg/dL — ABNORMAL HIGH (ref 70–99)
Glucose-Capillary: 109 mg/dL — ABNORMAL HIGH (ref 70–99)
Glucose-Capillary: 119 mg/dL — ABNORMAL HIGH (ref 70–99)
Glucose-Capillary: 133 mg/dL — ABNORMAL HIGH (ref 70–99)
Glucose-Capillary: 137 mg/dL — ABNORMAL HIGH (ref 70–99)
Glucose-Capillary: 172 mg/dL — ABNORMAL HIGH (ref 70–99)

## 2021-07-17 LAB — CBC
HCT: 31.4 % — ABNORMAL LOW (ref 36.0–46.0)
Hemoglobin: 9.6 g/dL — ABNORMAL LOW (ref 12.0–15.0)
MCH: 29.1 pg (ref 26.0–34.0)
MCHC: 30.6 g/dL (ref 30.0–36.0)
MCV: 95.2 fL (ref 80.0–100.0)
Platelets: 130 10*3/uL — ABNORMAL LOW (ref 150–400)
RBC: 3.3 MIL/uL — ABNORMAL LOW (ref 3.87–5.11)
RDW: 14.9 % (ref 11.5–15.5)
WBC: 8.2 10*3/uL (ref 4.0–10.5)
nRBC: 0.2 % (ref 0.0–0.2)

## 2021-07-17 LAB — BASIC METABOLIC PANEL
Anion gap: 7 (ref 5–15)
BUN: 29 mg/dL — ABNORMAL HIGH (ref 8–23)
CO2: 27 mmol/L (ref 22–32)
Calcium: 9.7 mg/dL (ref 8.9–10.3)
Chloride: 105 mmol/L (ref 98–111)
Creatinine, Ser: 1.13 mg/dL — ABNORMAL HIGH (ref 0.44–1.00)
GFR, Estimated: 48 mL/min — ABNORMAL LOW (ref >60)
Glucose, Bld: 117 mg/dL — ABNORMAL HIGH (ref 70–99)
Potassium: 4.5 mmol/L (ref 3.5–5.1)
Sodium: 139 mmol/L (ref 135–145)

## 2021-07-17 NOTE — Plan of Care (Signed)
  Problem: Clinical Measurements: Goal: Ability to maintain clinical measurements within normal limits will improve Outcome: Progressing   Problem: Health Behavior/Discharge Planning: Goal: Ability to manage health-related needs will improve Outcome: Progressing   Problem: Education: Goal: Knowledge of General Education information will improve Description: Including pain rating scale, medication(s)/side effects and non-pharmacologic comfort measures Outcome: Progressing   Problem: Clinical Measurements: Goal: Diagnostic test results will improve Outcome: Progressing   Problem: Clinical Measurements: Goal: Respiratory complications will improve Outcome: Progressing   Problem: Clinical Measurements: Goal: Cardiovascular complication will be avoided Outcome: Progressing   Problem: Activity: Goal: Risk for activity intolerance will decrease Outcome: Progressing   Problem: Nutrition: Goal: Adequate nutrition will be maintained Outcome: Progressing   Problem: Coping: Goal: Level of anxiety will decrease Outcome: Progressing   Problem: Elimination: Goal: Will not experience complications related to urinary retention Outcome: Progressing

## 2021-07-17 NOTE — Progress Notes (Signed)
BLE venous duplex has been completed.  Results can be found under chart review under CV PROC. 07/17/2021 6:16 PM Libbi Towner RVT, RDMS

## 2021-07-17 NOTE — Progress Notes (Signed)
[facility]  PROGRESS NOTE  Erika Gates [id number] DOB: [birth year] DOA: 07/14/2021 PCP: Alvester Chou, NP   Brief HPI:   86 year old female came to ED with concerns of altered mental status.  Patient was having low blood glucose levels over past several weeks so her home regimen of glimepiride was reduced to half as directed by her PCP.  Also she had developed worsening shortness of breath, increased lower extremity edema.  In the ED she was placed on BiPAP.  Chest x-ray showed bilateral pleural effusions, vascular congestion with interstitial edema with alveolar infiltrates.  COVID and influenza tested negative.  [facility] resumed care on 07/16/2021  Subjective   Patient seen and examined, breathing has improved.  Still requiring 2 L/min of oxygen.   Assessment/Plan:     Acute hypercapnic respiratory failure -Patient was started on BiPAP which has been weaned off -Currently requiring oxygen via nasal cannula 2 L/min; continue BiPAP as needed at night -She was empirically started on antibiotics for community-acquired pneumonia -Antibiotics stopped as she was afebrile, procalcitonin less than 0.10 -Consider sleep study as outpatient -Wean off oxygen as tolerated, will also do ambulatory O2 sats to see if she requires oxygen at home.  Acute on chronic diastolic CHF -History of CAD s/p three-vessel CABG -Echo from 11/21 showed left ventricle ejection fraction of 60-65 percent -Grade 1 diastolic dysfunction -Diuresed well with IV Lasix, net -6.0 liters -Continue Norvasc, Lopressor -Lisinopril, Maxide, Crestor on hold -Follow BMP in am   Acute kidney injury -Creatinine improved to 1.13 after diuresis -She is not on IV Lasix at this time  Diabetes mellitus type 2 -Continue sliding scale insulin with NovoLog -CBG well controlled  Chronic anemia -Hemoglobin stable at 10.3  Anxiety -Continue home dose of Xanax 0.5 mg p.o. twice daily as needed    Medications      amLODipine  5 mg Oral Daily   Chlorhexidine Gluconate Cloth  6 each Topical Daily   fluticasone  2 spray Each Nare Daily   heparin  5,000 Units Subcutaneous Q8H   insulin aspart  0-6 Units Subcutaneous Q4H   mouth rinse  15 mL Mouth Rinse BID   metoprolol succinate  100 mg Oral Daily     Data Reviewed:   CBG:  Recent Labs  Lab 07/16/21 1914 07/17/21 0007 07/17/21 0423 07/17/21 0755 07/17/21 1148  GLUCAP 161* 105* 104* 119* 109*    SpO2: 94 % O2 Flow Rate (L/min): 2 L/min FiO2 (%): (!) 2 %    Vitals:   07/17/21 0427 07/17/21 0933 07/17/21 0937 07/17/21 1237  BP:  139/83  (!) 155/68  Pulse:   95 93  Resp:    18  Temp:    98.6 F (37 C)  TempSrc:    Oral  SpO2:    94%  Weight: 110.2 kg     Height:         Intake/Output Summary (Last 24 hours) at 07/17/2021 1628 Last data filed at 07/17/2021 1506 Gross per 24 hour  Intake 240 ml  Output 1300 ml  Net -1060 ml    11/22 1901 - 11/24 0700 In: 360 [P.O.:360] Out: 3350 [Urine:3350]  Filed Weights   07/16/21 0600 07/17/21 0009 07/17/21 0427  Weight: 111.2 kg 110.2 kg 110.2 kg    Data Reviewed: Basic Metabolic Panel: Recent Labs  Lab 07/14/21 0548 07/14/21 0601 07/14/21 [id number] 07/14/21 0615 07/14/21 1518 07/14/21 1547 07/15/21 0056 07/16/21 0612 07/17/21 0134  NA 133*   < > 133*   < >  134* 133* 135 138 139  K 5.5*   < > 5.9*   < > 5.7* 6.1* 5.6* 4.5 4.5  CL 99  --  101  --   --  99 103 103 105  CO2 26  --   --   --   --  23 26 28 27   GLUCOSE 249*  --  249*  --   --  217* 164* 104* 117*  BUN 50*  --  70*  --   --  55* 51* 37* 29*  CREATININE 1.38*  --  1.40*  --   --  1.39* 1.26* 1.16* 1.13*  CALCIUM 9.5  --   --   --   --  9.7 9.8 9.9 9.7  MG  --   --   --   --   --   --  3.0*  --   --   PHOS  --   --   --   --   --   --  4.5  --   --    < > = values in this interval not displayed.   Liver Function Tests: Recent Labs  Lab 07/14/21 0548  AST 60*  ALT 55*  ALKPHOS 66  BILITOT 0.4  PROT 7.2   ALBUMIN 3.3*   No results for input(s): LIPASE, AMYLASE in the last 168 hours. No results for input(s): AMMONIA in the last 168 hours. CBC: Recent Labs  Lab 07/14/21 0548 07/14/21 0601 07/14/21 0615 07/14/21 1518 07/15/21 0056 07/16/21 0612 07/17/21 0134  WBC 9.0  --   --   --  7.3 10.5 8.2  NEUTROABS 6.8  --   --   --   --   --   --   HGB 9.5*   < > 10.5* 10.5* 9.1* 9.3* 9.6*  HCT 31.2*   < > 31.0* 31.0* 29.9* 30.1* 31.4*  MCV 98.4  --   --   --  95.5 93.8 95.2  PLT 138*  --   --   --  130* 138* 130*   < > = values in this interval not displayed.   Cardiac Enzymes: No results for input(s): CKTOTAL, CKMB, CKMBINDEX, TROPONINI in the last 168 hours. BNP (last 3 results) Recent Labs    07/14/21 0548 07/15/21 0056  BNP 603.3* 691.8*    ProBNP (last 3 results) No results for input(s): PROBNP in the last 8760 hours.  CBG: Recent Labs  Lab 07/16/21 1914 07/17/21 0007 07/17/21 0423 07/17/21 0755 07/17/21 1148  GLUCAP 161* 105* 104* 119* 109*       Radiology Reports  No results found.     Antibiotics: Anti-infectives (From admission, onward)    Start     Dose/Rate Route Frequency Ordered Stop   07/15/21 1045  azithromycin (ZITHROMAX) tablet 500 mg  Status:  Discontinued        500 mg Oral Daily 07/15/21 0957 07/15/21 1018   07/14/21 0900  cefTRIAXone (ROCEPHIN) 2 g in sodium chloride 0.9 % 100 mL IVPB  Status:  Discontinued        2 g 200 mL/hr over 30 Minutes Intravenous Every 24 hours 07/14/21 0852 07/15/21 1018   07/14/21 0900  azithromycin (ZITHROMAX) 500 mg in sodium chloride 0.9 % 250 mL IVPB  Status:  Discontinued        500 mg 250 mL/hr over 60 Minutes Intravenous Every 24 hours 07/14/21 0852 07/15/21 0957         DVT prophylaxis: Heparin  Code Status: Full code  Family Communication: Discussed with patient's son at bedside   Consultants:   Procedures:     Objective    Physical Examination:   General-appears in no acute  distress Heart-S1-S2, regular, no murmur auscultated Lungs-clear to auscultation bilaterally, no wheezing or crackles auscultated Abdomen-soft, nontender, no organomegaly Extremities-no edema in the lower extremities Neuro-alert, oriented x3, no focal deficit noted   Status is: Inpatient  Dispo: The patient is from: Home              Anticipated d/c is to: Home              Anticipated d/c date is: 07/18/2021              Patient currently not stable for discharge  Barrier to discharge-none  COVID-19 Labs  No results for input(s): DDIMER, FERRITIN, LDH, CRP in the last 72 hours.  Lab Results  Component Value Date   [place] NEGATIVE 07/14/2021            Recent Results (from the past 240 hour(s))  Resp Panel by RT-PCR (Flu A&B, Covid) Nasopharyngeal Swab     Status: None   Collection Time: 07/14/21  6:13 AM   Specimen: Nasopharyngeal Swab; Nasopharyngeal(NP) swabs in vial transport medium  Result Value Ref Range Status   SARS Coronavirus 2 by RT PCR NEGATIVE NEGATIVE Final    Comment: (NOTE) SARS-CoV-2 target nucleic acids are NOT DETECTED.  The SARS-CoV-2 RNA is generally detectable in upper respiratory specimens during the acute phase of infection. The lowest concentration of SARS-CoV-2 viral copies this assay can detect is 138 copies/mL. A negative result does not preclude SARS-Cov-2 infection and should not be used as the sole basis for treatment or other patient management decisions. A negative result may occur with  improper specimen collection/handling, submission of specimen other than nasopharyngeal swab, presence of viral mutation(s) within the areas targeted by this assay, and inadequate number of viral copies(<138 copies/mL). A negative result must be combined with clinical observations, patient history, and epidemiological information. The expected result is Negative.  Fact Sheet for Patients:  EntrepreneurPulse.com.au  Fact  Sheet for Healthcare Providers:  IncredibleEmployment.be  This test is no t yet approved or cleared by the [place] FDA and  has been authorized for detection and/or diagnosis of SARS-CoV-2 by FDA under an Emergency Use Authorization (EUA). This EUA will remain  in effect (meaning this test can be used) for the duration of the COVID-19 declaration under Section 564(b)(1) of the Act, 21 U.S.C.section 360bbb-3(b)(1), unless the authorization is terminated  or revoked sooner.       Influenza A by PCR NEGATIVE NEGATIVE Final   Influenza B by PCR NEGATIVE NEGATIVE Final    Comment: (NOTE) The Xpert Xpress SARS-CoV-2/FLU/RSV plus assay is intended as an aid in the diagnosis of influenza from Nasopharyngeal swab specimens and should not be used as a sole basis for treatment. Nasal washings and aspirates are unacceptable for Xpert Xpress SARS-CoV-2/FLU/RSV testing.  Fact Sheet for Patients: EntrepreneurPulse.com.au  Fact Sheet for Healthcare Providers: IncredibleEmployment.be  This test is not yet approved or cleared by the [place] FDA and has been authorized for detection and/or diagnosis of SARS-CoV-2 by FDA under an Emergency Use Authorization (EUA). This EUA will remain in effect (meaning this test can be used) for the duration of the COVID-19 declaration under Section 564(b)(1) of the Act, 21 U.S.C. section 360bbb-3(b)(1), unless the authorization is terminated or revoked.  Performed at  [facility], [address] [place], [place] [address]   Respiratory (~20 pathogens) panel by PCR     Status: None   Collection Time: 07/14/21  6:13 AM   Specimen: Nasopharyngeal Swab; Respiratory  Result Value Ref Range Status   Adenovirus NOT DETECTED NOT DETECTED Final   Coronavirus 229E NOT DETECTED NOT DETECTED Final    Comment: (NOTE) The Coronavirus on the Respiratory Panel, DOES NOT test for the novel   Coronavirus (2019 nCoV)    Coronavirus HKU1 NOT DETECTED NOT DETECTED Final   Coronavirus NL63 NOT DETECTED NOT DETECTED Final   Coronavirus OC43 NOT DETECTED NOT DETECTED Final   Metapneumovirus NOT DETECTED NOT DETECTED Final   Rhinovirus / Enterovirus NOT DETECTED NOT DETECTED Final   Influenza A NOT DETECTED NOT DETECTED Final   Influenza B NOT DETECTED NOT DETECTED Final   Parainfluenza Virus 1 NOT DETECTED NOT DETECTED Final   Parainfluenza Virus 2 NOT DETECTED NOT DETECTED Final   Parainfluenza Virus 3 NOT DETECTED NOT DETECTED Final   Parainfluenza Virus 4 NOT DETECTED NOT DETECTED Final   Respiratory Syncytial Virus NOT DETECTED NOT DETECTED Final   Bordetella pertussis NOT DETECTED NOT DETECTED Final   Bordetella Parapertussis NOT DETECTED NOT DETECTED Final   Chlamydophila pneumoniae NOT DETECTED NOT DETECTED Final   Mycoplasma pneumoniae NOT DETECTED NOT DETECTED Final    Comment: Performed at [facility], [address] [place], [place] [address]  MRSA Next Gen by PCR, Nasal     Status: None   Collection Time: 07/14/21  9:41 AM   Specimen: Nasal Mucosa; Nasal Swab  Result Value Ref Range Status   MRSA by PCR Next Gen NOT DETECTED NOT DETECTED Final    Comment: (NOTE) The GeneXpert MRSA Assay (FDA approved for NASAL specimens only), is one component of a comprehensive MRSA colonization surveillance program. It is not intended to diagnose MRSA infection nor to guide or monitor treatment for MRSA infections. Test performance is not FDA approved in patients less than 17 years old. Performed at [facility], [address] [place], [place] [address]     Meredeth Ide   [facility] If 7PM-7AM, please contact night-coverage at [email or web address], Office  [phone]   07/17/2021, 4:28 PM  LOS: 3 days

## 2021-07-17 NOTE — Progress Notes (Signed)
SATURATION QUALIFICATIONS: (This note is used to comply with regulatory documentation for home oxygen)  Patient Saturations on Room Air at Rest = 92%  Patient Saturations on Room Air while Ambulating = 86%  Patient Saturations on 3 Liters of oxygen while Ambulating = 93%  Please briefly explain why patient needs home oxygen: 

## 2021-07-18 LAB — BASIC METABOLIC PANEL
Anion gap: 8 (ref 5–15)
BUN: 27 mg/dL — ABNORMAL HIGH (ref 8–23)
CO2: 26 mmol/L (ref 22–32)
Calcium: 9.5 mg/dL (ref 8.9–10.3)
Chloride: 106 mmol/L (ref 98–111)
Creatinine, Ser: 1.13 mg/dL — ABNORMAL HIGH (ref 0.44–1.00)
GFR, Estimated: 48 mL/min — ABNORMAL LOW (ref >60)
Glucose, Bld: 125 mg/dL — ABNORMAL HIGH (ref 70–99)
Potassium: 4.4 mmol/L (ref 3.5–5.1)
Sodium: 140 mmol/L (ref 135–145)

## 2021-07-18 LAB — GLUCOSE, CAPILLARY
Glucose-Capillary: 117 mg/dL — ABNORMAL HIGH (ref 70–99)
Glucose-Capillary: 155 mg/dL — ABNORMAL HIGH (ref 70–99)
Glucose-Capillary: 132 mg/dL — ABNORMAL HIGH (ref 70–99)

## 2021-07-18 MED ORDER — LISINOPRIL 20 MG PO TABS: 20.0000 mg | ORAL_TABLET | Freq: Every day | ORAL | 11 refills | Status: DC

## 2021-07-18 MED ORDER — GABAPENTIN 100 MG PO CAPS
100.0000 mg | ORAL_CAPSULE | Freq: Once | ORAL | Status: AC
  Administered 2021-07-18: 100 mg via ORAL
  Filled 2021-07-18: qty 1

## 2021-07-18 MED ORDER — FUROSEMIDE 20 MG PO TABS: 20.0000 mg | ORAL_TABLET | Freq: Every day | ORAL | 11 refills | Status: DC

## 2021-07-18 NOTE — Plan of Care (Signed)
  Problem: Education: Goal: Knowledge of General Education information will improve Description: Including pain rating scale, medication(s)/side effects and non-pharmacologic comfort measures Outcome: Progressing   Problem: Health Behavior/Discharge Planning: Goal: Ability to manage health-related needs will improve Outcome: Progressing   Problem: Clinical Measurements: Goal: Ability to maintain clinical measurements within normal limits will improve Outcome: Progressing   Problem: Clinical Measurements: Goal: Respiratory complications will improve Outcome: Progressing   Problem: Clinical Measurements: Goal: Cardiovascular complication will be avoided Outcome: Progressing   Problem: Activity: Goal: Risk for activity intolerance will decrease Outcome: Progressing   Problem: Nutrition: Goal: Adequate nutrition will be maintained Outcome: Progressing   Problem: Coping: Goal: Level of anxiety will decrease Outcome: Progressing   Problem: Pain Managment: Goal: General experience of comfort will improve Outcome: Progressing   Problem: Safety: Goal: Ability to remain free from injury will improve Outcome: Progressing   Problem: Skin Integrity: Goal: Risk for impaired skin integrity will decrease Outcome: Progressing

## 2021-07-18 NOTE — Discharge Summary (Addendum)
Physician Discharge Summary  Erika Gates [id number] DOB: Jan 05, 1935 DOA: 07/14/2021  PCP: Alvester Chou, NP  Admit date: 07/14/2021 Discharge date: 07/18/2021  Time spent: 60 minutes  Recommendations for Outpatient Follow-up:  Follow-up PCP in 1 week Check BMP in 1 week Follow-up cardiology as outpatient   Discharge Diagnoses:  Principal Problem:   Acute respiratory failure with hypoxia and hypercarbia [facility])   Discharge Condition: Stable  Diet recommendation: Heart healthy diet  Filed Weights   07/17/21 0009 07/17/21 0427 07/18/21 0500  Weight: 110.2 kg 110.2 kg 110.5 kg    History of present illness:  86 year old female came to ED with concerns of altered mental status.  Patient was having low blood glucose levels over past several weeks so her home regimen of glimepiride was reduced to half as directed by her PCP.  Also she had developed worsening shortness of breath, increased lower extremity edema.  In the ED she was placed on BiPAP.  Chest x-ray showed bilateral pleural effusions, vascular congestion with interstitial edema with alveolar infiltrates.  COVID and influenza tested negative.  Hospital Course:  Acute hypercapnic respiratory failure -Resolved -Patient was started on BiPAP which has been weaned off -Currently requiring oxygen via nasal cannula 3 L/min on ambulation -She was empirically started on antibiotics for community-acquired pneumonia -Antibiotics stopped as she was afebrile, procalcitonin less than 0.10 -Consider sleep study as outpatient    Acute on chronic diastolic CHF -History of CAD s/p three-vessel CABG -Echo from 11/21 showed left ventricle ejection fraction of 60-65 percent -Grade 1 diastolic dysfunction -Diuresed well with IV Lasix, net -6.0 liters -Continue Norvasc, Lopressor -We will start Lasix 20 mg p.o. daily, dose of lisinopril also changed to 20 mg daily.  Will discontinue Maxide.  Continue Crestor -Follow-up cardiology as  outpatient, ambulatory referral has been made to follow-up with Dr. Tamala Julian.  Leg pain -Patient complained of leg pain, also had mild swelling of the left lower extremity -Venous duplex of lower extremities was negative for DVT bilaterally   Acute kidney injury -Creatinine improved to 1.13 after diuresis -Started on p.o. Lasix 20 mg daily -Follow BMP in 1 week and as outpatient   Diabetes mellitus type 2 -Continue home medications   Chronic anemia -Hemoglobin stable at 10.3   Anxiety -Continue Xanax as needed  Procedures: Echocardiogram Bilateral venous duplex of lower extremities-negative for DVT  Consultations: PCCM  Discharge Exam: Vitals:   07/17/21 2109 07/18/21 0314  BP: (!) 161/75 (!) 158/75  Pulse: 90 83  Resp: 16 15  Temp: 98.3 F (36.8 C) 98.5 F (36.9 C)  SpO2: 98% 98%    General: Appears in no acute distress Cardiovascular: S1-S2, regular, no murmur auscultated Respiratory: Clear to auscultation bilaterally  Discharge Instructions   Discharge Instructions     Diet - low sodium heart healthy   Complete by: As directed    Increase activity slowly   Complete by: As directed       Allergies as of 07/18/2021       Reactions   Metformin Other (See Comments)   REACTION: pt refuses METFORMIN "it made me sad"...        Medication List     STOP taking these medications    cloNIDine 0.1 MG tablet Commonly known as: CATAPRES   ibuprofen 200 MG tablet Commonly known as: ADVIL   triamterene-hydrochlorothiazide 37.5-25 MG tablet Commonly known as: MAXZIDE-25       TAKE these medications    acetaminophen 500 MG tablet Commonly known  as: TYLENOL Take 1,000 mg by mouth every 6 (six) hours as needed for mild pain.   ALPRAZolam 0.5 MG tablet Commonly known as: XANAX TAKE 1 TABLET BY MOUTH 3 TIMES A DAY AS NEEDED FOR NERVES What changed:  how much to take how to take this when to take this additional instructions   amLODipine 10 MG  tablet Commonly known as: NORVASC Take 10 mg by mouth daily.   aspirin EC 81 MG tablet Take 81 mg by mouth daily. Swallow whole.   cyclobenzaprine 10 MG tablet Commonly known as: FLEXERIL Take 10 mg by mouth 2 (two) times daily as needed for muscle spasms.   diclofenac sodium 1 % Gel Commonly known as: VOLTAREN APPLY 1 GM TO AFFECTED AREA UP TO 3 TIMES A DAY AS NEEDED   furosemide 20 MG tablet Commonly known as: Lasix Take 1 tablet (20 mg total) by mouth daily.   gabapentin 100 MG capsule Commonly known as: NEURONTIN Take 1 capsule (100 mg total) by mouth at bedtime. What changed: when to take this   gabapentin 300 MG capsule Commonly known as: NEURONTIN Take 300 mg by mouth at bedtime. What changed: Another medication with the same name was changed. Make sure you understand how and when to take each.   glimepiride 2 MG tablet Commonly known as: AMARYL Take 2 mg by mouth daily.   lisinopril 20 MG tablet Commonly known as: ZESTRIL Take 1 tablet (20 mg total) by mouth daily. What changed:  medication strength how much to take   metoprolol succinate 100 MG 24 hr tablet Commonly known as: TOPROL-XL Take 1 tablet (100 mg total) by mouth daily.   PROTEGRA CARDIO PO Take 1 capsule by mouth daily.   rosuvastatin 40 MG tablet Commonly known as: CRESTOR Take 40 mg by mouth daily.   Vitamin D 1000 units capsule Take 2,000 Units by mouth daily.   vitamin E 45 MG (100 UNITS) capsule Take 100 Units by mouth daily.               Durable Medical Equipment  (From admission, onward)           Start     Ordered   07/18/21 0857  For home use only DME 3 n 1  Once        07/18/21 0856   07/18/21 0849  DME Oxygen  Once       Question Answer Comment  Length of Need Lifetime   Mode or (Route) Nasal cannula   Liters per Minute 3   Frequency Continuous (stationary and portable oxygen unit needed)   Oxygen conserving device Yes   Oxygen delivery system Gas       07/18/21 0854   07/17/21 1134  For home use only DME 4 wheeled rolling walker with seat  Once       Question:  Patient needs a walker to treat with the following condition  Answer:  CHF (congestive heart failure) ([place])   07/17/21 1134           Allergies  Allergen Reactions   Metformin Other (See Comments)    REACTION: pt refuses METFORMIN "it made me sad"...    Follow-up Information     Alvester Chou, NP Follow up in 1 week(s).   Specialty: Nurse Practitioner Contact information: [facility] [address] [place] [address] [phone]  The results of significant diagnostics from this hospitalization (including imaging, microbiology, ancillary and laboratory) are listed below for reference.    Significant Diagnostic Studies: DG Chest Port 1 View  Result Date: 07/15/2021 CLINICAL DATA:  Respiratory failure. EXAM: PORTABLE CHEST 1 VIEW COMPARISON:  07/14/2021. FINDINGS: Prior CABG. Cardiomegaly again noted. Bilateral pulmonary infiltrates/edema again noted. Slight improvement from prior exam. Small bilateral pleural effusions can not be excluded. No pneumothorax. IMPRESSION: Prior CABG. Cardiomegaly again noted. Bilateral pulmonary infiltrates/edema again noted. Slight improvement from prior exam. Small bilateral pleural effusions cannot be excluded. Electronically Signed   By: Marcello Moores  Register M.D.   On: 07/15/2021 06:21   DG Chest Port 1 View  Result Date: 07/14/2021 CLINICAL DATA:  Shortness of breath. EXAM: PORTABLE CHEST 1 VIEW COMPARISON:  PA Lat 10/01/2011. FINDINGS: Old sternotomy and CABG change is again noted. The heart is enlarged. There is worsening perihilar vascular congestion and moderate interstitial edema. Alveolar infiltrates radiate into the mid and lower lung fields consistent with alveolar edema with underlying pneumonia difficult to exclude in the proper clinical setting. There are moderate  right-greater-than-left pleural effusions, with overlying consolidation or atelectasis in the lower lung fields. IMPRESSION: Findings of CHF or fluid overload with pulmonary edema and moderate right-greater-than-left pleural effusions. Underlying pneumonia would be difficult to exclude in the proper clinical setting. Clinical correlation and radiographic follow-up recommended. Electronically Signed   By: Telford Nab M.D.   On: 07/14/2021 05:47   ECHOCARDIOGRAM COMPLETE  Result Date: 07/14/2021    ECHOCARDIOGRAM REPORT   Patient Name:   Erika Gates Date of Exam: 07/14/2021 Medical Rec #:  [id number]        Height:       60.0 in Accession #:    [id number]       Weight:       220.9 lb Date of Birth:  25-Feb-1935        BSA:          1.947 m Patient Age:    13 years         BP:           113/53 mmHg Patient Gender: F                HR:           53 bpm. Exam Location:  Inpatient Procedure: 2D Echo, Color Doppler, Cardiac Doppler and Intracardiac            Opacification Agent Indications:    Respiratory distress  History:        Patient has no prior history of Echocardiogram examinations.                 Risk Factors:Hypertension and Diabetes.  Sonographer:    Jyl Heinz Referring Phys: [id number] [place]  1. Left ventricular ejection fraction, by estimation, is 60 to 65%. The left ventricle has normal function. The left ventricle has no regional wall motion abnormalities. There is mild left ventricular hypertrophy. Left ventricular diastolic parameters are consistent with Grade I diastolic dysfunction (impaired relaxation). Elevated left ventricular end-diastolic pressure. The E/e' is 7.  2. Right ventricular systolic function is mildly reduced. The right ventricular size is normal. There is mildly elevated pulmonary artery systolic pressure. The estimated right ventricular systolic pressure is 123456 mmHg.  3. The mitral valve is grossly normal. Trivial mitral valve regurgitation.  4. The  aortic valve is tricuspid. Aortic valve regurgitation is not visualized.  5. The inferior  vena cava is dilated in size with <50% respiratory variability, suggesting right atrial pressure of 15 mmHg. Comparison(s): No prior Echocardiogram. FINDINGS  Left Ventricle: Left ventricular ejection fraction, by estimation, is 60 to 65%. The left ventricle has normal function. The left ventricle has no regional wall motion abnormalities. The left ventricular internal cavity size was normal in size. There is  mild left ventricular hypertrophy. Left ventricular diastolic parameters are consistent with Grade I diastolic dysfunction (impaired relaxation). Elevated left ventricular end-diastolic pressure. The E/e' is 85. Right Ventricle: The right ventricular size is normal. No increase in right ventricular wall thickness. Right ventricular systolic function is mildly reduced. There is mildly elevated pulmonary artery systolic pressure. The tricuspid regurgitant velocity  is 2.34 m/s, and with an assumed right atrial pressure of 15 mmHg, the estimated right ventricular systolic pressure is 123456 mmHg. Left Atrium: Left atrial size was normal in size. Right Atrium: Right atrial size was normal in size. Pericardium: There is no evidence of pericardial effusion. Mitral Valve: The mitral valve is grossly normal. Trivial mitral valve regurgitation. Tricuspid Valve: The tricuspid valve is grossly normal. Tricuspid valve regurgitation is trivial. Aortic Valve: The aortic valve is tricuspid. Aortic valve regurgitation is not visualized. Aortic valve peak gradient measures 10.1 mmHg. Pulmonic Valve: The pulmonic valve was grossly normal. Pulmonic valve regurgitation is trivial. Aorta: The aortic root and ascending aorta are structurally normal, with no evidence of dilitation. Venous: The inferior vena cava is dilated in size with less than 50% respiratory variability, suggesting right atrial pressure of 15 mmHg. IAS/Shunts: No atrial level  shunt detected by color flow Doppler.  LEFT VENTRICLE PLAX 2D LVIDd:         5.00 cm      Diastology LVIDs:         2.70 cm      LV e' medial:    3.92 cm/s LV PW:         1.20 cm      LV E/e' medial:  30.1 LV IVS:        1.20 cm      LV e' lateral:   4.24 cm/s LVOT diam:     2.00 cm      LV E/e' lateral: 27.8 LV SV:         113 LV SV Index:   58 LVOT Area:     3.14 cm  LV Volumes (MOD) LV vol d, MOD A2C: 121.0 ml LV vol d, MOD A4C: 102.0 ml LV vol s, MOD A2C: 54.1 ml LV vol s, MOD A4C: 42.3 ml LV SV MOD A2C:     66.9 ml LV SV MOD A4C:     102.0 ml LV SV MOD BP:      63.5 ml RIGHT VENTRICLE            IVC RV Basal diam:  3.70 cm    IVC diam: 2.20 cm RV Mid diam:    2.80 cm RV S prime:     7.94 cm/s TAPSE (M-mode): 1.8 cm LEFT ATRIUM             Index        RIGHT ATRIUM           Index LA diam:        3.60 cm 1.85 cm/m   RA Area:     12.50 cm LA Vol (A2C):   67.8 ml 34.82 ml/m  RA Volume:   26.60 ml  13.66 ml/m LA  Vol (A4C):   49.8 ml 25.58 ml/m LA Biplane Vol: 60.9 ml 31.28 ml/m  AORTIC VALVE AV Area (Vmax): 2.61 cm AV Vmax:        159.00 cm/s AV Peak Grad:   10.1 mmHg LVOT Vmax:      132.00 cm/s LVOT Vmean:     82.900 cm/s LVOT VTI:       0.361 m  AORTA Ao Root diam: 3.00 cm Ao Asc diam:  2.70 cm MITRAL VALVE                TRICUSPID VALVE MV Area (PHT): 3.46 cm     TR Peak grad:   21.9 mmHg MV Decel Time: 219 msec     TR Vmax:        234.00 cm/s MV E velocity: 118.00 cm/s MV A velocity: 90.60 cm/s   SHUNTS MV E/A ratio:  1.30         Systemic VTI:  0.36 m                             Systemic Diam: 2.00 cm Zoila Shutter MD Electronically signed by Zoila Shutter MD Signature Date/Time: 07/14/2021/4:50:32 PM    Final    VAS [place] LOWER EXTREMITY VENOUS (DVT)  Result Date: 07/17/2021  Lower Venous DVT Study Patient Name:  Erika Gates  Date of Exam:   07/17/2021 Medical Rec #: [id number]         Accession #:    [id number] Date of Birth: 1934-12-30         Patient Gender: F Patient Age:   46 years Exam  Location:  [facility] Procedure:      VAS [place] LOWER EXTREMITY VENOUS (DVT) Referring Phys: Sibyl Parr [place] Miu --------------------------------------------------------------------------------  Indications: Pain, and Swelling.  Limitations: Body habitus and poor ultrasound/tissue interface. Comparison Study: No previous exams Performing Technologist: Jody Hill RVT, RDMS  Examination Guidelines: A complete evaluation includes B-mode imaging, spectral Doppler, color Doppler, and power Doppler as needed of all accessible portions of each vessel. Bilateral testing is considered an integral part of a complete examination. Limited examinations for reoccurring indications may be performed as noted. The reflux portion of the exam is performed with the patient in reverse Trendelenburg.  +---------+---------------+---------+-----------+----------+-------------------+ RIGHT    CompressibilityPhasicitySpontaneityPropertiesThrombus Aging      +---------+---------------+---------+-----------+----------+-------------------+ CFV      Full           Yes      Yes                                      +---------+---------------+---------+-----------+----------+-------------------+ SFJ      Full                                                             +---------+---------------+---------+-----------+----------+-------------------+ FV Prox  Full           Yes      Yes                                      +---------+---------------+---------+-----------+----------+-------------------+ FV Mid  Full           Yes      Yes                                      +---------+---------------+---------+-----------+----------+-------------------+ FV DistalFull           Yes      Yes                                      +---------+---------------+---------+-----------+----------+-------------------+ PFV      Full                                                              +---------+---------------+---------+-----------+----------+-------------------+ POP      Full           Yes      Yes                                      +---------+---------------+---------+-----------+----------+-------------------+ PTV      Full                                         Not well visualized +---------+---------------+---------+-----------+----------+-------------------+ PERO                                                  Not visualized on                                                         this exam           +---------+---------------+---------+-----------+----------+-------------------+   Right Technical Findings: Not visualized segments include peroneal veins.  +---------+---------------+---------+-----------+----------+-------------------+ LEFT     CompressibilityPhasicitySpontaneityPropertiesThrombus Aging      +---------+---------------+---------+-----------+----------+-------------------+ CFV      Full           Yes      Yes                                      +---------+---------------+---------+-----------+----------+-------------------+ SFJ      Full                                                             +---------+---------------+---------+-----------+----------+-------------------+ FV Prox  Full           Yes      Yes                                      +---------+---------------+---------+-----------+----------+-------------------+  FV Mid   Full           Yes      Yes                                      +---------+---------------+---------+-----------+----------+-------------------+ FV DistalFull           Yes      Yes                                      +---------+---------------+---------+-----------+----------+-------------------+ PFV      Full                                                             +---------+---------------+---------+-----------+----------+-------------------+ POP       Full           Yes      Yes                                      +---------+---------------+---------+-----------+----------+-------------------+ PTV      Full                                         Not well visualized +---------+---------------+---------+-----------+----------+-------------------+ PERO     Full                                         Not well visualized +---------+---------------+---------+-----------+----------+-------------------+    Summary: BILATERAL: - No evidence of deep vein thrombosis seen in the lower extremities, bilaterally. - No evidence of superficial venous thrombosis in the lower extremities, bilaterally. - RIGHT: - No cystic structure found in the popliteal fossa.  LEFT: - A cystic structure is found in the popliteal fossa.  *See table(s) above for measurements and observations.    Preliminary     Microbiology: Recent Results (from the past 240 hour(s))  Resp Panel by RT-PCR (Flu A&B, Covid) Nasopharyngeal Swab     Status: None   Collection Time: 07/14/21  6:13 AM   Specimen: Nasopharyngeal Swab; Nasopharyngeal(NP) swabs in vial transport medium  Result Value Ref Range Status   SARS Coronavirus 2 by RT PCR NEGATIVE NEGATIVE Final    Comment: (NOTE) SARS-CoV-2 target nucleic acids are NOT DETECTED.  The SARS-CoV-2 RNA is generally detectable in upper respiratory specimens during the acute phase of infection. The lowest concentration of SARS-CoV-2 viral copies this assay can detect is 138 copies/mL. A negative result does not preclude SARS-Cov-2 infection and should not be used as the sole basis for treatment or other patient management decisions. A negative result may occur with  improper specimen collection/handling, submission of specimen other than nasopharyngeal swab, presence of viral mutation(s) within the areas targeted by this assay, and inadequate number of viral copies(<138 copies/mL). A negative result must be combined  with clinical observations, patient history, and epidemiological information. The expected result is Negative.  Fact Sheet for Patients:  EntrepreneurPulse.com.au  Fact Sheet for Healthcare Providers:  IncredibleEmployment.be  This test is no t yet approved or cleared by the [place] FDA and  has been authorized for detection and/or diagnosis of SARS-CoV-2 by FDA under an Emergency Use Authorization (EUA). This EUA will remain  in effect (meaning this test can be used) for the duration of the COVID-19 declaration under Section 564(b)(1) of the Act, 21 U.S.C.section 360bbb-3(b)(1), unless the authorization is terminated  or revoked sooner.       Influenza A by PCR NEGATIVE NEGATIVE Final   Influenza B by PCR NEGATIVE NEGATIVE Final    Comment: (NOTE) The Xpert Xpress SARS-CoV-2/FLU/RSV plus assay is intended as an aid in the diagnosis of influenza from Nasopharyngeal swab specimens and should not be used as a sole basis for treatment. Nasal washings and aspirates are unacceptable for Xpert Xpress SARS-CoV-2/FLU/RSV testing.  Fact Sheet for Patients: EntrepreneurPulse.com.au  Fact Sheet for Healthcare Providers: IncredibleEmployment.be  This test is not yet approved or cleared by the [place] FDA and has been authorized for detection and/or diagnosis of SARS-CoV-2 by FDA under an Emergency Use Authorization (EUA). This EUA will remain in effect (meaning this test can be used) for the duration of the COVID-19 declaration under Section 564(b)(1) of the Act, 21 U.S.C. section 360bbb-3(b)(1), unless the authorization is terminated or revoked.  Performed at [facility], [place] [address], [place], [place] [address]   Respiratory (~20 pathogens) panel by PCR     Status: None   Collection Time: 07/14/21  6:13 AM   Specimen: Nasopharyngeal Swab; Respiratory  Result Value Ref Range Status    Adenovirus NOT DETECTED NOT DETECTED Final   Coronavirus 229E NOT DETECTED NOT DETECTED Final    Comment: (NOTE) The Coronavirus on the Respiratory Panel, DOES NOT test for the novel  Coronavirus (2019 nCoV)    Coronavirus HKU1 NOT DETECTED NOT DETECTED Final   Coronavirus NL63 NOT DETECTED NOT DETECTED Final   Coronavirus OC43 NOT DETECTED NOT DETECTED Final   Metapneumovirus NOT DETECTED NOT DETECTED Final   Rhinovirus / Enterovirus NOT DETECTED NOT DETECTED Final   Influenza A NOT DETECTED NOT DETECTED Final   Influenza B NOT DETECTED NOT DETECTED Final   Parainfluenza Virus 1 NOT DETECTED NOT DETECTED Final   Parainfluenza Virus 2 NOT DETECTED NOT DETECTED Final   Parainfluenza Virus 3 NOT DETECTED NOT DETECTED Final   Parainfluenza Virus 4 NOT DETECTED NOT DETECTED Final   Respiratory Syncytial Virus NOT DETECTED NOT DETECTED Final   Bordetella pertussis NOT DETECTED NOT DETECTED Final   Bordetella Parapertussis NOT DETECTED NOT DETECTED Final   Chlamydophila pneumoniae NOT DETECTED NOT DETECTED Final   Mycoplasma pneumoniae NOT DETECTED NOT DETECTED Final    Comment: Performed at [facility], [place]. [address] [place], [place] [address]  MRSA Next Gen by PCR, Nasal     Status: None   Collection Time: 07/14/21  9:41 AM   Specimen: Nasal Mucosa; Nasal Swab  Result Value Ref Range Status   MRSA by PCR Next Gen NOT DETECTED NOT DETECTED Final    Comment: (NOTE) The GeneXpert MRSA Assay (FDA approved for NASAL specimens only), is one component of a comprehensive MRSA colonization surveillance program. It is not intended to diagnose MRSA infection nor to guide or monitor treatment for MRSA infections. Test performance is not FDA approved in patients less than 75 years old. Performed at [facility], [place]  [address] , [place] [address]      Labs: Basic Metabolic Panel: Recent Labs  Lab 07/14/21 1547 07/15/21 0056 07/16/21 0612 07/17/21 0134  07/18/21 0249  NA 133* 135 138 139 140  K 6.1* 5.6* 4.5 4.5 4.4  CL 99 103 103 105 106  CO2 23 26 28 27 26   GLUCOSE 217* 164* 104* 117* 125*  BUN 55* 51* 37* 29* 27*  CREATININE 1.39* 1.26* 1.16* 1.13* 1.13*  CALCIUM 9.7 9.8 9.9 9.7 9.5  MG  --  3.0*  --   --   --   PHOS  --  4.5  --   --   --    Liver Function Tests: Recent Labs  Lab 07/14/21 0548  AST 60*  ALT 55*  ALKPHOS 66  BILITOT 0.4  PROT 7.2  ALBUMIN 3.3*   No results for input(s): LIPASE, AMYLASE in the last 168 hours. No results for input(s): AMMONIA in the last 168 hours. CBC: Recent Labs  Lab 07/14/21 0548 07/14/21 0601 07/14/21 0615 07/14/21 1518 07/15/21 0056 07/16/21 0612 07/17/21 0134  WBC 9.0  --   --   --  7.3 10.5 8.2  NEUTROABS 6.8  --   --   --   --   --   --   HGB 9.5*   < > 10.5* 10.5* 9.1* 9.3* 9.6*  HCT 31.2*   < > 31.0* 31.0* 29.9* 30.1* 31.4*  MCV 98.4  --   --   --  95.5 93.8 95.2  PLT 138*  --   --   --  130* 138* 130*   < > = values in this interval not displayed.   Cardiac Enzymes: No results for input(s): CKTOTAL, CKMB, CKMBINDEX, TROPONINI in the last 168 hours. BNP: BNP (last 3 results) Recent Labs    07/14/21 0548 07/15/21 0056  BNP 603.3* 691.8*    ProBNP (last 3 results) No results for input(s): PROBNP in the last 8760 hours.  CBG: Recent Labs  Lab 07/17/21 1710 07/17/21 2107 07/17/21 2351 07/18/21 0540 07/18/21 0802  GLUCAP 137* 172* 133* 117* 155*       Signed:  Oswald Hillock MD.  [facility] 07/18/2021, 9:02 AM

## 2021-07-18 NOTE — TOC Initial Note (Addendum)
Transition of Care [facility]) - Initial/Assessment Note    Patient Details  Name: Erika Gates MRN: [id number] Date of Birth: 1934-12-30  Transition [facility] Care [facility] CM/SW Contact:    Durenda Guthrie, RN Phone Number: 07/18/2021, 10:06 AM  Clinical Narrative:  Case manager spoke with patient's daughter, Trellis Guirguis, concerning her mom's discharge plan. Discussed Home Health agencies. She has no preference and gave permission for CM to call referral to [facility]. CM explained that oxygen for transport, Rollator and 3in1 will be delivered to the room this morning. Patient will have family support at discharge.   Expected Discharge Plan: Home w Home Health Services Barriers to Discharge: No Barriers Identified   Patient Goals and CMS Choice Patient states their goals for this hospitalization and ongoing recovery are:: get better pr daughter   Choice offered to / list presented to : Adult Children  Expected Discharge Plan and Services Expected Discharge Plan: Home w Home Health Services   Discharge Planning Services: CM Consult Post Acute Care Choice: Durable Medical Equipment, Home Health Living arrangements for the past 2 months: Single Family Home Expected Discharge Date: 07/18/21               DME Arranged: 3-N-1, Walker rolling with seat, Oxygen Tub Transfer bench DME Agency: [employer] Date DME Agency Contacted: 07/18/21 Time DME Agency Contacted: 1005 Representative spoke with at DME Agency: Velna Hatchet HH Arranged: PT, OT HH Agency: [facility] Date [facility] Agency Contacted: 07/18/21 Time HH Agency Contacted: [phone] Representative spoke with at [facility] Agency: Kandee Keen  Prior Living Arrangements/Services Living arrangements for the past 2 months: Single Family Home Lives with:: Self Patient language and need for interpreter reviewed:: No Do you feel safe going back to the place where you live?: Yes      Need for Family Participation in Patient Care: Yes  (Comment) Care giver support system in place?: Yes (comment) Current home services: Home PT, Home OT Criminal Activity/Legal Involvement Pertinent to Current Situation/Hospitalization: No - Comment as needed  Activities of Daily Living      Permission Sought/Granted Permission sought to share information with : Case Manager       Permission granted to share info w AGENCY: [facility]        Emotional Assessment       Orientation: : Oriented to Self, Oriented to Place, Oriented to  Time, Oriented to Situation Alcohol / Substance Use: Not Applicable Psych Involvement: No (comment)  Admission diagnosis:  SOB (shortness of breath) [R06.02] Acute respiratory failure with hypoxia and hypercarbia (HCC) [J96.01, J96.02] Acute respiratory failure with hypoxia and hypercapnia (HCC) [J96.01, J96.02] Patient Active Problem List   Diagnosis Date Noted   Acute respiratory failure with hypoxia and hypercarbia (HCC) 07/14/2021   DDD (degenerative disc disease), lumbar with facet arthropathy and Spinal stenosis 03/12/2017   Primary osteoarthritis of both knees 03/12/2017   History of coronary artery disease s/p CABG  03/12/2017   Gout 12/16/2012   MORBID OBESITY 06/12/2009   VITAMIN D DEFICIENCY 01/06/2009   VENOUS INSUFFICIENCY 01/15/2008   DIABETES MELLITUS 09/15/2007   ALLERGIC RHINITIS 09/15/2007   HYPERCHOLESTEROLEMIA 08/08/2007   ANXIETY 08/08/2007   Essential hypertension 08/08/2007   Coronary atherosclerosis 08/08/2007   DEGENERATIVE JOINT DISEASE 08/08/2007   LOW BACK PAIN, CHRONIC 08/08/2007   ANEMIA, HX OF 08/08/2007   PCP:  Marletta Lor, NP Pharmacy:   [phone] - [place], [place] - [address] AT [address]  [place] [address] Phone: [phone] Fax: [phone]     Social Determinants of Health (SDOH) Interventions    Readmission Risk Interventions No flowsheet data found.

## 2021-07-24 DIAGNOSIS — D649 Anemia, unspecified: Secondary | ICD-10-CM | POA: Diagnosis not present

## 2021-07-24 DIAGNOSIS — J9601 Acute respiratory failure with hypoxia: Secondary | ICD-10-CM | POA: Diagnosis not present

## 2021-07-24 DIAGNOSIS — M21062 Valgus deformity, not elsewhere classified, left knee: Secondary | ICD-10-CM | POA: Diagnosis not present

## 2021-07-24 DIAGNOSIS — F419 Anxiety disorder, unspecified: Secondary | ICD-10-CM | POA: Diagnosis not present

## 2021-07-24 DIAGNOSIS — M21061 Valgus deformity, not elsewhere classified, right knee: Secondary | ICD-10-CM | POA: Diagnosis not present

## 2021-07-24 DIAGNOSIS — E1151 Type 2 diabetes mellitus with diabetic peripheral angiopathy without gangrene: Secondary | ICD-10-CM | POA: Diagnosis not present

## 2021-07-24 DIAGNOSIS — G8929 Other chronic pain: Secondary | ICD-10-CM | POA: Diagnosis not present

## 2021-07-24 DIAGNOSIS — I110 Hypertensive heart disease with heart failure: Secondary | ICD-10-CM | POA: Diagnosis not present

## 2021-07-24 DIAGNOSIS — I951 Orthostatic hypotension: Secondary | ICD-10-CM | POA: Diagnosis not present

## 2021-07-28 DIAGNOSIS — I5033 Acute on chronic diastolic (congestive) heart failure: Secondary | ICD-10-CM | POA: Diagnosis not present

## 2021-07-28 DIAGNOSIS — N1831 Chronic kidney disease, stage 3a: Secondary | ICD-10-CM | POA: Diagnosis not present

## 2021-07-28 DIAGNOSIS — E785 Hyperlipidemia, unspecified: Secondary | ICD-10-CM | POA: Diagnosis not present

## 2021-07-28 DIAGNOSIS — F411 Generalized anxiety disorder: Secondary | ICD-10-CM | POA: Diagnosis not present

## 2021-07-28 DIAGNOSIS — I2510 Atherosclerotic heart disease of native coronary artery without angina pectoris: Secondary | ICD-10-CM | POA: Diagnosis not present

## 2021-07-28 DIAGNOSIS — M79606 Pain in leg, unspecified: Secondary | ICD-10-CM | POA: Diagnosis not present

## 2021-07-28 DIAGNOSIS — I10 Essential (primary) hypertension: Secondary | ICD-10-CM | POA: Diagnosis not present

## 2021-07-28 DIAGNOSIS — E1122 Type 2 diabetes mellitus with diabetic chronic kidney disease: Secondary | ICD-10-CM | POA: Diagnosis not present

## 2021-07-28 DIAGNOSIS — J9601 Acute respiratory failure with hypoxia: Secondary | ICD-10-CM | POA: Diagnosis not present

## 2021-08-28 NOTE — Progress Notes (Signed)
Cardiology Office Note:    Date:  08/29/2021   ID:  Erika Gates, DOB 10/29/1934, MRN [id number]  PCP:  Donald Prose, MD  Cardiologist:  Sinclair Grooms, MD   Referring MD: Donald Prose, MD   Chief Complaint  Patient presents with   Congestive Heart Failure   Coronary Artery Disease    History of Present Illness:    Erika Gates is a 87 y.o. female with a hx of CAD with previous bypass (three-vessel coronary bypass 1996), diabetes mellitus type 2, hyperlipidemia, primary hypertension, morbid obesity, and recent hospital admission for hypercarbic hypoxic respiratory failure.   She is here today to establish with cardiology because of prior history of coronary bypass surgery.  The new concern is lower extremity swelling.  She became acutely ill in December with cough and eventually required hospitalization.  Etiology for the respiratory failure was not clear in reviewing the records.  One of her major complaints now is in addition to shortness of breath there is significant lower extremity swelling.  She does have orthopnea.  She wears oxygen at night.  No episodes of chest pain.  Past Medical History:  Diagnosis Date   Allergic rhinitis    Anemia    Anxiety    Coronary atherosclerosis of unspecified type of vessel, native or graft    Diabetes mellitus    DJD (degenerative joint disease)    Hypercholesteremia    Hypertension    Low back pain    Morbid obesity (HCC)    Venous insufficiency    Vitamin D deficiency     Past Surgical History:  Procedure Laterality Date   ABDOMINAL HYSTERECTOMY     CORONARY ARTERY BYPASS GRAFT     3 vessel- Dr. Cyndia Bent 8/96    Current Medications: Current Meds  Medication Sig   acetaminophen (TYLENOL) 500 MG tablet Take 1,000 mg by mouth every 6 (six) hours as needed for mild pain.   ALPRAZolam (XANAX) 0.5 MG tablet TAKE 1 TABLET BY MOUTH 3 TIMES A DAY AS NEEDED FOR NERVES (Patient taking differently: Take 0.5 mg by mouth in the  morning and at bedtime.)   amLODipine (NORVASC) 5 MG tablet Take 1 tablet (5 mg total) by mouth daily.   aspirin EC 81 MG tablet Take 81 mg by mouth daily. Swallow whole.   Cholecalciferol (VITAMIN D) 1000 UNITS capsule Take 2,000 Units by mouth daily.    cyclobenzaprine (FLEXERIL) 10 MG tablet Take 10 mg by mouth 2 (two) times daily as needed for muscle spasms.   diclofenac sodium (VOLTAREN) 1 % GEL APPLY 1 GM TO AFFECTED AREA UP TO 3 TIMES A DAY AS NEEDED   furosemide (LASIX) 20 MG tablet Take 1 tablet (20 mg total) by mouth daily.   gabapentin (NEURONTIN) 100 MG capsule Take 1 capsule (100 mg total) by mouth at bedtime. (Patient taking differently: Take 100 mg by mouth daily.)   gabapentin (NEURONTIN) 300 MG capsule Take 300 mg by mouth at bedtime.   glimepiride (AMARYL) 2 MG tablet Take 2 mg by mouth daily with breakfast. Pt takes 1/2 1 mg tablet once a day.   lisinopril (ZESTRIL) 20 MG tablet Take 1 tablet (20 mg total) by mouth daily.   metoprolol succinate (TOPROL-XL) 100 MG 24 hr tablet Take 1 tablet (100 mg total) by mouth daily.   Multiple Vitamins-Minerals (PROTEGRA CARDIO PO) Take 1 capsule by mouth daily.     rosuvastatin (CRESTOR) 40 MG tablet Take 40 mg by mouth  daily.   vitamin E 100 UNIT capsule Take 100 Units by mouth daily.   [DISCONTINUED] amLODipine (NORVASC) 10 MG tablet Take 10 mg by mouth daily.     Allergies:   Metformin   Social History   Socioeconomic History   Marital status: Widowed    Spouse name: Not on file   Number of children: Not on file   Years of education: Not on file   Highest education level: Not on file  Occupational History   Occupation: retired  Tobacco Use   Smoking status: Never   Smokeless tobacco: Never  Vaping Use   Vaping Use: Never used  Substance and Sexual Activity   Alcohol use: No   Drug use: Never   Sexual activity: Not on file  Other Topics Concern   Not on file  Social History Narrative   Not on file   Social  Determinants of Health   Financial Resource Strain: Not on file  Food Insecurity: Not on file  Transportation Needs: Not on file  Physical Activity: Not on file  Stress: Not on file  Social Connections: Not on file     Family History: The patient's family history includes Heart disease in her brother and mother.  ROS:   Please see the history of present illness.    She is accompanied by her grandson.  He provides some information.  She has some issues with depression when things get tough.  All other systems reviewed and are negative.  EKGs/Labs/Other Studies Reviewed:    The following studies were reviewed today:  2D Doppler echocardiogram 07/14/2021: IMPRESSIONS     1. Left ventricular ejection fraction, by estimation, is 60 to 65%. The  left ventricle has normal function. The left ventricle has no regional  wall motion abnormalities. There is mild left ventricular hypertrophy.  Left ventricular diastolic parameters  are consistent with Grade I diastolic dysfunction (impaired relaxation).  Elevated left ventricular end-diastolic pressure. The E/e' is 53.   2. Right ventricular systolic function is mildly reduced. The right  ventricular size is normal. There is mildly elevated pulmonary artery  systolic pressure. The estimated right ventricular systolic pressure is  123456 mmHg.   3. The mitral valve is grossly normal. Trivial mitral valve  regurgitation.   4. The aortic valve is tricuspid. Aortic valve regurgitation is not  visualized.   5. The inferior vena cava is dilated in size with <50% respiratory  variability, suggesting right atrial pressure of 15 mmHg.   Comparison(s): No prior Echocardiogram.    EKG:  EKG EKG is not performed today.  The tracing from 07/14/2021 Reveals severe sinus bradycardia 48 bpm and small inferior Q waves.  Recent Labs: 07/14/2021: ALT 55 07/15/2021: B Natriuretic Peptide 691.8; Magnesium 3.0 07/17/2021: Hemoglobin 9.6; Platelets  130 07/18/2021: BUN 27; Creatinine, Ser 1.13; Potassium 4.4; Sodium 140  Recent Lipid Panel    Component Value Date/Time   CHOL 178 12/16/2012 1035   TRIG 165.0 (H) 12/16/2012 1035   HDL 46.10 12/16/2012 1035   CHOLHDL 4 12/16/2012 1035   VLDL 33.0 12/16/2012 1035   LDLCALC 99 12/16/2012 1035   LDLDIRECT 120.4 12/12/2009 1016    Physical Exam:    VS:  BP (!) 142/62    Pulse 74    Ht 5\' 1"  (1.549 m)    Wt 245 lb 3.2 oz (111.2 kg)    SpO2 100%    BMI 46.33 kg/m     Wt Readings from Last 3 Encounters:  08/29/21  245 lb 3.2 oz (111.2 kg)  07/18/21 243 lb 9.7 oz (110.5 kg)  09/20/18 221 lb (100.2 kg)     GEN: Obese elderly female. No acute distress HEENT: Normal NECK: No JVD. LYMPHATICS: No lymphadenopathy CARDIAC: No murmur. RRR S4 gallop, no edema. VASCULAR: normal Pulses. No bruits. RESPIRATORY:  Clear to auscultation without rales, wheezing or rhonchi  ABDOMEN: Soft, non-tender, non-distended, No pulsatile mass, MUSCULOSKELETAL: No deformity  SKIN: Warm and dry NEUROLOGIC:  Alert and oriented x 3 PSYCHIATRIC:  Normal affect   ASSESSMENT:    1. Essential hypertension   2. Atherosclerosis of native coronary artery of native heart without angina pectoris   3. Hyperlipidemia with target LDL less than 70   4. Acute on chronic diastolic heart failure ([place])   5. Chronic respiratory failure with hypoxia and hypercapnia (HCC)   6. Peripheral edema    PLAN:    In order of problems listed above:  The BP is controlled.  She is currently on max dose amlodipine 10 mg daily along with furosemide 20 mg daily, Toprol-XL 100 mg daily, but is not adhering to salt restriction. Secondary prevention is reviewed Continue statin therapy In reviewing data, she likely has some component of diastolic heart failure contributing to fluid retention and dyspnea on exertion.  She should start Farxiga 10 mg/day if Dr. Nancy Fetter agrees.  My hesitation with starting it without approval is that she did  have hypoglycemia when admitted to the hospital recently and her medications for diabetes were significantly decreased.  When we receive approval, will commence therapy.  Plan 76-month appointment. Possibly, treating diastolic heart failure will help pulmonary process. Peripheral edema is likely multifactorial.  If we can remove contribution by diastolic heart failure perhaps there will be some improvement.  Wilder Glade would help with this.  Also decreasing amlodipine to 5 mg/day will be helpful.   Medication Adjustments/Labs and Tests Ordered: Current medicines are reviewed at length with the patient today.  Concerns regarding medicines are outlined above.  No orders of the defined types were placed in this encounter.  Meds ordered this encounter  Medications   amLODipine (NORVASC) 5 MG tablet    Sig: Take 1 tablet (5 mg total) by mouth daily.    Dispense:  90 tablet    Refill:  3    Dose change    Patient Instructions  Medication Instructions:  1) DECREASE Amlodipine to 5mg  once daily 2) Dr. Tamala Julian is going to reach out to Dr. Nancy Fetter about starting you on Farxiga 10mg  once daily.  Once we hear from her, we will be in touch about next steps.   *If you need a refill on your cardiac medications before your next appointment, please call your pharmacy*   Lab Work: None If you have labs (blood work) drawn today and your tests are completely normal, you will receive your results only by: [place] (if you have MyChart) OR A paper copy in the mail If you have any lab test that is abnormal or we need to change your treatment, we will call you to review the results.   Testing/Procedures: None   Follow-Up: At [facility], you and your health needs are our priority.  As part of our continuing mission to provide you with exceptional heart care, we have created designated Provider Care Teams.  These Care Teams include your primary Cardiologist (physician) and Advanced Practice Providers  (APPs -  Physician Assistants and Nurse Practitioners) who all work together to provide you with  the care you need, when you need it.  We recommend signing up for the patient portal called "MyChart".  Sign up information is provided on this After Visit Summary.  MyChart is used to connect with patients for Virtual Visits (Telemedicine).  Patients are able to view lab/test results, encounter notes, upcoming appointments, etc.  Non-urgent messages can be sent to your provider as well.   To learn more about what you can do with MyChart, go to [email or web address].    Your next appointment:   2 month(s)  The format for your next appointment:   In Person  Provider:   Sinclair Grooms, MD     Other Instructions     Signed, Sinclair Grooms, MD  08/29/2021 3:54 PM    [place]

## 2021-08-29 ENCOUNTER — Other Ambulatory Visit: Payer: Self-pay

## 2021-08-29 ENCOUNTER — Encounter: Payer: Self-pay | Admitting: Interventional Cardiology

## 2021-08-29 ENCOUNTER — Ambulatory Visit: Payer: Medicare PPO | Admitting: Interventional Cardiology

## 2021-08-29 VITALS — BP 142/62 | HR 74 | Ht 61.0 in | Wt 245.2 lb

## 2021-08-29 DIAGNOSIS — R609 Edema, unspecified: Secondary | ICD-10-CM

## 2021-08-29 DIAGNOSIS — E785 Hyperlipidemia, unspecified: Secondary | ICD-10-CM

## 2021-08-29 DIAGNOSIS — J9612 Chronic respiratory failure with hypercapnia: Secondary | ICD-10-CM | POA: Diagnosis not present

## 2021-08-29 DIAGNOSIS — J9611 Chronic respiratory failure with hypoxia: Secondary | ICD-10-CM | POA: Diagnosis not present

## 2021-08-29 DIAGNOSIS — I2510 Atherosclerotic heart disease of native coronary artery without angina pectoris: Secondary | ICD-10-CM

## 2021-08-29 DIAGNOSIS — I10 Essential (primary) hypertension: Secondary | ICD-10-CM | POA: Diagnosis not present

## 2021-08-29 DIAGNOSIS — I5033 Acute on chronic diastolic (congestive) heart failure: Secondary | ICD-10-CM | POA: Diagnosis not present

## 2021-08-29 MED ORDER — AMLODIPINE BESYLATE 5 MG PO TABS: 5.0000 mg | ORAL_TABLET | Freq: Every day | ORAL | 3 refills | Status: DC

## 2021-08-29 NOTE — Patient Instructions (Signed)
Medication Instructions:  1) DECREASE Amlodipine to 5mg  once daily 2) Dr. is going to reach out to Dr. Katrinka Blazing about starting you on Farxiga 10mg  once daily.  Once we hear from her, we will be in touch about next steps.   *If you need a refill on your cardiac medications before your next appointment, please call your pharmacy*   Lab Work: None If you have labs (blood work) drawn today and your tests are completely normal, you will receive your results only by: MyChart Message (if you have MyChart) OR A paper copy in the mail If you have any lab test that is abnormal or we need to change your treatment, we will call you to review the results.   Testing/Procedures: None   Follow-Up: At [facility], you and your health needs are our priority.  As part of our continuing mission to provide you with exceptional heart care, we have created designated Provider Care Teams.  These Care Teams include your primary Cardiologist (physician) and Advanced Practice Providers (APPs -  Physician Assistants and Nurse Practitioners) who all work together to provide you with the care you need, when you need it.  We recommend signing up for the patient portal called "MyChart".  Sign up information is provided on this After Visit Summary.  MyChart is used to connect with patients for Virtual Visits (Telemedicine).  Patients are able to view lab/test results, encounter notes, upcoming appointments, etc.  Non-urgent messages can be sent to your provider as well.   To learn more about what you can do with MyChart, go to .    Your next appointment:   2 month(s)  The format for your next appointment:   In Person  Provider:   [facility], MD     Other Instructions

## 2021-08-29 NOTE — Progress Notes (Deleted)
Cardiology Office Note:    Date:  08/29/2021   ID:  Erika Gates, DOB [birth year], MRN [id number]  PCP:  Deatra JamesSun, Vyvyan, MD  Cardiologist:  Lesleigh NoeHenry W Scharlene Catalina III, MD   Referring MD: Deatra JamesSun, Vyvyan, MD   No chief complaint on file.   History of Present Illness:    Erika Gates is a 87 y.o. female with a hx of ***  Past Medical History:  Diagnosis Date   Allergic rhinitis    Anemia    Anxiety    Coronary atherosclerosis of unspecified type of vessel, native or graft    Diabetes mellitus    DJD (degenerative joint disease)    Hypercholesteremia    Hypertension    Low back pain    Morbid obesity (HCC)    Venous insufficiency    Vitamin D deficiency     Past Surgical History:  Procedure Laterality Date   ABDOMINAL HYSTERECTOMY     CORONARY ARTERY BYPASS GRAFT     3 vessel- Dr. Laneta SimmersBartle 8/96    Current Medications: Current Meds  Medication Sig   acetaminophen (TYLENOL) 500 MG tablet Take 1,000 mg by mouth every 6 (six) hours as needed for mild pain.   ALPRAZolam (XANAX) 0.5 MG tablet TAKE 1 TABLET BY MOUTH 3 TIMES A DAY AS NEEDED FOR NERVES (Patient taking differently: Take 0.5 mg by mouth in the morning and at bedtime.)   amLODipine (NORVASC) 5 MG tablet Take 1 tablet (5 mg total) by mouth daily.   aspirin EC 81 MG tablet Take 81 mg by mouth daily. Swallow whole.   Cholecalciferol (VITAMIN D) 1000 UNITS capsule Take 2,000 Units by mouth daily.    cyclobenzaprine (FLEXERIL) 10 MG tablet Take 10 mg by mouth 2 (two) times daily as needed for muscle spasms.   diclofenac sodium (VOLTAREN) 1 % GEL APPLY 1 GM TO AFFECTED AREA UP TO 3 TIMES A DAY AS NEEDED   furosemide (LASIX) 20 MG tablet Take 1 tablet (20 mg total) by mouth daily.   gabapentin (NEURONTIN) 100 MG capsule Take 1 capsule (100 mg total) by mouth at bedtime. (Patient taking differently: Take 100 mg by mouth daily.)   gabapentin (NEURONTIN) 300 MG capsule Take 300 mg by mouth at bedtime.   glimepiride (AMARYL) 2 MG  tablet Take 2 mg by mouth daily with breakfast. Pt takes 1/2 1 mg tablet once a day.   lisinopril (ZESTRIL) 20 MG tablet Take 1 tablet (20 mg total) by mouth daily.   metoprolol succinate (TOPROL-XL) 100 MG 24 hr tablet Take 1 tablet (100 mg total) by mouth daily.   Multiple Vitamins-Minerals (PROTEGRA CARDIO PO) Take 1 capsule by mouth daily.     rosuvastatin (CRESTOR) 40 MG tablet Take 40 mg by mouth daily.   vitamin E 100 UNIT capsule Take 100 Units by mouth daily.   [DISCONTINUED] amLODipine (NORVASC) 10 MG tablet Take 10 mg by mouth daily.     Allergies:   Metformin   Social History   Socioeconomic History   Marital status: Widowed    Spouse name: Not on file   Number of children: Not on file   Years of education: Not on file   Highest education level: Not on file  Occupational History   Occupation: retired  Tobacco Use   Smoking status: Never   Smokeless tobacco: Never  Vaping Use   Vaping Use: Never used  Substance and Sexual Activity   Alcohol use: No   Drug use: Never  Sexual activity: Not on file  Other Topics Concern   Not on file  Social History Narrative   Not on file   Social Determinants of Health   Financial Resource Strain: Not on file  Food Insecurity: Not on file  Transportation Needs: Not on file  Physical Activity: Not on file  Stress: Not on file  Social Connections: Not on file     Family History: The patient's family history includes Heart disease in her brother and mother.  ROS:   Please see the history of present illness.    *** All other systems reviewed and are negative.  EKGs/Labs/Other Studies Reviewed:    The following studies were reviewed today: ***  EKG:  EKG ***  Recent Labs: 07/14/2021: ALT 55 07/15/2021: B Natriuretic Peptide 691.8; Magnesium 3.0 07/17/2021: Hemoglobin 9.6; Platelets 130 07/18/2021: BUN 27; Creatinine, Ser 1.13; Potassium 4.4; Sodium 140  Recent Lipid Panel    Component Value Date/Time   CHOL 178  12/16/2012 1035   TRIG 165.0 (H) 12/16/2012 1035   HDL 46.10 12/16/2012 1035   CHOLHDL 4 12/16/2012 1035   VLDL 33.0 12/16/2012 1035   LDLCALC 99 12/16/2012 1035   LDLDIRECT 120.4 12/12/2009 1016    Physical Exam:    VS:  BP (!) 142/62    Pulse 74    Ht 5\' 1"  (1.549 m)    Wt 245 lb 3.2 oz (111.2 kg)    SpO2 100%    BMI 46.33 kg/m     Wt Readings from Last 3 Encounters:  08/29/21 245 lb 3.2 oz (111.2 kg)  07/18/21 243 lb 9.7 oz (110.5 kg)  09/20/18 221 lb (100.2 kg)     GEN: ***. No acute distress HEENT: Normal NECK: No JVD. LYMPHATICS: No lymphadenopathy CARDIAC: *** murmur. RRR *** gallop, or edema. VASCULAR: *** Normal Pulses. No bruits. RESPIRATORY:  Clear to auscultation without rales, wheezing or rhonchi  ABDOMEN: Soft, non-tender, non-distended, No pulsatile mass, MUSCULOSKELETAL: No deformity  SKIN: Warm and dry NEUROLOGIC:  Alert and oriented x 3 PSYCHIATRIC:  Normal affect   ASSESSMENT:    1. Essential hypertension   2. Atherosclerosis of native coronary artery of native heart without angina pectoris   3. Hyperlipidemia with target LDL less than 70   4. Acute on chronic diastolic heart failure (HCC)   5. Chronic respiratory failure with hypoxia and hypercapnia (HCC)    PLAN:    In order of problems listed above:  ***   Medication Adjustments/Labs and Tests Ordered: Current medicines are reviewed at length with the patient today.  Concerns regarding medicines are outlined above.  No orders of the defined types were placed in this encounter.  Meds ordered this encounter  Medications   amLODipine (NORVASC) 5 MG tablet    Sig: Take 1 tablet (5 mg total) by mouth daily.    Dispense:  90 tablet    Refill:  3    Dose change    Patient Instructions  Medication Instructions:  1) DECREASE Amlodipine to 5mg  once daily 2) Dr. 09/22/18 is going to reach out to Dr. about starting you on Farxiga 10mg  once daily.  Once we hear from her, we will be in touch  about next steps.   *If you need a refill on your cardiac medications before your next appointment, please call your pharmacy*   Lab Work: None If you have labs (blood work) drawn today and your tests are completely normal, you will receive your results only by: MyChart  Message (if you have MyChart) OR A paper copy in the mail If you have any lab test that is abnormal or we need to change your treatment, we will call you to review the results.   Testing/Procedures: None   Follow-Up: At [facility], you and your health needs are our priority.  As part of our continuing mission to provide you with exceptional heart care, we have created designated Provider Care Teams.  These Care Teams include your primary Cardiologist (physician) and Advanced Practice Providers (APPs -  Physician Assistants and Nurse Practitioners) who all work together to provide you with the care you need, when you need it.  We recommend signing up for the patient portal called "MyChart".  Sign up information is provided on this After Visit Summary.  MyChart is used to connect with patients for Virtual Visits (Telemedicine).  Patients are able to view lab/test results, encounter notes, upcoming appointments, etc.  Non-urgent messages can be sent to your provider as well.   To learn more about what you can do with MyChart, go to [email or web address].    Your next appointment:   2 month(s)  The format for your next appointment:   In Person  Provider:   Lesleigh Noe, MD     Other Instructions     Signed, Lesleigh Noe, MD  08/29/2021 3:45 PM    [facility]

## 2021-09-05 ENCOUNTER — Telehealth: Payer: Self-pay | Admitting: Interventional Cardiology

## 2021-09-05 DIAGNOSIS — I5033 Acute on chronic diastolic (congestive) heart failure: Secondary | ICD-10-CM

## 2021-09-05 MED ORDER — DAPAGLIFLOZIN PROPANEDIOL 10 MG PO TABS: 10.0000 mg | ORAL_TABLET | Freq: Every day | ORAL | 11 refills | Status: DC

## 2021-09-05 NOTE — Telephone Encounter (Signed)
Received call from greeter today around noon that pt's daughter was out front to pick up samples.  Advised that we were still waiting to hear from Dr. Lynnda Child office about whether or not it was ok for pt to use this medication.  Daughter told her they received a call from Dr. Lynnda Child office late yesterday that it was ok to use and then they called here this morning and were told that we would have the samples ready in about 30 mins.  There is no documentation that a sample request was ever made.  Received secure chat after lunch from front desk inquiring about an update on if we had heard from Dr. Lynnda Child office because daughter was waiting downstairs.  Advised we have not heard from Dr. Lynnda Child office and we are currently out of [place] samples.  Advised to let her know that we will call once we hear from Dr. Lynnda Child office and we will get samples at that time if they are available.  Received another message that the daughter was upset.  Front desk transferred the call to me.  Daughter was very upset by all of the confusion there has been about her mother being able to start [place].  Daughter states when she was speaking with the front desk, she called Dr. Lynnda Child office so they could give permission.  Advised daughter that they are unable to take that kind of information as they are not clinical.  Explained that a nurse or CMA would have to get confirmation from Dr. Lynnda Child office that it was ok to use that medication.  Daughter became upset that this wasn't mentioned in the beginning when she called Dr. Lynnda Child office.  Advised daughter to have Dr. Lynnda Child office reach out to [place] in regards to the ok to take [place].  She asked that I call their office and I advised I would try to as soon as I could but I am in clinic and not sure when I could call.  Was able to call Dr. Lynnda Child office within about 10-15 mins and spoke with Dr. Lynnda Child assist who provided me the information that Dr. Nancy Fetter gave approval for pt to use [place].  She  mentioned that they have placed samples at their front desk for pt.  Thanked her for her help.  Called daughter and let her know that I spoke with Dr. Lynnda Child office.  She is headed there to get samples.  Advised I will send discount card and pt assistance information in the mail to them.  Advised her to let us know if any trouble after starting the medication.  Daughter was very appreciative for call and was much calmer now that we were able to get everything squared away.  I was very apologetic for all of the confusion.    Pt will need to come in for a BMET in about 2 weeks.  Daughter will call back to schedule this once she and her son can look at the calendar to see who can bring her.

## 2021-09-17 ENCOUNTER — Telehealth: Payer: Self-pay | Admitting: Interventional Cardiology

## 2021-09-17 NOTE — Telephone Encounter (Signed)
Spoke with daughter and scheduled pt to have labs drawn 09/23/21.  Daughter appreciative for call.

## 2021-09-17 NOTE — Telephone Encounter (Signed)
Pt c/o medication issue:  1. Name of Medication: dapagliflozin propanediol (FARXIGA) 10 MG TABS tablet  2. How are you currently taking this medication (dosage and times per day)? Take 1 tablet (10 mg total) by mouth daily before breakfast.  3. Are you having a reaction (difficulty breathing--STAT)? no  4. What is your medication issue? Daughter is calling asking bout the samples for the medication.

## 2021-09-17 NOTE — Telephone Encounter (Signed)
Spoke with daughter and she was checking to see if we ever got samples in.  Advised we did and I would place some at the front desk along with a 30 day free card and pt assistance forms.  Daughter very appreciative for assistance.

## 2021-09-22 DIAGNOSIS — J9601 Acute respiratory failure with hypoxia: Secondary | ICD-10-CM | POA: Diagnosis not present

## 2021-09-22 DIAGNOSIS — I872 Venous insufficiency (chronic) (peripheral): Secondary | ICD-10-CM | POA: Diagnosis not present

## 2021-09-22 DIAGNOSIS — I110 Hypertensive heart disease with heart failure: Secondary | ICD-10-CM | POA: Diagnosis not present

## 2021-09-22 DIAGNOSIS — I951 Orthostatic hypotension: Secondary | ICD-10-CM | POA: Diagnosis not present

## 2021-09-22 DIAGNOSIS — M21062 Valgus deformity, not elsewhere classified, left knee: Secondary | ICD-10-CM | POA: Diagnosis not present

## 2021-09-22 DIAGNOSIS — M21061 Valgus deformity, not elsewhere classified, right knee: Secondary | ICD-10-CM | POA: Diagnosis not present

## 2021-09-22 DIAGNOSIS — E1151 Type 2 diabetes mellitus with diabetic peripheral angiopathy without gangrene: Secondary | ICD-10-CM | POA: Diagnosis not present

## 2021-09-22 DIAGNOSIS — I5033 Acute on chronic diastolic (congestive) heart failure: Secondary | ICD-10-CM | POA: Diagnosis not present

## 2021-09-22 DIAGNOSIS — I2510 Atherosclerotic heart disease of native coronary artery without angina pectoris: Secondary | ICD-10-CM | POA: Diagnosis not present

## 2021-09-23 ENCOUNTER — Other Ambulatory Visit: Payer: Medicare PPO

## 2021-09-24 ENCOUNTER — Other Ambulatory Visit: Payer: Self-pay | Admitting: Interventional Cardiology

## 2021-09-24 ENCOUNTER — Other Ambulatory Visit: Payer: Medicare PPO

## 2021-09-24 DIAGNOSIS — I5033 Acute on chronic diastolic (congestive) heart failure: Secondary | ICD-10-CM | POA: Diagnosis not present

## 2021-09-25 LAB — BASIC METABOLIC PANEL
BUN/Creatinine Ratio: 22 (ref 12–28)
BUN: 22 mg/dL (ref 8–27)
CO2: 28 mmol/L (ref 20–29)
Calcium: 9.7 mg/dL (ref 8.7–10.3)
Chloride: 106 mmol/L (ref 96–106)
Creatinine, Ser: 1.02 mg/dL — ABNORMAL HIGH (ref 0.57–1.00)
Glucose: 125 mg/dL — ABNORMAL HIGH (ref 70–99)
Potassium: 3.5 mmol/L (ref 3.5–5.2)
Sodium: 146 mmol/L — ABNORMAL HIGH (ref 134–144)
eGFR: 54 mL/min/{1.73_m2} — ABNORMAL LOW (ref >59)

## 2021-10-13 ENCOUNTER — Ambulatory Visit: Payer: Medicare PPO | Admitting: Interventional Cardiology

## 2021-10-27 NOTE — Progress Notes (Signed)
Cardiology Office Note:    Date:  10/28/2021   ID:  Erika Gates, DOB 1934-10-18, MRN [id number]  PCP:  Donald Prose, MD  Cardiologist:  Sinclair Grooms, MD   Referring MD: Donald Prose, MD   Chief Complaint  Patient presents with   Congestive Heart Failure    History of Present Illness:    Erika Gates is a 87 y.o. female with a hx of  CAD with previous bypass (three-vessel coronary bypass 1996), diabetes mellitus type 2, hyperlipidemia, primary hypertension, morbid obesity, and recent hospital admission for hypercarbic hypoxic respiratory failure.   Breathing has improved.  She was started on Farxiga approximately 3 weeks ago.  She is running out of samples.  She wants to stay on the medication.  She denies orthopnea.  No medication side effect.  Past Medical History:  Diagnosis Date   Allergic rhinitis    Anemia    Anxiety    Coronary atherosclerosis of unspecified type of vessel, native or graft    Diabetes mellitus    DJD (degenerative joint disease)    Hypercholesteremia    Hypertension    Low back pain    Morbid obesity (HCC)    Venous insufficiency    Vitamin D deficiency     Past Surgical History:  Procedure Laterality Date   ABDOMINAL HYSTERECTOMY     CORONARY ARTERY BYPASS GRAFT     3 vessel- Dr. Cyndia Bent 8/96    Current Medications: Current Meds  Medication Sig   acetaminophen (TYLENOL) 500 MG tablet Take 1,000 mg by mouth every 6 (six) hours as needed for mild pain.   ALPRAZolam (XANAX) 0.5 MG tablet TAKE 1 TABLET BY MOUTH 3 TIMES A DAY AS NEEDED FOR NERVES (Patient taking differently: Take 0.5 mg by mouth in the morning and at bedtime.)   amLODipine (NORVASC) 5 MG tablet Take 1 tablet (5 mg total) by mouth daily.   aspirin EC 81 MG tablet Take 81 mg by mouth daily. Swallow whole.   Cholecalciferol (VITAMIN D) 1000 UNITS capsule Take 2,000 Units by mouth daily.    cyclobenzaprine (FLEXERIL) 10 MG tablet Take 10 mg by mouth 2 (two) times daily as  needed for muscle spasms.   dapagliflozin propanediol (FARXIGA) 10 MG TABS tablet Take 1 tablet (10 mg total) by mouth daily before breakfast.   diclofenac sodium (VOLTAREN) 1 % GEL APPLY 1 GM TO AFFECTED AREA UP TO 3 TIMES A DAY AS NEEDED   furosemide (LASIX) 20 MG tablet Take 1 tablet (20 mg total) by mouth daily.   gabapentin (NEURONTIN) 100 MG capsule Take 1 capsule (100 mg total) by mouth at bedtime. (Patient taking differently: Take 100 mg by mouth daily.)   gabapentin (NEURONTIN) 300 MG capsule Take 300 mg by mouth at bedtime.   glimepiride (AMARYL) 2 MG tablet Take 2 mg by mouth daily with breakfast. Pt takes 1/2 1 mg tablet once a day.   lisinopril (ZESTRIL) 20 MG tablet Take 1 tablet (20 mg total) by mouth daily.   metoprolol succinate (TOPROL-XL) 100 MG 24 hr tablet Take 1 tablet (100 mg total) by mouth daily.   Multiple Vitamins-Minerals (PROTEGRA CARDIO PO) Take 1 capsule by mouth daily.     rosuvastatin (CRESTOR) 40 MG tablet Take 40 mg by mouth daily.   vitamin E 100 UNIT capsule Take 100 Units by mouth daily.     Allergies:   Metformin   Social History   Socioeconomic History   Marital status:  Widowed    Spouse name: Not on file   Number of children: Not on file   Years of education: Not on file   Highest education level: Not on file  Occupational History   Occupation: retired  Tobacco Use   Smoking status: Never   Smokeless tobacco: Never  Vaping Use   Vaping Use: Never used  Substance and Sexual Activity   Alcohol use: No   Drug use: Never   Sexual activity: Not on file  Other Topics Concern   Not on file  Social History Narrative   Not on file   Social Determinants of Health   Financial Resource Strain: Not on file  Food Insecurity: Not on file  Transportation Needs: Not on file  Physical Activity: Not on file  Stress: Not on file  Social Connections: Not on file     Family History: The patient's family history includes Heart disease in her  brother and mother.  ROS:   Please see the history of present illness.    Accompanied by her grandson.  All other systems reviewed and are negative.  EKGs/Labs/Other Studies Reviewed:    The following studies were reviewed today: ECHOCARDIOGRAPHY 2022: IMPRESSIONS   1. Left ventricular ejection fraction, by estimation, is 60 to 65%. The  left ventricle has normal function. The left ventricle has no regional  wall motion abnormalities. There is mild left ventricular hypertrophy.  Left ventricular diastolic parameters  are consistent with Grade I diastolic dysfunction (impaired relaxation).  Elevated left ventricular end-diastolic pressure. The E/e' is 23.   2. Right ventricular systolic function is mildly reduced. The right  ventricular size is normal. There is mildly elevated pulmonary artery  systolic pressure. The estimated right ventricular systolic pressure is  123456 mmHg.   3. The mitral valve is grossly normal. Trivial mitral valve  regurgitation.   4. The aortic valve is tricuspid. Aortic valve regurgitation is not  visualized.   5. The inferior vena cava is dilated in size with <50% respiratory  variability, suggesting right atrial pressure of 15 mmHg.   Comparison(s): No prior Echocardiogram.   EKG:  EKG not repeated  Recent Labs: 07/14/2021: ALT 55 07/15/2021: B Natriuretic Peptide 691.8; Magnesium 3.0 07/17/2021: Hemoglobin 9.6; Platelets 130 09/24/2021: BUN 22; Creatinine, Ser 1.02; Potassium 3.5; Sodium 146  Recent Lipid Panel    Component Value Date/Time   CHOL 178 12/16/2012 1035   TRIG 165.0 (H) 12/16/2012 1035   HDL 46.10 12/16/2012 1035   CHOLHDL 4 12/16/2012 1035   VLDL 33.0 12/16/2012 1035   LDLCALC 99 12/16/2012 1035   LDLDIRECT 120.4 12/12/2009 1016    Physical Exam:    VS:  BP (!) 168/68    Pulse 77    Ht 5\' 1"  (1.549 m)    Wt 226 lb 12.8 oz (102.9 kg)    SpO2 98%    BMI 42.85 kg/m     Wt Readings from Last 3 Encounters:  10/28/21 226 lb 12.8  oz (102.9 kg)  08/29/21 245 lb 3.2 oz (111.2 kg)  07/18/21 243 lb 9.7 oz (110.5 kg)     GEN: Morbid obesity. No acute distress HEENT: Normal NECK: No JVD. LYMPHATICS: No lymphadenopathy CARDIAC: No murmur. RRR S4 gallop, or edema. VASCULAR:  Normal Pulses. No bruits. RESPIRATORY:  Clear to auscultation without rales, wheezing or rhonchi  ABDOMEN: Soft, non-tender, non-distended, No pulsatile mass, MUSCULOSKELETAL: No deformity  SKIN: Warm and dry NEUROLOGIC:  Alert and oriented x 3 PSYCHIATRIC:  Normal affect  ASSESSMENT:    1. Acute on chronic diastolic heart failure ([place])   2. Essential hypertension   3. Atherosclerosis of native coronary artery of native heart without angina pectoris   4. Hyperlipidemia with target LDL less than 70   5. Chronic respiratory failure with hypoxia and hypercapnia (HCC)    PLAN:    In order of problems listed above:  There has been a 20 pound reduction in weight since the August 29, 2021 office visit..  This is indeed partially due to the effect of SGLT2 therapy.  There has been improvement in dyspnea.  Basic metabolic panel performed February 1 is within normal limits.  Salt restriction recommended. Blood pressure control is adequate. No angina or chest discomfort. Continue rosuvastatin 40 mg/day. Chronic lung disease is contributing to his shortness of breath as well   Medication Adjustments/Labs and Tests Ordered: Current medicines are reviewed at length with the patient today.  Concerns regarding medicines are outlined above.  No orders of the defined types were placed in this encounter.  No orders of the defined types were placed in this encounter.   Patient Instructions  Medication Instructions:  Your physician recommends that you continue on your current medications as directed. Please refer to the Current Medication list given to you today.  *If you need a refill on your cardiac medications before your next appointment, please  call your pharmacy*   Lab Work: None today If you have labs (blood work) drawn today and your tests are completely normal, you will receive your results only by: [place] (if you have MyChart) OR A paper copy in the mail If you have any lab test that is abnormal or we need to change your treatment, we will call you to review the results.   Testing/Procedures: None today   Follow-Up: At [facility], you and your health needs are our priority.  As part of our continuing mission to provide you with exceptional heart care, we have created designated Provider Care Teams.  These Care Teams include your primary Cardiologist (physician) and Advanced Practice Providers (APPs -  Physician Assistants and Nurse Practitioners) who all work together to provide you with the care you need, when you need it.  We recommend signing up for the patient portal called "MyChart".  Sign up information is provided on this After Visit Summary.  MyChart is used to connect with patients for Virtual Visits (Telemedicine).  Patients are able to view lab/test results, encounter notes, upcoming appointments, etc.  Non-urgent messages can be sent to your provider as well.   To learn more about what you can do with MyChart, go to [email or web address].    Your next appointment:   9 month(s)  The format for your next appointment:   In Person  Provider:   Sinclair Grooms, MD        Signed, Sinclair Grooms, MD  10/28/2021 12:03 PM    [place]

## 2021-10-28 ENCOUNTER — Other Ambulatory Visit: Payer: Self-pay

## 2021-10-28 ENCOUNTER — Encounter: Payer: Self-pay | Admitting: Interventional Cardiology

## 2021-10-28 ENCOUNTER — Ambulatory Visit: Payer: Medicare PPO | Admitting: Interventional Cardiology

## 2021-10-28 VITALS — BP 168/68 | HR 77 | Ht 61.0 in | Wt 226.8 lb

## 2021-10-28 DIAGNOSIS — I2510 Atherosclerotic heart disease of native coronary artery without angina pectoris: Secondary | ICD-10-CM

## 2021-10-28 DIAGNOSIS — J9612 Chronic respiratory failure with hypercapnia: Secondary | ICD-10-CM | POA: Diagnosis not present

## 2021-10-28 DIAGNOSIS — I5033 Acute on chronic diastolic (congestive) heart failure: Secondary | ICD-10-CM

## 2021-10-28 DIAGNOSIS — I10 Essential (primary) hypertension: Secondary | ICD-10-CM

## 2021-10-28 DIAGNOSIS — J9611 Chronic respiratory failure with hypoxia: Secondary | ICD-10-CM | POA: Diagnosis not present

## 2021-10-28 DIAGNOSIS — E785 Hyperlipidemia, unspecified: Secondary | ICD-10-CM | POA: Diagnosis not present

## 2021-10-28 NOTE — Patient Instructions (Addendum)
Medication Instructions:  ?Your physician recommends that you continue on your current medications as directed. Please refer to the Current Medication list given to you today. ? ?*If you need a refill on your cardiac medications before your next appointment, please call your pharmacy* ? ? ?Lab Work: ?None today ?If you have labs (blood work) drawn today and your tests are completely normal, you will receive your results only by: ?MyChart Message (if you have MyChart) OR ?A paper copy in the mail ?If you have any lab test that is abnormal or we need to change your treatment, we will call you to review the results. ? ? ?Testing/Procedures: ?None today ? ? ?Follow-Up: ?At [facility], you and your health needs are our priority.  As part of our continuing mission to provide you with exceptional heart care, we have created designated Provider Care Teams.  These Care Teams include your primary Cardiologist (physician) and Advanced Practice Providers (APPs -  Physician Assistants and Nurse Practitioners) who all work together to provide you with the care you need, when you need it. ? ?We recommend signing up for the patient portal called "MyChart".  Sign up information is provided on this After Visit Summary.  MyChart is used to connect with patients for Virtual Visits (Telemedicine).  Patients are able to view lab/test results, encounter notes, upcoming appointments, etc.  Non-urgent messages can be sent to your provider as well.   ?To learn more about what you can do with MyChart, go to [email or web address].   ? ?Your next appointment:   ?9-12 month(s) ? ?The format for your next appointment:   ?In Person ? ?Provider:   ?Sinclair Grooms, MD   ? ?  ?

## 2022-02-02 ENCOUNTER — Other Ambulatory Visit: Payer: Self-pay | Admitting: Interventional Cardiology

## 2022-02-02 MED ORDER — DAPAGLIFLOZIN PROPANEDIOL 10 MG PO TABS: 10.0000 mg | ORAL_TABLET | Freq: Every day | ORAL | 9 refills | Status: AC

## 2022-02-20 DIAGNOSIS — E1122 Type 2 diabetes mellitus with diabetic chronic kidney disease: Secondary | ICD-10-CM | POA: Diagnosis not present

## 2022-02-20 DIAGNOSIS — I5032 Chronic diastolic (congestive) heart failure: Secondary | ICD-10-CM | POA: Diagnosis not present

## 2022-02-20 DIAGNOSIS — I10 Essential (primary) hypertension: Secondary | ICD-10-CM | POA: Diagnosis not present

## 2022-02-20 DIAGNOSIS — I2510 Atherosclerotic heart disease of native coronary artery without angina pectoris: Secondary | ICD-10-CM | POA: Diagnosis not present

## 2022-02-20 DIAGNOSIS — F411 Generalized anxiety disorder: Secondary | ICD-10-CM | POA: Diagnosis not present

## 2022-02-20 DIAGNOSIS — Z951 Presence of aortocoronary bypass graft: Secondary | ICD-10-CM | POA: Diagnosis not present

## 2022-02-20 DIAGNOSIS — N1831 Chronic kidney disease, stage 3a: Secondary | ICD-10-CM | POA: Diagnosis not present

## 2022-02-20 DIAGNOSIS — Z0000 Encounter for general adult medical examination without abnormal findings: Secondary | ICD-10-CM | POA: Diagnosis not present

## 2022-02-20 DIAGNOSIS — E785 Hyperlipidemia, unspecified: Secondary | ICD-10-CM | POA: Diagnosis not present

## 2022-03-04 ENCOUNTER — Emergency Department (HOSPITAL_COMMUNITY)
Admission: EM | Admit: 2022-03-04 | Discharge: 2022-03-04 | Disposition: A | Discharge status: Home / Self Care | Payer: Medicare PPO | Attending: Emergency Medicine | Admitting: Emergency Medicine

## 2022-03-04 ENCOUNTER — Emergency Department (HOSPITAL_COMMUNITY): Payer: Medicare PPO

## 2022-03-04 ENCOUNTER — Encounter (HOSPITAL_COMMUNITY): Payer: Self-pay

## 2022-03-04 DIAGNOSIS — W01198A Fall on same level from slipping, tripping and stumbling with subsequent striking against other object, initial encounter: Secondary | ICD-10-CM | POA: Diagnosis not present

## 2022-03-04 DIAGNOSIS — Y92002 Bathroom of unspecified non-institutional (private) residence single-family (private) house as the place of occurrence of the external cause: Secondary | ICD-10-CM | POA: Diagnosis not present

## 2022-03-04 DIAGNOSIS — S0101XA Laceration without foreign body of scalp, initial encounter: Secondary | ICD-10-CM | POA: Insufficient documentation

## 2022-03-04 DIAGNOSIS — I2510 Atherosclerotic heart disease of native coronary artery without angina pectoris: Secondary | ICD-10-CM | POA: Diagnosis not present

## 2022-03-04 DIAGNOSIS — I10 Essential (primary) hypertension: Secondary | ICD-10-CM | POA: Insufficient documentation

## 2022-03-04 DIAGNOSIS — M4312 Spondylolisthesis, cervical region: Secondary | ICD-10-CM | POA: Diagnosis not present

## 2022-03-04 DIAGNOSIS — Z23 Encounter for immunization: Secondary | ICD-10-CM | POA: Diagnosis not present

## 2022-03-04 DIAGNOSIS — Z7982 Long term (current) use of aspirin: Secondary | ICD-10-CM | POA: Insufficient documentation

## 2022-03-04 DIAGNOSIS — S0990XA Unspecified injury of head, initial encounter: Secondary | ICD-10-CM | POA: Diagnosis not present

## 2022-03-04 DIAGNOSIS — S199XXA Unspecified injury of neck, initial encounter: Secondary | ICD-10-CM | POA: Diagnosis not present

## 2022-03-04 DIAGNOSIS — E119 Type 2 diabetes mellitus without complications: Secondary | ICD-10-CM | POA: Insufficient documentation

## 2022-03-04 DIAGNOSIS — R58 Hemorrhage, not elsewhere classified: Secondary | ICD-10-CM | POA: Diagnosis not present

## 2022-03-04 DIAGNOSIS — Z79899 Other long term (current) drug therapy: Secondary | ICD-10-CM | POA: Insufficient documentation

## 2022-03-04 DIAGNOSIS — W19XXXA Unspecified fall, initial encounter: Secondary | ICD-10-CM | POA: Diagnosis not present

## 2022-03-04 DIAGNOSIS — E237 Disorder of pituitary gland, unspecified: Secondary | ICD-10-CM | POA: Insufficient documentation

## 2022-03-04 DIAGNOSIS — M47812 Spondylosis without myelopathy or radiculopathy, cervical region: Secondary | ICD-10-CM | POA: Diagnosis not present

## 2022-03-04 MED ORDER — TETANUS-DIPHTH-ACELL PERTUSSIS 5-2.5-18.5 LF-MCG/0.5 IM SUSY
0.5000 mL | PREFILLED_SYRINGE | Freq: Once | INTRAMUSCULAR | Status: AC
  Administered 2022-03-04: 0.5 mL via INTRAMUSCULAR
  Filled 2022-03-04: qty 0.5

## 2022-03-04 MED ORDER — LIDOCAINE-EPINEPHRINE (PF) 2 %-1:200000 IJ SOLN
10.0000 mL | Freq: Once | INTRAMUSCULAR | Status: AC
  Administered 2022-03-04: 10 mL
  Filled 2022-03-04: qty 20

## 2022-03-04 NOTE — Discharge Instructions (Signed)
As we discussed, your CT scan shows an abnormal appearance of the pituitary gland and MRI is recommended for further evaluation of this area.  Your primary doctor can schedule this.  Staples should be removed in 1 week by your primary doctor or urgent care.  Return to the ED sooner with headache, behavior change, vomiting, not able to eat or drink or any other concerns.

## 2022-03-04 NOTE — ED Provider Notes (Signed)
[facility]-EMERGENCY DEPT Provider Note   CSN: [id number] Arrival date & time: 03/04/22  1024     History  Chief Complaint  Patient presents with   Erika Gates    Erika Gates is a 87 y.o. female.  Patient with history of CAD status post CABG, diabetes, obesity, hypertension presenting with fall and head injury.  States she was trying some new shoes with rubber soles when they became tangled on the carpet and she fell backward striking her head on an unknown surface.  Believes she hit the tile floor in the bathroom.  Denies losing consciousness.  Complains of laceration to her posterior scalp and headache.  Takes aspirin but no other blood thinners.  Denies any neck or back pain.  No chest pain or abdominal pain.  No preceding dizziness, lightheadedness, chest pain, shortness of breath, cough or fever. She has chronic issues with her knees and ankles which are unchanged today Recalls the fall entirely without loss of consciousness.  The history is provided by the patient and a relative.  Fall Associated symptoms include headaches. Pertinent negatives include no chest pain, no abdominal pain and no shortness of breath.       Home Medications Prior to Admission medications   Medication Sig Start Date End Date Taking? Authorizing Provider  acetaminophen (TYLENOL) 500 MG tablet Take 1,000 mg by mouth every 6 (six) hours as needed for mild pain.    [provider]  ALPRAZolam (XANAX) 0.5 MG tablet TAKE 1 TABLET BY MOUTH 3 TIMES A DAY AS NEEDED FOR NERVES Patient taking differently: Take 0.5 mg by mouth in the morning and at bedtime. 12/11/13   Michele Mcalpine, MD  amLODipine (NORVASC) 5 MG tablet Take 1 tablet (5 mg total) by mouth daily. 08/29/21   Lyn Records, MD  aspirin EC 81 MG tablet Take 81 mg by mouth daily. Swallow whole.    [provider]  Cholecalciferol (VITAMIN D) 1000 UNITS capsule Take 2,000 Units by mouth daily.     [provider]  cyclobenzaprine (FLEXERIL) 10 MG tablet Take 10 mg by mouth 2 (two) times daily as needed for muscle spasms.    [provider]  dapagliflozin propanediol (FARXIGA) 10 MG TABS tablet Take 1 tablet (10 mg total) by mouth daily before breakfast. 02/02/22   Lyn Records, MD  diclofenac sodium (VOLTAREN) 1 % GEL APPLY 1 GM TO AFFECTED AREA UP TO 3 TIMES A DAY AS NEEDED 05/17/17   Pollyann Savoy, MD  furosemide (LASIX) 20 MG tablet Take 1 tablet (20 mg total) by mouth daily. 07/18/21 07/18/22  Meredeth Ide, MD  gabapentin (NEURONTIN) 100 MG capsule Take 1 capsule (100 mg total) by mouth at bedtime. Patient taking differently: Take 100 mg by mouth daily. 03/17/18   Gearldine Bienenstock, PA-C  gabapentin (NEURONTIN) 300 MG capsule Take 300 mg by mouth at bedtime. 05/24/21   [provider]  glimepiride (AMARYL) 2 MG tablet Take 2 mg by mouth daily with breakfast. Pt takes 1/2 1 mg tablet once a day.    [provider]  lisinopril (ZESTRIL) 20 MG tablet Take 1 tablet (20 mg total) by mouth daily. 07/18/21 07/18/22  Meredeth Ide, MD  metoprolol succinate (TOPROL-XL) 100 MG 24 hr tablet Take 1 tablet (100 mg total) by mouth daily. 12/11/13   Michele Mcalpine, MD  Multiple Vitamins-Minerals (PROTEGRA CARDIO PO) Take 1 capsule by mouth daily.      [provider]  rosuvastatin (CRESTOR) 40 MG tablet Take 40 mg by mouth daily. 02/23/17   [provider]  vitamin E 100 UNIT capsule Take 100 Units by mouth daily.    [provider]      Allergies    Metformin    Review of Systems   Review of Systems  Constitutional:  Negative for activity change, appetite change and fever.  HENT:  Negative for congestion and rhinorrhea.   Respiratory:  Negative for cough, chest tightness and shortness of breath.   Cardiovascular:  Negative for chest pain.  Gastrointestinal:  Negative for abdominal pain, nausea and vomiting.  Genitourinary:  Negative for  dysuria.  Skin:  Positive for wound.  Neurological:  Positive for headaches. Negative for dizziness and weakness.   all other systems are negative except as noted in the HPI and PMH.    Physical Exam Updated Vital Signs BP (!) 156/71 (BP Location: Right Arm)   Pulse 60   Temp 97.9 F (36.6 C) (Oral)   Resp 18   SpO2 98%  Physical Exam Vitals and nursing note reviewed.  Constitutional:      General: She is not in acute distress.    Appearance: She is well-developed.  HENT:     Head: Normocephalic.     Comments: Hematoma to occiput with 1 cm laceration, hemostatic    Mouth/Throat:     Pharynx: No oropharyngeal exudate.  Eyes:     Conjunctiva/sclera: Conjunctivae normal.     Pupils: Pupils are equal, round, and reactive to light.  Neck:     Comments: No midline C spine tenderness Cardiovascular:     Rate and Rhythm: Normal rate and regular rhythm.     Heart sounds: Normal heart sounds. No murmur heard. Pulmonary:     Effort: Pulmonary effort is normal. No respiratory distress.     Breath sounds: Normal breath sounds.  Abdominal:     Palpations: Abdomen is soft.     Tenderness: There is no abdominal tenderness. There is no guarding or rebound.  Musculoskeletal:        General: No tenderness. Normal range of motion.     Cervical back: Normal range of motion and neck supple.     Comments: No T or L spine tenderness. FROM bilateral hips and knees without pain  Skin:    General: Skin is warm.     Capillary Refill: Capillary refill takes less than 2 seconds.  Neurological:     General: No focal deficit present.     Mental Status: She is alert and oriented to person, place, and time. Mental status is at baseline.     Cranial Nerves: No cranial nerve deficit.     Motor: No abnormal muscle tone.     Coordination: Coordination normal.     Comments:  5/5 strength throughout. CN 2-12 intact.Equal grip strength.   Psychiatric:        Behavior: Behavior normal.     ED Results  / Procedures / Treatments   Labs (all labs ordered are listed, but only abnormal results are displayed) Labs Reviewed - No data to display  EKG None  Radiology CT Head Wo Contrast  Result Date: 03/04/2022 CLINICAL DATA:  Head trauma, intracranial venous injury suspected; Neck trauma (Age >= 65y) EXAM: CT HEAD WITHOUT CONTRAST CT CERVICAL SPINE WITHOUT CONTRAST TECHNIQUE: Multidetector CT imaging of the head and cervical spine was performed following the standard protocol without intravenous contrast. Multiplanar CT image reconstructions of the  cervical spine were also generated. RADIATION DOSE REDUCTION: This exam was performed according to the departmental dose-optimization program which includes automated exposure control, adjustment of the mA and/or kV according to patient size and/or use of iterative reconstruction technique. COMPARISON:  None Available. FINDINGS: CT HEAD FINDINGS Brain: No evidence of acute infarction, acute hemorrhage, hydrocephalus, or extra-axial fluid collection. Prominent heterogeneous soft tissue within the right aspect of the sella with suprasellar extension (for example see series 6, image 25). Possible 7 mm ossified meningioma along the right frontal convexity (series 4, image 49; series 7, image 19) without significant mass effect or adjacent brain edema. Vascular: No hyperdense vessel identified. Skull: High left posterior scalp contusion/laceration without acute calvarial fracture. Sinuses/Orbits: Largely clear visualized sinuses. Other: No mastoid effusions. CT CERVICAL SPINE FINDINGS Beam hardening artifact from patient body habitus limits evaluation the mid and lower cervical spine. Within this limitation: Alignment: Reversal of the normal cervical lordosis. Mild anterolisthesis of C5 on C6, probably degenerative given facet arthropathy at this level. Skull base and vertebrae: Vertebral body heights are maintained. Soft tissues and spinal canal: No prevertebral fluid  or swelling. No visible canal hematoma. Disc levels: Severe multilevel degenerative disease including disc height loss, endplate sclerosis/irregularity and endplate spurring. Multilevel facet and uncovertebral hypertrophy with varying degrees of neural foraminal stenosis. Upper chest: Visualized lung apices are clear. IMPRESSION: CT head: 1. No evidence of acute traumatic intracranial abnormality. 2. High left posterior scalp contusion/laceration without acute calvarial fracture. 3. Prominent heterogeneous soft tissue within the right aspect of the sella with suprasellar extension, possibly representing underlying pituitary mass. Recommend follow-up pituitary protocol MRI with contrast and correlation with pituitary function labs. 4. Possible 7 mm ossified meningioma along the right frontal convexity without significant mass effect or adjacent brain edema. CT cervical spine: 1. No evidence of acute fracture or traumatic malalignment. 2. Severe multilevel degenerative change. Electronically Signed   By: Feliberto Harts M.D.   On: 03/04/2022 13:23   CT Cervical Spine Wo Contrast  Result Date: 03/04/2022 CLINICAL DATA:  Head trauma, intracranial venous injury suspected; Neck trauma (Age >= 65y) EXAM: CT HEAD WITHOUT CONTRAST CT CERVICAL SPINE WITHOUT CONTRAST TECHNIQUE: Multidetector CT imaging of the head and cervical spine was performed following the standard protocol without intravenous contrast. Multiplanar CT image reconstructions of the cervical spine were also generated. RADIATION DOSE REDUCTION: This exam was performed according to the departmental dose-optimization program which includes automated exposure control, adjustment of the mA and/or kV according to patient size and/or use of iterative reconstruction technique. COMPARISON:  None Available. FINDINGS: CT HEAD FINDINGS Brain: No evidence of acute infarction, acute hemorrhage, hydrocephalus, or extra-axial fluid collection. Prominent heterogeneous  soft tissue within the right aspect of the sella with suprasellar extension (for example see series 6, image 25). Possible 7 mm ossified meningioma along the right frontal convexity (series 4, image 49; series 7, image 19) without significant mass effect or adjacent brain edema. Vascular: No hyperdense vessel identified. Skull: High left posterior scalp contusion/laceration without acute calvarial fracture. Sinuses/Orbits: Largely clear visualized sinuses. Other: No mastoid effusions. CT CERVICAL SPINE FINDINGS Beam hardening artifact from patient body habitus limits evaluation the mid and lower cervical spine. Within this limitation: Alignment: Reversal of the normal cervical lordosis. Mild anterolisthesis of C5 on C6, probably degenerative given facet arthropathy at this level. Skull base and vertebrae: Vertebral body heights are maintained. Soft tissues and spinal canal: No prevertebral fluid or swelling. No visible canal hematoma. Disc levels: Severe multilevel degenerative disease  including disc height loss, endplate sclerosis/irregularity and endplate spurring. Multilevel facet and uncovertebral hypertrophy with varying degrees of neural foraminal stenosis. Upper chest: Visualized lung apices are clear. IMPRESSION: CT head: 1. No evidence of acute traumatic intracranial abnormality. 2. High left posterior scalp contusion/laceration without acute calvarial fracture. 3. Prominent heterogeneous soft tissue within the right aspect of the sella with suprasellar extension, possibly representing underlying pituitary mass. Recommend follow-up pituitary protocol MRI with contrast and correlation with pituitary function labs. 4. Possible 7 mm ossified meningioma along the right frontal convexity without significant mass effect or adjacent brain edema. CT cervical spine: 1. No evidence of acute fracture or traumatic malalignment. 2. Severe multilevel degenerative change. Electronically Signed   By: Feliberto Harts M.D.    On: 03/04/2022 13:23    Procedures .[name] KitchenLaceration Repair  Date/Time: 03/04/2022 1:45 PM  Performed by: Glynn Octave, MD Authorized by: Glynn Octave, MD   Consent:    Consent obtained:  Verbal   Consent given by:  Patient   Risks, benefits, and alternatives were discussed: yes     Risks discussed:  Need for additional repair, infection, nerve damage, poor wound healing, poor cosmetic result, pain, retained foreign body, tendon damage and vascular damage   Alternatives discussed:  No treatment Universal protocol:    Procedure explained and questions answered to patient or proxy's satisfaction: yes     Relevant documents present and verified: yes     Test results available: yes     Imaging studies available: yes     Required blood products, implants, devices, and special equipment available: yes     Site/side marked: yes     Immediately prior to procedure, a time out was called: yes     Patient identity confirmed:  Verbally with patient Anesthesia:    Anesthesia method:  Local infiltration   Local anesthetic:  Lidocaine 1% WITH epi Laceration details:    Location:  Scalp   Scalp location:  Occipital   Length (cm):  1 Pre-procedure details:    Preparation:  Patient was prepped and draped in usual sterile fashion and imaging obtained to evaluate for foreign bodies Exploration:    Limited defect created (wound extended): no     Hemostasis achieved with:  Epinephrine   Imaging outcome: foreign body not noted     Wound exploration: wound explored through full range of motion and entire depth of wound visualized     Wound extent: no fascia violation noted, no underlying fracture noted and no vascular damage noted   Treatment:    Area cleansed with:  Povidone-iodine   Amount of cleaning:  Standard   Irrigation solution:  Sterile saline   Irrigation method:  Pressure wash Skin repair:    Repair method:  Staples   Number of staples:  2 Approximation:    Approximation:   Close Repair type:    Repair type:  Simple Post-procedure details:    Dressing:  Antibiotic ointment and adhesive bandage   Procedure completion:  Tolerated     Medications Ordered in ED Medications  Tdap (BOOSTRIX) injection 0.5 mL (has no administration in time range)  lidocaine-EPINEPHrine (XYLOCAINE W/EPI) 2 %-1:200000 (PF) injection 10 mL (has no administration in time range)    ED Course/ Medical Decision Making/ A&P                           Medical Decision Making Amount and/or Complexity of Data Reviewed Labs: ordered. Decision-making details  documented in ED Course. Radiology: ordered and independent interpretation performed. Decision-making details documented in ED Course. ECG/medicine tests: ordered and independent interpretation performed. Decision-making details documented in ED Course.  Risk Prescription drug management.  Likely mechanical fall with head injury.  No loss of consciousness.  Sustained laceration which is hemostatic.  Will obtain imaging as well as repair laceration.  No preceding dizziness or lightheadedness.  No chest pain or shortness of breath.  Tetanus needs updating  Laceration repaired as above.  Tetanus updated.  CT scan results reviewed and interpreted by me.  Discussed with patient and daughter.  No evidence of traumatic injury.  There is a large heterogeneous appearance of the pituitary that needs further evaluation by MRI.  Not emergent for Dr. Yetta BarreJones of radiology. Patient able to ambulate appears to be at baseline  Findings discussed with patient and daughter.  Follow-up with PCP for suture and staple removal in 1 week.  Also follow-up with PCP regarding abnormal CT findings and need for pituitary findings and need for MRI.  Return precautions discussed.         Final Clinical Impression(s) / ED Diagnoses Final diagnoses:  Fall, initial encounter  Laceration of scalp, initial encounter  Abnormality of pituitary gland [facility](HCC)    Rx  / DC Orders ED Discharge Orders     None         Adilyn Humes, Jeannett SeniorStephen, MD 03/04/22 1527

## 2022-03-04 NOTE — ED Provider Triage Note (Signed)
Emergency Medicine Provider Triage Evaluation Note  Erika Gates , a 87 y.o. female  was evaluated in triage.  Pt complains of a laceration to the head after a fall.  Patient states she was walking at home with her walker when her legs got twisted and she had a mechanical fall.  She hit the back of her head on tile at the floor level.  She denies loss of consciousness.  Denies severe neck pain at this time.  Complains of pain in the occiput region where she hit the tile.  Small, 1 cm laceration noted in the area with bleeding controlled at this time.  The patient does not take blood thinners.  Review of Systems  Positive: Head injury, laceration Negative: Loss of Consciousness  Physical Exam  BP (!) 156/71 (BP Location: Right Arm)   Pulse 60   Temp 97.9 F (36.6 C) (Oral)   Resp 18   SpO2 98%  Gen:   Awake, no distress   Resp:  Normal effort  MSK:   Moves extremities without difficulty  Other:    Medical Decision Making  Medically screening exam initiated at 10:37 AM.  Appropriate orders placed.  [name] was informed that the remainder of the evaluation will be completed by another provider, this initial triage assessment does not replace that evaluation, and the importance of remaining in the ED until their evaluation is complete.     Darrick Grinder, PA-C 03/04/22 1041

## 2022-03-04 NOTE — ED Notes (Addendum)
Patient ambulated with walker and x1 staff assistance, did not report dizziness, unsteadiness, or any other issues. Patient stated that she feels like she is at her baseline while ambulating.

## 2022-03-04 NOTE — ED Triage Notes (Signed)
Pt arrives via [facility] from home for mechanical fall from standing. Pt reports hitting her head on the tile floor. No LOC, blood thinner use. EMS reports laceration to occiput. Dried blood noted during triage.

## 2022-03-13 DIAGNOSIS — R2681 Unsteadiness on feet: Secondary | ICD-10-CM | POA: Diagnosis not present

## 2022-03-13 DIAGNOSIS — S0101XD Laceration without foreign body of scalp, subsequent encounter: Secondary | ICD-10-CM | POA: Diagnosis not present

## 2022-03-13 DIAGNOSIS — D497 Neoplasm of unspecified behavior of endocrine glands and other parts of nervous system: Secondary | ICD-10-CM | POA: Diagnosis not present

## 2022-03-13 DIAGNOSIS — Z9181 History of falling: Secondary | ICD-10-CM | POA: Diagnosis not present

## 2022-07-01 IMAGING — DX DG CHEST 1V PORT
1 series · 1 of 1 positions shown · non-contrast
Comparison: PA Lat 10/01/2011.

CLINICAL DATA: Shortness of breath.

EXAM:
PORTABLE CHEST 1 VIEW

[chest]
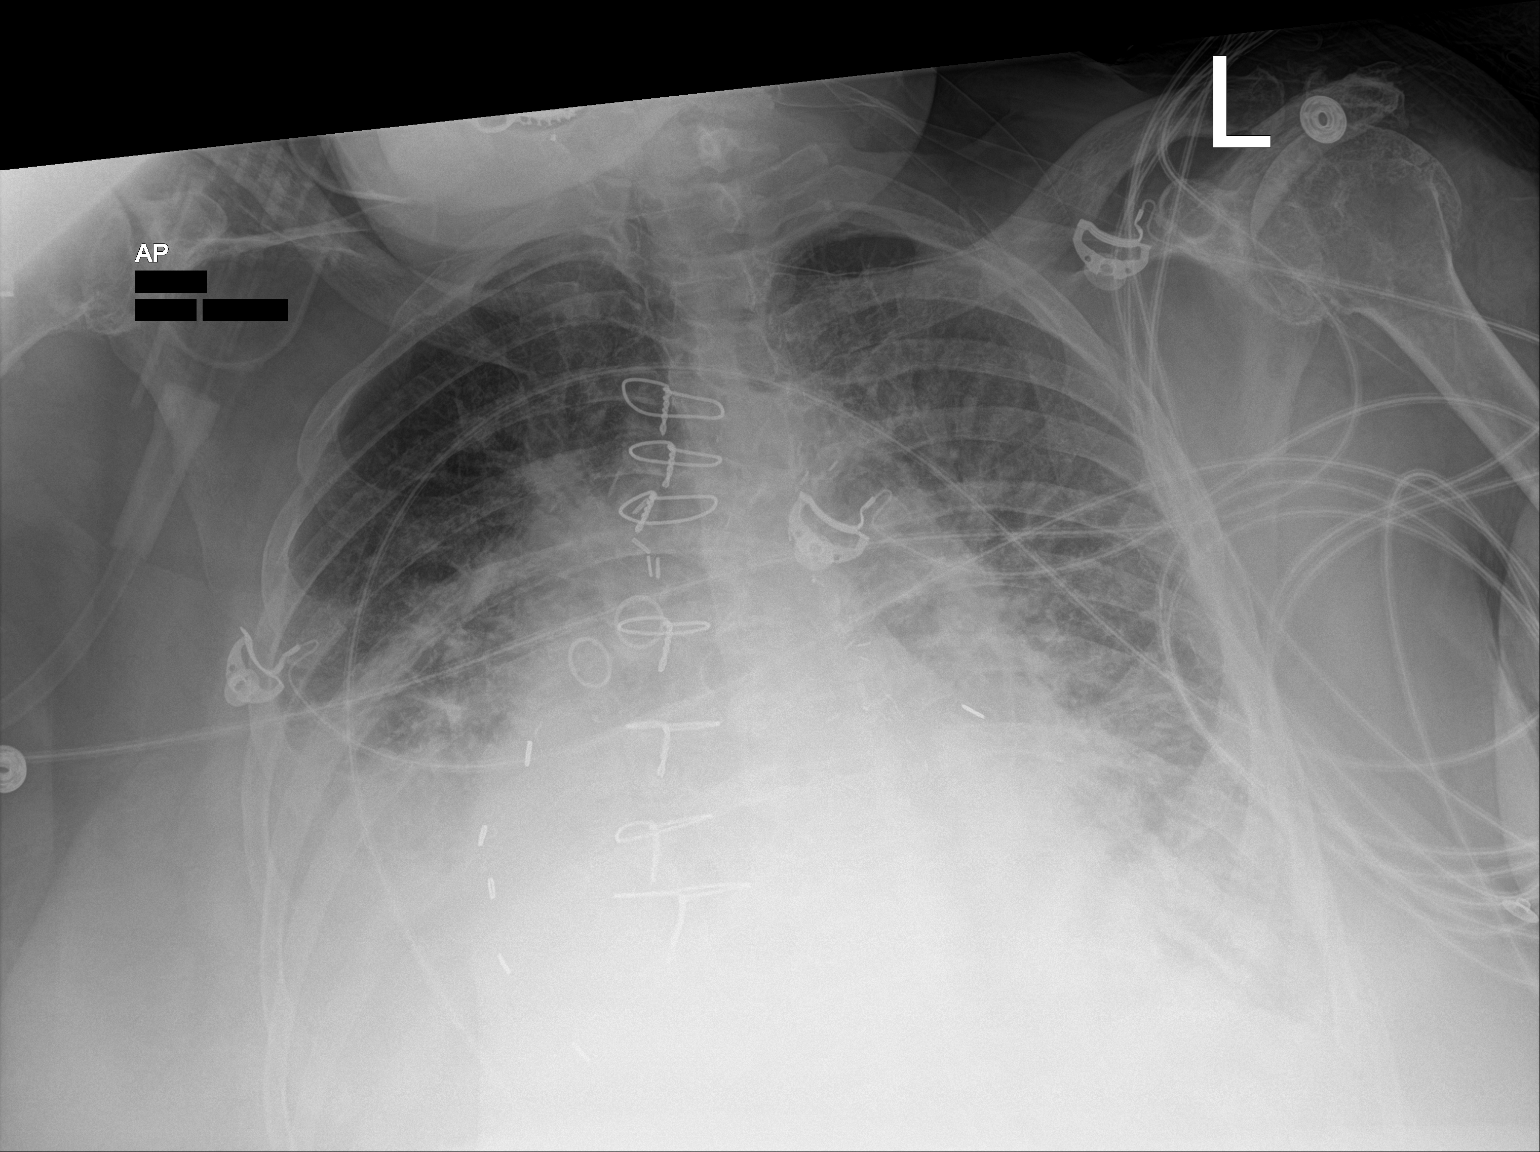

[1 of 1 positions shown; findings below may reference images not displayed]

FINDINGS: Old sternotomy and CABG change is again noted. The heart is
enlarged. There is worsening perihilar vascular congestion and
moderate interstitial edema. Alveolar infiltrates radiate into the
mid and lower lung fields consistent with alveolar edema with
underlying pneumonia difficult to exclude in the proper clinical
setting.

There are moderate right-greater-than-left pleural effusions, with
overlying consolidation or atelectasis in the lower lung fields.
IMPRESSION: Findings of CHF or fluid overload with pulmonary edema and moderate
right-greater-than-left pleural effusions. Underlying pneumonia
would be difficult to exclude in the proper clinical setting.
Clinical correlation and radiographic follow-up recommended.

## 2022-07-02 IMAGING — DX DG CHEST 1V PORT
1 series · 1 of 1 positions shown · non-contrast
Comparison: 07/14/2021.

CLINICAL DATA: Respiratory failure.

EXAM:
PORTABLE CHEST 1 VIEW

[chest ap]
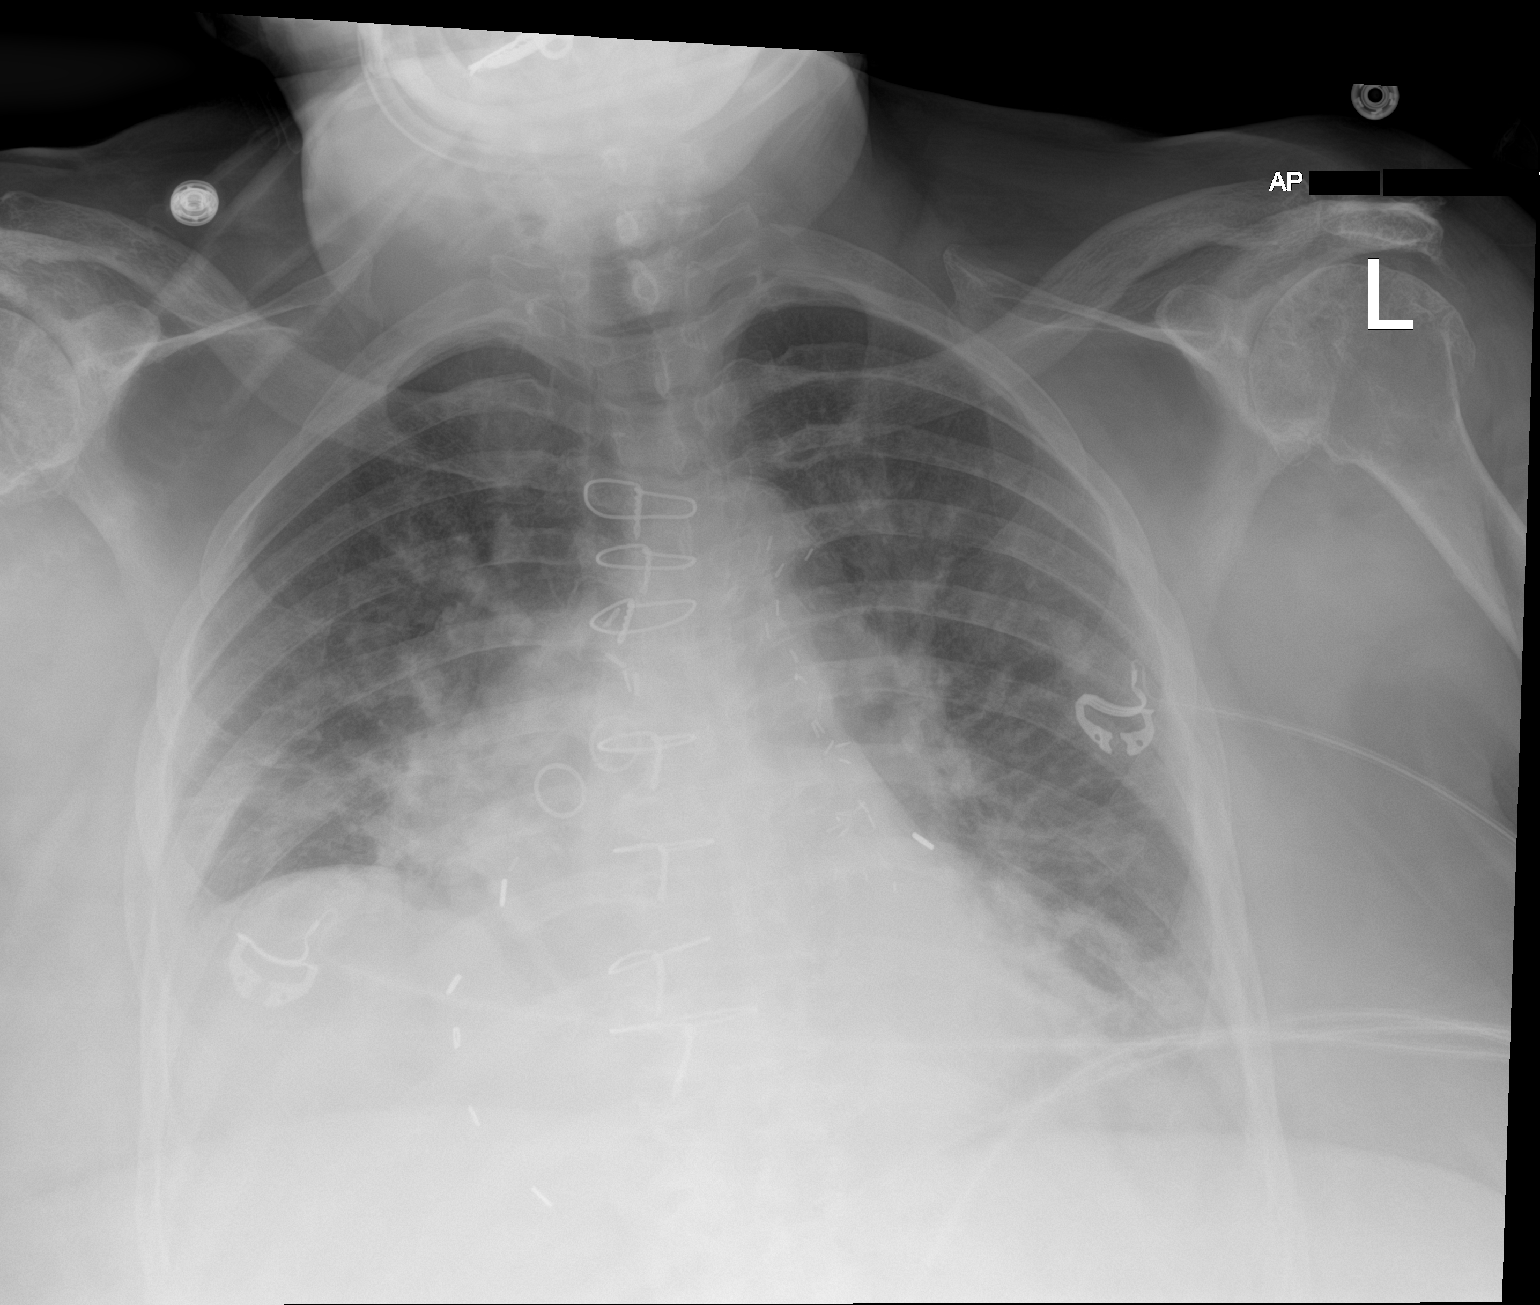

[1 of 1 positions shown; findings below may reference images not displayed]

FINDINGS: Prior CABG. Cardiomegaly again noted. Bilateral pulmonary
infiltrates/edema again noted. Slight improvement from prior exam.
Small bilateral pleural effusions can not be excluded. No
pneumothorax.
IMPRESSION: Prior CABG. Cardiomegaly again noted. Bilateral pulmonary
infiltrates/edema again noted. Slight improvement from prior exam.
Small bilateral pleural effusions cannot be excluded.

## 2022-07-22 DIAGNOSIS — M79606 Pain in leg, unspecified: Secondary | ICD-10-CM | POA: Diagnosis not present

## 2022-07-22 DIAGNOSIS — E785 Hyperlipidemia, unspecified: Secondary | ICD-10-CM | POA: Diagnosis not present

## 2022-07-22 DIAGNOSIS — N1832 Chronic kidney disease, stage 3b: Secondary | ICD-10-CM | POA: Diagnosis not present

## 2022-07-22 DIAGNOSIS — M25519 Pain in unspecified shoulder: Secondary | ICD-10-CM | POA: Diagnosis not present

## 2022-07-22 DIAGNOSIS — I10 Essential (primary) hypertension: Secondary | ICD-10-CM | POA: Diagnosis not present

## 2022-07-22 DIAGNOSIS — E1122 Type 2 diabetes mellitus with diabetic chronic kidney disease: Secondary | ICD-10-CM | POA: Diagnosis not present

## 2022-07-22 DIAGNOSIS — I5032 Chronic diastolic (congestive) heart failure: Secondary | ICD-10-CM | POA: Diagnosis not present

## 2022-07-22 DIAGNOSIS — F411 Generalized anxiety disorder: Secondary | ICD-10-CM | POA: Diagnosis not present

## 2022-07-22 DIAGNOSIS — J9611 Chronic respiratory failure with hypoxia: Secondary | ICD-10-CM | POA: Diagnosis not present

## 2022-07-27 DIAGNOSIS — I5032 Chronic diastolic (congestive) heart failure: Secondary | ICD-10-CM | POA: Diagnosis not present

## 2022-07-27 DIAGNOSIS — E1122 Type 2 diabetes mellitus with diabetic chronic kidney disease: Secondary | ICD-10-CM | POA: Diagnosis not present

## 2022-07-27 DIAGNOSIS — G8929 Other chronic pain: Secondary | ICD-10-CM | POA: Diagnosis not present

## 2022-07-27 DIAGNOSIS — M19012 Primary osteoarthritis, left shoulder: Secondary | ICD-10-CM | POA: Diagnosis not present

## 2022-07-27 DIAGNOSIS — M519 Unspecified thoracic, thoracolumbar and lumbosacral intervertebral disc disorder: Secondary | ICD-10-CM | POA: Diagnosis not present

## 2022-07-27 DIAGNOSIS — M170 Bilateral primary osteoarthritis of knee: Secondary | ICD-10-CM | POA: Diagnosis not present

## 2022-07-27 DIAGNOSIS — I130 Hypertensive heart and chronic kidney disease with heart failure and stage 1 through stage 4 chronic kidney disease, or unspecified chronic kidney disease: Secondary | ICD-10-CM | POA: Diagnosis not present

## 2022-07-27 DIAGNOSIS — N1832 Chronic kidney disease, stage 3b: Secondary | ICD-10-CM | POA: Diagnosis not present

## 2022-07-27 DIAGNOSIS — M19011 Primary osteoarthritis, right shoulder: Secondary | ICD-10-CM | POA: Diagnosis not present

## 2022-08-10 ENCOUNTER — Emergency Department (HOSPITAL_COMMUNITY): Payer: Medicare PPO

## 2022-08-10 ENCOUNTER — Inpatient Hospital Stay (HOSPITAL_COMMUNITY)
Admission: EM | Admit: 2022-08-10 | Discharge: 2022-08-27 | DRG: 471 | Disposition: A | Discharge status: Rehabilitation Facility | Payer: Medicare PPO | Attending: Internal Medicine | Admitting: Internal Medicine

## 2022-08-10 ENCOUNTER — Observation Stay (HOSPITAL_COMMUNITY): Payer: Medicare PPO

## 2022-08-10 ENCOUNTER — Other Ambulatory Visit: Payer: Self-pay

## 2022-08-10 DIAGNOSIS — I468 Cardiac arrest due to other underlying condition: Secondary | ICD-10-CM | POA: Diagnosis not present

## 2022-08-10 DIAGNOSIS — J986 Disorders of diaphragm: Secondary | ICD-10-CM | POA: Diagnosis not present

## 2022-08-10 DIAGNOSIS — E860 Dehydration: Secondary | ICD-10-CM | POA: Diagnosis not present

## 2022-08-10 DIAGNOSIS — G8929 Other chronic pain: Secondary | ICD-10-CM | POA: Diagnosis present

## 2022-08-10 DIAGNOSIS — E11649 Type 2 diabetes mellitus with hypoglycemia without coma: Secondary | ICD-10-CM | POA: Diagnosis not present

## 2022-08-10 DIAGNOSIS — G8250 Quadriplegia, unspecified: Secondary | ICD-10-CM | POA: Diagnosis present

## 2022-08-10 DIAGNOSIS — M19012 Primary osteoarthritis, left shoulder: Secondary | ICD-10-CM | POA: Diagnosis not present

## 2022-08-10 DIAGNOSIS — R29898 Other symptoms and signs involving the musculoskeletal system: Secondary | ICD-10-CM | POA: Diagnosis not present

## 2022-08-10 DIAGNOSIS — I10 Essential (primary) hypertension: Secondary | ICD-10-CM | POA: Diagnosis not present

## 2022-08-10 DIAGNOSIS — R531 Weakness: Principal | ICD-10-CM | POA: Insufficient documentation

## 2022-08-10 DIAGNOSIS — M4712 Other spondylosis with myelopathy, cervical region: Secondary | ICD-10-CM | POA: Diagnosis not present

## 2022-08-10 DIAGNOSIS — Z4682 Encounter for fitting and adjustment of non-vascular catheter: Secondary | ICD-10-CM | POA: Diagnosis not present

## 2022-08-10 DIAGNOSIS — N179 Acute kidney failure, unspecified: Secondary | ICD-10-CM | POA: Diagnosis not present

## 2022-08-10 DIAGNOSIS — L89316 Pressure-induced deep tissue damage of right buttock: Secondary | ICD-10-CM | POA: Diagnosis not present

## 2022-08-10 DIAGNOSIS — I2583 Coronary atherosclerosis due to lipid rich plaque: Secondary | ICD-10-CM | POA: Diagnosis not present

## 2022-08-10 DIAGNOSIS — Z90710 Acquired absence of both cervix and uterus: Secondary | ICD-10-CM

## 2022-08-10 DIAGNOSIS — R0603 Acute respiratory distress: Secondary | ICD-10-CM | POA: Diagnosis not present

## 2022-08-10 DIAGNOSIS — M4319 Spondylolisthesis, multiple sites in spine: Secondary | ICD-10-CM | POA: Diagnosis not present

## 2022-08-10 DIAGNOSIS — F419 Anxiety disorder, unspecified: Secondary | ICD-10-CM

## 2022-08-10 DIAGNOSIS — D649 Anemia, unspecified: Secondary | ICD-10-CM | POA: Diagnosis present

## 2022-08-10 DIAGNOSIS — L89156 Pressure-induced deep tissue damage of sacral region: Secondary | ICD-10-CM | POA: Diagnosis not present

## 2022-08-10 DIAGNOSIS — E872 Acidosis, unspecified: Secondary | ICD-10-CM | POA: Diagnosis not present

## 2022-08-10 DIAGNOSIS — I4510 Unspecified right bundle-branch block: Secondary | ICD-10-CM | POA: Diagnosis not present

## 2022-08-10 DIAGNOSIS — Z79899 Other long term (current) drug therapy: Secondary | ICD-10-CM

## 2022-08-10 DIAGNOSIS — I2510 Atherosclerotic heart disease of native coronary artery without angina pectoris: Secondary | ICD-10-CM | POA: Diagnosis not present

## 2022-08-10 DIAGNOSIS — M25512 Pain in left shoulder: Secondary | ICD-10-CM | POA: Diagnosis present

## 2022-08-10 DIAGNOSIS — I110 Hypertensive heart disease with heart failure: Secondary | ICD-10-CM | POA: Diagnosis present

## 2022-08-10 DIAGNOSIS — E875 Hyperkalemia: Secondary | ICD-10-CM | POA: Diagnosis not present

## 2022-08-10 DIAGNOSIS — Z1152 Encounter for screening for COVID-19: Secondary | ICD-10-CM | POA: Diagnosis not present

## 2022-08-10 DIAGNOSIS — I509 Heart failure, unspecified: Secondary | ICD-10-CM | POA: Diagnosis not present

## 2022-08-10 DIAGNOSIS — Z7401 Bed confinement status: Secondary | ICD-10-CM

## 2022-08-10 DIAGNOSIS — Z7901 Long term (current) use of anticoagulants: Secondary | ICD-10-CM

## 2022-08-10 DIAGNOSIS — M170 Bilateral primary osteoarthritis of knee: Secondary | ICD-10-CM | POA: Diagnosis present

## 2022-08-10 DIAGNOSIS — M25561 Pain in right knee: Secondary | ICD-10-CM | POA: Diagnosis present

## 2022-08-10 DIAGNOSIS — I4819 Other persistent atrial fibrillation: Secondary | ICD-10-CM | POA: Diagnosis not present

## 2022-08-10 DIAGNOSIS — Z981 Arthrodesis status: Secondary | ICD-10-CM | POA: Diagnosis not present

## 2022-08-10 DIAGNOSIS — E876 Hypokalemia: Secondary | ICD-10-CM | POA: Diagnosis not present

## 2022-08-10 DIAGNOSIS — D696 Thrombocytopenia, unspecified: Secondary | ICD-10-CM | POA: Diagnosis not present

## 2022-08-10 DIAGNOSIS — M4802 Spinal stenosis, cervical region: Secondary | ICD-10-CM | POA: Diagnosis present

## 2022-08-10 DIAGNOSIS — Z888 Allergy status to other drugs, medicaments and biological substances status: Secondary | ICD-10-CM

## 2022-08-10 DIAGNOSIS — M5001 Cervical disc disorder with myelopathy,  high cervical region: Secondary | ICD-10-CM | POA: Diagnosis present

## 2022-08-10 DIAGNOSIS — I5032 Chronic diastolic (congestive) heart failure: Secondary | ICD-10-CM | POA: Diagnosis present

## 2022-08-10 DIAGNOSIS — Z7982 Long term (current) use of aspirin: Secondary | ICD-10-CM

## 2022-08-10 DIAGNOSIS — M1711 Unilateral primary osteoarthritis, right knee: Secondary | ICD-10-CM | POA: Diagnosis not present

## 2022-08-10 DIAGNOSIS — M25511 Pain in right shoulder: Secondary | ICD-10-CM | POA: Diagnosis present

## 2022-08-10 DIAGNOSIS — Z6839 Body mass index (BMI) 39.0-39.9, adult: Secondary | ICD-10-CM

## 2022-08-10 DIAGNOSIS — I272 Pulmonary hypertension, unspecified: Secondary | ICD-10-CM | POA: Diagnosis present

## 2022-08-10 DIAGNOSIS — D6869 Other thrombophilia: Secondary | ICD-10-CM | POA: Diagnosis present

## 2022-08-10 DIAGNOSIS — Z8679 Personal history of other diseases of the circulatory system: Secondary | ICD-10-CM

## 2022-08-10 DIAGNOSIS — L89326 Pressure-induced deep tissue damage of left buttock: Secondary | ICD-10-CM | POA: Diagnosis not present

## 2022-08-10 DIAGNOSIS — I469 Cardiac arrest, cause unspecified: Secondary | ICD-10-CM | POA: Diagnosis not present

## 2022-08-10 DIAGNOSIS — M25562 Pain in left knee: Secondary | ICD-10-CM | POA: Diagnosis present

## 2022-08-10 DIAGNOSIS — Z951 Presence of aortocoronary bypass graft: Secondary | ICD-10-CM

## 2022-08-10 DIAGNOSIS — I517 Cardiomegaly: Secondary | ICD-10-CM | POA: Diagnosis not present

## 2022-08-10 DIAGNOSIS — E7800 Pure hypercholesterolemia, unspecified: Secondary | ICD-10-CM | POA: Diagnosis present

## 2022-08-10 DIAGNOSIS — R7989 Other specified abnormal findings of blood chemistry: Secondary | ICD-10-CM | POA: Diagnosis not present

## 2022-08-10 DIAGNOSIS — M25519 Pain in unspecified shoulder: Secondary | ICD-10-CM | POA: Diagnosis not present

## 2022-08-10 DIAGNOSIS — R4182 Altered mental status, unspecified: Secondary | ICD-10-CM | POA: Diagnosis not present

## 2022-08-10 DIAGNOSIS — I4891 Unspecified atrial fibrillation: Secondary | ICD-10-CM | POA: Diagnosis not present

## 2022-08-10 DIAGNOSIS — M4322 Fusion of spine, cervical region: Secondary | ICD-10-CM | POA: Diagnosis not present

## 2022-08-10 DIAGNOSIS — Z7984 Long term (current) use of oral hypoglycemic drugs: Secondary | ICD-10-CM

## 2022-08-10 DIAGNOSIS — G992 Myelopathy in diseases classified elsewhere: Secondary | ICD-10-CM | POA: Diagnosis not present

## 2022-08-10 DIAGNOSIS — J984 Other disorders of lung: Secondary | ICD-10-CM | POA: Diagnosis not present

## 2022-08-10 DIAGNOSIS — Z9181 History of falling: Secondary | ICD-10-CM

## 2022-08-10 DIAGNOSIS — T50915A Adverse effect of multiple unspecified drugs, medicaments and biological substances, initial encounter: Secondary | ICD-10-CM | POA: Diagnosis not present

## 2022-08-10 DIAGNOSIS — M50221 Other cervical disc displacement at C4-C5 level: Secondary | ICD-10-CM | POA: Diagnosis not present

## 2022-08-10 DIAGNOSIS — M2578 Osteophyte, vertebrae: Secondary | ICD-10-CM | POA: Diagnosis not present

## 2022-08-10 DIAGNOSIS — M1712 Unilateral primary osteoarthritis, left knee: Secondary | ICD-10-CM | POA: Diagnosis not present

## 2022-08-10 DIAGNOSIS — Z8249 Family history of ischemic heart disease and other diseases of the circulatory system: Secondary | ICD-10-CM

## 2022-08-10 LAB — CBC WITH DIFFERENTIAL/PLATELET
Abs Immature Granulocytes: 0.02 10*3/uL (ref 0.00–0.07)
Basophils Absolute: 0 10*3/uL (ref 0.0–0.1)
Basophils Relative: 1 %
Eosinophils Absolute: 0.2 10*3/uL (ref 0.0–0.5)
Eosinophils Relative: 3 %
HCT: 38.7 % (ref 36.0–46.0)
Hemoglobin: 11.2 g/dL — ABNORMAL LOW (ref 12.0–15.0)
Immature Granulocytes: 0 %
Lymphocytes Relative: 28 %
Lymphs Abs: 1.3 10*3/uL (ref 0.7–4.0)
MCH: 27.7 pg (ref 26.0–34.0)
MCHC: 28.9 g/dL — ABNORMAL LOW (ref 30.0–36.0)
MCV: 95.8 fL (ref 80.0–100.0)
Monocytes Absolute: 0.5 10*3/uL (ref 0.1–1.0)
Monocytes Relative: 10 %
Neutro Abs: 2.8 10*3/uL (ref 1.7–7.7)
Neutrophils Relative %: 58 %
Platelets: 119 10*3/uL — ABNORMAL LOW (ref 150–400)
RBC: 4.04 MIL/uL (ref 3.87–5.11)
RDW: 16.2 % — ABNORMAL HIGH (ref 11.5–15.5)
WBC: 4.9 10*3/uL (ref 4.0–10.5)
nRBC: 0 % (ref 0.0–0.2)

## 2022-08-10 LAB — COMPREHENSIVE METABOLIC PANEL
ALT: 24 U/L (ref 0–44)
AST: 30 U/L (ref 15–41)
Albumin: 3.2 g/dL — ABNORMAL LOW (ref 3.5–5.0)
Alkaline Phosphatase: 81 U/L (ref 38–126)
Anion gap: 8 (ref 5–15)
BUN: 29 mg/dL — ABNORMAL HIGH (ref 8–23)
CO2: 26 mmol/L (ref 22–32)
Calcium: 10 mg/dL (ref 8.9–10.3)
Chloride: 111 mmol/L (ref 98–111)
Creatinine, Ser: 0.9 mg/dL (ref 0.44–1.00)
GFR, Estimated: >60 mL/min (ref >60)
Glucose, Bld: 82 mg/dL (ref 70–99)
Potassium: 4.7 mmol/L (ref 3.5–5.1)
Sodium: 145 mmol/L (ref 135–145)
Total Bilirubin: 0.5 mg/dL (ref 0.3–1.2)
Total Protein: 6.6 g/dL (ref 6.5–8.1)

## 2022-08-10 LAB — RESP PANEL BY RT-PCR (RSV, FLU A&B, COVID)  RVPGX2
Influenza A by PCR: NEGATIVE
Influenza B by PCR: NEGATIVE
Resp Syncytial Virus by PCR: NEGATIVE
SARS Coronavirus 2 by RT PCR: NEGATIVE

## 2022-08-10 LAB — CK: Total CK: 88 U/L (ref 38–234)

## 2022-08-10 LAB — URINALYSIS, ROUTINE W REFLEX MICROSCOPIC
Bacteria, UA: NONE SEEN
Bilirubin Urine: NEGATIVE
Glucose, UA: >=500 mg/dL — AB
Hgb urine dipstick: NEGATIVE
Ketones, ur: NEGATIVE mg/dL
Leukocytes,Ua: NEGATIVE
Nitrite: NEGATIVE
Protein, ur: 30 mg/dL — AB
Specific Gravity, Urine: 1.017 (ref 1.005–1.030)
pH: 5 (ref 5.0–8.0)

## 2022-08-10 LAB — SEDIMENTATION RATE: Sed Rate: 49 mm/hr — ABNORMAL HIGH (ref 0–22)

## 2022-08-10 LAB — TROPONIN I (HIGH SENSITIVITY)
Troponin I (High Sensitivity): 11 ng/L (ref <18)
Troponin I (High Sensitivity): 11 ng/L (ref <18)

## 2022-08-10 LAB — LACTIC ACID, PLASMA: Lactic Acid, Venous: 0.9 mmol/L (ref 0.5–1.9)

## 2022-08-10 LAB — TSH: TSH: 1.783 u[IU]/mL (ref 0.350–4.500)

## 2022-08-10 MED ORDER — LORAZEPAM 1 MG PO TABS
0.5000 mg | ORAL_TABLET | Freq: Once | ORAL | Status: AC
  Administered 2022-08-10: 0.5 mg via ORAL
  Filled 2022-08-10: qty 1

## 2022-08-10 MED ORDER — ENOXAPARIN SODIUM 40 MG/0.4ML IJ SOSY
40.0000 mg | PREFILLED_SYRINGE | INTRAMUSCULAR | Status: DC
  Administered 2022-08-11 – 2022-08-12 (×2): 40 mg via SUBCUTANEOUS
  Filled 2022-08-10 (×3): qty 0.4

## 2022-08-10 NOTE — Assessment & Plan Note (Signed)
-  elevated. Continue home antihypertensives

## 2022-08-10 NOTE — Assessment & Plan Note (Addendum)
-  presents with progressive bilateral upper extremity weakness with decrease passive ROM over the past several weeks with worsening stiffness in the morning. Based on left shoulder X-ray imaging she likely has chronic rotator cuff defect but does not explain concomitant worsening of her right UE. Symptoms concerning for cervical pathology vs PMR. No other neurological findings to suggest Giant cell arteritis.  -Doubt related to statin since she is not complaining of myalgia but mostly weakness -CPR, CK and TSH are normal. ESR is mildly elevated at 49.  -will check rheumatoid factor and anti-CCP to rule out other rheumatic disease -she had CT cervical spine in July revealing severe multilevel degenerative change. Will obtain MRI C-spine wo contrast.  -PT evaluation

## 2022-08-10 NOTE — ED Provider Triage Note (Signed)
  Emergency Medicine Provider Triage Evaluation Note  MRN:  [id number]  Arrival date & time: 08/10/22    Medically screening exam initiated at 6:18 AM.   CC:   Gen. Weakness/Pain at Shoulders and Knee   HPI:  Erika Gates is a 87 y.o. year-old female presents to the ED with chief complaint of left shoulder pain and bilateral knee pain for the past 1 week.  She denies recent illnesses.  Denies any injuries.  History provided by patient. ROS:  -As included in HPI PE:   Vitals:   08/10/22 0617  BP: (!) 120/55  Pulse: (!) 56  Resp: 17  Temp: 97.9 F (36.6 C)  SpO2: 98%    Non-toxic appearing No respiratory distress  MDM:  Based on signs and symptoms, arthritis is highest on my differential. I've ordered x-rays in triage to expedite lab/diagnostic workup.  Patient was informed that the remainder of the evaluation will be completed by another provider, this initial triage assessment does not replace that evaluation, and the importance of remaining in the ED until their evaluation is complete.    Roxy Horseman, PA-C 08/10/22 [phone]

## 2022-08-10 NOTE — ED Provider Notes (Signed)
[facility] EMERGENCY DEPARTMENT Provider Note   CSN: [id number] Arrival date & time: 08/10/22  [id number]     History  Chief Complaint  Patient presents with   Gen. Weakness/Pain at Shoulders and Knee    Erika Gates is a 87 y.o. female.  87 year old female with prior medical history as detailed below presents for evaluation with her daughter.  Patient's daughter provides majority of history.  Patient with gradually increasing and worsening weakness.  Daughter reports the patient has had significant trouble lifting her arms above the height of her shoulders for the last 2 to 3 weeks.  Patient does complain of some intermittent pain to the left shoulder.  She also complains of bilateral knee pain.  Patient's daughter reports that over the last 2 to 3 days the pain and weakness of the bilateral lower extremities has worsened to the point where the patient is no longer able to ambulate today.  No reported fevers.  No headache or vision change.  Patient's symptoms have gradually worsening over at least the last 3 weeks.  No reported shortness of breath or chest pain.  The history is provided by the patient and medical records.       Home Medications Prior to Admission medications   Medication Sig Start Date End Date Taking? Authorizing Provider  acetaminophen (TYLENOL) 500 MG tablet Take 1,000 mg by mouth every 6 (six) hours as needed for mild pain.    [provider]  ALPRAZolam (XANAX) 0.5 MG tablet TAKE 1 TABLET BY MOUTH 3 TIMES A DAY AS NEEDED FOR NERVES Patient taking differently: Take 0.5 mg by mouth in the morning and at bedtime. 12/11/13   Noralee Space, MD  amLODipine (NORVASC) 5 MG tablet Take 1 tablet (5 mg total) by mouth daily. 08/29/21   Belva Crome, MD  aspirin EC 81 MG tablet Take 81 mg by mouth daily. Swallow whole.    [provider]  Cholecalciferol (VITAMIN D) 1000 UNITS capsule Take 2,000 Units by mouth daily.     [provider]  cyclobenzaprine (FLEXERIL) 10 MG tablet Take 10 mg by mouth 2 (two) times daily as needed for muscle spasms.    [provider]  dapagliflozin propanediol (FARXIGA) 10 MG TABS tablet Take 1 tablet (10 mg total) by mouth daily before breakfast. 02/02/22   Belva Crome, MD  diclofenac sodium (VOLTAREN) 1 % GEL APPLY 1 GM TO AFFECTED AREA UP TO 3 TIMES A DAY AS NEEDED 05/17/17   Bo Merino, MD  furosemide (LASIX) 20 MG tablet Take 1 tablet (20 mg total) by mouth daily. 07/18/21 07/18/22  Oswald Hillock, MD  gabapentin (NEURONTIN) 100 MG capsule Take 1 capsule (100 mg total) by mouth at bedtime. Patient taking differently: Take 100 mg by mouth daily. 03/17/18   Ofilia Neas, PA-C  gabapentin (NEURONTIN) 300 MG capsule Take 300 mg by mouth at bedtime. 05/24/21   [provider]  glimepiride (AMARYL) 2 MG tablet Take 2 mg by mouth daily with breakfast. Pt takes 1/2 1 mg tablet once a day.    [provider]  lisinopril (ZESTRIL) 20 MG tablet Take 1 tablet (20 mg total) by mouth daily. 07/18/21 07/18/22  Oswald Hillock, MD  metoprolol succinate (TOPROL-XL) 100 MG 24 hr tablet Take 1 tablet (100 mg total) by mouth daily. 12/11/13   Noralee Space, MD  Multiple Vitamins-Minerals (PROTEGRA CARDIO PO) Take 1 capsule by mouth daily.  [provider]  rosuvastatin (CRESTOR) 40 MG tablet Take 40 mg by mouth daily. 02/23/17   [provider]  vitamin E 100 UNIT capsule Take 100 Units by mouth daily.    [provider]      Allergies    Metformin    Review of Systems   Review of Systems  All other systems reviewed and are negative.   Physical Exam Updated Vital Signs BP (!) 169/80   Pulse 67   Temp 98.1 F (36.7 C) (Oral)   Resp 16   SpO2 96%  Physical Exam Vitals and nursing note reviewed.  Constitutional:      General: She is not in acute distress.    Appearance: Normal appearance. She is well-developed.  HENT:      Head: Normocephalic and atraumatic.  Eyes:     Conjunctiva/sclera: Conjunctivae normal.     Pupils: Pupils are equal, round, and reactive to light.  Cardiovascular:     Rate and Rhythm: Normal rate and regular rhythm.     Heart sounds: Normal heart sounds.  Pulmonary:     Effort: Pulmonary effort is normal. No respiratory distress.     Breath sounds: Normal breath sounds.  Abdominal:     General: There is no distension.     Palpations: Abdomen is soft.     Tenderness: There is no abdominal tenderness.  Musculoskeletal:        General: No deformity.     Cervical back: Normal range of motion and neck supple.     Comments: Patient is alert, oriented x 3, normal speech.  No facial droop.  Patient with difficulty lifting arms above level of shoulder.  Unclear whether pain versus actual muscular weakness is the problem.  Patient with intact sensation to touch to both lower extremities distally.  Patient appears to be have intake strength distally to BLE.   Skin:    General: Skin is warm and dry.  Neurological:     General: No focal deficit present.     Mental Status: She is alert and oriented to person, place, and time.     ED Results / Procedures / Treatments   Labs (all labs ordered are listed, but only abnormal results are displayed) Labs Reviewed  CBC WITH DIFFERENTIAL/PLATELET - Abnormal; Notable for the following components:      Result Value   Hemoglobin 11.2 (*)    MCHC 28.9 (*)    RDW 16.2 (*)    Platelets 119 (*)    All other components within normal limits  COMPREHENSIVE METABOLIC PANEL - Abnormal; Notable for the following components:   BUN 29 (*)    Albumin 3.2 (*)    All other components within normal limits  SEDIMENTATION RATE - Abnormal; Notable for the following components:   Sed Rate 49 (*)    All other components within normal limits  URINALYSIS, ROUTINE W REFLEX MICROSCOPIC - Abnormal; Notable for the following components:   Glucose, UA >=500 (*)     Protein, ur 30 (*)    All other components within normal limits  RESP PANEL BY RT-PCR (RSV, FLU A&B, COVID)  RVPGX2  LACTIC ACID, PLASMA  TSH  CK  TROPONIN I (HIGH SENSITIVITY)  TROPONIN I (HIGH SENSITIVITY)    EKG None  Radiology CT Head Wo Contrast  Result Date: 08/10/2022 CLINICAL DATA:  Altered mental status EXAM: CT HEAD WITHOUT CONTRAST TECHNIQUE: Contiguous axial images were obtained from the base of the skull through the vertex  without intravenous contrast. RADIATION DOSE REDUCTION: This exam was performed according to the departmental dose-optimization program which includes automated exposure control, adjustment of the mA and/or kV according to patient size and/or use of iterative reconstruction technique. COMPARISON:  03/04/2022 FINDINGS: Brain: No acute intracranial findings are seen. There are no signs of bleeding within the cranium. Ventricles are not dilated. Cortical sulci are prominent. There is 9 x 7 mm dense calcification adjacent to the inner table of right anterior frontal calvarium close to midline with no significant change. There is no adjacent edema or mass effect. Vascular: Unremarkable. Skull: No fracture is seen. Sinuses/Orbits: Unremarkable. Other: Degenerative changes are noted in visualized upper cervical spine with posterior bony spurs causing extrinsic pressure over the ventral margin of thecal sac. IMPRESSION: No acute intracranial findings are seen in noncontrast CT brain. Atrophy. There is 9 x 7 mm dense calcification in the right anterior frontal region in the meninges, possibly small meningioma or dystrophic calcification. There is no adjacent edema or mass effect. Cervical spondylosis. Electronically Signed   By: Ernie Avena M.D.   On: 08/10/2022 17:41   DG Chest Port 1 View  Result Date: 08/10/2022 CLINICAL DATA:  weakness EXAM: PORTABLE CHEST 1 VIEW COMPARISON:  July 15, 2021 FINDINGS: The cardiomediastinal silhouette is unchanged in  contour.Status post median sternotomy and CABG. Elevation of the RIGHT hemidiaphragm. Perihilar vascular fullness without overt edema. No pleural effusion. No pneumothorax. No acute pleuroparenchymal abnormality. IMPRESSION: Perihilar vascular fullness without overt pulmonary edema. Electronically Signed   By: Meda Klinefelter M.D.   On: 08/10/2022 16:49   DG Knee Complete 4 Views Right  Result Date: 08/10/2022 CLINICAL DATA:  pain EXAM: RIGHT KNEE - COMPLETE 4 VIEW COMPARISON:  None Available. FINDINGS: No evidence of fracture, dislocation, or joint effusion. There is tricompartmental joint space narrowing, sclerosis and osteophytes consistent with degenerative joint disease. No evidence of effusion. IMPRESSION: Degenerative changes. Electronically Signed   By: Layla Maw M.D.   On: 08/10/2022 09:05   DG Shoulder Left  Result Date: 08/10/2022 CLINICAL DATA:  Pain EXAM: LEFT SHOULDER-3 VIEW COMPARISON:  None Available. FINDINGS: Loss of acromial humeral distance consistent with underlying rotator cuff defect. Glenohumeral and acromioclavicular degenerative changes with osteophytes, subchondral cyst formation and joint space narrowing. No acute fracture, dislocation or subluxation identified. IMPRESSION: Degenerative changes.  Possible rotator cuff defect. Electronically Signed   By: Layla Maw M.D.   On: 08/10/2022 09:04   DG Knee Complete 4 Views Left  Result Date: 08/10/2022 CLINICAL DATA:  pain EXAM: LEFT KNEE - COMPLETE 4 VIEW COMPARISON:  None Available. FINDINGS: No evidence of fracture, dislocation, or joint effusion. There is tricompartmental joint space narrowing, sclerosis and osteophytes consistent with degenerative joint disease. No evidence of effusion. IMPRESSION: Degenerative changes. Electronically Signed   By: Layla Maw M.D.   On: 08/10/2022 09:03    Procedures Procedures    Medications Ordered in ED Medications - No data to display  ED Course/ Medical  Decision Making/ A&P                           Medical Decision Making Amount and/or Complexity of Data Reviewed Labs: ordered. Radiology: ordered.  Risk Decision regarding hospitalization.    Medical Screen Complete  This patient presented to the ED with complaint of progressive weakness.  This complaint involves an extensive number of treatment options. The initial differential diagnosis includes, but is not limited to, myositis, deconditioning, metabolic  abnormality, infection, etc.  This presentation is: Acute, Chronic, Self-Limited, Previously Undiagnosed, Uncertain Prognosis, Complicated, Systemic Symptoms, and Threat to Life/Bodily Function  Patient is presenting with gradually progressive proximal weakness of both upper and lower extremities.  Patient with multiple comorbidities  . Presentation could be consistent with advanced osteoarthritis with associated deconditioning.  However, patient's labs reveal mildly elevated sed rate at 49.  Patient is on a statin, which may be contributing to her symptoms. CK is normal.  Presentation is not entirely inconsistent with possible polymyalgia rheumatica.  Patient is unable to ambulate - which she is able to do at baseline.  Patient would benefit from admission for further workup and evaluation.  Hospitalist service made aware of case and will evaluate.  Additional history obtained:  Additional history obtained from [facility] External records from outside sources obtained and reviewed including prior ED visits and prior Inpatient records.    Lab Tests:  I ordered and personally interpreted labs.  The pertinent results include: CBC, CMP, UA, TSH, CK, sed rate, troponin   Imaging Studies ordered:  I ordered imaging studies including CT head, plain films of chest, right knee, left shoulder, left knee I independently visualized and interpreted obtained imaging which showed NAD I agree with the radiologist  interpretation.   Cardiac Monitoring:  The patient was maintained on a cardiac monitor.  I personally viewed and interpreted the cardiac monitor which showed an underlying rhythm of: NSR   Problem List / ED Course:  Weakness, inability to ambulate   Reevaluation:  After the interventions noted above, I reevaluated the patient and found that they have: stayed the same  Disposition:  After consideration of the diagnostic results and the patients response to treatment, I feel that the patent would benefit from admission.          Final Clinical Impression(s) / ED Diagnoses Final diagnoses:  Weakness    Rx / DC Orders ED Discharge Orders     None         Valarie Merino, MD 08/10/22 2124

## 2022-08-10 NOTE — Assessment & Plan Note (Signed)
noted 

## 2022-08-10 NOTE — ED Triage Notes (Signed)
Patient arrived with EMS from home family reported to EMS generalized weakness with bilateral shoulder pain and right knee pain onset last week , denies injury or fall , CBG = 130.

## 2022-08-10 NOTE — H&P (Signed)
History and Physical    Patient: Erika Gates [id number] DOB: February 07, 1935 DOA: 08/10/2022 DOS: the patient was seen and examined on 08/10/2022 PCP: Donald Prose, MD  Patient coming from: Home  Chief Complaint:  Chief Complaint  Patient presents with   Gen. Weakness/Pain at Shoulders and Knee   HPI: Erika Gates is a 87 y.o. female with medical history significant of CAD s/p CABG, hypertension, hyperlipidemia and morbid obesity who comes in with progressive bilateral upper extremity and lower extremity weakness.  Grandson and daughter at bedside helps to collaborate history. Pt has some chronic ROM issues with her left shoulder but for the past 2-3 weeks had progressive worsening range of motion to her right upper extremity. Has been having difficulty reaching overhead to do daily activities like brushing her hair. Now having trouble even reaching out to hold her walker. Noticed worse weakness of her shoulders in the morning. Hands have been swollen. Unclear if she is having paresthesias. Also has chronic knee problems but the left knee and been worse. Today left knee buckled and almost fell.   She had a fall in July and had a CT cervical spine showing only severe multilevel degenerative change. No other trauma since.   No complaints of headache or worsening vision.   She is on a statin which family is concerned about but has been on this for some time.    In the ED, she was afebrile, heart rate of 56, BP of 179/79. CK and CRP within normal limits. TSH normal.   CT head showed possible meningioma but otherwise no acute findings.  Bilateral knee x-ray with no acute fracture or dislocation or joint effusions.  Only signs consistent with degenerative joint disease.  Left shoulder X-ray with findings consistent with rotator cuff defect.  Hospitalist was consulted for further workup of her symptoms as patient was unable to ambulate due to her severe weakness. Review of Systems:  As mentioned in the history of present illness. All other systems reviewed and are negative. Past Medical History:  Diagnosis Date   Allergic rhinitis    Anemia    Anxiety    Coronary atherosclerosis of unspecified type of vessel, native or graft    Diabetes mellitus    DJD (degenerative joint disease)    Hypercholesteremia    Hypertension    Low back pain    Morbid obesity (HCC)    Venous insufficiency    Vitamin D deficiency    Past Surgical History:  Procedure Laterality Date   ABDOMINAL HYSTERECTOMY     CORONARY ARTERY BYPASS GRAFT     3 vessel- Dr. Cyndia Bent 8/96   Social History:  reports that she has never smoked. She has never used smokeless tobacco. She reports that she does not drink alcohol and does not use drugs.  Allergies  Allergen Reactions   Metformin Other (See Comments)    REACTION: pt refuses METFORMIN "it made me sad"...    Family History  Problem Relation Age of Onset   Heart disease Mother    Heart disease Brother     Prior to Admission medications   Medication Sig Start Date End Date Taking? Authorizing Provider  acetaminophen (TYLENOL) 500 MG tablet Take 1,000 mg by mouth every 6 (six) hours as needed for mild pain.    [provider]  ALPRAZolam (XANAX) 0.5 MG tablet TAKE 1 TABLET BY MOUTH 3 TIMES A DAY AS NEEDED FOR NERVES Patient taking differently: Take 0.5 mg by mouth in the  morning and at bedtime. 12/11/13   Noralee Space, MD  amLODipine (NORVASC) 5 MG tablet Take 1 tablet (5 mg total) by mouth daily. 08/29/21   Belva Crome, MD  aspirin EC 81 MG tablet Take 81 mg by mouth daily. Swallow whole.    [provider]  Cholecalciferol (VITAMIN D) 1000 UNITS capsule Take 2,000 Units by mouth daily.     [provider]  cyclobenzaprine (FLEXERIL) 10 MG tablet Take 10 mg by mouth 2 (two) times daily as needed for muscle spasms.    [provider]  dapagliflozin propanediol (FARXIGA) 10 MG TABS tablet Take 1 tablet (10  mg total) by mouth daily before breakfast. 02/02/22   Belva Crome, MD  diclofenac sodium (VOLTAREN) 1 % GEL APPLY 1 GM TO AFFECTED AREA UP TO 3 TIMES A DAY AS NEEDED 05/17/17   Bo Merino, MD  furosemide (LASIX) 20 MG tablet Take 1 tablet (20 mg total) by mouth daily. 07/18/21 07/18/22  Oswald Hillock, MD  gabapentin (NEURONTIN) 100 MG capsule Take 1 capsule (100 mg total) by mouth at bedtime. Patient taking differently: Take 100 mg by mouth daily. 03/17/18   Ofilia Neas, PA-C  gabapentin (NEURONTIN) 300 MG capsule Take 300 mg by mouth at bedtime. 05/24/21   [provider]  glimepiride (AMARYL) 2 MG tablet Take 2 mg by mouth daily with breakfast. Pt takes 1/2 1 mg tablet once a day.    [provider]  lisinopril (ZESTRIL) 20 MG tablet Take 1 tablet (20 mg total) by mouth daily. 07/18/21 07/18/22  Oswald Hillock, MD  metoprolol succinate (TOPROL-XL) 100 MG 24 hr tablet Take 1 tablet (100 mg total) by mouth daily. 12/11/13   Noralee Space, MD  Multiple Vitamins-Minerals (PROTEGRA CARDIO PO) Take 1 capsule by mouth daily.      [provider]  rosuvastatin (CRESTOR) 40 MG tablet Take 40 mg by mouth daily. 02/23/17   [provider]  vitamin E 100 UNIT capsule Take 100 Units by mouth daily.    [provider]    Physical Exam: Vitals:   08/10/22 2000 08/10/22 2030 08/10/22 2100 08/10/22 2130  BP: (!) 165/80 (!) 180/86 (!) 140/75 (!) 143/65  Pulse: 68 69 76 73  Resp:   16   Temp:      TempSrc:      SpO2: 96% 96% 93% 96%   Constitutional: NAD, calm, comfortable, morbidly obese female laying in bed Eyes: lids and conjunctivae normal ENMT: Mucous membranes are moist.  Neck: normal, supple, Respiratory: clear to auscultation bilaterally, no wheezing, no crackles. Normal respiratory effort. No accessory muscle use.  Cardiovascular: Regular rate and rhythm, no murmurs / rubs / gallops. No extremity edema. Abdomen: no tenderness,  Bowel sounds  positive.  Musculoskeletal: no clubbing / cyanosis. No joint deformity upper and lower extremities.  Normal muscle tone.  Passive ROM of bilateral UE: 90 degree only with overhead extension and abduction. Unable to fully reach back with external rotation.  Active ROM of bilateral CV:ELFYB 150 degree with extension and abduction.  Skin: no rashes, lesions, ulcers. No induration Neurologic: CN 2-12 grossly intact. Strength 5/5 in lower extremity. 4/5 of upper extremities.  Psychiatric: Normal judgment and insight. Alert and oriented x 3. Normal mood. Data Reviewed:  See HPI  Assessment and Plan: * Upper extremity weakness -presents with progressive bilateral upper extremity weakness with decrease passive ROM over the past several weeks with worsening stiffness in the morning.  Based on left shoulder X-ray imaging she likely has chronic rotator cuff defect but does not explain concomitant worsening of her right UE. Symptoms concerning for cervical pathology vs PMR. No other neurological findings to suggest Giant cell arteritis.  -Doubt related to statin since she is not complaining of myalgia but mostly weakness -CPR, CK and TSH are normal. ESR is mildly elevated at 49.  -will check rheumatoid factor and anti-CCP to rule out other rheumatic disease -she had CT cervical spine in July revealing severe multilevel degenerative change. Will obtain MRI C-spine wo contrast.  -PT evaluation  Anxiety -continue home Xanax  Weakness of left lower extremity -reports buckling and weakness of left knee. Suspect this is just had acute on chronic issue. She has hx of receiving injections in that knee before. Probably is not bilateral. Feel this is isolated and separate from her acute bilateral upper extremity weakness -knee X-ray with degenerative changes as expected -PT to evaluate  History of coronary artery disease s/p CABG  -no chest pain  Essential hypertension -elevated. Continue home  antihypertensives  MORBID OBESITY -noted      Advance Care Planning:   Code Status: Full Code   Consults: none  Family Communication: grandson and daughter at bedside  Severity of Illness: The appropriate patient status for this patient is OBSERVATION. Observation status is judged to be reasonable and necessary in order to provide the required intensity of service to ensure the patient's safety. The patient's presenting symptoms, physical exam findings, and initial radiographic and laboratory data in the context of their medical condition is felt to place them at decreased risk for further clinical deterioration. Furthermore, it is anticipated that the patient will be medically stable for discharge from the hospital within 2 midnights of admission.   Author: Orene Desanctis, DO 08/10/2022 10:29 PM  For on call review [email or web address].

## 2022-08-10 NOTE — Assessment & Plan Note (Signed)
-  no chest pain. 

## 2022-08-10 NOTE — Assessment & Plan Note (Signed)
-  reports buckling and weakness of left knee. Suspect this is just had acute on chronic issue. She has hx of receiving injections in that knee before. Probably is not bilateral. Feel this is isolated and separate from her acute bilateral upper extremity weakness -knee X-ray with degenerative changes as expected -PT to evaluate

## 2022-08-10 NOTE — ED Notes (Signed)
Pt to MRI via stretcher by transport.

## 2022-08-10 NOTE — Assessment & Plan Note (Signed)
-  continue home Xanax

## 2022-08-11 ENCOUNTER — Encounter (HOSPITAL_COMMUNITY): Payer: Self-pay | Admitting: Family Medicine

## 2022-08-11 DIAGNOSIS — Z8679 Personal history of other diseases of the circulatory system: Secondary | ICD-10-CM | POA: Diagnosis not present

## 2022-08-11 DIAGNOSIS — M4802 Spinal stenosis, cervical region: Secondary | ICD-10-CM | POA: Diagnosis not present

## 2022-08-11 DIAGNOSIS — R29898 Other symptoms and signs involving the musculoskeletal system: Secondary | ICD-10-CM | POA: Diagnosis not present

## 2022-08-11 DIAGNOSIS — I10 Essential (primary) hypertension: Secondary | ICD-10-CM | POA: Diagnosis not present

## 2022-08-11 DIAGNOSIS — G992 Myelopathy in diseases classified elsewhere: Secondary | ICD-10-CM | POA: Diagnosis not present

## 2022-08-11 DIAGNOSIS — G8250 Quadriplegia, unspecified: Secondary | ICD-10-CM | POA: Diagnosis not present

## 2022-08-11 LAB — CBC
HCT: 38 % (ref 36.0–46.0)
Hemoglobin: 11.4 g/dL — ABNORMAL LOW (ref 12.0–15.0)
MCH: 28.1 pg (ref 26.0–34.0)
MCHC: 30 g/dL (ref 30.0–36.0)
MCV: 93.6 fL (ref 80.0–100.0)
Platelets: 107 10*3/uL — ABNORMAL LOW (ref 150–400)
RBC: 4.06 MIL/uL (ref 3.87–5.11)
RDW: 15.9 % — ABNORMAL HIGH (ref 11.5–15.5)
WBC: 5.4 10*3/uL (ref 4.0–10.5)
nRBC: 0 % (ref 0.0–0.2)

## 2022-08-11 MED ORDER — FOLIC ACID 1 MG PO TABS
1.0000 mg | ORAL_TABLET | Freq: Every day | ORAL | Status: DC
  Administered 2022-08-12 – 2022-08-15 (×3): 1 mg via ORAL
  Filled 2022-08-11 (×5): qty 1

## 2022-08-11 NOTE — Plan of Care (Signed)
  Problem: Safety: Goal: Ability to remain free from injury will improve Outcome: Progressing   

## 2022-08-11 NOTE — ED Notes (Signed)
Pt returned from MRI °

## 2022-08-11 NOTE — Consult Note (Signed)
Reason for Consult: Progressive upper and lower extremity weakness Referring Physician: Ronelle Nigh MD  Erika Gates is an 87 y.o. female.  HPI: Patient is an 87 year old individual who has had difficulty with strength in her lower extremities for at least a years time perhaps even longer she walks with the use of a walker but in the last few weeks it appears that she has developed some increasing weakness in her upper extremities and a few days ago was noted that she had a hard time using her left hand which is dominant.  Her daughters note that she has been using the right hand to help move her left hand above her head and doing her self-care items.  It appears that she has not really been able to walk over a couple of week period of time when the arm weakness seem to get worse she was presented to the emergency department and a workup ensued this included an MRI of the cervical spine which demonstrates that the patient has high-grade stenosis at C3-4 C4-5 and C5-6 C3-4 and 5 6 are clearly the worst with evidence of cord compression and some cord signal changes noted at C3-4.  Patient notes that she has had some modest neck pain but nothing that has been completely debilitating for her.  Is consulted now to see if surgical intervention is an appropriate consideration.  Past Medical History:  Diagnosis Date   Allergic rhinitis    Anemia    Anxiety    Coronary atherosclerosis of unspecified type of vessel, native or graft    Diabetes mellitus    DJD (degenerative joint disease)    Hypercholesteremia    Hypertension    Low back pain    Morbid obesity (HCC)    Venous insufficiency    Vitamin D deficiency     Past Surgical History:  Procedure Laterality Date   ABDOMINAL HYSTERECTOMY     CORONARY ARTERY BYPASS GRAFT     3 vessel- Dr. Cyndia Bent 8/96    Family History  Problem Relation Age of Onset   Heart disease Mother    Heart disease Brother     Social History:  reports that she  has never smoked. She has never used smokeless tobacco. She reports that she does not drink alcohol and does not use drugs.  Allergies:  Allergies  Allergen Reactions   Metformin Other (See Comments)    REACTION: pt refuses METFORMIN "it made me sad"...    Medications: I have reviewed the patient's current medications.  Results for orders placed or performed during the hospital encounter of 08/10/22 (from the past 48 hour(s))  Resp panel by RT-PCR (RSV, Flu A&B, Covid) Anterior Nasal Swab     Status: None   Collection Time: 08/10/22  6:18 AM   Specimen: Anterior Nasal Swab  Result Value Ref Range   SARS Coronavirus 2 by RT PCR NEGATIVE NEGATIVE    Comment: (NOTE) SARS-CoV-2 target nucleic acids are NOT DETECTED.  The SARS-CoV-2 RNA is generally detectable in upper respiratory specimens during the acute phase of infection. The lowest concentration of SARS-CoV-2 viral copies this assay can detect is 138 copies/mL. A negative result does not preclude SARS-Cov-2 infection and should not be used as the sole basis for treatment or other patient management decisions. A negative result may occur with  improper specimen collection/handling, submission of specimen other than nasopharyngeal swab, presence of viral mutation(s) within the areas targeted by this assay, and inadequate number of viral copies(<138 copies/mL).  A negative result must be combined with clinical observations, patient history, and epidemiological information. The expected result is Negative.  Fact Sheet for Patients:  EntrepreneurPulse.com.au  Fact Sheet for Healthcare Providers:  IncredibleEmployment.be  This test is no t yet approved or cleared by the [place] FDA and  has been authorized for detection and/or diagnosis of SARS-CoV-2 by FDA under an Emergency Use Authorization (EUA). This EUA will remain  in effect (meaning this test can be used) for the duration of  the COVID-19 declaration under Section 564(b)(1) of the Act, 21 U.S.C.section 360bbb-3(b)(1), unless the authorization is terminated  or revoked sooner.       Influenza A by PCR NEGATIVE NEGATIVE   Influenza B by PCR NEGATIVE NEGATIVE    Comment: (NOTE) The Xpert Xpress SARS-CoV-2/FLU/RSV plus assay is intended as an aid in the diagnosis of influenza from Nasopharyngeal swab specimens and should not be used as a sole basis for treatment. Nasal washings and aspirates are unacceptable for Xpert Xpress SARS-CoV-2/FLU/RSV testing.  Fact Sheet for Patients: EntrepreneurPulse.com.au  Fact Sheet for Healthcare Providers: IncredibleEmployment.be  This test is not yet approved or cleared by the [place] FDA and has been authorized for detection and/or diagnosis of SARS-CoV-2 by FDA under an Emergency Use Authorization (EUA). This EUA will remain in effect (meaning this test can be used) for the duration of the COVID-19 declaration under Section 564(b)(1) of the Act, 21 U.S.C. section 360bbb-3(b)(1), unless the authorization is terminated or revoked.     Resp Syncytial Virus by PCR NEGATIVE NEGATIVE    Comment: (NOTE) Fact Sheet for Patients: EntrepreneurPulse.com.au  Fact Sheet for Healthcare Providers: IncredibleEmployment.be  This test is not yet approved or cleared by the [place] FDA and has been authorized for detection and/or diagnosis of SARS-CoV-2 by FDA under an Emergency Use Authorization (EUA). This EUA will remain in effect (meaning this test can be used) for the duration of the COVID-19 declaration under Section 564(b)(1) of the Act, 21 U.S.C. section 360bbb-3(b)(1), unless the authorization is terminated or revoked.  Performed at [facility], [place] [address], [place], [place] [address]   CBC with Differential     Status: Abnormal   Collection Time: 08/10/22  4:49 PM  Result  Value Ref Range   WBC 4.9 4.0 - 10.5 K/uL   RBC 4.04 3.87 - 5.11 MIL/uL   Hemoglobin 11.2 (L) 12.0 - 15.0 g/dL   HCT 38.7 36.0 - 46.0 %   MCV 95.8 80.0 - 100.0 fL   MCH 27.7 26.0 - 34.0 pg   MCHC 28.9 (L) 30.0 - 36.0 g/dL   RDW 16.2 (H) 11.5 - 15.5 %   Platelets 119 (L) 150 - 400 K/uL    Comment: REPEATED TO VERIFY   nRBC 0.0 0.0 - 0.2 %   Neutrophils Relative % 58 %   Neutro Abs 2.8 1.7 - 7.7 K/uL   Lymphocytes Relative 28 %   Lymphs Abs 1.3 0.7 - 4.0 K/uL   Monocytes Relative 10 %   Monocytes Absolute 0.5 0.1 - 1.0 K/uL   Eosinophils Relative 3 %   Eosinophils Absolute 0.2 0.0 - 0.5 K/uL   Basophils Relative 1 %   Basophils Absolute 0.0 0.0 - 0.1 K/uL   Immature Granulocytes 0 %   Abs Immature Granulocytes 0.02 0.00 - 0.07 K/uL    Comment: Performed at [facility], [place] [address] [place], [place] [address]  Troponin I (High Sensitivity)     Status:  None   Collection Time: 08/10/22  4:49 PM  Result Value Ref Range   Troponin I (High Sensitivity) 11 <18 ng/L    Comment: (NOTE) Elevated high sensitivity troponin I (hsTnI) values and significant  changes across serial measurements may suggest ACS but many other  chronic and acute conditions are known to elevate hsTnI results.  Refer to the "Links" section for chest pain algorithms and additional  guidance. Performed at [facility], [place] [address], [place], [place] [address]   Lactic acid, plasma     Status: None   Collection Time: 08/10/22  4:49 PM  Result Value Ref Range   Lactic Acid, Venous 0.9 0.5 - 1.9 mmol/L    Comment: Performed at [facility] [address] [place], [place] [address]  Comprehensive metabolic panel     Status: Abnormal   Collection Time: 08/10/22  4:49 PM  Result Value Ref Range   Sodium 145 135 - 145 mmol/L   Potassium 4.7 3.5 - 5.1 mmol/L    Comment: HEMOLYSIS AT THIS LEVEL MAY AFFECT RESULT   Chloride 111 98 - 111 mmol/L   CO2 26 22 - 32 mmol/L   Glucose, Bld 82 70 -  99 mg/dL    Comment: Glucose reference range applies only to samples taken after fasting for at least 8 hours.   BUN 29 (H) 8 - 23 mg/dL   Creatinine, Ser 0.90 0.44 - 1.00 mg/dL   Calcium 10.0 8.9 - 10.3 mg/dL   Total Protein 6.6 6.5 - 8.1 g/dL   Albumin 3.2 (L) 3.5 - 5.0 g/dL   AST 30 15 - 41 U/L    Comment: HEMOLYSIS AT THIS LEVEL MAY AFFECT RESULT   ALT 24 0 - 44 U/L    Comment: HEMOLYSIS AT THIS LEVEL MAY AFFECT RESULT   Alkaline Phosphatase 81 38 - 126 U/L   Total Bilirubin 0.5 0.3 - 1.2 mg/dL    Comment: HEMOLYSIS AT THIS LEVEL MAY AFFECT RESULT   GFR, Estimated >60 >60 mL/min    Comment: (NOTE) Calculated using the CKD-EPI Creatinine Equation (2021)    Anion gap 8 5 - 15    Comment: Performed at [facility], [place] [address], [place], [place] [address]  Sedimentation rate     Status: Abnormal   Collection Time: 08/10/22  4:49 PM  Result Value Ref Range   Sed Rate 49 (H) 0 - 22 mm/hr    Comment: Performed at [facility] [address] [place], [place] [address]  CK     Status: None   Collection Time: 08/10/22  4:49 PM  Result Value Ref Range   Total CK 88 38 - 234 U/L    Comment: HEMOLYSIS AT THIS LEVEL MAY AFFECT RESULT Performed at [facility], [place] [address] [place], [place] [address]   TSH     Status: None   Collection Time: 08/10/22  5:04 PM  Result Value Ref Range   TSH 1.783 0.350 - 4.500 uIU/mL    Comment: Performed by a 3rd Generation assay with a functional sensitivity of <=0.01 uIU/mL. Performed at [facility], [place] [address], [place], [place] [address]   Urinalysis, Routine w reflex microscopic Urine, In & Out Cath     Status: Abnormal   Collection Time: 08/10/22  7:40 PM  Result Value Ref Range   Color, Urine YELLOW YELLOW   APPearance CLEAR CLEAR   Specific Gravity, Urine 1.017 1.005 -  1.030   pH 5.0 5.0 - 8.0   Glucose, UA >=500 (A) NEGATIVE mg/dL   Hgb urine dipstick NEGATIVE NEGATIVE   Bilirubin Urine NEGATIVE  NEGATIVE   Ketones, ur NEGATIVE NEGATIVE mg/dL   Protein, ur 30 (A) NEGATIVE mg/dL   Nitrite NEGATIVE NEGATIVE   Leukocytes,Ua NEGATIVE NEGATIVE   RBC / HPF 0-5 0 - 5 RBC/hpf   WBC, UA 0-5 0 - 5 WBC/hpf   Bacteria, UA NONE SEEN NONE SEEN   Squamous Epithelial / LPF 0-5 0 - 5    Comment: Performed at [facility], [place] [address], [place], [place] [address]  Troponin I (High Sensitivity)     Status: None   Collection Time: 08/10/22  7:40 PM  Result Value Ref Range   Troponin I (High Sensitivity) 11 <18 ng/L    Comment: (NOTE) Elevated high sensitivity troponin I (hsTnI) values and significant  changes across serial measurements may suggest ACS but many other  chronic and acute conditions are known to elevate hsTnI results.  Refer to the "Links" section for chest pain algorithms and additional  guidance. Performed at [facility], [place] [address], [place], [place] [address]   CBC     Status: Abnormal   Collection Time: 08/11/22  1:43 AM  Result Value Ref Range   WBC 5.4 4.0 - 10.5 K/uL   RBC 4.06 3.87 - 5.11 MIL/uL   Hemoglobin 11.4 (L) 12.0 - 15.0 g/dL   HCT 38.0 36.0 - 46.0 %   MCV 93.6 80.0 - 100.0 fL   MCH 28.1 26.0 - 34.0 pg   MCHC 30.0 30.0 - 36.0 g/dL   RDW 15.9 (H) 11.5 - 15.5 %   Platelets 107 (L) 150 - 400 K/uL    Comment: REPEATED TO VERIFY   nRBC 0.0 0.0 - 0.2 %    Comment: Performed at [facility], [place] [address] [place], [place] [address]    MR CERVICAL SPINE WO CONTRAST  Result Date: 08/10/2022 CLINICAL DATA:  Bilateral upper and lower extremity weakness EXAM: MRI CERVICAL SPINE WITHOUT CONTRAST TECHNIQUE: Multiplanar, multisequence MR imaging of the cervical spine was performed. No intravenous contrast was administered. COMPARISON:  No prior MRI of the cervical spine, correlation is made with CT cervical spine 03/04/2022 FINDINGS: Alignment: Reversal of the normal cervical lordosis. 2 mm retrolisthesis C3 on C4. 4 mm anterolisthesis of C5 on  C6. 2 mm anterolisthesis of C7 on T1. Vertebrae: No fracture, evidence of discitis, or bone lesion. Endplate degenerative changes, most prominent at C6-C7. Cord: Normal spinal cord signal.  Cord flattening at C3-C4. Posterior Fossa, vertebral arteries, paraspinal tissues: Empty sella. Normal vertebral artery flow voids. No acute finding in the paraspinal tissues. Disc levels: C2-C3: No significant disc bulge. Right greater than left facet arthropathy. No spinal canal stenosis or neural foraminal narrowing. C3-C4: Trace retrolisthesis and mild disc bulge. Facet and uncovertebral hypertrophy. Severe spinal canal stenosis with flattening of the spinal cord. Mild to moderate bilateral neural foraminal narrowing. C4-C5: Mild disc bulge with left paracentral disc extrusion with 5 mm of caudal migration, which indents and deforms the ventral spinal cord below the disc space. Mild spinal canal stenosis. Mild-to-moderate bilateral neural foraminal narrowing. C5-C6: Mild anterolisthesis with disc unroofing. Uncovertebral and facet arthropathy. Moderate spinal canal stenosis with some milder cord flattening. Mild bilateral neural foraminal narrowing. C6-C7: Disc height loss with minimal disc bulge. Facet and uncovertebral hypertrophy. Mild spinal canal stenosis. No neural foraminal narrowing. C7-T1: No  significant disc bulge. No spinal canal stenosis or neuroforaminal narrowing. IMPRESSION: 1. C3-C4 severe spinal canal stenosis and mild-to-moderate bilateral neural foraminal narrowing. There is cord flattening at this level, without abnormal cord signal. 2. C4-C5 mild spinal canal stenosis and mild-to-moderate bilateral neural foraminal narrowing. 3. C5-C6 moderate spinal canal stenosis and mild bilateral neural foraminal narrowing. 4. C6-C7 mild spinal canal stenosis. Electronically Signed   By: Merilyn Baba M.D.   On: 08/10/2022 23:43   CT Head Wo Contrast  Result Date: 08/10/2022 CLINICAL DATA:  Altered mental status  EXAM: CT HEAD WITHOUT CONTRAST TECHNIQUE: Contiguous axial images were obtained from the base of the skull through the vertex without intravenous contrast. RADIATION DOSE REDUCTION: This exam was performed according to the departmental dose-optimization program which includes automated exposure control, adjustment of the mA and/or kV according to patient size and/or use of iterative reconstruction technique. COMPARISON:  03/04/2022 FINDINGS: Brain: No acute intracranial findings are seen. There are no signs of bleeding within the cranium. Ventricles are not dilated. Cortical sulci are prominent. There is 9 x 7 mm dense calcification adjacent to the inner table of right anterior frontal calvarium close to midline with no significant change. There is no adjacent edema or mass effect. Vascular: Unremarkable. Skull: No fracture is seen. Sinuses/Orbits: Unremarkable. Other: Degenerative changes are noted in visualized upper cervical spine with posterior bony spurs causing extrinsic pressure over the ventral margin of thecal sac. IMPRESSION: No acute intracranial findings are seen in noncontrast CT brain. Atrophy. There is 9 x 7 mm dense calcification in the right anterior frontal region in the meninges, possibly small meningioma or dystrophic calcification. There is no adjacent edema or mass effect. Cervical spondylosis. Electronically Signed   By: Elmer Picker M.D.   On: 08/10/2022 17:41   DG Chest Port 1 View  Result Date: 08/10/2022 CLINICAL DATA:  weakness EXAM: PORTABLE CHEST 1 VIEW COMPARISON:  July 15, 2021 FINDINGS: The cardiomediastinal silhouette is unchanged in contour.Status post median sternotomy and CABG. Elevation of the RIGHT hemidiaphragm. Perihilar vascular fullness without overt edema. No pleural effusion. No pneumothorax. No acute pleuroparenchymal abnormality. IMPRESSION: Perihilar vascular fullness without overt pulmonary edema. Electronically Signed   By: Valentino Saxon M.D.    On: 08/10/2022 16:49   DG Knee Complete 4 Views Right  Result Date: 08/10/2022 CLINICAL DATA:  pain EXAM: RIGHT KNEE - COMPLETE 4 VIEW COMPARISON:  None Available. FINDINGS: No evidence of fracture, dislocation, or joint effusion. There is tricompartmental joint space narrowing, sclerosis and osteophytes consistent with degenerative joint disease. No evidence of effusion. IMPRESSION: Degenerative changes. Electronically Signed   By: Sammie Bench M.D.   On: 08/10/2022 09:05   DG Shoulder Left  Result Date: 08/10/2022 CLINICAL DATA:  Pain EXAM: LEFT SHOULDER-3 VIEW COMPARISON:  None Available. FINDINGS: Loss of acromial humeral distance consistent with underlying rotator cuff defect. Glenohumeral and acromioclavicular degenerative changes with osteophytes, subchondral cyst formation and joint space narrowing. No acute fracture, dislocation or subluxation identified. IMPRESSION: Degenerative changes.  Possible rotator cuff defect. Electronically Signed   By: Sammie Bench M.D.   On: 08/10/2022 09:04   DG Knee Complete 4 Views Left  Result Date: 08/10/2022 CLINICAL DATA:  pain EXAM: LEFT KNEE - COMPLETE 4 VIEW COMPARISON:  None Available. FINDINGS: No evidence of fracture, dislocation, or joint effusion. There is tricompartmental joint space narrowing, sclerosis and osteophytes consistent with degenerative joint disease. No evidence of effusion. IMPRESSION: Degenerative changes. Electronically Signed   By: Samuel Germany.D.  On: 08/10/2022 09:03    Review of Systems  Constitutional:  Positive for activity change.  Musculoskeletal:  Positive for gait problem, myalgias, neck pain and neck stiffness.  Neurological:  Positive for weakness and numbness.  All other systems reviewed and are negative.  Blood pressure (!) 151/64, pulse 76, temperature 97.8 F (36.6 C), resp. rate 18, SpO2 98 %. Physical Exam Constitutional:      Appearance: She is obese.  HENT:     Head: Normocephalic and  atraumatic.     Right Ear: Tympanic membrane, ear canal and external ear normal.     Left Ear: Tympanic membrane, ear canal and external ear normal.     Nose: Nose normal.     Mouth/Throat:     Mouth: Mucous membranes are moist.     Pharynx: Oropharynx is clear.  Eyes:     Extraocular Movements: Extraocular movements intact.     Conjunctiva/sclera: Conjunctivae normal.     Pupils: Pupils are equal, round, and reactive to light.  Neck:     Comments: Range of motion limited to 45 degrees left and right flexing and extending about 50% of normal Cardiovascular:     Rate and Rhythm: Normal rate and regular rhythm.  Pulmonary:     Effort: Pulmonary effort is normal.     Breath sounds: Normal breath sounds.  Abdominal:     General: Abdomen is flat.     Palpations: Abdomen is soft.  Skin:    General: Skin is warm and dry.  Neurological:     Mental Status: She is alert.     Comments: 4- out of 5 weakness in the deltoids of the biceps and triceps to grips and intrinsics.  Patient cannot extend the arm above the head in either upper extremity.  Motor function in the lower extremities reveals marked weakness at 4- out of 5 in the iliopsoas quadriceps tibialis anterior and gastrocs.  She has decreased tone in the lower extremities overall.  Absent reflexes are noted in all 4 extremities.  Cranial nerve examination is within the limits of normal.  Psychiatric:        Mood and Affect: Mood normal.        Behavior: Behavior normal.        Thought Content: Thought content normal.        Judgment: Judgment normal.     Assessment/Plan: Impression patient has quadriparesis secondary to advanced spinal cord compromise at the C3 445 and 5 6 levels.  Plan: I had a discussion with the patient and several of her family members including her daughter who was present and another daughter who was available via FaceTime call.  The daughter on the FaceTime call is a wound ostomy nurse at [employer] and expressed a good understanding of the situation.  I noted that surgery for his Hat[place] has substantial risk as she has known cardiovascular disease the risks are of her potential injury to the carotid that could result in a stroke potential injury to the esophagus or the trachea which could result in need for tracheotomy or feeding tube those things not withstanding the surgical decompression would involve 3 levels C3 [id number] is also the concern of how the patient would heal from this process and the degree to which she would improve.  She is 75 and has morbid obesity is a singular large risk factor also her deterioration to this point has been progressive and slow but the recent loss of use of her  upper extremities has put her over the edge and makes her now present with this process.  Not withstanding the complications of surgery could help improve her function to some degree however the risks are ever present and ultimately I believe that the patient should make the decision for herself as to whether she would want to accept the risks of surgery and proceed with an intervention.  The daughter who is a nurse concurred and they would like to think about her options and discuss with her mother in private how she would like to consider proceeding.  I will reevaluate the patient later this afternoon or this evening and discuss what her ultimate decision is regarding surgical intervention.  Blanchie Dessert Iona Stay 08/11/2022, 11:07 AM

## 2022-08-11 NOTE — Progress Notes (Signed)
PROGRESS NOTE    Erika Gates  [id number] DOB: Nov 25, 1934 DOA: 08/10/2022 PCP: Donald Prose, MD   Brief Narrative:  87 y.o. female with medical history significant of CAD s/p CABG, hypertension, hyperlipidemia and morbid obesity presented with worsening upper and lower extremity weakness.  On presentation, CT head showed possible meningioma but otherwise no acute findings.  Bilateral knee x-ray with no acute fractures, dislocation or effusions.  Left shoulder x-ray showed findings consistent with rotator cuff defect.  MRI of cervical spine was ordered.  Assessment & Plan:   Cervical spine stenosis Bilateral upper extremity weakness -MRI of cervical spine showed moderate to severe spinal canal stenosis at various levels.  I have consulted neurosurgery.  Follow recommendations. -Follow cultures.  PT eval.  Bilateral lower extremity weakness -Possibly from worsening osteoarthritis.  Has had injections to bilateral knees by Dr. Deveshwar/rheumatology -PT eval.  Outpatient follow-up with Dr. Rondel Baton. -Bilateral knee x-ray showed degenerative changes  History of CAD status post CABG -Stable.  No chest pain.  Essential hypertension -Resume home antihypertensives once verified  Thrombocytopenia -Questionable cause.  Monitor  Normocytic anemia -Possibly from chronic illnesses.  No signs of bleeding.  Monitor intermittently.  Morbid obesity -Outpatient follow-up  DVT prophylaxis: Lovenox Code Status: Full Family Communication: Daughter at bedside Disposition Plan: Status is: Observation The patient will require care spanning > 2 midnights and should be moved to inpatient because: Need for PT eval and neurosurgery evaluation    Consultants: Neurosurgery  Procedures: None  Antimicrobials: None   Subjective: Patient seen and examined at bedside.  Still feels weak and upper extremities and also complains of a leg weakness.  No fever, vomiting, chest pain  reported.  Objective: Vitals:   08/11/22 0600 08/11/22 0630 08/11/22 0800 08/11/22 0949  BP: (!) 158/62 (!) 150/62 (!) 152/74 (!) 151/64  Pulse: 76 75 69 76  Resp: 15 16 16 18   Temp:    97.8 F (36.6 C)  TempSrc:      SpO2: 98% 97% 99% 98%   No intake or output data in the 24 hours ending 08/11/22 1033 There were no vitals filed for this visit.  Examination:  General exam: Appears calm and comfortable.  Looks chronically ill and deconditioned.  Elderly female lying in bed.  On room air. Respiratory system: Bilateral decreased breath sounds at bases with some scattered crackles Cardiovascular system: S1 & S2 heard, Rate controlled Gastrointestinal system: Abdomen is morbidly obese, nondistended, soft and nontender. Normal bowel sounds heard. Extremities: No cyanosis, clubbing, edema  Central nervous system: Alert and oriented.  Slow to respond, poor historian.  No focal neurological deficits.  Decreased range of motion of upper extremities. Skin: No rashes, lesions or ulcers Psychiatry: Flat affect.  No signs of agitation.    Data Reviewed: I have personally reviewed following labs and imaging studies  CBC: Recent Labs  Lab 08/10/22 1649 08/11/22 0143  WBC 4.9 5.4  NEUTROABS 2.8  --   HGB 11.2* 11.4*  HCT 38.7 38.0  MCV 95.8 93.6  PLT 119* [id number]*   Basic Metabolic Panel: Recent Labs  Lab 08/10/22 1649  NA 145  K 4.7  CL 111  CO2 26  GLUCOSE 82  BUN 29*  CREATININE 0.90  CALCIUM 10.0   GFR: CrCl cannot be calculated (Unknown ideal weight.). Liver Function Tests: Recent Labs  Lab 08/10/22 1649  AST 30  ALT 24  ALKPHOS 81  BILITOT 0.5  PROT 6.6  ALBUMIN 3.2*   No results for input(s): "  LIPASE", "AMYLASE" in the last 168 hours. No results for input(s): "AMMONIA" in the last 168 hours. Coagulation Profile: No results for input(s): "INR", "PROTIME" in the last 168 hours. Cardiac Enzymes: Recent Labs  Lab 08/10/22 1649  CKTOTAL 88   BNP (last 3  results) No results for input(s): "PROBNP" in the last 8760 hours. HbA1C: No results for input(s): "HGBA1C" in the last 72 hours. CBG: No results for input(s): "GLUCAP" in the last 168 hours. Lipid Profile: No results for input(s): "CHOL", "HDL", "LDLCALC", "TRIG", "CHOLHDL", "LDLDIRECT" in the last 72 hours. Thyroid Function Tests: Recent Labs    08/10/22 1704  TSH 1.783   Anemia Panel: No results for input(s): "VITAMINB12", "FOLATE", "FERRITIN", "TIBC", "IRON", "RETICCTPCT" in the last 72 hours. Sepsis Labs: Recent Labs  Lab 08/10/22 1649  LATICACIDVEN 0.9    Recent Results (from the past 240 hour(s))  Resp panel by RT-PCR (RSV, Flu A&B, Covid) Anterior Nasal Swab     Status: None   Collection Time: 08/10/22  6:18 AM   Specimen: Anterior Nasal Swab  Result Value Ref Range Status   SARS Coronavirus 2 by RT PCR NEGATIVE NEGATIVE Final    Comment: (NOTE) SARS-CoV-2 target nucleic acids are NOT DETECTED.  The SARS-CoV-2 RNA is generally detectable in upper respiratory specimens during the acute phase of infection. The lowest concentration of SARS-CoV-2 viral copies this assay can detect is 138 copies/mL. A negative result does not preclude SARS-Cov-2 infection and should not be used as the sole basis for treatment or other patient management decisions. A negative result may occur with  improper specimen collection/handling, submission of specimen other than nasopharyngeal swab, presence of viral mutation(s) within the areas targeted by this assay, and inadequate number of viral copies(<138 copies/mL). A negative result must be combined with clinical observations, patient history, and epidemiological information. The expected result is Negative.  Fact Sheet for Patients:  BloggerCourse.com  Fact Sheet for Healthcare Providers:  SeriousBroker.it  This test is no t yet approved or cleared by the [place] FDA and  has  been authorized for detection and/or diagnosis of SARS-CoV-2 by FDA under an Emergency Use Authorization (EUA). This EUA will remain  in effect (meaning this test can be used) for the duration of the COVID-19 declaration under Section 564(b)(1) of the Act, 21 U.S.C.section 360bbb-3(b)(1), unless the authorization is terminated  or revoked sooner.       Influenza A by PCR NEGATIVE NEGATIVE Final   Influenza B by PCR NEGATIVE NEGATIVE Final    Comment: (NOTE) The Xpert Xpress SARS-CoV-2/FLU/RSV plus assay is intended as an aid in the diagnosis of influenza from Nasopharyngeal swab specimens and should not be used as a sole basis for treatment. Nasal washings and aspirates are unacceptable for Xpert Xpress SARS-CoV-2/FLU/RSV testing.  Fact Sheet for Patients: BloggerCourse.com  Fact Sheet for Healthcare Providers: SeriousBroker.it  This test is not yet approved or cleared by the [place] FDA and has been authorized for detection and/or diagnosis of SARS-CoV-2 by FDA under an Emergency Use Authorization (EUA). This EUA will remain in effect (meaning this test can be used) for the duration of the COVID-19 declaration under Section 564(b)(1) of the Act, 21 U.S.C. section 360bbb-3(b)(1), unless the authorization is terminated or revoked.     Resp Syncytial Virus by PCR NEGATIVE NEGATIVE Final    Comment: (NOTE) Fact Sheet for Patients: BloggerCourse.com  Fact Sheet for Healthcare Providers: SeriousBroker.it  This test is not yet approved or cleared by the [place]  FDA and has been authorized for detection and/or diagnosis of SARS-CoV-2 by FDA under an Emergency Use Authorization (EUA). This EUA will remain in effect (meaning this test can be used) for the duration of the COVID-19 declaration under Section 564(b)(1) of the Act, 21 U.S.C. section 360bbb-3(b)(1), unless the  authorization is terminated or revoked.  Performed at [facility], [place] [address] [place], [place] [address]          Radiology Studies: MR CERVICAL SPINE WO CONTRAST  Result Date: 08/10/2022 CLINICAL DATA:  Bilateral upper and lower extremity weakness EXAM: MRI CERVICAL SPINE WITHOUT CONTRAST TECHNIQUE: Multiplanar, multisequence MR imaging of the cervical spine was performed. No intravenous contrast was administered. COMPARISON:  No prior MRI of the cervical spine, correlation is made with CT cervical spine 03/04/2022 FINDINGS: Alignment: Reversal of the normal cervical lordosis. 2 mm retrolisthesis C3 on C4. 4 mm anterolisthesis of C5 on C6. 2 mm anterolisthesis of C7 on T1. Vertebrae: No fracture, evidence of discitis, or bone lesion. Endplate degenerative changes, most prominent at C6-C7. Cord: Normal spinal cord signal.  Cord flattening at C3-C4. Posterior Fossa, vertebral arteries, paraspinal tissues: Empty sella. Normal vertebral artery flow voids. No acute finding in the paraspinal tissues. Disc levels: C2-C3: No significant disc bulge. Right greater than left facet arthropathy. No spinal canal stenosis or neural foraminal narrowing. C3-C4: Trace retrolisthesis and mild disc bulge. Facet and uncovertebral hypertrophy. Severe spinal canal stenosis with flattening of the spinal cord. Mild to moderate bilateral neural foraminal narrowing. C4-C5: Mild disc bulge with left paracentral disc extrusion with 5 mm of caudal migration, which indents and deforms the ventral spinal cord below the disc space. Mild spinal canal stenosis. Mild-to-moderate bilateral neural foraminal narrowing. C5-C6: Mild anterolisthesis with disc unroofing. Uncovertebral and facet arthropathy. Moderate spinal canal stenosis with some milder cord flattening. Mild bilateral neural foraminal narrowing. C6-C7: Disc height loss with minimal disc bulge. Facet and uncovertebral hypertrophy. Mild spinal canal stenosis. No  neural foraminal narrowing. C7-T1: No significant disc bulge. No spinal canal stenosis or neuroforaminal narrowing. IMPRESSION: 1. C3-C4 severe spinal canal stenosis and mild-to-moderate bilateral neural foraminal narrowing. There is cord flattening at this level, without abnormal cord signal. 2. C4-C5 mild spinal canal stenosis and mild-to-moderate bilateral neural foraminal narrowing. 3. C5-C6 moderate spinal canal stenosis and mild bilateral neural foraminal narrowing. 4. C6-C7 mild spinal canal stenosis. Electronically Signed   By: Merilyn Baba M.D.   On: 08/10/2022 23:43   CT Head Wo Contrast  Result Date: 08/10/2022 CLINICAL DATA:  Altered mental status EXAM: CT HEAD WITHOUT CONTRAST TECHNIQUE: Contiguous axial images were obtained from the base of the skull through the vertex without intravenous contrast. RADIATION DOSE REDUCTION: This exam was performed according to the departmental dose-optimization program which includes automated exposure control, adjustment of the mA and/or kV according to patient size and/or use of iterative reconstruction technique. COMPARISON:  03/04/2022 FINDINGS: Brain: No acute intracranial findings are seen. There are no signs of bleeding within the cranium. Ventricles are not dilated. Cortical sulci are prominent. There is 9 x 7 mm dense calcification adjacent to the inner table of right anterior frontal calvarium close to midline with no significant change. There is no adjacent edema or mass effect. Vascular: Unremarkable. Skull: No fracture is seen. Sinuses/Orbits: Unremarkable. Other: Degenerative changes are noted in visualized upper cervical spine with posterior bony spurs causing extrinsic pressure over the ventral margin of thecal sac. IMPRESSION: No acute intracranial findings are seen in noncontrast CT brain.  Atrophy. There is 9 x 7 mm dense calcification in the right anterior frontal region in the meninges, possibly small meningioma or dystrophic calcification.  There is no adjacent edema or mass effect. Cervical spondylosis. Electronically Signed   By: Elmer Picker M.D.   On: 08/10/2022 17:41   DG Chest Port 1 View  Result Date: 08/10/2022 CLINICAL DATA:  weakness EXAM: PORTABLE CHEST 1 VIEW COMPARISON:  July 15, 2021 FINDINGS: The cardiomediastinal silhouette is unchanged in contour.Status post median sternotomy and CABG. Elevation of the RIGHT hemidiaphragm. Perihilar vascular fullness without overt edema. No pleural effusion. No pneumothorax. No acute pleuroparenchymal abnormality. IMPRESSION: Perihilar vascular fullness without overt pulmonary edema. Electronically Signed   By: Valentino Saxon M.D.   On: 08/10/2022 16:49   DG Knee Complete 4 Views Right  Result Date: 08/10/2022 CLINICAL DATA:  pain EXAM: RIGHT KNEE - COMPLETE 4 VIEW COMPARISON:  None Available. FINDINGS: No evidence of fracture, dislocation, or joint effusion. There is tricompartmental joint space narrowing, sclerosis and osteophytes consistent with degenerative joint disease. No evidence of effusion. IMPRESSION: Degenerative changes. Electronically Signed   By: Sammie Bench M.D.   On: 08/10/2022 09:05   DG Shoulder Left  Result Date: 08/10/2022 CLINICAL DATA:  Pain EXAM: LEFT SHOULDER-3 VIEW COMPARISON:  None Available. FINDINGS: Loss of acromial humeral distance consistent with underlying rotator cuff defect. Glenohumeral and acromioclavicular degenerative changes with osteophytes, subchondral cyst formation and joint space narrowing. No acute fracture, dislocation or subluxation identified. IMPRESSION: Degenerative changes.  Possible rotator cuff defect. Electronically Signed   By: Sammie Bench M.D.   On: 08/10/2022 09:04   DG Knee Complete 4 Views Left  Result Date: 08/10/2022 CLINICAL DATA:  pain EXAM: LEFT KNEE - COMPLETE 4 VIEW COMPARISON:  None Available. FINDINGS: No evidence of fracture, dislocation, or joint effusion. There is tricompartmental joint  space narrowing, sclerosis and osteophytes consistent with degenerative joint disease. No evidence of effusion. IMPRESSION: Degenerative changes. Electronically Signed   By: Sammie Bench M.D.   On: 08/10/2022 09:03        Scheduled Meds:  enoxaparin (LOVENOX) injection  40 mg Subcutaneous Q24H   Continuous Infusions:        Aline August, MD [facility] 08/11/2022, 10:33 AM

## 2022-08-11 NOTE — Progress Notes (Signed)
PT Cancellation Note  Patient Details Name: Erika Gates MRN: [id number] DOB: [birth year]   Cancelled Treatment:    Reason Eval/Treat Not Completed: Other (comment) Checked on pt earlier today and she was with MD.  Pt has neurosurgery consult as she was found to have cord compression in cervical spine.  Per neurosurgeon, patient has quadriparesis secondary to advanced spinal cord compromise at the C3-4,4-5, and 5-6 levels. Pt with several risk factors to surgery and is discussing with family if she wants to manage conservatively or with surgery. Will hold PT eval until plan of care determined.  Do feel that due to UE weakness , pt would also benefit from OT evaluation in addition to PT.    Anise Salvo, PT Acute Rehab [facility] [phone]   Rayetta Humphrey 08/11/2022, 5:30 PM

## 2022-08-12 ENCOUNTER — Telehealth: Payer: Self-pay | Admitting: Interventional Cardiology

## 2022-08-12 ENCOUNTER — Other Ambulatory Visit: Payer: Self-pay | Admitting: Neurological Surgery

## 2022-08-12 DIAGNOSIS — I10 Essential (primary) hypertension: Secondary | ICD-10-CM | POA: Diagnosis not present

## 2022-08-12 DIAGNOSIS — R29898 Other symptoms and signs involving the musculoskeletal system: Secondary | ICD-10-CM | POA: Diagnosis not present

## 2022-08-12 DIAGNOSIS — Z8679 Personal history of other diseases of the circulatory system: Secondary | ICD-10-CM | POA: Diagnosis not present

## 2022-08-12 LAB — CBC WITH DIFFERENTIAL/PLATELET
Abs Immature Granulocytes: 0.03 10*3/uL (ref 0.00–0.07)
Basophils Absolute: 0 10*3/uL (ref 0.0–0.1)
Basophils Relative: 0 %
Eosinophils Absolute: 0.1 10*3/uL (ref 0.0–0.5)
Eosinophils Relative: 1 %
HCT: 37 % (ref 36.0–46.0)
Hemoglobin: 11.7 g/dL — ABNORMAL LOW (ref 12.0–15.0)
Immature Granulocytes: 0 %
Lymphocytes Relative: 9 %
Lymphs Abs: 0.7 10*3/uL (ref 0.7–4.0)
MCH: 28.4 pg (ref 26.0–34.0)
MCHC: 31.6 g/dL (ref 30.0–36.0)
MCV: 89.8 fL (ref 80.0–100.0)
Monocytes Absolute: 0.7 10*3/uL (ref 0.1–1.0)
Monocytes Relative: 10 %
Neutro Abs: 5.9 10*3/uL (ref 1.7–7.7)
Neutrophils Relative %: 80 %
Platelets: 105 10*3/uL — ABNORMAL LOW (ref 150–400)
RBC: 4.12 MIL/uL (ref 3.87–5.11)
RDW: 15.8 % — ABNORMAL HIGH (ref 11.5–15.5)
WBC: 7.5 10*3/uL (ref 4.0–10.5)
nRBC: 0 % (ref 0.0–0.2)

## 2022-08-12 LAB — BASIC METABOLIC PANEL
Anion gap: 7 (ref 5–15)
BUN: 16 mg/dL (ref 8–23)
CO2: 26 mmol/L (ref 22–32)
Calcium: 10.1 mg/dL (ref 8.9–10.3)
Chloride: 107 mmol/L (ref 98–111)
Creatinine, Ser: 0.72 mg/dL (ref 0.44–1.00)
GFR, Estimated: >60 mL/min (ref >60)
Glucose, Bld: 121 mg/dL — ABNORMAL HIGH (ref 70–99)
Potassium: 4.1 mmol/L (ref 3.5–5.1)
Sodium: 140 mmol/L (ref 135–145)

## 2022-08-12 LAB — VITAMIN B12: Vitamin B-12: 307 pg/mL (ref 180–914)

## 2022-08-12 LAB — CYCLIC CITRUL PEPTIDE ANTIBODY, IGG/IGA: CCP Antibodies IgG/IgA: 2 units (ref 0–19)

## 2022-08-12 LAB — MAGNESIUM: Magnesium: 2.1 mg/dL (ref 1.7–2.4)

## 2022-08-12 LAB — RHEUMATOID FACTOR: Rheumatoid fact SerPl-aCnc: <10 IU/mL (ref <14.0)

## 2022-08-12 MED ORDER — ROSUVASTATIN CALCIUM 20 MG PO TABS
40.0000 mg | ORAL_TABLET | Freq: Every day | ORAL | Status: DC
  Administered 2022-08-12 – 2022-08-15 (×4): 40 mg via ORAL
  Filled 2022-08-12 (×4): qty 2

## 2022-08-12 MED ORDER — GABAPENTIN 300 MG PO CAPS
300.0000 mg | ORAL_CAPSULE | Freq: Every day | ORAL | Status: DC
  Administered 2022-08-12 – 2022-08-15 (×4): 300 mg via ORAL
  Filled 2022-08-12 (×4): qty 1

## 2022-08-12 MED ORDER — ALPRAZOLAM 0.5 MG PO TABS
0.5000 mg | ORAL_TABLET | Freq: Two times a day (BID) | ORAL | Status: DC
  Administered 2022-08-12 – 2022-08-15 (×7): 0.5 mg via ORAL
  Filled 2022-08-12 (×7): qty 1

## 2022-08-12 MED ORDER — CYCLOBENZAPRINE HCL 10 MG PO TABS
10.0000 mg | ORAL_TABLET | Freq: Two times a day (BID) | ORAL | Status: DC | PRN
  Administered 2022-08-12 – 2022-08-14 (×2): 10 mg via ORAL
  Filled 2022-08-12 (×2): qty 1

## 2022-08-12 MED ORDER — DAPAGLIFLOZIN PROPANEDIOL 10 MG PO TABS
10.0000 mg | ORAL_TABLET | Freq: Every day | ORAL | Status: DC
  Administered 2022-08-13 – 2022-08-15 (×3): 10 mg via ORAL
  Filled 2022-08-12 (×4): qty 1

## 2022-08-12 MED ORDER — AMLODIPINE BESYLATE 5 MG PO TABS
5.0000 mg | ORAL_TABLET | Freq: Every day | ORAL | Status: DC
  Administered 2022-08-12 – 2022-08-15 (×3): 5 mg via ORAL
  Filled 2022-08-12 (×3): qty 1

## 2022-08-12 MED ORDER — METOPROLOL SUCCINATE ER 50 MG PO TB24
100.0000 mg | ORAL_TABLET | Freq: Every day | ORAL | Status: DC
  Administered 2022-08-12 – 2022-08-15 (×3): 100 mg via ORAL
  Filled 2022-08-12: qty 1
  Filled 2022-08-12 (×2): qty 2

## 2022-08-12 NOTE — Progress Notes (Signed)
Inpatient Rehab Admissions Coordinator:   Per therapy recommendations, patient was screened for CIR candidacy by Megan Salon, MS, CCC-SLP ). At this time, Pt. is not yet at a level to tolerate the intensity of CIR. Additionally, there are upcoming plans for surgical intervention.   Pt. may have potential to progress to becoming a potential CIR candidate, so CIR admissions team will rescreen Pt. Post-op Please contact me with any questions.   Megan Salon, MS, CCC-SLP Rehab Admissions Coordinator  [phone] (celll) [phone] (office)

## 2022-08-12 NOTE — Telephone Encounter (Signed)
Daughter calling to let dr Katrinka Blazing know patient is in the hospital. And patient will needed a spinal surgery. Please advise

## 2022-08-12 NOTE — Progress Notes (Signed)
Occupational Therapy Evaluation Patient Details Name: Erika Gates MRN: [id number] DOB: 14-Aug-1935 Today's Date: 08/12/2022   History of Present Illness 87 year old admitted with progressive bilateral upper extremity and lower extremity weakness. MRI of the cervical spine demonstrates high-grade stenosis at C3-4 C4-5 and C5-6; C3-4 and 5 6 with evidence of cord compression. NSU consulted to assess surgical intervention. PMH: CAD s/p CABG, Anxiety, HTN, hyperlipidemia and morbid obesity   Clinical Impression   PTA pt lives at home with her grandson. Until @ 2 weeks ago, pt was modified independent with mobility and ADL @ rollator level. Pt has required increased assistance over the last 2 weeks with ADL and mobility due to increased weakness, especially in BUE (R weaker than L distally). Able to progress to EOB with Mod A and to chair with use of Stedy. Pt overall Max to total A with ADL due to BUE weakness. Daughter and pt state they have decided to have surgery with hopes of improved strength to achieve of of discharging back home with assistance of family. Pt is very motivated and will benefit from intensive rehab at [facility] to maximize functional level of independence with goal of returning home. Acute OT to follow.  Note - pt with MASD under pannus - L side. Interdry ordered. Please follow nursing care orders.      Recommendations for follow up therapy are one component of a multi-disciplinary discharge planning process, led by the attending physician.  Recommendations may be updated based on patient status, additional functional criteria and insurance authorization.   Follow Up Recommendations  Acute inpatient rehab (3hours/day)     Assistance Recommended at Discharge Frequent or constant Supervision/Assistance  Patient can return home with the following A lot of help with walking and/or transfers;A lot of help with bathing/dressing/bathroom;Assistance with feeding;Assistance with  cooking/housework;Assist for transportation;Help with stairs or ramp for entrance    Functional Status Assessment  Patient has had a recent decline in their functional status and demonstrates the ability to make significant improvements in function in a reasonable and predictable amount of time.  Equipment Recommendations  Tub/shower bench;Wheelchair (measurements OT);Wheelchair cushion (measurements OT);Hospital bed    Recommendations for Other Services Rehab consult     Precautions / Restrictions Precautions Precautions: Fall;Other (comment) (skin breakdown)      Mobility Bed Mobility Overal bed mobility: Needs Assistance Bed Mobility: Supine to Sit     Supine to sit: HOB elevated, Mod assist          Transfers Overall transfer level: Needs assistance Equipment used:  (stedy) Transfers: Sit to/from Stand Sit to Stand: From elevated surface           General transfer comment: mod A from bed with use of stedy; total A to place BUE up onto Stedy; min A from Stedy paddles      Balance Overall balance assessment: Needs assistance Sitting-balance support: Feet supported Sitting balance-Leahy Scale: Fair       Standing balance-Leahy Scale: Poor                             ADL either performed or assessed with clinical judgement   ADL Overall ADL's : Needs assistance/impaired Eating/Feeding: Moderate assistance Eating/Feeding Details (indicate cue type and reason): diffiulcty reaching mouth due to shoulder weakness; educated daughter how to prop elbows up on pillows to increase shoulder ROM/supoprt for self feeding; issued red tubing Grooming: Moderate assistance   Upper Body Bathing: Maximal assistance  Lower Body Bathing: Total assistance   Upper Body Dressing : Maximal assistance   Lower Body Dressing: Total assistance   Toilet Transfer: Maximal assistance (simulated with use of Stedy)   Toileting- Clothing Manipulation and Hygiene: Total  assistance       Functional mobility during ADLs: Maximal assistance (use of stedy; able to rise from stedy paddles with min A)       Vision Baseline Vision/History: 1 Wears glasses       Perception     Praxis      Pertinent Vitals/Pain Pain Assessment Pain Assessment: 0-10 Pain Score: 5  Pain Location: LUE Pain Descriptors / Indicators: Discomfort Pain Intervention(s): Limited activity within patient's tolerance     Hand Dominance Left   Extremity/Trunk Assessment Upper Extremity Assessment Upper Extremity Assessment: LUE deficits/detail;RUE deficits/detail RUE Deficits / Details: shoulder 2/5; elbow flexion 3+/5; ext 4/5; wrist 3+/5; gross grasp/release; more difficulty with in-hand manipulation skills; decreased abduction and extension of thumb; unable to oppose tips of digits; more neuropraxic with R hand LUE Deficits / Details: 1/5 shoulder; elbow flexion 3+/5, ext 4/5; wrist flex/ext 4/5; grip strength 4/5; able to oppose all digits; decreased in-hand manipulation however functional hand LUE Sensation: decreased light touch LUE Coordination: decreased gross motor;decreased fine motor   Lower Extremity Assessment Lower Extremity Assessment: Defer to PT evaluation   Cervical / Trunk Assessment Cervical / Trunk Assessment: Other exceptions (increased body habitus; hikes both shoulders)   Communication Communication Communication: No difficulties   Cognition Arousal/Alertness: Awake/alert Behavior During Therapy: WFL for tasks assessed/performed Overall Cognitive Status: Within Functional Limits for tasks assessed                                       General Comments  MASD under pannus; area cleaned and wicking pillow case placed until Interdry arrives - nsg made aware    Exercises Exercises: Other exercises, General Upper Extremity General Exercises - Upper Extremity Shoulder Flexion: AAROM, PROM, Both, 10 reps Elbow Flexion: AROM, AAROM,  Both, 10 reps Elbow Extension: AROM, AAROM, Both, 10 reps Digit Composite Flexion: AROM, Strengthening, Both, 10 reps Composite Extension: AROM, Both, 10 reps Other Exercises Other Exercises: chair marching x 10 Other Exercises: SAQ x 10   Shoulder Instructions      Home Living Family/patient expects to be discharged to:: Private residence Living Arrangements: Other relatives Available Help at Discharge: Available 24 hours/day;Family Type of Home: House Home Access: Stairs to enter Secretary/administrator of Steps: 4 Entrance Stairs-Rails: Right;Left;Can reach both Home Layout: One level     Bathroom Shower/Tub: Tub/shower unit;Walk-in shower   Bathroom Toilet: Handicapped height Bathroom Accessibility: Yes How Accessible: Accessible via walker Home Equipment: Rollator (4 wheels);Cane - single point;BSC/3in1;Hand held shower head          Prior Functioning/Environment Prior Level of Function : Independent/Modified Independent             Mobility Comments: using a rollator for @ 1 year; last fall in July ADLs Comments: for the last 2 weeks she has had assistance with dressing and bathing - pt was doing at least 50% of her ADL tasks; could still get up and walk however family would supervise; L knee buckled but pt did not fall        OT Problem List: Decreased strength;Decreased range of motion;Decreased activity tolerance;Impaired balance (sitting and/or standing);Decreased coordination;Decreased knowledge of use of DME or  AE;Impaired sensation;Obesity;Impaired UE functional use;Pain      OT Treatment/Interventions: Self-care/ADL training;Therapeutic exercise;Neuromuscular education;DME and/or AE instruction;Therapeutic activities;Patient/family education;Balance training    OT Goals(Current goals can be found in the care plan section) Acute Rehab OT Goals Patient Stated Goal: to get stronger OT Goal Formulation: With patient/family Time For Goal Achievement:  08/26/22 Potential to Achieve Goals: Good  OT Frequency: Min 2X/week    Co-evaluation              AM-PAC OT "6 Clicks" Daily Activity     Outcome Measure Help from another person eating meals?: A Lot Help from another person taking care of personal grooming?: A Lot Help from another person toileting, which includes using toliet, bedpan, or urinal?: Total Help from another person bathing (including washing, rinsing, drying)?: A Lot Help from another person to put on and taking off regular upper body clothing?: A Lot Help from another person to put on and taking off regular lower body clothing?: Total 6 Click Score: 10   End of Session Equipment Utilized During Treatment: Gait belt Nurse Communication: Mobility status;Need for lift equipment;Other (comment) (need for Interdry under pannus)  Activity Tolerance: Patient tolerated treatment well Patient left: in chair;with call bell/phone within reach;with family/visitor present  OT Visit Diagnosis: Unsteadiness on feet (R26.81);Other abnormalities of gait and mobility (R26.89);Muscle weakness (generalized) (M62.81);Pain Pain - Right/Left: Left Pain - part of body: Shoulder;Arm                Time: 0815-0910 OT Time Calculation (min): 55 min Charges:  OT General Charges $OT Visit: 1 Visit OT Evaluation $OT Eval Moderate Complexity: 1 Mod OT Treatments $Self Care/Home Management : 8-22 mins $Therapeutic Exercise: 8-22 mins  Luisa Dago, OT/L   Acute OT Clinical Specialist Acute Rehabilitation Services Pager [phone] Office [phone]   [facility] 08/12/2022, 9:35 AM

## 2022-08-12 NOTE — Plan of Care (Signed)
  Problem: Clinical Measurements: Goal: Will remain free from infection Outcome: Progressing   Problem: Nutrition: Goal: Adequate nutrition will be maintained Outcome: Progressing   Problem: Activity: Goal: Risk for activity intolerance will decrease Outcome: Progressing   

## 2022-08-12 NOTE — Progress Notes (Signed)
PROGRESS NOTE    Erika Gates  [id number] DOB: 03-Feb-1935 DOA: 08/10/2022 PCP: Donald Prose, MD   Brief Narrative:  87 y.o. female with medical history significant of CAD s/p CABG, hypertension, hyperlipidemia and morbid obesity presented with worsening upper and lower extremity weakness.  On presentation, CT head showed possible meningioma but otherwise no acute findings.  Bilateral knee x-ray with no acute fractures, dislocation or effusions.  Left shoulder x-ray showed findings consistent with rotator cuff defect.  MRI of cervical spine was ordered.  Assessment & Plan:   Cervical spine stenosis Bilateral upper extremity weakness -MRI of cervical spine showed moderate to severe spinal canal stenosis at various levels.  Neurosurgery following: Patient has decided to proceed with  surgical intervention.  Timing to be decided by neurosurgery -PT eval pending. -Fall precautions  Bilateral lower extremity weakness -Possibly from worsening osteoarthritis.  Has had injections to bilateral knees by Dr. Deveshwar/rheumatology -PT eval.  Outpatient follow-up with Dr. Estanislado Pandy -Bilateral knee x-ray showed degenerative changes  History of CAD status post CABG Hyperlipidemia -Stable.  No chest pain. -Continue statin and metoprolol.  Hold aspirin  Essential hypertension -Resume home amlodipine and metoprolol.  If blood pressure remains an issue, will resume lisinopril and Lasix as well.  Thrombocytopenia -Questionable cause.  Monitor  Normocytic anemia -Possibly from chronic illnesses.  No signs of bleeding.  Monitor intermittently.  Morbid obesity -Outpatient follow-up  DVT prophylaxis: Lovenox Code Status: Full Family Communication: Daughter at bedside Disposition Plan: Status is: Observation The patient will require care spanning > 2 midnights and should be moved to inpatient because: Need for PT eval and neurosurgery evaluation    Consultants:  Neurosurgery  Procedures: None  Antimicrobials: None   Subjective: Patient seen and examined at bedside.  No chest pain, worsening shortness of breath reported.  Still complains of bilateral upper extremity weakness and leg weakness.  Objective: Vitals:   08/11/22 1722 08/11/22 2051 08/12/22 0345 08/12/22 0827  BP: (!) 159/71 (!) 156/79 137/67 (!) 165/68  Pulse: 71 71 93 95  Resp: 15  16 15   Temp: 98.2 F (36.8 C) 97.6 F (36.4 C) 98 F (36.7 C) 98.1 F (36.7 C)  TempSrc: Oral Oral Oral Oral  SpO2: 97% 100% 99% 100%   No intake or output data in the 24 hours ending 08/12/22 0828 There were no vitals filed for this visit.  Examination:  General: On room air.  No distress.  Chronically ill and deconditioned.  Elderly female lying in bed.  Currently on room air.  Slow to respond. respiratory: Decreased breath sounds at bases bilaterally with some crackles CVS: Currently rate controlled; S1-S2 heard  abdominal: Soft, nontender, slightly distended, no organomegaly; bowel sounds are heard  extremities: Trace lower extremity edema; no clubbing.       Data Reviewed: I have personally reviewed following labs and imaging studies  CBC: Recent Labs  Lab 08/10/22 1649 08/11/22 0143 08/12/22 0257  WBC 4.9 5.4 7.5  NEUTROABS 2.8  --  5.9  HGB 11.2* 11.4* 11.7*  HCT 38.7 38.0 37.0  MCV 95.8 93.6 89.8  PLT 119* 107* 105*    Basic Metabolic Panel: Recent Labs  Lab 08/10/22 1649 08/12/22 0257  NA 145 140  K 4.7 4.1  CL 111 107  CO2 26 26  GLUCOSE 82 121*  BUN 29* 16  CREATININE 0.90 0.72  CALCIUM 10.0 10.1  MG  --  2.1    GFR: CrCl cannot be calculated (Unknown ideal weight.). Liver Function  Tests: Recent Labs  Lab 08/10/22 1649  AST 30  ALT 24  ALKPHOS 81  BILITOT 0.5  PROT 6.6  ALBUMIN 3.2*    No results for input(s): "LIPASE", "AMYLASE" in the last 168 hours. No results for input(s): "AMMONIA" in the last 168 hours. Coagulation Profile: No  results for input(s): "INR", "PROTIME" in the last 168 hours. Cardiac Enzymes: Recent Labs  Lab 08/10/22 1649  CKTOTAL 88    BNP (last 3 results) No results for input(s): "PROBNP" in the last 8760 hours. HbA1C: No results for input(s): "HGBA1C" in the last 72 hours. CBG: No results for input(s): "GLUCAP" in the last 168 hours. Lipid Profile: No results for input(s): "CHOL", "HDL", "LDLCALC", "TRIG", "CHOLHDL", "LDLDIRECT" in the last 72 hours. Thyroid Function Tests: Recent Labs    08/10/22 1704  TSH 1.783    Anemia Panel: Recent Labs    08/12/22 0257  VITAMINB12 307   Sepsis Labs: Recent Labs  Lab 08/10/22 1649  LATICACIDVEN 0.9     Recent Results (from the past 240 hour(s))  Resp panel by RT-PCR (RSV, Flu A&B, Covid) Anterior Nasal Swab     Status: None   Collection Time: 08/10/22  6:18 AM   Specimen: Anterior Nasal Swab  Result Value Ref Range Status   SARS Coronavirus 2 by RT PCR NEGATIVE NEGATIVE Final    Comment: (NOTE) SARS-CoV-2 target nucleic acids are NOT DETECTED.  The SARS-CoV-2 RNA is generally detectable in upper respiratory specimens during the acute phase of infection. The lowest concentration of SARS-CoV-2 viral copies this assay can detect is 138 copies/mL. A negative result does not preclude SARS-Cov-2 infection and should not be used as the sole basis for treatment or other patient management decisions. A negative result may occur with  improper specimen collection/handling, submission of specimen other than nasopharyngeal swab, presence of viral mutation(s) within the areas targeted by this assay, and inadequate number of viral copies(<138 copies/mL). A negative result must be combined with clinical observations, patient history, and epidemiological information. The expected result is Negative.  Fact Sheet for Patients:  EntrepreneurPulse.com.au  Fact Sheet for Healthcare Providers:   IncredibleEmployment.be  This test is no t yet approved or cleared by the [place] FDA and  has been authorized for detection and/or diagnosis of SARS-CoV-2 by FDA under an Emergency Use Authorization (EUA). This EUA will remain  in effect (meaning this test can be used) for the duration of the COVID-19 declaration under Section 564(b)(1) of the Act, 21 U.S.C.section 360bbb-3(b)(1), unless the authorization is terminated  or revoked sooner.       Influenza A by PCR NEGATIVE NEGATIVE Final   Influenza B by PCR NEGATIVE NEGATIVE Final    Comment: (NOTE) The Xpert Xpress SARS-CoV-2/FLU/RSV plus assay is intended as an aid in the diagnosis of influenza from Nasopharyngeal swab specimens and should not be used as a sole basis for treatment. Nasal washings and aspirates are unacceptable for Xpert Xpress SARS-CoV-2/FLU/RSV testing.  Fact Sheet for Patients: EntrepreneurPulse.com.au  Fact Sheet for Healthcare Providers: IncredibleEmployment.be  This test is not yet approved or cleared by the [place] FDA and has been authorized for detection and/or diagnosis of SARS-CoV-2 by FDA under an Emergency Use Authorization (EUA). This EUA will remain in effect (meaning this test can be used) for the duration of the COVID-19 declaration under Section 564(b)(1) of the Act, 21 U.S.C. section 360bbb-3(b)(1), unless the authorization is terminated or revoked.     Resp Syncytial Virus by PCR NEGATIVE  NEGATIVE Final    Comment: (NOTE) Fact Sheet for Patients: EntrepreneurPulse.com.au  Fact Sheet for Healthcare Providers: IncredibleEmployment.be  This test is not yet approved or cleared by the [place] FDA and has been authorized for detection and/or diagnosis of SARS-CoV-2 by FDA under an Emergency Use Authorization (EUA). This EUA will remain in effect (meaning this test can be used) for  the duration of the COVID-19 declaration under Section 564(b)(1) of the Act, 21 U.S.C. section 360bbb-3(b)(1), unless the authorization is terminated or revoked.  Performed at [facility], [place] [address] [place], [place] [address]          Radiology Studies: MR CERVICAL SPINE WO CONTRAST  Result Date: 08/10/2022 CLINICAL DATA:  Bilateral upper and lower extremity weakness EXAM: MRI CERVICAL SPINE WITHOUT CONTRAST TECHNIQUE: Multiplanar, multisequence MR imaging of the cervical spine was performed. No intravenous contrast was administered. COMPARISON:  No prior MRI of the cervical spine, correlation is made with CT cervical spine 03/04/2022 FINDINGS: Alignment: Reversal of the normal cervical lordosis. 2 mm retrolisthesis C3 on C4. 4 mm anterolisthesis of C5 on C6. 2 mm anterolisthesis of C7 on T1. Vertebrae: No fracture, evidence of discitis, or bone lesion. Endplate degenerative changes, most prominent at C6-C7. Cord: Normal spinal cord signal.  Cord flattening at C3-C4. Posterior Fossa, vertebral arteries, paraspinal tissues: Empty sella. Normal vertebral artery flow voids. No acute finding in the paraspinal tissues. Disc levels: C2-C3: No significant disc bulge. Right greater than left facet arthropathy. No spinal canal stenosis or neural foraminal narrowing. C3-C4: Trace retrolisthesis and mild disc bulge. Facet and uncovertebral hypertrophy. Severe spinal canal stenosis with flattening of the spinal cord. Mild to moderate bilateral neural foraminal narrowing. C4-C5: Mild disc bulge with left paracentral disc extrusion with 5 mm of caudal migration, which indents and deforms the ventral spinal cord below the disc space. Mild spinal canal stenosis. Mild-to-moderate bilateral neural foraminal narrowing. C5-C6: Mild anterolisthesis with disc unroofing. Uncovertebral and facet arthropathy. Moderate spinal canal stenosis with some milder cord flattening. Mild bilateral neural foraminal  narrowing. C6-C7: Disc height loss with minimal disc bulge. Facet and uncovertebral hypertrophy. Mild spinal canal stenosis. No neural foraminal narrowing. C7-T1: No significant disc bulge. No spinal canal stenosis or neuroforaminal narrowing. IMPRESSION: 1. C3-C4 severe spinal canal stenosis and mild-to-moderate bilateral neural foraminal narrowing. There is cord flattening at this level, without abnormal cord signal. 2. C4-C5 mild spinal canal stenosis and mild-to-moderate bilateral neural foraminal narrowing. 3. C5-C6 moderate spinal canal stenosis and mild bilateral neural foraminal narrowing. 4. C6-C7 mild spinal canal stenosis. Electronically Signed   By: Merilyn Baba M.D.   On: 08/10/2022 23:43   CT Head Wo Contrast  Result Date: 08/10/2022 CLINICAL DATA:  Altered mental status EXAM: CT HEAD WITHOUT CONTRAST TECHNIQUE: Contiguous axial images were obtained from the base of the skull through the vertex without intravenous contrast. RADIATION DOSE REDUCTION: This exam was performed according to the departmental dose-optimization program which includes automated exposure control, adjustment of the mA and/or kV according to patient size and/or use of iterative reconstruction technique. COMPARISON:  03/04/2022 FINDINGS: Brain: No acute intracranial findings are seen. There are no signs of bleeding within the cranium. Ventricles are not dilated. Cortical sulci are prominent. There is 9 x 7 mm dense calcification adjacent to the inner table of right anterior frontal calvarium close to midline with no significant change. There is no adjacent edema or mass effect. Vascular: Unremarkable. Skull: No fracture is seen. Sinuses/Orbits: Unremarkable. Other: Degenerative changes  are noted in visualized upper cervical spine with posterior bony spurs causing extrinsic pressure over the ventral margin of thecal sac. IMPRESSION: No acute intracranial findings are seen in noncontrast CT brain. Atrophy. There is 9 x 7 mm  dense calcification in the right anterior frontal region in the meninges, possibly small meningioma or dystrophic calcification. There is no adjacent edema or mass effect. Cervical spondylosis. Electronically Signed   By: Ernie Avena M.D.   On: 08/10/2022 17:41   DG Chest Port 1 View  Result Date: 08/10/2022 CLINICAL DATA:  weakness EXAM: PORTABLE CHEST 1 VIEW COMPARISON:  July 15, 2021 FINDINGS: The cardiomediastinal silhouette is unchanged in contour.Status post median sternotomy and CABG. Elevation of the RIGHT hemidiaphragm. Perihilar vascular fullness without overt edema. No pleural effusion. No pneumothorax. No acute pleuroparenchymal abnormality. IMPRESSION: Perihilar vascular fullness without overt pulmonary edema. Electronically Signed   By: Meda Klinefelter M.D.   On: 08/10/2022 16:49   DG Knee Complete 4 Views Right  Result Date: 08/10/2022 CLINICAL DATA:  pain EXAM: RIGHT KNEE - COMPLETE 4 VIEW COMPARISON:  None Available. FINDINGS: No evidence of fracture, dislocation, or joint effusion. There is tricompartmental joint space narrowing, sclerosis and osteophytes consistent with degenerative joint disease. No evidence of effusion. IMPRESSION: Degenerative changes. Electronically Signed   By: Layla Maw M.D.   On: 08/10/2022 09:05   DG Shoulder Left  Result Date: 08/10/2022 CLINICAL DATA:  Pain EXAM: LEFT SHOULDER-3 VIEW COMPARISON:  None Available. FINDINGS: Loss of acromial humeral distance consistent with underlying rotator cuff defect. Glenohumeral and acromioclavicular degenerative changes with osteophytes, subchondral cyst formation and joint space narrowing. No acute fracture, dislocation or subluxation identified. IMPRESSION: Degenerative changes.  Possible rotator cuff defect. Electronically Signed   By: Layla Maw M.D.   On: 08/10/2022 09:04   DG Knee Complete 4 Views Left  Result Date: 08/10/2022 CLINICAL DATA:  pain EXAM: LEFT KNEE - COMPLETE 4 VIEW  COMPARISON:  None Available. FINDINGS: No evidence of fracture, dislocation, or joint effusion. There is tricompartmental joint space narrowing, sclerosis and osteophytes consistent with degenerative joint disease. No evidence of effusion. IMPRESSION: Degenerative changes. Electronically Signed   By: Layla Maw M.D.   On: 08/10/2022 09:03        Scheduled Meds:  enoxaparin (LOVENOX) injection  40 mg Subcutaneous Q24H   folic acid  1 mg Oral Daily   Continuous Infusions:        Glade Lloyd, MD [facility] 08/12/2022, 8:28 AM

## 2022-08-12 NOTE — Evaluation (Signed)
Physical Therapy Evaluation  Patient Details Name: Erika Gates MRN: [id number] DOB: Jul 15, 1935 Today's Date: 08/12/2022  History of Present Illness  Pt is an 87 year old admitted with progressive bilateral upper extremity and lower extremity weakness. MRI of the cervical spine demonstrates high-grade stenosis at C3-4 C4-5 and C5-6; C3-4 and 5 6 with evidence of cord compression. NSU consulted to assess surgical intervention. PMH: CAD s/p CABG, Anxiety, HTN, hyperlipidemia and morbid obesity   Clinical Impression  Pt admitted with above diagnosis. Pt currently with functional limitations due to the deficits listed below (see PT Problem List). At the time of PT eval pt was able to perform transfers to EOB with +1 max assist, and back to supine with +2 assist. Increased time taken to reposition pt in bed with small pillow under R flank, pillow under BUE's, and heels floated. Dermatherapy sheet placed in pannus fold. Recommending AIR level rehab to maximize functional independence and safety, and to facilitate return home with family support after anticipated cervical surgery. Acutely, pt will benefit from skilled PT to increase their independence and safety with mobility to allow discharge to the venue listed below.          Recommendations for follow up therapy are one component of a multi-disciplinary discharge planning process, led by the attending physician.  Recommendations may be updated based on patient status, additional functional criteria and insurance authorization.  Follow Up Recommendations Acute inpatient rehab (3hours/day)      Assistance Recommended at Discharge Frequent or constant Supervision/Assistance  Patient can return home with the following  Two people to help with walking and/or transfers;A lot of help with bathing/dressing/bathroom;Assistance with cooking/housework;Assist for transportation;Help with stairs or ramp for entrance    Equipment Recommendations Rolling  walker (2 wheels)  Recommendations for Other Services       Functional Status Assessment Patient has had a recent decline in their functional status and demonstrates the ability to make significant improvements in function in a reasonable and predictable amount of time.     Precautions / Restrictions Precautions Precautions: Fall;Other (comment) (skin breakdown) Precaution Comments: Interdry ordered after OT eval for pannus folds.      Mobility  Bed Mobility Overal bed mobility: Needs Assistance Bed Mobility: Rolling, Sidelying to Sit, Sit to Sidelying Rolling: Max assist Sidelying to sit: Max assist     Sit to sidelying: Mod assist, +2 for physical assistance General bed mobility comments: Bed pad utilized for rolling and max assist required for transition to sitting. +2 assist required for return to supine at end of session.    Transfers                   General transfer comment: Unable to progress to OOB mobility at this time. Pt with difficulty getting balance at EOB, scooting out (feet unable to get to floor), and pt reports increased pain in her knees.    Ambulation/Gait                  Stairs            Wheelchair Mobility    Modified Rankin (Stroke Patients Only)       Balance Overall balance assessment: Needs assistance Sitting-balance support: Feet unsupported, Bilateral upper extremity supported Sitting balance-Leahy Scale: Poor Sitting balance - Comments: Almost constant assist required to maintain sitting balance at EOB.     Standing balance-Leahy Scale: Poor  Pertinent Vitals/Pain Pain Assessment Pain Assessment: Faces Faces Pain Scale: Hurts even more Pain Location: LUE, B knees Pain Descriptors / Indicators: Discomfort, Grimacing Pain Intervention(s): Limited activity within patient's tolerance, Monitored during session, Repositioned    Home Living Family/patient expects to be  discharged to:: Private residence Living Arrangements: Other relatives Available Help at Discharge: Available 24 hours/day;Family Type of Home: House Home Access: Stairs to enter Entrance Stairs-Rails: Right;Left;Can reach both Entrance Stairs-Number of Steps: 4   Home Layout: One level Home Equipment: Rollator (4 wheels);Cane - single point;BSC/3in1;Hand held shower head      Prior Function Prior Level of Function : Independent/Modified Independent             Mobility Comments: using a rollator for ~1 year; last fall in July ADLs Comments: for the last 2 weeks she has had assistance with dressing and bathing - pt was doing at least 50% of her ADL tasks; could still get up and walk however family would supervise; L knee buckled but pt did not fall     Hand Dominance   Dominant Hand: Left    Extremity/Trunk Assessment   Upper Extremity Assessment Upper Extremity Assessment: Defer to OT evaluation    Lower Extremity Assessment Lower Extremity Assessment: Generalized weakness (B feet appear externally rotated at the ankles with decreased arch.)    Cervical / Trunk Assessment Cervical / Trunk Assessment: Other exceptions (increased body habitus; hikes both shoulders)  Communication   Communication: No difficulties  Cognition Arousal/Alertness: Awake/alert Behavior During Therapy: WFL for tasks assessed/performed Overall Cognitive Status: Within Functional Limits for tasks assessed                                          General Comments      Exercises     Assessment/Plan    PT Assessment Patient needs continued PT services  PT Problem List Decreased strength;Decreased range of motion;Decreased activity tolerance;Decreased balance;Decreased mobility;Decreased knowledge of use of DME;Decreased safety awareness;Decreased knowledge of precautions;Pain;Decreased coordination       PT Treatment Interventions DME instruction;Gait training;Stair  training;Functional mobility training;Therapeutic activities;Therapeutic exercise;Balance training;Patient/family education    PT Goals (Current goals can be found in the Care Plan section)  Acute Rehab PT Goals Patient Stated Goal: Be able to return to her home PT Goal Formulation: With patient Time For Goal Achievement: 08/26/22 Potential to Achieve Goals: Fair    Frequency Min 3X/week     Co-evaluation               AM-PAC PT "6 Clicks" Mobility  Outcome Measure Help needed turning from your back to your side while in a flat bed without using bedrails?: A Lot Help needed moving from lying on your back to sitting on the side of a flat bed without using bedrails?: A Lot Help needed moving to and from a bed to a chair (including a wheelchair)?: Total Help needed standing up from a chair using your arms (e.g., wheelchair or bedside chair)?: Total Help needed to walk in hospital room?: Total   6 Click Score: 7    End of Session   Activity Tolerance: Patient limited by fatigue Patient left: in bed;with call bell/phone within reach;with bed alarm set Nurse Communication: Mobility status PT Visit Diagnosis: Unsteadiness on feet (R26.81);Pain;Muscle weakness (generalized) (M62.81);Other symptoms and signs involving the nervous system (R29.898) Pain - part of body: Shoulder;Knee  Time: 1430-1456 PT Time Calculation (min) (ACUTE ONLY): 26 min   Charges:   PT Evaluation $PT Eval Moderate Complexity: 1 Mod PT Treatments $Therapeutic Activity: 8-22 mins        Rolinda Roan, PT, DPT Acute Rehabilitation Services Secure Chat Preferred Office: [phone]   Thelma Comp 08/12/2022, 3:14 PM

## 2022-08-13 DIAGNOSIS — I4819 Other persistent atrial fibrillation: Secondary | ICD-10-CM | POA: Diagnosis not present

## 2022-08-13 DIAGNOSIS — M5001 Cervical disc disorder with myelopathy,  high cervical region: Secondary | ICD-10-CM | POA: Diagnosis present

## 2022-08-13 DIAGNOSIS — I4891 Unspecified atrial fibrillation: Secondary | ICD-10-CM | POA: Diagnosis not present

## 2022-08-13 DIAGNOSIS — R7989 Other specified abnormal findings of blood chemistry: Secondary | ICD-10-CM | POA: Diagnosis not present

## 2022-08-13 DIAGNOSIS — E860 Dehydration: Secondary | ICD-10-CM | POA: Diagnosis not present

## 2022-08-13 DIAGNOSIS — E876 Hypokalemia: Secondary | ICD-10-CM | POA: Diagnosis not present

## 2022-08-13 DIAGNOSIS — L89326 Pressure-induced deep tissue damage of left buttock: Secondary | ICD-10-CM | POA: Diagnosis not present

## 2022-08-13 DIAGNOSIS — F419 Anxiety disorder, unspecified: Secondary | ICD-10-CM | POA: Diagnosis not present

## 2022-08-13 DIAGNOSIS — E7800 Pure hypercholesterolemia, unspecified: Secondary | ICD-10-CM | POA: Diagnosis present

## 2022-08-13 DIAGNOSIS — G8250 Quadriplegia, unspecified: Secondary | ICD-10-CM | POA: Diagnosis present

## 2022-08-13 DIAGNOSIS — E11649 Type 2 diabetes mellitus with hypoglycemia without coma: Secondary | ICD-10-CM | POA: Diagnosis not present

## 2022-08-13 DIAGNOSIS — I10 Essential (primary) hypertension: Secondary | ICD-10-CM | POA: Diagnosis not present

## 2022-08-13 DIAGNOSIS — L89156 Pressure-induced deep tissue damage of sacral region: Secondary | ICD-10-CM | POA: Diagnosis not present

## 2022-08-13 DIAGNOSIS — R29898 Other symptoms and signs involving the musculoskeletal system: Secondary | ICD-10-CM | POA: Diagnosis not present

## 2022-08-13 DIAGNOSIS — I468 Cardiac arrest due to other underlying condition: Secondary | ICD-10-CM | POA: Diagnosis not present

## 2022-08-13 DIAGNOSIS — M4802 Spinal stenosis, cervical region: Secondary | ICD-10-CM | POA: Diagnosis present

## 2022-08-13 DIAGNOSIS — R531 Weakness: Secondary | ICD-10-CM | POA: Diagnosis present

## 2022-08-13 DIAGNOSIS — I110 Hypertensive heart disease with heart failure: Secondary | ICD-10-CM | POA: Diagnosis present

## 2022-08-13 DIAGNOSIS — I469 Cardiac arrest, cause unspecified: Secondary | ICD-10-CM | POA: Diagnosis not present

## 2022-08-13 DIAGNOSIS — N179 Acute kidney failure, unspecified: Secondary | ICD-10-CM | POA: Diagnosis not present

## 2022-08-13 DIAGNOSIS — I2510 Atherosclerotic heart disease of native coronary artery without angina pectoris: Secondary | ICD-10-CM | POA: Diagnosis not present

## 2022-08-13 DIAGNOSIS — D6869 Other thrombophilia: Secondary | ICD-10-CM | POA: Diagnosis present

## 2022-08-13 DIAGNOSIS — Z1152 Encounter for screening for COVID-19: Secondary | ICD-10-CM | POA: Diagnosis not present

## 2022-08-13 DIAGNOSIS — D696 Thrombocytopenia, unspecified: Secondary | ICD-10-CM | POA: Diagnosis not present

## 2022-08-13 DIAGNOSIS — E875 Hyperkalemia: Secondary | ICD-10-CM | POA: Diagnosis not present

## 2022-08-13 DIAGNOSIS — D649 Anemia, unspecified: Secondary | ICD-10-CM | POA: Diagnosis present

## 2022-08-13 DIAGNOSIS — I2583 Coronary atherosclerosis due to lipid rich plaque: Secondary | ICD-10-CM | POA: Diagnosis not present

## 2022-08-13 DIAGNOSIS — M4712 Other spondylosis with myelopathy, cervical region: Secondary | ICD-10-CM | POA: Diagnosis present

## 2022-08-13 DIAGNOSIS — I272 Pulmonary hypertension, unspecified: Secondary | ICD-10-CM | POA: Diagnosis present

## 2022-08-13 DIAGNOSIS — E872 Acidosis, unspecified: Secondary | ICD-10-CM | POA: Diagnosis not present

## 2022-08-13 DIAGNOSIS — I5032 Chronic diastolic (congestive) heart failure: Secondary | ICD-10-CM | POA: Diagnosis present

## 2022-08-13 NOTE — Progress Notes (Signed)
Patient ID: Erika Gates, female   DOB: 03/01/1935, 87 y.o.   MRN: [id number] Vital signs are stable Patient's neuroexam remains unchanged with substantial weakness in the upper extremities particularly with grips at 4- out of 5 Has not been ambulatory After careful consideration I discussed with the family and we are planning to do surgical decompression of the cervical spine at C3-4 C4-5 and C5-6 tomorrow morning.  I spent some time talking to the patient and her daughter who was present answering questions.  Surgery scheduled for 7:30 in the morning and hopeful that we will see some improvement in her level of function and rehab potential thereafter. 

## 2022-08-13 NOTE — Progress Notes (Addendum)
PROGRESS NOTE    Erika Gates  [id number] DOB: 07-22-1935 DOA: 08/10/2022 PCP: Donald Prose, MD   Brief Narrative:  87 y.o. female with medical history significant of CAD s/p CABG, hypertension, hyperlipidemia and morbid obesity presented with worsening upper and lower extremity weakness.  On presentation, CT head showed possible meningioma but otherwise no acute findings.  Bilateral knee x-ray with no acute fractures, dislocation or effusions.  Left shoulder x-ray showed findings consistent with rotator cuff defect.  MRI of cervical spine was ordered.  Assessment & Plan:   Cervical spine stenosis Bilateral upper extremity weakness -MRI of cervical spine showed moderate to severe spinal canal stenosis at various levels.  Neurosurgery following: Patient has decided to proceed with  surgical intervention.  Possible surgical intervention on 08/14/2022. -PT recommends CIR placement. -Fall precautions  Bilateral lower extremity weakness -Possibly from worsening osteoarthritis.  Has had injections to bilateral knees by Dr. Deveshwar/rheumatology -Outpatient follow-up with Dr. Estanislado Pandy -Bilateral knee x-ray showed degenerative changes  History of CAD status post CABG Hyperlipidemia -Stable.  No chest pain. -Continue statin and metoprolol.  Hold aspirin  Essential hypertension -Resumed home amlodipine and metoprolol on 08/12/2022.  Blood pressure stable.  If blood pressure remains an issue, will resume lisinopril and Lasix as well.  Thrombocytopenia -Questionable cause.  Monitor intermittently.  No labs today.  Normocytic anemia -Possibly from chronic illnesses.  No signs of bleeding.  Monitor intermittently.  No labs today  Morbid obesity -Outpatient follow-up  DVT prophylaxis: Hold Lovenox for need for surgical intervention tomorrow Code Status: Full Family Communication: Daughter at bedside  Disposition Plan: Status is: Observation The patient will require care  spanning > 2 midnights and should be moved to inpatient because: Need for PT eval and neurosurgery evaluation    Consultants: Neurosurgery  Procedures: None  Antimicrobials: None   Subjective: Patient seen and examined at bedside.  No fever, chest pain or worsening shortness of breath reported.  Still complains of extremities weakness. Objective: Vitals:   08/12/22 1531 08/12/22 1954 08/13/22 0418 08/13/22 0758  BP: (!) 157/61 (!) 126/51 (!) 132/57 (!) 141/56  Pulse: 84 87 79 81  Resp: 15 17 15 16   Temp: 98.3 F (36.8 C) 98.5 F (36.9 C) 98.9 F (37.2 C) 98.5 F (36.9 C)  TempSrc: Oral Oral Oral Oral  SpO2: 99% 96% 95% 97%   No intake or output data in the 24 hours ending 08/13/22 0758 There were no vitals filed for this visit.  Examination:  General: Currently in no acute distress and on room air.  Chronically ill and deconditioned.  Elderly female lying in bed.  Still slow to respond.   Respiratory: Bilateral decreased breath sounds at bases with some crackles CVS: S1 and S2 are heard; rate mostly controlled abdominal: Soft, nontender, distended slightly; no organomegaly; normal bowel sounds heard Extremities: No cyanosis; mild lower extremity edema present     Data Reviewed: I have personally reviewed following labs and imaging studies  CBC: Recent Labs  Lab 08/10/22 1649 08/11/22 0143 08/12/22 0257  WBC 4.9 5.4 7.5  NEUTROABS 2.8  --  5.9  HGB 11.2* 11.4* 11.7*  HCT 38.7 38.0 37.0  MCV 95.8 93.6 89.8  PLT 119* 107* 105*    Basic Metabolic Panel: Recent Labs  Lab 08/10/22 1649 08/12/22 0257  NA 145 140  K 4.7 4.1  CL 111 107  CO2 26 26  GLUCOSE 82 121*  BUN 29* 16  CREATININE 0.90 0.72  CALCIUM 10.0 10.1  MG  --  2.1    GFR: CrCl cannot be calculated (Unknown ideal weight.). Liver Function Tests: Recent Labs  Lab 08/10/22 1649  AST 30  ALT 24  ALKPHOS 81  BILITOT 0.5  PROT 6.6  ALBUMIN 3.2*    No results for input(s): "LIPASE",  "AMYLASE" in the last 168 hours. No results for input(s): "AMMONIA" in the last 168 hours. Coagulation Profile: No results for input(s): "INR", "PROTIME" in the last 168 hours. Cardiac Enzymes: Recent Labs  Lab 08/10/22 1649  CKTOTAL 88    BNP (last 3 results) No results for input(s): "PROBNP" in the last 8760 hours. HbA1C: No results for input(s): "HGBA1C" in the last 72 hours. CBG: No results for input(s): "GLUCAP" in the last 168 hours. Lipid Profile: No results for input(s): "CHOL", "HDL", "LDLCALC", "TRIG", "CHOLHDL", "LDLDIRECT" in the last 72 hours. Thyroid Function Tests: Recent Labs    08/10/22 1704  TSH 1.783    Anemia Panel: Recent Labs    08/12/22 0257  VITAMINB12 307    Sepsis Labs: Recent Labs  Lab 08/10/22 1649  LATICACIDVEN 0.9     Recent Results (from the past 240 hour(s))  Resp panel by RT-PCR (RSV, Flu A&B, Covid) Anterior Nasal Swab     Status: None   Collection Time: 08/10/22  6:18 AM   Specimen: Anterior Nasal Swab  Result Value Ref Range Status   SARS Coronavirus 2 by RT PCR NEGATIVE NEGATIVE Final    Comment: (NOTE) SARS-CoV-2 target nucleic acids are NOT DETECTED.  The SARS-CoV-2 RNA is generally detectable in upper respiratory specimens during the acute phase of infection. The lowest concentration of SARS-CoV-2 viral copies this assay can detect is 138 copies/mL. A negative result does not preclude SARS-Cov-2 infection and should not be used as the sole basis for treatment or other patient management decisions. A negative result may occur with  improper specimen collection/handling, submission of specimen other than nasopharyngeal swab, presence of viral mutation(s) within the areas targeted by this assay, and inadequate number of viral copies(<138 copies/mL). A negative result must be combined with clinical observations, patient history, and epidemiological information. The expected result is Negative.  Fact Sheet for Patients:   EntrepreneurPulse.com.au  Fact Sheet for Healthcare Providers:  IncredibleEmployment.be  This test is no t yet approved or cleared by the [place] FDA and  has been authorized for detection and/or diagnosis of SARS-CoV-2 by FDA under an Emergency Use Authorization (EUA). This EUA will remain  in effect (meaning this test can be used) for the duration of the COVID-19 declaration under Section 564(b)(1) of the Act, 21 U.S.C.section 360bbb-3(b)(1), unless the authorization is terminated  or revoked sooner.       Influenza A by PCR NEGATIVE NEGATIVE Final   Influenza B by PCR NEGATIVE NEGATIVE Final    Comment: (NOTE) The Xpert Xpress SARS-CoV-2/FLU/RSV plus assay is intended as an aid in the diagnosis of influenza from Nasopharyngeal swab specimens and should not be used as a sole basis for treatment. Nasal washings and aspirates are unacceptable for Xpert Xpress SARS-CoV-2/FLU/RSV testing.  Fact Sheet for Patients: EntrepreneurPulse.com.au  Fact Sheet for Healthcare Providers: IncredibleEmployment.be  This test is not yet approved or cleared by the [place] FDA and has been authorized for detection and/or diagnosis of SARS-CoV-2 by FDA under an Emergency Use Authorization (EUA). This EUA will remain in effect (meaning this test can be used) for the duration of the COVID-19 declaration under Section 564(b)(1) of the Act, 21 U.S.C.  section 360bbb-3(b)(1), unless the authorization is terminated or revoked.     Resp Syncytial Virus by PCR NEGATIVE NEGATIVE Final    Comment: (NOTE) Fact Sheet for Patients: BloggerCourse.com  Fact Sheet for Healthcare Providers: SeriousBroker.it  This test is not yet approved or cleared by the [place] FDA and has been authorized for detection and/or diagnosis of SARS-CoV-2 by FDA under an Emergency Use  Authorization (EUA). This EUA will remain in effect (meaning this test can be used) for the duration of the COVID-19 declaration under Section 564(b)(1) of the Act, 21 U.S.C. section 360bbb-3(b)(1), unless the authorization is terminated or revoked.  Performed at [facility], [address] [place], [place] [address]          Radiology Studies: No results found.      Scheduled Meds:  ALPRAZolam  0.5 mg Oral BID   amLODipine  5 mg Oral Daily   dapagliflozin propanediol  10 mg Oral QAC breakfast   enoxaparin (LOVENOX) injection  40 mg Subcutaneous Q24H   folic acid  1 mg Oral Daily   gabapentin  300 mg Oral QHS   metoprolol succinate  100 mg Oral Daily   rosuvastatin  40 mg Oral QHS   Continuous Infusions:        Glade Lloyd, MD [facility] 08/13/2022, 7:58 AM

## 2022-08-13 NOTE — Progress Notes (Signed)
Occupational Therapy Note  Measure and cut length of InterDry to fit in skin folds that have skin breakdown (under pannus) Tuck InterDry fabric into skin folds in a single layer, allow for 2 inches of overhang from skin edges to allow for wicking to occur May remove to bathe; dry area thoroughly and then tuck into affected areas again DO NOT APPLY any creams or ointments when using InterDry DO NOT THROW AWAY FOR 5 DAYS unless soiled with stool DO NOT [facility] product, this will inactivate the silver in the material  New sheet of Interdry should be applied after 5 days of use if patient continues to have skin breakdown [facility] #[id number]    [facility], OT/L   Acute OT Clinical Specialist Acute Rehabilitation Services Pager [phone] Office [phone]

## 2022-08-13 NOTE — Progress Notes (Signed)
Occupational Therapy Treatment Patient Details Name: Erika Gates MRN: [id number] DOB: 11-16-1934 Today's Date: 08/13/2022   History of present illness Pt is an 87 year old admitted with progressive bilateral upper extremity and lower extremity weakness. MRI of the cervical spine demonstrates high-grade stenosis at C3-4 C4-5 and C5-6; C3-4 and 5 6 with evidence of cord compression. Plan for surgery 12/22. PMH: CAD s/p CABG, Anxiety, HTN, hyperlipidemia and morbid obesity   OT comments  Pt demonstrating greater difficulty with mobility and increased trunk and LE generalized weakness this session, requiring Max A +2 with use of Stedy to mobilize to chair. Maximove pad placed for staff to use. Interdry repositioned appropriately under pannus and MASD improved from yesterday. Daughter present and concerned about her mother's increased weakness. Pt/daughter given exercises to complete at bed level and stressed importance of pt increasing mobility at other times other than when therapy is working with her. Both pt/daughter verbalized understanding. Plan is for surgery tomorrow with hopeful improvement in strength. If strength improves post op, pt may be appropriate for rehab at [facility] with goal to DC home with 24/7 physical assist. OT will continue to follow.    Recommendations for follow up therapy are one component of a multi-disciplinary discharge planning process, led by the attending physician.  Recommendations may be updated based on patient status, additional functional criteria and insurance authorization.    Follow Up Recommendations  Acute inpatient rehab (3hours/day)     Assistance Recommended at Discharge Frequent or constant Supervision/Assistance  Patient can return home with the following  A lot of help with walking and/or transfers;A lot of help with bathing/dressing/bathroom;Assistance with feeding;Assistance with cooking/housework;Assist for transportation;Help with stairs or ramp for  entrance   Equipment Recommendations  Tub/shower bench;Wheelchair (measurements OT);Wheelchair cushion (measurements OT);Hospital bed    Recommendations for Other Services Rehab consult    Precautions / Restrictions Precautions Precautions: Fall;Other (comment) (skin breakdown under pannus) Required Braces or Orthoses:  (interdry under pannus) Restrictions Weight Bearing Restrictions: No       Mobility Bed Mobility Overal bed mobility: Needs Assistance Bed Mobility: Rolling, Sidelying to Sit Rolling: Mod assist Sidelying to sit: Max assist       General bed mobility comments: increased assistance for mobility today    Transfers                         Balance Overall balance assessment: Needs assistance   Sitting balance-Leahy Scale: Poor Sitting balance - Comments: more difficulty with sitting balance this am however could sit with S once feet supported; increased difficulty with scooting to EOB                                   ADL either performed or assessed with clinical judgement   ADL Overall ADL's : Needs assistance/impaired Eating/Feeding: Minimal assistance Eating/Feeding Details (indicate cue type and reason): with proper set up and use of tubing Grooming: Moderate assistance   Upper Body Bathing: Moderate assistance;Sitting;Bed level                Given additional tubing           Functional mobility during ADLs: Maximal assistance;+2 for physical assistance      Extremity/Trunk Assessment Upper Extremity Assessment Upper Extremity Assessment: RUE deficits/detail;LUE deficits/detail RUE Deficits / Details: shoulder 2/5; elbow flexion 3+/5; ext 4/5; wrist 3+/5; gross grasp/release; more difficulty with in-hand  manipulation skills; decreased abduction and extension of thumb; unable to oppose tips of digits; more neuropraxic with R hand - unchanged from eval LUE Deficits / Details: 1/5 shoulder; elbow flexion 3+/5,  ext 4/5; wrist flex/ext 4/5; grip strength 4/5; able to oppose all digits; decreased in-hand manipulation however functional hand - uncahnged form eval   Lower Extremity Assessment Lower Extremity Assessment: Defer to PT evaluation (BLE genrally weaker than eval)    Theraputty - yellow; intrinsic hand strengthening    Vision       Perception     Praxis      Cognition Arousal/Alertness: Awake/alert Behavior During Therapy: WFL for tasks assessed/performed Overall Cognitive Status: Within Functional Limits for tasks assessed                                          Exercises Exercises: General Upper Extremity, General Lower Extremity General Exercises - Upper Extremity Shoulder Flexion: AROM, AAROM, 10 reps, Both, Supine Shoulder ABduction: AROM, AAROM, Both, 10 reps, Supine Elbow Flexion: AROM, AAROM, Both, 10 reps Elbow Extension: AROM, AAROM, Both, 10 reps Digit Composite Flexion: AROM, Strengthening, Both, 10 reps Composite Extension: AROM, Both, 10 reps General Exercises - Lower Extremity Short Arc Quad: 20 reps, Both, Seated Heel Slides: 15 reps, Supine, Strengthening, Both Hip Flexion/Marching: AROM, Strengthening, Both, 20 reps, Seated Toe Raises: AROM, Both, 10 reps, Seated Mini-Sqauts: Strengthening, Standing (from stedy)    Shoulder Instructions       General Comments MASD improved after use of Interdry over the night    Pertinent Vitals/ Pain       Pain Assessment Pain Assessment: Faces Faces Pain Scale: Hurts even more Pain Location: LUE, B knees Pain Descriptors / Indicators: Discomfort, Grimacing Pain Intervention(s): Limited activity within patient's tolerance  Home Living                                          Prior Functioning/Environment              Frequency  Min 2X/week        Progress Toward Goals  OT Goals(current goals can now be found in the care plan section)  Progress towards OT  goals: Progressing toward goals  Acute Rehab OT Goals Patient Stated Goal: to get stronger OT Goal Formulation: With patient/family Time For Goal Achievement: 08/26/22 Potential to Achieve Goals: Good ADL Goals Pt Will Perform Eating: with min assist;with adaptive utensils;sitting Pt Will Perform Grooming: with min assist;sitting;with adaptive equipment Pt Will Perform Upper Body Bathing: with min assist;standing;with adaptive equipment Pt Will Transfer to Toilet: with mod assist;stand pivot transfer Pt/caregiver will Perform Home Exercise Program: Increased strength;Increased ROM;Both right and left upper extremity;With written HEP provided;With theraputty  Plan Discharge plan remains appropriate (pending progress)    Co-evaluation                 AM-PAC OT "6 Clicks" Daily Activity     Outcome Measure   Help from another person eating meals?: A Lot Help from another person taking care of personal grooming?: A Lot Help from another person toileting, which includes using toliet, bedpan, or urinal?: Total Help from another person bathing (including washing, rinsing, drying)?: A Lot Help from another person to put on and taking off regular upper body  clothing?: A Lot Help from another person to put on and taking off regular lower body clothing?: Total 6 Click Score: 10    End of Session Equipment Utilized During Treatment: Gait belt  OT Visit Diagnosis: Unsteadiness on feet (R26.81);Other abnormalities of gait and mobility (R26.89);Muscle weakness (generalized) (M62.81);Pain Pain - Right/Left: Left Pain - part of body: Shoulder;Arm (Bknees)   Activity Tolerance Patient tolerated treatment well   Patient Left in chair;with call bell/phone within reach;with family/visitor present   Nurse Communication Mobility status;Need for lift equipment;Other (comment) (use of Interdry)        TimeEC:[id number] OT Time Calculation (min): 31 min  Charges: OT General Charges $OT  Visit: 1 Visit OT Treatments $Self Care/Home Management : 8-22 mins $Therapeutic Exercise: 8-22 mins  Maurie Boettcher, OT/L   Acute OT Clinical Specialist Acute Rehabilitation Services Pager [phone] Office [phone]   [facility] 08/13/2022, 11:57 AM

## 2022-08-13 NOTE — H&P (View-Only) (Signed)
Patient ID: Erika Gates, female   DOB: [birth year], 87 y.o.   MRN: [id number] Vital signs are stable Patient's neuroexam remains unchanged with substantial weakness in the upper extremities particularly with grips at 4- out of 5 Has not been ambulatory After careful consideration I discussed with the family and we are planning to do surgical decompression of the cervical spine at C3-4 C4-5 and C5-6 tomorrow morning.  I spent some time talking to the patient and her daughter who was present answering questions.  Surgery scheduled for 7:30 in the morning and hopeful that we will see some improvement in her level of function and rehab potential thereafter.

## 2022-08-14 ENCOUNTER — Inpatient Hospital Stay (HOSPITAL_COMMUNITY): Payer: Medicare PPO

## 2022-08-14 ENCOUNTER — Inpatient Hospital Stay (HOSPITAL_COMMUNITY): Payer: Medicare PPO | Admitting: Certified Registered Nurse Anesthetist

## 2022-08-14 ENCOUNTER — Inpatient Hospital Stay (HOSPITAL_COMMUNITY)
Admission: EM | Disposition: A | Discharge status: Rehabilitation Facility | Payer: Self-pay | Source: Home / Self Care | Attending: Internal Medicine

## 2022-08-14 ENCOUNTER — Encounter (HOSPITAL_COMMUNITY): Payer: Self-pay | Admitting: Internal Medicine

## 2022-08-14 ENCOUNTER — Other Ambulatory Visit: Payer: Self-pay

## 2022-08-14 DIAGNOSIS — R29898 Other symptoms and signs involving the musculoskeletal system: Secondary | ICD-10-CM | POA: Diagnosis not present

## 2022-08-14 DIAGNOSIS — I110 Hypertensive heart disease with heart failure: Secondary | ICD-10-CM | POA: Diagnosis not present

## 2022-08-14 DIAGNOSIS — M4712 Other spondylosis with myelopathy, cervical region: Secondary | ICD-10-CM | POA: Diagnosis not present

## 2022-08-14 DIAGNOSIS — I2510 Atherosclerotic heart disease of native coronary artery without angina pectoris: Secondary | ICD-10-CM

## 2022-08-14 DIAGNOSIS — I509 Heart failure, unspecified: Secondary | ICD-10-CM

## 2022-08-14 DIAGNOSIS — I10 Essential (primary) hypertension: Secondary | ICD-10-CM | POA: Diagnosis not present

## 2022-08-14 DIAGNOSIS — M4802 Spinal stenosis, cervical region: Secondary | ICD-10-CM

## 2022-08-14 DIAGNOSIS — G8250 Quadriplegia, unspecified: Secondary | ICD-10-CM

## 2022-08-14 HISTORY — PX: ANTERIOR CERVICAL DECOMP/DISCECTOMY FUSION: SHX1161

## 2022-08-14 LAB — GLUCOSE, CAPILLARY
Glucose-Capillary: 86 mg/dL (ref 70–99)
Glucose-Capillary: 119 mg/dL — ABNORMAL HIGH (ref 70–99)
Glucose-Capillary: 137 mg/dL — ABNORMAL HIGH (ref 70–99)
Glucose-Capillary: 212 mg/dL — ABNORMAL HIGH (ref 70–99)
Glucose-Capillary: 264 mg/dL — ABNORMAL HIGH (ref 70–99)

## 2022-08-14 SURGERY — ANTERIOR CERVICAL DECOMPRESSION/DISCECTOMY FUSION 3 LEVELS
Anesthesia: General

## 2022-08-14 MED ORDER — SODIUM CHLORIDE 0.9% FLUSH: 3.0000 mL | INTRAVENOUS | Status: DC | PRN

## 2022-08-14 MED ORDER — CEFAZOLIN SODIUM-DEXTROSE 2-3 GM-%(50ML) IV SOLR
INTRAVENOUS | Status: DC | PRN
  Administered 2022-08-14: 2 g via INTRAVENOUS

## 2022-08-14 MED ORDER — LIDOCAINE 2% (20 MG/ML) 5 ML SYRINGE
INTRAMUSCULAR | Status: DC | PRN
  Administered 2022-08-14: 40 mg via INTRAVENOUS

## 2022-08-14 MED ORDER — ONDANSETRON HCL 4 MG/2ML IJ SOLN
INTRAMUSCULAR | Status: DC | PRN
  Administered 2022-08-14: 4 mg via INTRAVENOUS

## 2022-08-14 MED ORDER — BISACODYL 10 MG RE SUPP: 10.0000 mg | Freq: Every day | RECTAL | Status: DC | PRN

## 2022-08-14 MED ORDER — FENTANYL CITRATE (PF) 250 MCG/5ML IJ SOLN
INTRAMUSCULAR | Status: AC
  Filled 2022-08-14: qty 5

## 2022-08-14 MED ORDER — THROMBIN 5000 UNITS EX SOLR
CUTANEOUS | Status: AC
  Filled 2022-08-14: qty 5000

## 2022-08-14 MED ORDER — DEXAMETHASONE SODIUM PHOSPHATE 10 MG/ML IJ SOLN
INTRAMUSCULAR | Status: AC
  Filled 2022-08-14: qty 1

## 2022-08-14 MED ORDER — ACETAMINOPHEN 650 MG RE SUPP: 650.0000 mg | RECTAL | Status: DC | PRN

## 2022-08-14 MED ORDER — PROPOFOL 10 MG/ML IV BOLUS
INTRAVENOUS | Status: AC
  Filled 2022-08-14: qty 20

## 2022-08-14 MED ORDER — METHOCARBAMOL 1000 MG/10ML IJ SOLN: 500.0000 mg | Freq: Four times a day (QID) | INTRAVENOUS | Status: DC | PRN

## 2022-08-14 MED ORDER — ACETAMINOPHEN 500 MG PO TABS: 1000.0000 mg | ORAL_TABLET | Freq: Four times a day (QID) | ORAL | Status: DC | PRN

## 2022-08-14 MED ORDER — VITAMIN D 25 MCG (1000 UNIT) PO TABS
2000.0000 [IU] | ORAL_TABLET | Freq: Every day | ORAL | Status: DC
  Administered 2022-08-14 – 2022-08-15 (×2): 2000 [IU] via ORAL
  Filled 2022-08-14 (×2): qty 2

## 2022-08-14 MED ORDER — PHENYLEPHRINE HCL-NACL 20-0.9 MG/250ML-% IV SOLN
INTRAVENOUS | Status: DC | PRN
  Administered 2022-08-14: 40 ug/min via INTRAVENOUS

## 2022-08-14 MED ORDER — LISINOPRIL 20 MG PO TABS
20.0000 mg | ORAL_TABLET | Freq: Every day | ORAL | Status: DC
  Administered 2022-08-14: 20 mg via ORAL
  Filled 2022-08-14: qty 1

## 2022-08-14 MED ORDER — BUPIVACAINE HCL (PF) 0.5 % IJ SOLN
INTRAMUSCULAR | Status: AC
  Filled 2022-08-14: qty 30

## 2022-08-14 MED ORDER — ONDANSETRON HCL 4 MG/2ML IJ SOLN: 4.0000 mg | Freq: Four times a day (QID) | INTRAMUSCULAR | Status: DC | PRN

## 2022-08-14 MED ORDER — LIDOCAINE-EPINEPHRINE 1 %-1:100000 IJ SOLN
INTRAMUSCULAR | Status: DC | PRN
  Administered 2022-08-14: 4.5 mL

## 2022-08-14 MED ORDER — DOCUSATE SODIUM 100 MG PO CAPS
100.0000 mg | ORAL_CAPSULE | Freq: Two times a day (BID) | ORAL | Status: DC
  Administered 2022-08-14 – 2022-08-15 (×3): 100 mg via ORAL
  Filled 2022-08-14 (×3): qty 1

## 2022-08-14 MED ORDER — INSULIN ASPART 100 UNIT/ML IJ SOLN
0.0000 [IU] | Freq: Three times a day (TID) | INTRAMUSCULAR | Status: DC
  Administered 2022-08-15 (×2): 3 [IU] via SUBCUTANEOUS

## 2022-08-14 MED ORDER — EPHEDRINE SULFATE-NACL 50-0.9 MG/10ML-% IV SOSY
PREFILLED_SYRINGE | INTRAVENOUS | Status: DC | PRN
  Administered 2022-08-14: 5 mg via INTRAVENOUS

## 2022-08-14 MED ORDER — ROCURONIUM BROMIDE 10 MG/ML (PF) SYRINGE
PREFILLED_SYRINGE | INTRAVENOUS | Status: DC | PRN
  Administered 2022-08-14: 100 mg via INTRAVENOUS

## 2022-08-14 MED ORDER — SENNA 8.6 MG PO TABS
1.0000 | ORAL_TABLET | Freq: Two times a day (BID) | ORAL | Status: DC
  Administered 2022-08-14 – 2022-08-15 (×3): 8.6 mg via ORAL
  Filled 2022-08-14 (×3): qty 1

## 2022-08-14 MED ORDER — FENTANYL CITRATE (PF) 250 MCG/5ML IJ SOLN
INTRAMUSCULAR | Status: DC | PRN
  Administered 2022-08-14: 75 ug via INTRAVENOUS
  Administered 2022-08-14: 25 ug via INTRAVENOUS

## 2022-08-14 MED ORDER — SODIUM CHLORIDE 0.9% FLUSH
3.0000 mL | Freq: Two times a day (BID) | INTRAVENOUS | Status: DC
  Administered 2022-08-14: 3 mL via INTRAVENOUS

## 2022-08-14 MED ORDER — CEFAZOLIN SODIUM-DEXTROSE 2-4 GM/100ML-% IV SOLN
INTRAVENOUS | Status: AC
  Filled 2022-08-14: qty 100

## 2022-08-14 MED ORDER — LIDOCAINE-EPINEPHRINE 1 %-1:100000 IJ SOLN
INTRAMUSCULAR | Status: AC
  Filled 2022-08-14: qty 1

## 2022-08-14 MED ORDER — PHENOL 1.4 % MT LIQD: 1.0000 | OROMUCOSAL | Status: DC | PRN

## 2022-08-14 MED ORDER — ONDANSETRON HCL 4 MG/2ML IJ SOLN
INTRAMUSCULAR | Status: AC
  Filled 2022-08-14: qty 2

## 2022-08-14 MED ORDER — DEXAMETHASONE SODIUM PHOSPHATE 10 MG/ML IJ SOLN
INTRAMUSCULAR | Status: DC | PRN
  Administered 2022-08-14: 10 mg via INTRAVENOUS

## 2022-08-14 MED ORDER — GABAPENTIN 100 MG PO CAPS
100.0000 mg | ORAL_CAPSULE | Freq: Every day | ORAL | Status: DC
  Administered 2022-08-15: 100 mg via ORAL
  Filled 2022-08-14: qty 1

## 2022-08-14 MED ORDER — OXYCODONE-ACETAMINOPHEN 5-325 MG PO TABS
1.0000 | ORAL_TABLET | ORAL | Status: DC | PRN
  Administered 2022-08-14 – 2022-08-15 (×3): 1 via ORAL
  Filled 2022-08-14 (×2): qty 1

## 2022-08-14 MED ORDER — MORPHINE SULFATE (PF) 2 MG/ML IV SOLN: 2.0000 mg | INTRAVENOUS | Status: DC | PRN

## 2022-08-14 MED ORDER — 0.9 % SODIUM CHLORIDE (POUR BTL) OPTIME
TOPICAL | Status: DC | PRN
  Administered 2022-08-14: 1000 mL

## 2022-08-14 MED ORDER — ONDANSETRON HCL 4 MG PO TABS: 4.0000 mg | ORAL_TABLET | Freq: Four times a day (QID) | ORAL | Status: DC | PRN

## 2022-08-14 MED ORDER — ACETAMINOPHEN 500 MG PO TABS: 1000.0000 mg | ORAL_TABLET | Freq: Once | ORAL | Status: DC

## 2022-08-14 MED ORDER — ROCURONIUM BROMIDE 10 MG/ML (PF) SYRINGE
PREFILLED_SYRINGE | INTRAVENOUS | Status: AC
  Filled 2022-08-14: qty 10

## 2022-08-14 MED ORDER — THROMBIN 5000 UNITS EX SOLR
OROMUCOSAL | Status: DC | PRN
  Administered 2022-08-14 (×2): 5 mL via TOPICAL

## 2022-08-14 MED ORDER — LACTATED RINGERS IV SOLN: INTRAVENOUS | Status: DC | PRN

## 2022-08-14 MED ORDER — BUPIVACAINE HCL (PF) 0.5 % IJ SOLN
INTRAMUSCULAR | Status: DC | PRN
  Administered 2022-08-14: 4.5 mL

## 2022-08-14 MED ORDER — FLEET ENEMA 7-19 GM/118ML RE ENEM: 1.0000 | ENEMA | Freq: Once | RECTAL | Status: DC | PRN

## 2022-08-14 MED ORDER — FUROSEMIDE 20 MG PO TABS
20.0000 mg | ORAL_TABLET | Freq: Every day | ORAL | Status: DC
  Administered 2022-08-14: 20 mg via ORAL
  Filled 2022-08-14: qty 1

## 2022-08-14 MED ORDER — METHOCARBAMOL 500 MG PO TABS
500.0000 mg | ORAL_TABLET | Freq: Four times a day (QID) | ORAL | Status: DC | PRN
  Administered 2022-08-14 – 2022-08-15 (×2): 500 mg via ORAL
  Filled 2022-08-14 (×2): qty 1

## 2022-08-14 MED ORDER — EPHEDRINE 5 MG/ML INJ
INTRAVENOUS | Status: AC
  Filled 2022-08-14: qty 5

## 2022-08-14 MED ORDER — OXYCODONE-ACETAMINOPHEN 5-325 MG PO TABS
ORAL_TABLET | ORAL | Status: AC
  Filled 2022-08-14: qty 1

## 2022-08-14 MED ORDER — FENTANYL CITRATE (PF) 100 MCG/2ML IJ SOLN
25.0000 ug | INTRAMUSCULAR | Status: DC | PRN
  Administered 2022-08-14: 25 ug via INTRAVENOUS

## 2022-08-14 MED ORDER — ACETAMINOPHEN 325 MG PO TABS: 650.0000 mg | ORAL_TABLET | ORAL | Status: DC | PRN

## 2022-08-14 MED ORDER — FENTANYL CITRATE (PF) 100 MCG/2ML IJ SOLN
INTRAMUSCULAR | Status: AC
  Filled 2022-08-14: qty 2

## 2022-08-14 MED ORDER — PHENYLEPHRINE HCL (PRESSORS) 10 MG/ML IV SOLN
INTRAVENOUS | Status: DC | PRN
  Administered 2022-08-14: 80 ug via INTRAVENOUS
  Administered 2022-08-14: 160 ug via INTRAVENOUS
  Administered 2022-08-14: 50 ug via INTRAVENOUS

## 2022-08-14 MED ORDER — POLYETHYLENE GLYCOL 3350 17 G PO PACK: 17.0000 g | PACK | Freq: Every day | ORAL | Status: DC | PRN

## 2022-08-14 MED ORDER — LIDOCAINE 2% (20 MG/ML) 5 ML SYRINGE
INTRAMUSCULAR | Status: AC
  Filled 2022-08-14: qty 5

## 2022-08-14 MED ORDER — SODIUM CHLORIDE 0.9 % IV SOLN
250.0000 mL | INTRAVENOUS | Status: DC
  Administered 2022-08-14: 250 mL via INTRAVENOUS

## 2022-08-14 MED ORDER — MENTHOL 3 MG MT LOZG: 1.0000 | LOZENGE | OROMUCOSAL | Status: DC | PRN

## 2022-08-14 MED ORDER — CEFAZOLIN SODIUM-DEXTROSE 2-4 GM/100ML-% IV SOLN
2.0000 g | Freq: Three times a day (TID) | INTRAVENOUS | Status: AC
  Administered 2022-08-14 – 2022-08-15 (×2): 2 g via INTRAVENOUS
  Filled 2022-08-14 (×2): qty 100

## 2022-08-14 MED ORDER — PHENYLEPHRINE 80 MCG/ML (10ML) SYRINGE FOR IV PUSH (FOR BLOOD PRESSURE SUPPORT)
PREFILLED_SYRINGE | INTRAVENOUS | Status: AC
  Filled 2022-08-14: qty 10

## 2022-08-14 MED ORDER — SUGAMMADEX SODIUM 200 MG/2ML IV SOLN
INTRAVENOUS | Status: DC | PRN
  Administered 2022-08-14 (×2): 200 mg via INTRAVENOUS

## 2022-08-14 MED ORDER — ALBUMIN HUMAN 5 % IV SOLN: INTRAVENOUS | Status: DC | PRN

## 2022-08-14 MED ORDER — PROPOFOL 10 MG/ML IV BOLUS
INTRAVENOUS | Status: DC | PRN
  Administered 2022-08-14: 100 mg via INTRAVENOUS

## 2022-08-14 SURGICAL SUPPLY — 52 items
BAG COUNTER SPONGE SURGICOUNT (BAG) ×1 IMPLANT
BAND RUBBER #18 3X1/16 STRL (MISCELLANEOUS) IMPLANT
BIT DRILL ACP 13 (DRILL) IMPLANT
BIT DRILL ACP 15 (DRILL) IMPLANT
BIT DRILL NEURO 2X3.1 SFT TUCH (MISCELLANEOUS) ×1 IMPLANT
BNDG GAUZE DERMACEA FLUFF 4 (GAUZE/BANDAGES/DRESSINGS) IMPLANT
BUR BARREL STRAIGHT FLUTE 4.0 (BURR) IMPLANT
CAGE CERV MOD 7X15X12 7D (Cage) IMPLANT
CANISTER SUCT 3000ML PPV (MISCELLANEOUS) ×1 IMPLANT
DERMABOND ADVANCED .7 DNX12 (GAUZE/BANDAGES/DRESSINGS) ×1 IMPLANT
DRAPE LAPAROTOMY 100X72 PEDS (DRAPES) ×1 IMPLANT
DRAPE MICROSCOPE SLANT 54X150 (MISCELLANEOUS) IMPLANT
DRILL ACP 13 (DRILL) ×1
DRILL ACP 15 (DRILL) ×1
DRILL NEURO 2X3.1 SOFT TOUCH (MISCELLANEOUS) ×1
DURAPREP 6ML APPLICATOR 50/CS (WOUND CARE) ×1 IMPLANT
ELECT COATED BLADE 2.86 ST (ELECTRODE) ×1 IMPLANT
ELECT REM PT RETURN 9FT ADLT (ELECTROSURGICAL) ×1
ELECTRODE REM PT RTRN 9FT ADLT (ELECTROSURGICAL) ×1 IMPLANT
GAUZE 4X4 16PLY ~~LOC~~+RFID DBL (SPONGE) IMPLANT
GEL DBM PROPEL 1ML (Putty) IMPLANT
GLOVE BIOGEL PI IND STRL 8.5 (GLOVE) ×1 IMPLANT
GLOVE ECLIPSE 8.5 STRL (GLOVE) ×1 IMPLANT
GLOVE EXAM NITRILE XL STR (GLOVE) IMPLANT
GOWN STRL REUS W/ TWL LRG LVL3 (GOWN DISPOSABLE) IMPLANT
GOWN STRL REUS W/ TWL XL LVL3 (GOWN DISPOSABLE) ×1 IMPLANT
GOWN STRL REUS W/TWL 2XL LVL3 (GOWN DISPOSABLE) ×1 IMPLANT
GOWN STRL REUS W/TWL LRG LVL3 (GOWN DISPOSABLE)
GOWN STRL REUS W/TWL XL LVL3 (GOWN DISPOSABLE) ×1
HALTER HD/CHIN CERV TRACTION D (MISCELLANEOUS) ×1 IMPLANT
HEMOSTAT POWDER KIT SURGIFOAM (HEMOSTASIS) ×1 IMPLANT
KIT BASIN OR (CUSTOM PROCEDURE TRAY) ×1 IMPLANT
KIT TURNOVER KIT B (KITS) ×1 IMPLANT
NDL SPNL 22GX3.5 QUINCKE BK (NEEDLE) ×1 IMPLANT
NEEDLE HYPO 22GX1.5 SAFETY (NEEDLE) ×1 IMPLANT
NEEDLE SPNL 22GX3.5 QUINCKE BK (NEEDLE) ×2 IMPLANT
NS IRRIG 1000ML POUR BTL (IV SOLUTION) ×1 IMPLANT
PACK LAMINECTOMY NEURO (CUSTOM PROCEDURE TRAY) ×1 IMPLANT
PAD ARMBOARD 7.5X6 YLW CONV (MISCELLANEOUS) ×3 IMPLANT
PATTIES SURGICAL .5 X1 (DISPOSABLE) ×1 IMPLANT
PATTIES SURGICAL 1X1 (DISPOSABLE) IMPLANT
PIN DISTRACTION 14MM (PIN) IMPLANT
PLATE ACP 1.9X54 3LVL (Plate) IMPLANT
SCREW ACP VA ST 3.5X15 (Screw) IMPLANT
SET WALTER ACTIVATION W/DRAPE (SET/KITS/TRAYS/PACK) ×1 IMPLANT
SPIKE FLUID TRANSFER (MISCELLANEOUS) ×1 IMPLANT
SPONGE INTESTINAL PEANUT (DISPOSABLE) ×1 IMPLANT
SUT VIC AB 4-0 RB1 18 (SUTURE) ×2 IMPLANT
TOWEL GREEN STERILE (TOWEL DISPOSABLE) ×1 IMPLANT
TOWEL GREEN STERILE FF (TOWEL DISPOSABLE) ×1 IMPLANT
TRAY FOLEY MTR SLVR 16FR STAT (SET/KITS/TRAYS/PACK) IMPLANT
WATER STERILE IRR 1000ML POUR (IV SOLUTION) ×1 IMPLANT

## 2022-08-14 NOTE — Interval H&P Note (Signed)
History and Physical Interval Note:  08/14/2022 7:42 AM  Erika Gates  has presented today for surgery, with the diagnosis of Quadriparesis.  The various methods of treatment have been discussed with the patient and family. After consideration of risks, benefits and other options for treatment, the patient has consented to  Procedure(s) with comments: C3-4 C4-5 C5-6 ACDF (N/A) - RM 19 as a surgical intervention.  The patient's history has been reviewed, patient examined, no change in status, stable for surgery.  I have reviewed the patient's chart and labs.  Questions were answered to the patient's satisfaction.     Stefani Dama

## 2022-08-14 NOTE — Anesthesia Preprocedure Evaluation (Addendum)
Anesthesia Evaluation  Patient identified by MRN, date of birth, ID band Patient awake    Reviewed: Allergy & Precautions, NPO status , Patient's Chart, lab work & pertinent test results, reviewed documented beta blocker date and time   Airway Mallampati: IV  TM Distance: >3 FB Neck ROM: Full    Dental  (+) Edentulous Upper   Pulmonary neg pulmonary ROS   Pulmonary exam normal breath sounds clear to auscultation       Cardiovascular hypertension (140/58 preop), Pt. on medications and Pt. on home beta blockers pulmonary hypertension (mild pHTN)+ CAD, + CABG and +CHF (grade 1 diastolic dysfunction)  Normal cardiovascular exam Rhythm:Regular Rate:Normal  Echo 2022  1. Left ventricular ejection fraction, by estimation, is 60 to 65%. The  left ventricle has normal function. The left ventricle has no regional  wall motion abnormalities. There is mild left ventricular hypertrophy.  Left ventricular diastolic parameters  are consistent with Grade I diastolic dysfunction (impaired relaxation).  Elevated left ventricular end-diastolic pressure. The E/e' is 29.   2. Right ventricular systolic function is mildly reduced. The right  ventricular size is normal. There is mildly elevated pulmonary artery  systolic pressure. The estimated right ventricular systolic pressure is  36.9 mmHg.   3. The mitral valve is grossly normal. Trivial mitral valve  regurgitation.   4. The aortic valve is tricuspid. Aortic valve regurgitation is not  visualized.   5. The inferior vena cava is dilated in size with <50% respiratory  variability, suggesting right atrial pressure of 15 mmHg.     Neuro/Psych  PSYCHIATRIC DISORDERS Anxiety     progressive bilateral upper extremity and lower extremity weakness. MRI of the cervical spine demonstrates high-grade stenosis at C3-4 C4-5 and C5-6; C3-4 and 5 6 with evidence of cord compression.  C-spine not cleared     GI/Hepatic negative GI ROS, Neg liver ROS,,,  Endo/Other  diabetes    Renal/GU negative Renal ROS  negative genitourinary   Musculoskeletal  (+) Arthritis , Osteoarthritis,    Abdominal  (+) + obese  Peds  Hematology  (+) Blood dyscrasia, anemia Hb 11.5, plt 105   Anesthesia Other Findings   Reproductive/Obstetrics negative OB ROS                             Anesthesia Physical Anesthesia Plan  ASA: 3  Anesthesia Plan: General   Post-op Pain Management: Tylenol PO (pre-op)*   Induction: Intravenous  PONV Risk Score and Plan: 3 and Ondansetron, Dexamethasone and Treatment may vary due to age or medical condition  Airway Management Planned: Oral ETT and Video Laryngoscope Planned  Additional Equipment: None  Intra-op Plan:   Post-operative Plan: Extubation in OR  Informed Consent: I have reviewed the patients History and Physical, chart, labs and discussed the procedure including the risks, benefits and alternatives for the proposed anesthesia with the patient or authorized representative who has indicated his/her understanding and acceptance.     Dental advisory given  Plan Discussed with: CRNA  Anesthesia Plan Comments:        Anesthesia Quick Evaluation

## 2022-08-14 NOTE — Plan of Care (Signed)
Problem: Education: Goal: Knowledge of General Education information will improve Description: Including pain rating scale, medication(s)/side effects and non-pharmacologic comfort measures Outcome: Progressing   Problem: Health Behavior/Discharge Planning: Goal: Ability to manage health-related needs will improve Outcome: Progressing   Problem: Clinical Measurements: Goal: Ability to maintain clinical measurements within normal limits will improve Outcome: Progressing Goal: Will remain free from infection Outcome: Progressing Goal: Diagnostic test results will improve Outcome: Progressing Goal: Respiratory complications will improve Outcome: Progressing Goal: Cardiovascular complication will be avoided Outcome: Progressing   Problem: Activity: Goal: Risk for activity intolerance will decrease Outcome: Progressing   Problem: Nutrition: Goal: Adequate nutrition will be maintained Outcome: Progressing   Problem: Coping: Goal: Level of anxiety will decrease Outcome: Progressing   Problem: Elimination: Goal: Will not experience complications related to bowel motility Outcome: Progressing Goal: Will not experience complications related to urinary retention Outcome: Progressing   Problem: Pain Managment: Goal: General experience of comfort will improve Outcome: Progressing   Problem: Safety: Goal: Ability to remain free from injury will improve Outcome: Progressing   Problem: Skin Integrity: Goal: Risk for impaired skin integrity will decrease Outcome: Progressing   Problem: Education: Goal: Ability to verbalize activity precautions or restrictions will improve Outcome: Progressing Goal: Knowledge of the prescribed therapeutic regimen will improve Outcome: Progressing Goal: Understanding of discharge needs will improve Outcome: Progressing   Problem: Activity: Goal: Ability to avoid complications of mobility impairment will improve Outcome: Progressing Goal:  Ability to tolerate increased activity will improve Outcome: Progressing Goal: Will remain free from falls Outcome: Progressing   Problem: Bowel/Gastric: Goal: Gastrointestinal status for postoperative course will improve Outcome: Progressing   Problem: Clinical Measurements: Goal: Ability to maintain clinical measurements within normal limits will improve Outcome: Progressing Goal: Postoperative complications will be avoided or minimized Outcome: Progressing Goal: Diagnostic test results will improve Outcome: Progressing   Problem: Pain Management: Goal: Pain level will decrease Outcome: Progressing   Problem: Skin Integrity: Goal: Will show signs of wound healing Outcome: Progressing   Problem: Health Behavior/Discharge Planning: Goal: Identification of resources available to assist in meeting health care needs will improve Outcome: Progressing   Problem: Bladder/Genitourinary: Goal: Urinary functional status for postoperative course will improve Outcome: Progressing   Problem: Education: Goal: Ability to describe self-care measures that may prevent or decrease complications (Diabetes Survival Skills Education) will improve Outcome: Progressing Goal: Individualized Educational Video(s) Outcome: Progressing   Problem: Coping: Goal: Ability to adjust to condition or change in health will improve Outcome: Progressing   Problem: Fluid Volume: Goal: Ability to maintain a balanced intake and output will improve Outcome: Progressing   Problem: Health Behavior/Discharge Planning: Goal: Ability to identify and utilize available resources and services will improve Outcome: Progressing Goal: Ability to manage health-related needs will improve Outcome: Progressing   Problem: Metabolic: Goal: Ability to maintain appropriate glucose levels will improve Outcome: Progressing   Problem: Nutritional: Goal: Maintenance of adequate nutrition will improve Outcome:  Progressing Goal: Progress toward achieving an optimal weight will improve Outcome: Progressing   Problem: Skin Integrity: Goal: Risk for impaired skin integrity will decrease Outcome: Progressing   Problem: Tissue Perfusion: Goal: Adequacy of tissue perfusion will improve Outcome: Progressing   Problem: Education: Goal: Knowledge of General Education information will improve Description: Including pain rating scale, medication(s)/side effects and non-pharmacologic comfort measures Outcome: Progressing   Problem: Health Behavior/Discharge Planning: Goal: Ability to manage health-related needs will improve Outcome: Progressing   Problem: Clinical Measurements: Goal: Ability to maintain clinical measurements within normal limits will  improve Outcome: Progressing Goal: Will remain free from infection Outcome: Progressing Goal: Diagnostic test results will improve Outcome: Progressing Goal: Respiratory complications will improve Outcome: Progressing Goal: Cardiovascular complication will be avoided Outcome: Progressing   Problem: Activity: Goal: Risk for activity intolerance will decrease Outcome: Progressing   Problem: Nutrition: Goal: Adequate nutrition will be maintained Outcome: Progressing   Problem: Coping: Goal: Level of anxiety will decrease Outcome: Progressing   Problem: Elimination: Goal: Will not experience complications related to bowel motility Outcome: Progressing Goal: Will not experience complications related to urinary retention Outcome: Progressing   Problem: Pain Managment: Goal: General experience of comfort will improve Outcome: Progressing   Problem: Safety: Goal: Ability to remain free from injury will improve Outcome: Progressing   Problem: Skin Integrity: Goal: Risk for impaired skin integrity will decrease Outcome: Progressing   Problem: Education: Goal: Ability to verbalize activity precautions or restrictions will  improve Outcome: Progressing Goal: Knowledge of the prescribed therapeutic regimen will improve Outcome: Progressing Goal: Understanding of discharge needs will improve Outcome: Progressing   Problem: Activity: Goal: Ability to avoid complications of mobility impairment will improve Outcome: Progressing Goal: Ability to tolerate increased activity will improve Outcome: Progressing Goal: Will remain free from falls Outcome: Progressing   Problem: Bowel/Gastric: Goal: Gastrointestinal status for postoperative course will improve Outcome: Progressing   Problem: Clinical Measurements: Goal: Ability to maintain clinical measurements within normal limits will improve Outcome: Progressing Goal: Postoperative complications will be avoided or minimized Outcome: Progressing Goal: Diagnostic test results will improve Outcome: Progressing   Problem: Pain Management: Goal: Pain level will decrease Outcome: Progressing   Problem: Skin Integrity: Goal: Will show signs of wound healing Outcome: Progressing   Problem: Health Behavior/Discharge Planning: Goal: Identification of resources available to assist in meeting health care needs will improve Outcome: Progressing   Problem: Bladder/Genitourinary: Goal: Urinary functional status for postoperative course will improve Outcome: Progressing   Problem: Education: Goal: Ability to describe self-care measures that may prevent or decrease complications (Diabetes Survival Skills Education) will improve Outcome: Progressing Goal: Individualized Educational Video(s) Outcome: Progressing   Problem: Coping: Goal: Ability to adjust to condition or change in health will improve Outcome: Progressing   Problem: Fluid Volume: Goal: Ability to maintain a balanced intake and output will improve Outcome: Progressing   Problem: Health Behavior/Discharge Planning: Goal: Ability to identify and utilize available resources and services will  improve Outcome: Progressing Goal: Ability to manage health-related needs will improve Outcome: Progressing   Problem: Metabolic: Goal: Ability to maintain appropriate glucose levels will improve Outcome: Progressing   Problem: Nutritional: Goal: Maintenance of adequate nutrition will improve Outcome: Progressing Goal: Progress toward achieving an optimal weight will improve Outcome: Progressing   Problem: Skin Integrity: Goal: Risk for impaired skin integrity will decrease Outcome: Progressing   Problem: Tissue Perfusion: Goal: Adequacy of tissue perfusion will improve Outcome: Progressing

## 2022-08-14 NOTE — Anesthesia Procedure Notes (Signed)
Procedure Name: Intubation Date/Time: 08/14/2022 8:19 AM  Performed by: Michele Rockers, CRNAPre-anesthesia Checklist: Patient identified, Patient being monitored, Timeout performed, Emergency Drugs available and Suction available Patient Re-evaluated:Patient Re-evaluated prior to induction Oxygen Delivery Method: Circle system utilized Preoxygenation: Pre-oxygenation with 100% oxygen Induction Type: IV induction Ventilation: Mask ventilation without difficulty and Oral airway inserted - appropriate to patient size Laryngoscope Size: Mac, 3 and Glidescope Grade View: Grade I Tube type: Oral Tube size: 7.0 mm Number of attempts: 1 Airway Equipment and Method: Stylet and Video-laryngoscopy Placement Confirmation: ETT inserted through vocal cords under direct vision, positive ETCO2 and breath sounds checked- equal and bilateral Secured at: 19 cm Tube secured with: Tape Dental Injury: Teeth and Oropharynx as per pre-operative assessment

## 2022-08-14 NOTE — Progress Notes (Signed)
2 yellow metal rings removed from patient's right hand and given to daughter , [name], per the patient's request.

## 2022-08-14 NOTE — Progress Notes (Addendum)
Attempted to call report to # [phone] Mirian Mo) not available, and Noone else available ( K. Tonkin, L. Paxton)  Page could not be sent

## 2022-08-14 NOTE — Transfer of Care (Signed)
Immediate Anesthesia Transfer of Care Note  Patient: Erika Gates  Procedure(s) Performed: Cerivcal Three-Four, Cerivcal Four-Five, Cerivcal Five-Six Anterior Cervical Discectomy Fusion  Patient Location: PACU  Anesthesia Type:General  Level of Consciousness: awake, alert , patient cooperative, and responds to stimulation  Airway & Oxygen Therapy: Patient Spontanous Breathing and Patient connected to face mask oxygen  Post-op Assessment: Report given to RN and Post -op Vital signs reviewed and stable  Post vital signs: Reviewed and stable  Last Vitals:  Vitals Value Taken Time  BP 142/63 08/14/22 1145  Temp    Pulse 72 08/14/22 1147  Resp 18 08/14/22 1147  SpO2 97 % 08/14/22 1147  Vitals shown include unvalidated device data.  Last Pain:  Vitals:   08/14/22 0417  TempSrc: Oral  PainSc:          Complications: No notable events documented.

## 2022-08-14 NOTE — Anesthesia Postprocedure Evaluation (Signed)
Anesthesia Post Note  Patient: GENEVER HENTGES  Procedure(s) Performed: Cerivcal Three-Four, Cerivcal Four-Five, Cerivcal Five-Six Anterior Cervical Discectomy Fusion     Patient location during evaluation: PACU Anesthesia Type: General Level of consciousness: awake and alert, oriented and patient cooperative Pain management: pain level controlled Vital Signs Assessment: post-procedure vital signs reviewed and stable Respiratory status: spontaneous breathing, nonlabored ventilation and respiratory function stable Cardiovascular status: blood pressure returned to baseline and stable Postop Assessment: no apparent nausea or vomiting Anesthetic complications: no   No notable events documented.  Last Vitals:  Vitals:   08/14/22 1445 08/14/22 1515  BP: 135/65 (!) 142/59  Pulse: 86 85  Resp: 15 19  Temp:    SpO2: 95% 94%    Last Pain:  Vitals:   08/14/22 1415  TempSrc:   PainSc: Asleep                 Lannie Fields

## 2022-08-14 NOTE — Op Note (Signed)
Date of surgery: 08/14/2022 Preoperative diagnosis: Spondylosis with myelopathy C3-4 C4-5 C5-6.  Quadriparesis Postoperative diagnosis: Same Procedure: Anterior cervical decompression and arthrodesis with structural spacer and allograft anterior plate fixation [id number] Surgeon: Barnett Abu First Assistant: Monia Pouch, DO Anesthesia: General endotracheal Indications: Erika Gates is an 87 year old individual whose had progressive weakness in her lower extremities and now her upper extremities which caused her to come for an emergency evaluation as she could not get up to walk or assist in her own activities of daily living.  It was noted that she had severe spondylitic stenosis with cord compression at C3-4 and C5-6 narrowing was also noted at C4-5.  The condition was explained to the patient and her family and a discussion was had regarding surgical intervention for this process given the patient's risk factors including morbid obesity her age of 64 or significantly debilitated status in addition to diabetes these risks not withstanding the patient has elected to proceed with surgical intervention realizing that the gains may be quite limited.  The alternative was that the patient would be likely permanently bedridden.  Procedure: Patient was brought to the operating room supine on the stretcher.  After the smooth induction of general endotracheal anesthesia, she was placed on 5 pounds of halter traction.  The neck was prepped with alcohol DuraPrep and draped in a sterile fashion.  A transverse incision was created in the left-sided neck and this was carried down through the platysma.  The plane between the sternocleidomastoid and strap muscles was carefully dissected down to the prevertebral space was identified.  The first disc spaces that were identified were that of C3-4 and C4-5 requiring 2 separate radiographs.  Once the disc bases were positively identified the longus coli muscle was stripped off  via the side of midline and a self-retaining Caspar type retractor was placed in the wound and attached to the Elderon arm to maintain exposure.  We started at the C4-5 disc space opening the anterior longitudinal ligament and removing some ventral osteophytes.  The disc base was entered and evacuated of modest quantity of severely desiccated disc material the endplates were shaved and particularly the inferior endplate at C4 was noted to have moderate osteophyte this was shaved down the epidural space was then cleared and the ligament was opened from side-to-side decompressing both the path of the C5 nerve roots.  Hemostasis was then carefully achieved and it was felt that a modulus C spacer measuring 7 x 15 x 12 mm with 7 degrees lordosis would fit best into this interval this was filled with demineralized bony matrix and placed into the interspace some additional bony matrix was placed into the lateral gutters on either side.  Attention was then turned to C3-4 where the worst of the spinal cord compression was noted.  Here discectomy was carried out and has significant osteophyte from the inferior margin of the body of C3 was encountered this was removed from either side and this allowed good decompression of the epidural space the lateral recesses were then again decompressed and once completed the interspace was sized for again a 7 x 15 x 12 mm spacer with 7 degrees lordosis was filled with a similar amount of demineralized bone matrix.  Attention was then turned to C5-6 the wound was dissected inferiorly to release some of the soft tissues and allow placement of the retractor.  C5-6 disc space was open and a combination of curettes and rongeurs was used to evacuate a minimal amount of disc  but large bony osteophytic material the inferior margin of the body of C5 was cleared and the interspace was cleared once decompressed hemostasis was achieved and again a 7 x 15 x 12 mm modulus C spacer with 7 degrees  lordosis was placed into the interval.  The demineralized bone matrix was also used here then we fitted the prevertebral space with a 54 mm long plate from [employer] known as the ACP plate.  This was fitted with 8 locking 3.5 x 15 mm screws once all the screws were placed and the plate was locked in the position area was checked for hemostasis and when verified a final radiograph was obtained in the lateral position.  This identified good position of the hardware.  C5-6 was difficult to visualize but visually inspected and noted to be intact.  With this the wound was again inspected for hemostasis and then the platysma was carefully closed with 4-0 Vicryl in interrupted fashion and 4-0 Vicryl was used in the subcuticular tissues Dermabond was placed on the skin.  Blood loss was estimated 150 cc.  Patient tolerated procedure well was returned to recovery room in stable condition.

## 2022-08-14 NOTE — Progress Notes (Signed)
PROGRESS NOTE    Erika Gates  [id number] DOB: [birth year] DOA: 08/10/2022 PCP: Donald Prose, MD   Brief Narrative:  87 y.o. female with medical history significant of CAD s/p CABG, hypertension, hyperlipidemia and morbid obesity presented with worsening upper and lower extremity weakness.  On presentation, CT head showed possible meningioma but otherwise no acute findings.  Bilateral knee x-ray with no acute fractures, dislocation or effusions.  Left shoulder x-ray showed findings consistent with rotator cuff defect.  MRI of cervical spine showed significant cervical canal stenosis.  Neurosurgery planning for surgical intervention today. Assessment & Plan:   Cervical spine stenosis Bilateral upper extremity weakness -MRI of cervical spine showed moderate to severe spinal canal stenosis at various levels.  Neurosurgery following and planning for surgical intervention today. -PT recommends CIR placement. -Fall precautions  Bilateral lower extremity weakness -Possibly from worsening osteoarthritis.  Has had injections to bilateral knees by Dr. Deveshwar/rheumatology -Outpatient follow-up with Dr. Estanislado Pandy -Bilateral knee x-ray showed degenerative changes  History of CAD status post CABG Hyperlipidemia -Stable.  No chest pain. -Continue statin and metoprolol.  Aspirin on hold for possible surgical intervention.  Essential hypertension -Resumed home amlodipine and metoprolol on 08/12/2022.  Blood pressure stable.  If blood pressure remains an issue, will resume lisinopril and Lasix as well.  Thrombocytopenia -Questionable cause.  Monitor intermittently.  No labs today.  Normocytic anemia -Possibly from chronic illnesses.  No signs of bleeding.  Monitor intermittently.  No labs today  Morbid obesity -Outpatient follow-up  DVT prophylaxis: SCDs.  Lovenox held for surgical intervention Code Status: Full Family Communication: Daughter at bedside on 08/13/2022 Disposition  Plan: Status is: inpatient because: Need for neurosurgical intervention  Consultants: Neurosurgery  Procedures: None  Antimicrobials: None   Subjective: Patient seen and examined at bedside.  No worsening shortness of breath, fever, vomiting reported.   Objective: Vitals:   08/13/22 0758 08/13/22 1729 08/13/22 1932 08/14/22 0417  BP: (!) 141/56 (!) 118/44 (!) 118/59 (!) 139/58  Pulse: 81 70 69 61  Resp: 16 18 18 14   Temp: 98.5 F (36.9 C) 97.7 F (36.5 C) 98.4 F (36.9 C) 97.9 F (36.6 C)  TempSrc: Oral Oral Oral Oral  SpO2: 97% 96% 99% 96%    Intake/Output Summary (Last 24 hours) at 08/14/2022 0844 Last data filed at 08/14/2022 0810 Gross per 24 hour  Intake 50 ml  Output 900 ml  Net -850 ml   There were no vitals filed for this visit.  Examination:  General: On room air.  No distress.  Chronically ill and deconditioned.  Elderly female lying in bed.  Still slow to respond.   Respiratory: Decreased breath sounds at bases bilaterally, no wheezing CVS: Rate controlled; S1-S2 heard abdominal: Soft, nontender, mildly distended; no organomegaly; bowel sounds are heard  extremities: Trace lower extremity edema present; no clubbing    Data Reviewed: I have personally reviewed following labs and imaging studies  CBC: Recent Labs  Lab 08/10/22 1649 08/11/22 0143 08/12/22 0257  WBC 4.9 5.4 7.5  NEUTROABS 2.8  --  5.9  HGB 11.2* 11.4* 11.7*  HCT 38.7 38.0 37.0  MCV 95.8 93.6 89.8  PLT 119* 107* 105*    Basic Metabolic Panel: Recent Labs  Lab 08/10/22 1649 08/12/22 0257  NA 145 140  K 4.7 4.1  CL 111 107  CO2 26 26  GLUCOSE 82 121*  BUN 29* 16  CREATININE 0.90 0.72  CALCIUM 10.0 10.1  MG  --  2.1  GFR: CrCl cannot be calculated (Unknown ideal weight.). Liver Function Tests: Recent Labs  Lab 08/10/22 1649  AST 30  ALT 24  ALKPHOS 81  BILITOT 0.5  PROT 6.6  ALBUMIN 3.2*    No results for input(s): "LIPASE", "AMYLASE" in the last 168  hours. No results for input(s): "AMMONIA" in the last 168 hours. Coagulation Profile: No results for input(s): "INR", "PROTIME" in the last 168 hours. Cardiac Enzymes: Recent Labs  Lab 08/10/22 1649  CKTOTAL 88    BNP (last 3 results) No results for input(s): "PROBNP" in the last 8760 hours. HbA1C: No results for input(s): "HGBA1C" in the last 72 hours. CBG: Recent Labs  Lab 08/14/22 0734  GLUCAP 86   Lipid Profile: No results for input(s): "CHOL", "HDL", "LDLCALC", "TRIG", "CHOLHDL", "LDLDIRECT" in the last 72 hours. Thyroid Function Tests: No results for input(s): "TSH", "T4TOTAL", "FREET4", "T3FREE", "THYROIDAB" in the last 72 hours.  Anemia Panel: Recent Labs    08/12/22 0257  VITAMINB12 307    Sepsis Labs: Recent Labs  Lab 08/10/22 1649  LATICACIDVEN 0.9     Recent Results (from the past 240 hour(s))  Resp panel by RT-PCR (RSV, Flu A&B, Covid) Anterior Nasal Swab     Status: None   Collection Time: 08/10/22  6:18 AM   Specimen: Anterior Nasal Swab  Result Value Ref Range Status   SARS Coronavirus 2 by RT PCR NEGATIVE NEGATIVE Final    Comment: (NOTE) SARS-CoV-2 target nucleic acids are NOT DETECTED.  The SARS-CoV-2 RNA is generally detectable in upper respiratory specimens during the acute phase of infection. The lowest concentration of SARS-CoV-2 viral copies this assay can detect is 138 copies/mL. A negative result does not preclude SARS-Cov-2 infection and should not be used as the sole basis for treatment or other patient management decisions. A negative result may occur with  improper specimen collection/handling, submission of specimen other than nasopharyngeal swab, presence of viral mutation(s) within the areas targeted by this assay, and inadequate number of viral copies(<138 copies/mL). A negative result must be combined with clinical observations, patient history, and epidemiological information. The expected result is Negative.  Fact  Sheet for Patients:  BloggerCourse.com  Fact Sheet for Healthcare Providers:  SeriousBroker.it  This test is no t yet approved or cleared by the [place] FDA and  has been authorized for detection and/or diagnosis of SARS-CoV-2 by FDA under an Emergency Use Authorization (EUA). This EUA will remain  in effect (meaning this test can be used) for the duration of the COVID-19 declaration under Section 564(b)(1) of the Act, 21 U.S.C.section 360bbb-3(b)(1), unless the authorization is terminated  or revoked sooner.       Influenza A by PCR NEGATIVE NEGATIVE Final   Influenza B by PCR NEGATIVE NEGATIVE Final    Comment: (NOTE) The Xpert Xpress SARS-CoV-2/FLU/RSV plus assay is intended as an aid in the diagnosis of influenza from Nasopharyngeal swab specimens and should not be used as a sole basis for treatment. Nasal washings and aspirates are unacceptable for Xpert Xpress SARS-CoV-2/FLU/RSV testing.  Fact Sheet for Patients: BloggerCourse.com  Fact Sheet for Healthcare Providers: SeriousBroker.it  This test is not yet approved or cleared by the [place] FDA and has been authorized for detection and/or diagnosis of SARS-CoV-2 by FDA under an Emergency Use Authorization (EUA). This EUA will remain in effect (meaning this test can be used) for the duration of the COVID-19 declaration under Section 564(b)(1) of the Act, 21 U.S.C. section 360bbb-3(b)(1), unless the authorization  is terminated or revoked.     Resp Syncytial Virus by PCR NEGATIVE NEGATIVE Final    Comment: (NOTE) Fact Sheet for Patients: EntrepreneurPulse.com.au  Fact Sheet for Healthcare Providers: IncredibleEmployment.be  This test is not yet approved or cleared by the [place] FDA and has been authorized for detection and/or diagnosis of SARS-CoV-2 by FDA under an  Emergency Use Authorization (EUA). This EUA will remain in effect (meaning this test can be used) for the duration of the COVID-19 declaration under Section 564(b)(1) of the Act, 21 U.S.C. section 360bbb-3(b)(1), unless the authorization is terminated or revoked.  Performed at [facility], [place] [address], [place], [place] [address]          Radiology Studies: No results found.      Scheduled Meds:  [MAR Hold] ALPRAZolam  0.5 mg Oral BID   [MAR Hold] amLODipine  5 mg Oral Daily   [MAR Hold] dapagliflozin propanediol  10 mg Oral QAC breakfast   [MAR Hold] folic acid  1 mg Oral Daily   [MAR Hold] gabapentin  300 mg Oral QHS   [MAR Hold] metoprolol succinate  100 mg Oral Daily   [MAR Hold] rosuvastatin  40 mg Oral QHS   Continuous Infusions:  ceFAZolin            Aline August, MD [facility] 08/14/2022, 8:44 AM

## 2022-08-15 DIAGNOSIS — I10 Essential (primary) hypertension: Secondary | ICD-10-CM | POA: Diagnosis not present

## 2022-08-15 DIAGNOSIS — R7989 Other specified abnormal findings of blood chemistry: Secondary | ICD-10-CM

## 2022-08-15 DIAGNOSIS — R29898 Other symptoms and signs involving the musculoskeletal system: Secondary | ICD-10-CM | POA: Diagnosis not present

## 2022-08-15 DIAGNOSIS — M4802 Spinal stenosis, cervical region: Secondary | ICD-10-CM | POA: Diagnosis not present

## 2022-08-15 LAB — CBC WITH DIFFERENTIAL/PLATELET
Abs Immature Granulocytes: 0.03 10*3/uL (ref 0.00–0.07)
Basophils Absolute: 0 10*3/uL (ref 0.0–0.1)
Basophils Relative: 0 %
Eosinophils Absolute: 0 10*3/uL (ref 0.0–0.5)
Eosinophils Relative: 0 %
HCT: 32.7 % — ABNORMAL LOW (ref 36.0–46.0)
Hemoglobin: 10.3 g/dL — ABNORMAL LOW (ref 12.0–15.0)
Immature Granulocytes: 0 %
Lymphocytes Relative: 4 %
Lymphs Abs: 0.4 10*3/uL — ABNORMAL LOW (ref 0.7–4.0)
MCH: 29 pg (ref 26.0–34.0)
MCHC: 31.5 g/dL (ref 30.0–36.0)
MCV: 92.1 fL (ref 80.0–100.0)
Monocytes Absolute: 0.9 10*3/uL (ref 0.1–1.0)
Monocytes Relative: 10 %
Neutro Abs: 8.2 10*3/uL — ABNORMAL HIGH (ref 1.7–7.7)
Neutrophils Relative %: 86 %
Platelets: 120 10*3/uL — ABNORMAL LOW (ref 150–400)
RBC: 3.55 MIL/uL — ABNORMAL LOW (ref 3.87–5.11)
RDW: 15.4 % (ref 11.5–15.5)
WBC: 9.6 10*3/uL (ref 4.0–10.5)
nRBC: 0 % (ref 0.0–0.2)

## 2022-08-15 LAB — BASIC METABOLIC PANEL
Anion gap: 6 (ref 5–15)
BUN: 30 mg/dL — ABNORMAL HIGH (ref 8–23)
CO2: 26 mmol/L (ref 22–32)
Calcium: 9.4 mg/dL (ref 8.9–10.3)
Chloride: 103 mmol/L (ref 98–111)
Creatinine, Ser: 1.08 mg/dL — ABNORMAL HIGH (ref 0.44–1.00)
GFR, Estimated: 50 mL/min — ABNORMAL LOW (ref >60)
Glucose, Bld: 178 mg/dL — ABNORMAL HIGH (ref 70–99)
Potassium: 5.3 mmol/L — ABNORMAL HIGH (ref 3.5–5.1)
Sodium: 135 mmol/L (ref 135–145)

## 2022-08-15 LAB — GLUCOSE, CAPILLARY
Glucose-Capillary: 145 mg/dL — ABNORMAL HIGH (ref 70–99)
Glucose-Capillary: 132 mg/dL — ABNORMAL HIGH (ref 70–99)
Glucose-Capillary: 107 mg/dL — ABNORMAL HIGH (ref 70–99)
Glucose-Capillary: 155 mg/dL — ABNORMAL HIGH (ref 70–99)

## 2022-08-15 LAB — HEMOGLOBIN A1C
Hgb A1c MFr Bld: 6.2 % — ABNORMAL HIGH (ref 4.8–5.6)
Mean Plasma Glucose: 131 mg/dL

## 2022-08-15 LAB — MAGNESIUM: Magnesium: 2.3 mg/dL (ref 1.7–2.4)

## 2022-08-15 MED ORDER — SODIUM CHLORIDE 0.45 % IV BOLUS
500.0000 mL | Freq: Once | INTRAVENOUS | Status: AC
  Administered 2022-08-15: 500 mL via INTRAVENOUS

## 2022-08-15 MED ORDER — SODIUM CHLORIDE 0.45 % IV SOLN
INTRAVENOUS | Status: AC
  Administered 2022-08-15: 75 mL/h via INTRAVENOUS

## 2022-08-15 MED ORDER — SODIUM ZIRCONIUM CYCLOSILICATE 10 G PO PACK
10.0000 g | PACK | Freq: Once | ORAL | Status: AC
  Administered 2022-08-15: 10 g via ORAL
  Filled 2022-08-15: qty 1

## 2022-08-15 NOTE — Progress Notes (Signed)
PROGRESS NOTE    Erika Gates  [id number] DOB: 07-11-1935 DOA: 08/10/2022 PCP: Donald Prose, MD   Brief Narrative:  87 y.o. female with medical history significant of CAD s/p CABG, hypertension, hyperlipidemia and morbid obesity presented with worsening upper and lower extremity weakness.  On presentation, CT head showed possible meningioma but otherwise no acute findings.  Bilateral knee x-ray with no acute fractures, dislocation or effusions.  Left shoulder x-ray showed findings consistent with rotator cuff defect.  MRI of cervical spine showed significant cervical canal stenosis.  Patient was seen by neurosurgery and underwent surgical intervention on 08/14/2022.  Assessment & Plan:   Cervical spine stenosis/Bilateral upper extremity weakness -MRI of cervical spine showed moderate to severe spinal canal stenosis at various levels.  Patient underwent Anterior cervical decompression and arthrodesis with structural spacer and allograft anterior plate fixation [id number] on 08/14/22 by Dr. Ellene Route. Seems to be stable.  Pain control.  PT evaluation.  When seen by PT prior to surgery inpatient rehabilitation was recommended.  Bilateral lower extremity weakness -Possibly from worsening osteoarthritis.  Has had injections to bilateral knees by Dr. Deveshwar/rheumatology -Outpatient follow-up with Dr. Estanislado Pandy -Bilateral knee x-ray showed degenerative changes  History of CAD status post CABG/Hyperlipidemia -Stable.  No chest pain. -Continue statin and metoprolol.  Aspirin was placed on hold for surgery.  Should be able to resume at discharge.  Essential hypertension Blood pressure reasonably well-controlled. Patient noted to be on amlodipine and metoprolol. Will discontinue lisinopril and furosemide due to evidence for dehydration this morning.  Prerenal azotemia Rise in BUN and creatinine noted.  Likely due to poor oral intake.  Gentle hydration.  Hold ACE inhibitor and diuretics for now.   Mild hyperkalemia noted.  Not getting any potassium supplements.  Hold the ACE inhibitor.  Recheck labs tomorrow.  Lokelma x 1.  Thrombocytopenia -Questionable cause.  Monitor intermittently.    Normocytic anemia -Possibly from chronic illnesses.  No signs of bleeding.  Monitor intermittently.  No labs today  DVT prophylaxis: SCDs.  Lovenox held for surgical intervention Code Status: Full Family Communication: Grandson at bedside Disposition Plan: To be determined  Status is: inpatient because: Cervical spine surgery  Consultants: Neurosurgery  Procedures: None  Antimicrobials: None   Subjective: Patient mentions that pain in the neck is reasonably well-controlled.  Has had some cough this morning.  Denies any chest pain or shortness of breath.   Objective: Vitals:   08/15/22 0000 08/15/22 0007 08/15/22 0337 08/15/22 0848  BP:  (!) 111/59 (!) 117/49 (!) 128/51  Pulse: 95 98 91 85  Resp:  18 18 18   Temp:  98.6 F (37 C) 98.7 F (37.1 C) 99 F (37.2 C)  TempSrc:  Axillary Oral Oral  SpO2: 95% 92% 93% 95%  Weight:      Height:        Intake/Output Summary (Last 24 hours) at 08/15/2022 1025 Last data filed at 08/15/2022 0316 Gross per 24 hour  Intake 2074.64 ml  Output 2175 ml  Net -100.36 ml    Filed Weights   08/14/22 1215  Weight: 103 kg    Examination:   General appearance: Awake alert.  In no distress Resp: Normal effort.  Diminished air entry at the bases.  No wheezing or rhonchi. Cardio: S1-S2 is normal regular.  No S3-S4.  No rubs murmurs or bruit GI: Abdomen is soft.  Nontender nondistended.  Bowel sounds are present normal.  No masses organomegaly Extremities: Physical deconditioning noted.  Weakness of bilateral upper  and lower extremities noted.    Data Reviewed: I have personally reviewed following labs and imaging studies  CBC: Recent Labs  Lab 08/10/22 1649 08/11/22 0143 08/12/22 0257 08/15/22 0234  WBC 4.9 5.4 7.5 9.6  NEUTROABS  2.8  --  5.9 8.2*  HGB 11.2* 11.4* 11.7* 10.3*  HCT 38.7 38.0 37.0 32.7*  MCV 95.8 93.6 89.8 92.1  PLT 119* 107* 105* 120*    Basic Metabolic Panel: Recent Labs  Lab 08/10/22 1649 08/12/22 0257 08/15/22 0234  NA 145 140 135  K 4.7 4.1 5.3*  CL 111 107 103  CO2 26 26 26   GLUCOSE 82 121* 178*  BUN 29* 16 30*  CREATININE 0.90 0.72 1.08*  CALCIUM 10.0 10.1 9.4  MG  --  2.1 2.3    GFR: Estimated Creatinine Clearance: 42 mL/min (A) (by C-G formula based on SCr of 1.08 mg/dL (H)). Liver Function Tests: Recent Labs  Lab 08/10/22 1649  AST 30  ALT 24  ALKPHOS 81  BILITOT 0.5  PROT 6.6  ALBUMIN 3.2*     Cardiac Enzymes: Recent Labs  Lab 08/10/22 1649  CKTOTAL 88     CBG: Recent Labs  Lab 08/14/22 1145 08/14/22 1420 08/14/22 1732 08/14/22 2128 08/15/22 0619  GLUCAP 119* 137* 212* 264* 145*    Sepsis Labs: Recent Labs  Lab 08/10/22 1649  LATICACIDVEN 0.9     Recent Results (from the past 240 hour(s))  Resp panel by RT-PCR (RSV, Flu A&B, Covid) Anterior Nasal Swab     Status: None   Collection Time: 08/10/22  6:18 AM   Specimen: Anterior Nasal Swab  Result Value Ref Range Status   SARS Coronavirus 2 by RT PCR NEGATIVE NEGATIVE Final    Comment: (NOTE) SARS-CoV-2 target nucleic acids are NOT DETECTED.  The SARS-CoV-2 RNA is generally detectable in upper respiratory specimens during the acute phase of infection. The lowest concentration of SARS-CoV-2 viral copies this assay can detect is 138 copies/mL. A negative result does not preclude SARS-Cov-2 infection and should not be used as the sole basis for treatment or other patient management decisions. A negative result may occur with  improper specimen collection/handling, submission of specimen other than nasopharyngeal swab, presence of viral mutation(s) within the areas targeted by this assay, and inadequate number of viral copies(<138 copies/mL). A negative result must be combined with clinical  observations, patient history, and epidemiological information. The expected result is Negative.  Fact Sheet for Patients:  EntrepreneurPulse.com.au  Fact Sheet for Healthcare Providers:  IncredibleEmployment.be  This test is no t yet approved or cleared by the [place] FDA and  has been authorized for detection and/or diagnosis of SARS-CoV-2 by FDA under an Emergency Use Authorization (EUA). This EUA will remain  in effect (meaning this test can be used) for the duration of the COVID-19 declaration under Section 564(b)(1) of the Act, 21 U.S.C.section 360bbb-3(b)(1), unless the authorization is terminated  or revoked sooner.       Influenza A by PCR NEGATIVE NEGATIVE Final   Influenza B by PCR NEGATIVE NEGATIVE Final    Comment: (NOTE) The Xpert Xpress SARS-CoV-2/FLU/RSV plus assay is intended as an aid in the diagnosis of influenza from Nasopharyngeal swab specimens and should not be used as a sole basis for treatment. Nasal washings and aspirates are unacceptable for Xpert Xpress SARS-CoV-2/FLU/RSV testing.  Fact Sheet for Patients: EntrepreneurPulse.com.au  Fact Sheet for Healthcare Providers: IncredibleEmployment.be  This test is not yet approved or cleared by the [place]  FDA and has been authorized for detection and/or diagnosis of SARS-CoV-2 by FDA under an Emergency Use Authorization (EUA). This EUA will remain in effect (meaning this test can be used) for the duration of the COVID-19 declaration under Section 564(b)(1) of the Act, 21 U.S.C. section 360bbb-3(b)(1), unless the authorization is terminated or revoked.     Resp Syncytial Virus by PCR NEGATIVE NEGATIVE Final    Comment: (NOTE) Fact Sheet for Patients: BloggerCourse.com  Fact Sheet for Healthcare Providers: SeriousBroker.it  This test is not yet approved or cleared by  the [place] FDA and has been authorized for detection and/or diagnosis of SARS-CoV-2 by FDA under an Emergency Use Authorization (EUA). This EUA will remain in effect (meaning this test can be used) for the duration of the COVID-19 declaration under Section 564(b)(1) of the Act, 21 U.S.C. section 360bbb-3(b)(1), unless the authorization is terminated or revoked.  Performed at [facility], [address] [place], [place] [address]          Radiology Studies: DG Cervical Spine 1 View  Result Date: 08/14/2022 CLINICAL DATA:  Anterior cervical fusion EXAM: DG CERVICAL SPINE - 1 VIEW COMPARISON:  None Available. FINDINGS: Single cross-table lateral x-ray of the cervical spine. Anterior cervical fusion from C3 through C6. Endotracheal tube noted. IMPRESSION: 1. Anterior cervical fusion from C3 through C6. Electronically Signed   By: Elige Ko M.D.   On: 08/14/2022 15:11   DG Cervical Spine 2 or 3 views  Result Date: 08/14/2022 CLINICAL DATA:  Confirm correct needle placement. Call operating room with results. EXAM: CERVICAL SPINE - 2-3 VIEW COMPARISON:  MRI cervical spine 08/10/2022 FINDINGS: The atlantodens interval is intact. On the initial image, a needle overlies the anterior C4 vertebral body. There are bulky anterior C3-4 and C4-5 osteophytes. On the second image, a needle overlies the anterior C3-4 disc space. A second needle overlies the anterior C4-5 disc space. IMPRESSION: Localization of the anterior C3-4 and C4-5 disc spaces. Findings discussed with the surgeon Dr. Barnett Abu in the operating room at 9:44 a.m. on 08/14/2022. He verbally acknowledged these results. Electronically Signed   By: Neita Garnet M.D.   On: 08/14/2022 09:50        Scheduled Meds:  ALPRAZolam  0.5 mg Oral BID   amLODipine  5 mg Oral Daily   cholecalciferol  2,000 Units Oral Daily   dapagliflozin propanediol  10 mg Oral QAC breakfast   docusate sodium  100 mg Oral BID   folic acid   1 mg Oral Daily   gabapentin  100 mg Oral Daily   gabapentin  300 mg Oral QHS   insulin aspart  0-20 Units Subcutaneous TID WC   metoprolol succinate  100 mg Oral Daily   rosuvastatin  40 mg Oral QHS   senna  1 tablet Oral BID   Continuous Infusions:  sodium chloride 75 mL/hr (08/15/22 0947)   methocarbamol (ROBAXIN) IV      Osvaldo Shipper, MD [facility] 08/15/2022, 10:25 AM

## 2022-08-15 NOTE — Evaluation (Signed)
Physical Therapy Re-Evaluation Patient Details Name: DANAJHA WIEHE MRN: [id number] DOB: 17-Oct-1934 Today's Date: 08/15/2022  History of Present Illness  Pt is an 87 year old admitted with progressive bilateral upper extremity and lower extremity weakness. MRI of the cervical spine demonstrates high-grade stenosis at C3-4 C4-5 and C5-6; C3-4 and 5 6 with evidence of cord compression. NSU consulted to assess surgical intervention. 12/22 ACDF C3-6  PMH: CAD s/p CABG, Anxiety, HTN, hyperlipidemia and morbid obesity  Clinical Impression   Patient re-evaluated post-ACDF surgery. Patient overall requiring +2 mod-max assist for mobility. Able to stand enough to use bariatric stedy for transfer OOB to chair (from elevated bed). Will need maximove lift for return to bed due to low height of recliner. Anticipate patient will benefit from PT to address problems listed below.Will continue to follow acutely to maximize functional mobility independence and safety.          Recommendations for follow up therapy are one component of a multi-disciplinary discharge planning process, led by the attending physician.  Recommendations may be updated based on patient status, additional functional criteria and insurance authorization.  Follow Up Recommendations Acute inpatient rehab (3hours/day)      Assistance Recommended at Discharge Frequent or constant Supervision/Assistance  Patient can return home with the following  Two people to help with walking and/or transfers;A lot of help with bathing/dressing/bathroom;Assistance with cooking/housework;Assist for transportation;Help with stairs or ramp for entrance    Equipment Recommendations Rolling walker (2 wheels)  Recommendations for Other Services  Rehab consult    Functional Status Assessment Patient has had a recent decline in their functional status and demonstrates the ability to make significant improvements in function in a reasonable and predictable  amount of time.     Precautions / Restrictions Precautions Precautions: Fall Restrictions Weight Bearing Restrictions: No      Mobility  Bed Mobility Overal bed mobility: Needs Assistance Bed Mobility: Rolling, Sidelying to Sit Rolling: Max assist, +2 for physical assistance Sidelying to sit: Max assist, +2 for physical assistance       General bed mobility comments: increased assistance for mobility today    Transfers Overall transfer level: Needs assistance Equipment used:  (stedy) Transfers: Sit to/from Stand Sit to Stand: From elevated surface, Mod assist, +2 physical assistance           General transfer comment: pt stands with chest nearly parallel to the floor, and able to get the flaps of stedy under her for transfer to chair; maximove pad in chair for nursing to lift her back to bed    Ambulation/Gait               General Gait Details: unable  Stairs            Wheelchair Mobility    Modified Rankin (Stroke Patients Only)       Balance Overall balance assessment: Needs assistance Sitting-balance support: Bilateral upper extremity supported, Feet supported Sitting balance-Leahy Scale: Poor Sitting balance - Comments: tends to lean to right and requires minguard to mod assist to maintain midline   Standing balance support: Bilateral upper extremity supported, During functional activity Standing balance-Leahy Scale: Poor Standing balance comment: torso leans/goes to her right                             Pertinent Vitals/Pain Pain Assessment Pain Assessment: Faces Faces Pain Scale: Hurts little more Pain Location: LUE, B knees Pain Descriptors / Indicators: Discomfort, Grimacing  Pain Intervention(s): Limited activity within patient's tolerance, Monitored during session, Premedicated before session, Repositioned    Home Living Family/patient expects to be discharged to:: Private residence Living Arrangements: Other  relatives Available Help at Discharge: Available 24 hours/day;Family Type of Home: House Home Access: Stairs to enter Entrance Stairs-Rails: Right;Left;Can reach both Entrance Stairs-Number of Steps: 4   Home Layout: One level Home Equipment: Rollator (4 wheels);Cane - single point;BSC/3in1;Hand held shower head      Prior Function Prior Level of Function : Independent/Modified Independent             Mobility Comments: using a rollator for ~1 year; last fall in July ADLs Comments: for the last 2 weeks she has had assistance with dressing and bathing - pt was doing at least 50% of her ADL tasks; could still get up and walk however family would supervise; L knee buckled but pt did not fall     Hand Dominance   Dominant Hand: Left    Extremity/Trunk Assessment   Upper Extremity Assessment Upper Extremity Assessment: Defer to OT evaluation    Lower Extremity Assessment Lower Extremity Assessment: RLE deficits/detail;LLE deficits/detail RLE Deficits / Details: ankle ROM WFL; knee ROM >90; hip ROM >90; strength: ankle DF 5, knee extension 4/5, hip extension 4/5 RLE Sensation:  (denies numbness) LLE Deficits / Details: ankle ROM WFL; knee ROM >90; hip ROM >90; strength: ankle DF 5, knee extension 4/5, hip extension 4/5 LLE Sensation:  (denies numbness)    Cervical / Trunk Assessment Cervical / Trunk Assessment: Other exceptions (increased body habitus; hikes both shoulders)  Communication   Communication: No difficulties  Cognition Arousal/Alertness: Awake/alert Behavior During Therapy: WFL for tasks assessed/performed Overall Cognitive Status: Within Functional Limits for tasks assessed                                          General Comments      Exercises     Assessment/Plan    PT Assessment Patient needs continued PT services  PT Problem List Decreased strength;Decreased range of motion;Decreased activity tolerance;Decreased balance;Decreased  mobility;Decreased knowledge of use of DME;Decreased safety awareness;Decreased knowledge of precautions;Pain;Decreased coordination       PT Treatment Interventions DME instruction;Gait training;Stair training;Functional mobility training;Therapeutic activities;Therapeutic exercise;Balance training;Patient/family education;Neuromuscular re-education    PT Goals (Current goals can be found in the Care Plan section)  Acute Rehab PT Goals Patient Stated Goal: Be able to return to her home PT Goal Formulation: With patient Time For Goal Achievement: 08/29/22 Potential to Achieve Goals: Fair    Frequency Min 3X/week     Co-evaluation               AM-PAC PT "6 Clicks" Mobility  Outcome Measure Help needed turning from your back to your side while in a flat bed without using bedrails?: A Lot Help needed moving from lying on your back to sitting on the side of a flat bed without using bedrails?: Total Help needed moving to and from a bed to a chair (including a wheelchair)?: Total Help needed standing up from a chair using your arms (e.g., wheelchair or bedside chair)?: Total Help needed to walk in hospital room?: Total Help needed climbing 3-5 steps with a railing? : Total 6 Click Score: 7    End of Session   Activity Tolerance: Patient tolerated treatment well Patient left: with call bell/phone within reach;in  chair;with family/visitor present Nurse Communication: Mobility status;Need for lift equipment PT Visit Diagnosis: Unsteadiness on feet (R26.81);Pain;Muscle weakness (generalized) (M62.81);Other symptoms and signs involving the nervous system (R29.898) Pain - part of body: Shoulder;Knee    Time: [id number] PT Time Calculation (min) (ACUTE ONLY): 35 min   Charges:   PT Evaluation $PT Re-evaluation: [place]  Office [phone]   Rexanne Mano 08/15/2022, 2:49 PM

## 2022-08-15 NOTE — Evaluation (Signed)
Occupational Therapy Evaluation Patient Details Name: Erika Gates MRN: [id number] DOB: [birth year] Today's Date: 08/15/2022   History of Present Illness Pt is an 87 year old admitted with progressive bilateral upper extremity and lower extremity weakness. MRI of the cervical spine demonstrates high-grade stenosis at C3-4 C4-5 and C5-6; C3-4 and 5 6 with evidence of cord compression. NSU consulted to assess surgical intervention. 12/22 ACDF C3-6  PMH: CAD s/p CABG, Anxiety, HTN, hyperlipidemia and morbid obesity   Clinical Impression   Pt re-evaluated s/p ACDF. Pt requiring mod-max A +2 for bed mobility and STS transfers with use of stedy. Pt requiring up to max A for grooming and self feeding tasks at this time, but will benefit from education re:AE for feeding/grooming. Pt requiring mod A for UB ADL and max A for LB ADL. Pt with decreased strength in BUE and BLE (see comments below). Pt very motivated and with good support. Recommending discharge to AIR to optimize safety and independence with ADL and IADL.      Recommendations for follow up therapy are one component of a multi-disciplinary discharge planning process, led by the attending physician.  Recommendations may be updated based on patient status, additional functional criteria and insurance authorization.   Follow Up Recommendations  Acute inpatient rehab (3hours/day)     Assistance Recommended at Discharge Frequent or constant Supervision/Assistance  Patient can return home with the following A lot of help with walking and/or transfers;A lot of help with bathing/dressing/bathroom;Assistance with feeding;Assistance with cooking/housework;Assist for transportation;Help with stairs or ramp for entrance    Functional Status Assessment  Patient has had a recent decline in their functional status and demonstrates the ability to make significant improvements in function in a reasonable and predictable amount of time.  Equipment  Recommendations  Tub/shower bench;Wheelchair (measurements OT);Wheelchair cushion (measurements OT);Hospital bed    Recommendations for Other Services Rehab consult     Precautions / Restrictions Precautions Precautions: Fall Restrictions Weight Bearing Restrictions: No      Mobility Bed Mobility Overal bed mobility: Needs Assistance Bed Mobility: Rolling, Sidelying to Sit Rolling: Max assist, +2 for physical assistance Sidelying to sit: Max assist, +2 for physical assistance       General bed mobility comments: increased assistance for mobility today    Transfers Overall transfer level: Needs assistance Equipment used:  (stedy) Transfers: Sit to/from Stand Sit to Stand: From elevated surface, Mod assist, +2 physical assistance           General transfer comment: pt stands with chest nearly parallel to the floor, and able to get the flaps of stedy under her for transfer to chair; maximove pad in chair for nursing to lift her back to bed      Balance Overall balance assessment: Needs assistance Sitting-balance support: Bilateral upper extremity supported, Feet supported Sitting balance-Leahy Scale: Poor Sitting balance - Comments: tends to lean to right and requires minguard to mod assist to maintain midline   Standing balance support: Bilateral upper extremity supported, During functional activity Standing balance-Leahy Scale: Poor Standing balance comment: torso leans/goes to her right                           ADL either performed or assessed with clinical judgement   ADL Overall ADL's : Needs assistance/impaired Eating/Feeding: Maximal assistance   Grooming: Maximal assistance   Upper Body Bathing: Maximal assistance   Lower Body Bathing: Total assistance   Upper Body Dressing : Maximal assistance  Lower Body Dressing: Total assistance   Toilet Transfer: Maximal assistance;+2 for physical assistance Toilet Transfer Details (indicate cue  type and reason): simulated with use of STEDY Toileting- Clothing Manipulation and Hygiene: Total assistance       Functional mobility during ADLs: Maximal assistance;+2 for physical assistance       Vision Baseline Vision/History: 1 Wears glasses       Perception     Praxis      Pertinent Vitals/Pain Pain Assessment Pain Assessment: Faces Faces Pain Scale: Hurts little more Pain Location: LUE, B knees Pain Descriptors / Indicators: Discomfort, Grimacing Pain Intervention(s): Limited activity within patient's tolerance, Monitored during session, Repositioned     Hand Dominance Left   Extremity/Trunk Assessment Upper Extremity Assessment Upper Extremity Assessment: RUE deficits/detail;LUE deficits/detail RUE Deficits / Details: shoulder 2/5; elbow flexion 2+/5; ext 3/5; wrist 3+/5; gross grasp/release; more difficulty with in-hand manipulation skills; decreased abduction and extension of thumb; able to oppose tips of digits on 2nd anf third dogit, but not 4th and fth; compensation with PROM shoulder flexion resulting in shoulder hike RUE Coordination: decreased fine motor;decreased gross motor LUE Deficits / Details: 1/5 shoulder; elbow flexion 2-/5, ext 3+/5; wrist flex/ext ROM WFL; grip strength 4/5; able to oppose all digits; decreased in-hand manipulation, hikes shoulder with PROM shoulder flexion LUE Coordination: decreased gross motor;decreased fine motor   Lower Extremity Assessment Lower Extremity Assessment: Defer to PT evaluation RLE Deficits / Details: ankle ROM WFL; knee ROM >90; hip ROM >90; strength: ankle DF 5, knee extension 4/5, hip extension 4/5 RLE Sensation:  (denies numbness) LLE Deficits / Details: ankle ROM WFL; knee ROM >90; hip ROM >90; strength: ankle DF 5, knee extension 4/5, hip extension 4/5 LLE Sensation:  (denies numbness)   Cervical / Trunk Assessment Cervical / Trunk Assessment: Other exceptions (increased body habitus; hikes both  shoulders)   Communication Communication Communication: No difficulties   Cognition Arousal/Alertness: Awake/alert Behavior During Therapy: WFL for tasks assessed/performed Overall Cognitive Status: Within Functional Limits for tasks assessed                                       General Comments       Exercises     Shoulder Instructions      Home Living Family/patient expects to be discharged to:: Private residence Living Arrangements: Other relatives Available Help at Discharge: Available 24 hours/day;Family Type of Home: House Home Access: Stairs to enter Secretary/administrator of Steps: 4 Entrance Stairs-Rails: Right;Left;Can reach both Home Layout: One level     Bathroom Shower/Tub: Tub/shower unit;Walk-in shower   Bathroom Toilet: Handicapped height Bathroom Accessibility: Yes   Home Equipment: Rollator (4 wheels);Cane - single point;BSC/3in1;Hand held shower head          Prior Functioning/Environment Prior Level of Function : Independent/Modified Independent             Mobility Comments: using a rollator for ~1 year; last fall in July ADLs Comments: for the last 2 weeks she has had assistance with dressing and bathing - pt was doing at least 50% of her ADL tasks; could still get up and walk however family would supervise; L knee buckled but pt did not fall        OT Problem List: Decreased strength;Decreased range of motion;Decreased activity tolerance;Impaired balance (sitting and/or standing);Decreased coordination;Decreased knowledge of use of DME or AE;Impaired sensation;Obesity;Impaired UE functional use;Pain  OT Treatment/Interventions: Self-care/ADL training;Therapeutic exercise;Neuromuscular education;DME and/or AE instruction;Therapeutic activities;Patient/family education;Balance training    OT Goals(Current goals can be found in the care plan section) Acute Rehab OT Goals Patient Stated Goal: get in chair OT Goal  Formulation: With patient/family Time For Goal Achievement: 08/26/22 Potential to Achieve Goals: Good  OT Frequency: Min 2X/week    Co-evaluation              AM-PAC OT "6 Clicks" Daily Activity     Outcome Measure Help from another person eating meals?: A Lot Help from another person taking care of personal grooming?: A Lot Help from another person toileting, which includes using toliet, bedpan, or urinal?: Total Help from another person bathing (including washing, rinsing, drying)?: A Lot Help from another person to put on and taking off regular upper body clothing?: A Lot Help from another person to put on and taking off regular lower body clothing?: Total 6 Click Score: 10   End of Session Equipment Utilized During Treatment: Gait belt (stedy) Nurse Communication: Mobility status;Need for lift equipment  Activity Tolerance: Patient tolerated treatment well Patient left: in chair;with call bell/phone within reach;with family/visitor present  OT Visit Diagnosis: Unsteadiness on feet (R26.81);Other abnormalities of gait and mobility (R26.89);Muscle weakness (generalized) (M62.81);Pain Pain - part of body:  (BLE, L arm)                Time: 2876-8115 OT Time Calculation (min): 38 min Charges:  OT General Charges $OT Visit: 1 Visit OT Evaluation $OT Re-eval: 1 Re-eval  Tyler Deis, OTR/L [facility] Acute Rehabilitation Office: [phone]   Myrla Halsted 08/15/2022, 3:06 PM

## 2022-08-15 NOTE — Progress Notes (Signed)
Postop day 1.  No new events or problems overnight.  Pain well-controlled.  Moderate dysphagia.  No airway issues.  Patient reports improved strength in her right upper extremity.  Left upper extremity proximal weakness unchanged but better control of her left hand.  Lower extremity strength about the same.  She is afebrile.  Her vital signs are stable.  She is awake and alert.  Wound clean and dry.  Expected soft tissue swelling and bruising.  Breathing easily.  Motor 4+/5 strength in her right upper extremity, improved.  Left deltoid 0/5.  2/5 left bicep strength.  4-/5 strength in her left grip and intrinsic.  Lower extremity strength 4 to 4+/5.  Progressing well.  Begin efforts at mobilization.

## 2022-08-15 NOTE — Progress Notes (Signed)
Inpatient Rehab Admissions Coordinator:    I met with pt. And daughter To discuss potential CIR admit. Pt.'s daughter does state that Pt. Has 24/7 support at home if needed; however, Pt has not yet worked with PT/OT post-op, so we will see how she tolerates and go from there. Daughter also seems open to SNF if that is determined to be most appropriate venue.   Clemens Catholic, [place], [place] Admissions Coordinator  [phone] ([place]) [phone] (office)

## 2022-08-16 ENCOUNTER — Inpatient Hospital Stay (HOSPITAL_COMMUNITY): Payer: Medicare PPO

## 2022-08-16 DIAGNOSIS — I469 Cardiac arrest, cause unspecified: Secondary | ICD-10-CM

## 2022-08-16 LAB — CBC WITH DIFFERENTIAL/PLATELET
Abs Immature Granulocytes: 0.05 10*3/uL (ref 0.00–0.07)
Basophils Absolute: 0 10*3/uL (ref 0.0–0.1)
Basophils Relative: 0 %
Eosinophils Absolute: 0 10*3/uL (ref 0.0–0.5)
Eosinophils Relative: 0 %
HCT: 28.5 % — ABNORMAL LOW (ref 36.0–46.0)
Hemoglobin: 8.6 g/dL — ABNORMAL LOW (ref 12.0–15.0)
Immature Granulocytes: 1 %
Lymphocytes Relative: 4 %
Lymphs Abs: 0.4 10*3/uL — ABNORMAL LOW (ref 0.7–4.0)
MCH: 28.4 pg (ref 26.0–34.0)
MCHC: 30.2 g/dL (ref 30.0–36.0)
MCV: 94.1 fL (ref 80.0–100.0)
Monocytes Absolute: 1.1 10*3/uL — ABNORMAL HIGH (ref 0.1–1.0)
Monocytes Relative: 13 %
Neutro Abs: 7 10*3/uL (ref 1.7–7.7)
Neutrophils Relative %: 82 %
Platelets: DECREASED 10*3/uL (ref 150–400)
RBC: 3.03 MIL/uL — ABNORMAL LOW (ref 3.87–5.11)
RDW: 15.6 % — ABNORMAL HIGH (ref 11.5–15.5)
WBC: 8.5 10*3/uL (ref 4.0–10.5)
nRBC: 0 % (ref 0.0–0.2)

## 2022-08-16 LAB — COMPREHENSIVE METABOLIC PANEL
ALT: 100 U/L — ABNORMAL HIGH (ref 0–44)
AST: 201 U/L — ABNORMAL HIGH (ref 15–41)
Albumin: <1.5 g/dL — ABNORMAL LOW (ref 3.5–5.0)
Alkaline Phosphatase: 34 U/L — ABNORMAL LOW (ref 38–126)
Anion gap: 12 (ref 5–15)
BUN: 28 mg/dL — ABNORMAL HIGH (ref 8–23)
CO2: 13 mmol/L — ABNORMAL LOW (ref 22–32)
Calcium: 8.1 mg/dL — ABNORMAL LOW (ref 8.9–10.3)
Chloride: 108 mmol/L (ref 98–111)
Creatinine, Ser: 0.87 mg/dL (ref 0.44–1.00)
GFR, Estimated: >60 mL/min (ref >60)
Glucose, Bld: 111 mg/dL — ABNORMAL HIGH (ref 70–99)
Potassium: 4.6 mmol/L (ref 3.5–5.1)
Sodium: 133 mmol/L — ABNORMAL LOW (ref 135–145)
Total Bilirubin: 0.3 mg/dL (ref 0.3–1.2)
Total Protein: 3.2 g/dL — ABNORMAL LOW (ref 6.5–8.1)

## 2022-08-16 LAB — BLOOD GAS, ARTERIAL
Acid-base deficit: 11.9 mmol/L — ABNORMAL HIGH (ref 0.0–2.0)
Bicarbonate: 13.9 mmol/L — ABNORMAL LOW (ref 20.0–28.0)
Drawn by: 418751
O2 Saturation: 71.9 %
Patient temperature: 37
pCO2 arterial: 31 mmHg — ABNORMAL LOW (ref 32–48)
pH, Arterial: 7.26 — ABNORMAL LOW (ref 7.35–7.45)
pO2, Arterial: 42 mmHg — ABNORMAL LOW (ref 83–108)

## 2022-08-16 LAB — MAGNESIUM: Magnesium: 1.4 mg/dL — ABNORMAL LOW (ref 1.7–2.4)

## 2022-08-16 LAB — GLUCOSE, CAPILLARY
Glucose-Capillary: 105 mg/dL — ABNORMAL HIGH (ref 70–99)
Glucose-Capillary: 112 mg/dL — ABNORMAL HIGH (ref 70–99)
Glucose-Capillary: 127 mg/dL — ABNORMAL HIGH (ref 70–99)
Glucose-Capillary: 131 mg/dL — ABNORMAL HIGH (ref 70–99)
Glucose-Capillary: 136 mg/dL — ABNORMAL HIGH (ref 70–99)
Glucose-Capillary: 144 mg/dL — ABNORMAL HIGH (ref 70–99)

## 2022-08-16 LAB — TROPONIN I (HIGH SENSITIVITY): Troponin I (High Sensitivity): 61 ng/L — ABNORMAL HIGH (ref <18)

## 2022-08-16 LAB — BASIC METABOLIC PANEL
Anion gap: 4 — ABNORMAL LOW (ref 5–15)
BUN: 38 mg/dL — ABNORMAL HIGH (ref 8–23)
CO2: 26 mmol/L (ref 22–32)
Calcium: 9.4 mg/dL (ref 8.9–10.3)
Chloride: 106 mmol/L (ref 98–111)
Creatinine, Ser: 1.25 mg/dL — ABNORMAL HIGH (ref 0.44–1.00)
GFR, Estimated: 42 mL/min — ABNORMAL LOW (ref >60)
Glucose, Bld: 114 mg/dL — ABNORMAL HIGH (ref 70–99)
Potassium: 4.8 mmol/L (ref 3.5–5.1)
Sodium: 136 mmol/L (ref 135–145)

## 2022-08-16 LAB — CBC
HCT: 32.8 % — ABNORMAL LOW (ref 36.0–46.0)
Hemoglobin: 9.9 g/dL — ABNORMAL LOW (ref 12.0–15.0)
MCH: 28.3 pg (ref 26.0–34.0)
MCHC: 30.2 g/dL (ref 30.0–36.0)
MCV: 93.7 fL (ref 80.0–100.0)
Platelets: 117 10*3/uL — ABNORMAL LOW (ref 150–400)
RBC: 3.5 MIL/uL — ABNORMAL LOW (ref 3.87–5.11)
RDW: 15.7 % — ABNORMAL HIGH (ref 11.5–15.5)
WBC: 8.4 10*3/uL (ref 4.0–10.5)
nRBC: 0 % (ref 0.0–0.2)

## 2022-08-16 LAB — LACTIC ACID, PLASMA: Lactic Acid, Venous: >9 mmol/L (ref 0.5–1.9)

## 2022-08-16 LAB — MRSA NEXT GEN BY PCR, NASAL: MRSA by PCR Next Gen: NOT DETECTED

## 2022-08-16 MED ORDER — DEXMEDETOMIDINE HCL IN NACL 400 MCG/100ML IV SOLN
0.0000 ug/kg/h | INTRAVENOUS | Status: DC
  Administered 2022-08-16: 0.4 ug/kg/h via INTRAVENOUS
  Filled 2022-08-16: qty 100

## 2022-08-16 MED ORDER — FENTANYL CITRATE PF 50 MCG/ML IJ SOSY: 25.0000 ug | PREFILLED_SYRINGE | INTRAMUSCULAR | Status: DC | PRN

## 2022-08-16 MED ORDER — VITAMIN D 25 MCG (1000 UNIT) PO TABS
2000.0000 [IU] | ORAL_TABLET | Freq: Every day | ORAL | Status: DC
  Administered 2022-08-16 – 2022-08-17 (×2): 2000 [IU]
  Filled 2022-08-16 (×2): qty 2

## 2022-08-16 MED ORDER — NOREPINEPHRINE 4 MG/250ML-% IV SOLN: 2.0000 ug/min | INTRAVENOUS | Status: DC

## 2022-08-16 MED ORDER — METOPROLOL TARTRATE 25 MG/10 ML ORAL SUSPENSION
25.0000 mg | Freq: Two times a day (BID) | ORAL | Status: DC
  Administered 2022-08-16 – 2022-08-17 (×3): 25 mg
  Filled 2022-08-16 (×4): qty 10

## 2022-08-16 MED ORDER — INSULIN ASPART 100 UNIT/ML IJ SOLN
0.0000 [IU] | INTRAMUSCULAR | Status: DC
  Administered 2022-08-16 (×3): 2 [IU] via SUBCUTANEOUS

## 2022-08-16 MED ORDER — POLYETHYLENE GLYCOL 3350 17 G PO PACK
17.0000 g | PACK | Freq: Every day | ORAL | Status: DC
  Administered 2022-08-17: 17 g
  Filled 2022-08-16: qty 1

## 2022-08-16 MED ORDER — SODIUM CHLORIDE 0.9 % IV BOLUS
500.0000 mL | Freq: Once | INTRAVENOUS | Status: AC
  Administered 2022-08-16: 500 mL via INTRAVENOUS

## 2022-08-16 MED ORDER — SENNA 8.6 MG PO TABS
1.0000 | ORAL_TABLET | Freq: Two times a day (BID) | ORAL | Status: DC
  Administered 2022-08-16 – 2022-08-17 (×3): 8.6 mg
  Filled 2022-08-16 (×3): qty 1

## 2022-08-16 MED ORDER — DOCUSATE SODIUM 50 MG/5ML PO LIQD
100.0000 mg | Freq: Two times a day (BID) | ORAL | Status: DC
  Filled 2022-08-16 (×2): qty 10

## 2022-08-16 MED ORDER — SODIUM CHLORIDE 0.9 % IV SOLN: 250.0000 mL | INTRAVENOUS | Status: DC

## 2022-08-16 MED ORDER — CHLORHEXIDINE GLUCONATE CLOTH 2 % EX PADS
6.0000 | MEDICATED_PAD | Freq: Every day | CUTANEOUS | Status: DC
  Administered 2022-08-16 – 2022-08-22 (×6): 6 via TOPICAL

## 2022-08-16 MED ORDER — METOPROLOL TARTRATE 5 MG/5ML IV SOLN
2.5000 mg | Freq: Once | INTRAVENOUS | Status: AC
  Administered 2022-08-16: 2.5 mg via INTRAVENOUS
  Filled 2022-08-16: qty 5

## 2022-08-16 MED ORDER — POLYETHYLENE GLYCOL 3350 17 G PO PACK
17.0000 g | PACK | Freq: Every day | ORAL | Status: DC
  Filled 2022-08-16: qty 1

## 2022-08-16 MED ORDER — FOLIC ACID 1 MG PO TABS
1.0000 mg | ORAL_TABLET | Freq: Every day | ORAL | Status: DC
  Administered 2022-08-16 – 2022-08-17 (×2): 1 mg
  Filled 2022-08-16 (×2): qty 1

## 2022-08-16 MED ORDER — ACETAMINOPHEN 325 MG PO TABS: 650.0000 mg | ORAL_TABLET | ORAL | Status: DC | PRN

## 2022-08-16 MED ORDER — DOCUSATE SODIUM 50 MG/5ML PO LIQD
100.0000 mg | Freq: Two times a day (BID) | ORAL | Status: DC
  Administered 2022-08-16 – 2022-08-17 (×2): 100 mg
  Filled 2022-08-16 (×2): qty 10

## 2022-08-16 MED ORDER — NOREPINEPHRINE 4 MG/250ML-% IV SOLN
INTRAVENOUS | Status: AC
  Administered 2022-08-16: 4 mg
  Filled 2022-08-16: qty 250

## 2022-08-16 MED ORDER — ROSUVASTATIN CALCIUM 20 MG PO TABS
40.0000 mg | ORAL_TABLET | Freq: Every day | ORAL | Status: DC
  Administered 2022-08-16 – 2022-08-17 (×2): 40 mg
  Filled 2022-08-16 (×2): qty 2

## 2022-08-16 MED ORDER — ONDANSETRON HCL 4 MG/2ML IJ SOLN: 4.0000 mg | Freq: Four times a day (QID) | INTRAMUSCULAR | Status: DC | PRN

## 2022-08-16 MED ORDER — GABAPENTIN 250 MG/5ML PO SOLN
100.0000 mg | Freq: Every day | ORAL | Status: DC
  Administered 2022-08-16 – 2022-08-17 (×2): 100 mg
  Filled 2022-08-16 (×2): qty 2

## 2022-08-16 MED ORDER — PANTOPRAZOLE SODIUM 40 MG IV SOLR
40.0000 mg | INTRAVENOUS | Status: DC
  Administered 2022-08-16 – 2022-08-19 (×4): 40 mg via INTRAVENOUS
  Filled 2022-08-16 (×4): qty 10

## 2022-08-16 MED ORDER — ONDANSETRON HCL 4 MG PO TABS: 4.0000 mg | ORAL_TABLET | Freq: Four times a day (QID) | ORAL | Status: DC | PRN

## 2022-08-16 MED ORDER — GABAPENTIN 250 MG/5ML PO SOLN
100.0000 mg | Freq: Every day | ORAL | Status: DC
  Filled 2022-08-16: qty 2

## 2022-08-16 MED ORDER — SODIUM CHLORIDE 0.9 % IV SOLN: INTRAVENOUS | Status: DC

## 2022-08-16 MED ORDER — ACETAMINOPHEN 650 MG RE SUPP: 650.0000 mg | RECTAL | Status: DC | PRN

## 2022-08-16 MED ORDER — SODIUM CHLORIDE 0.9 % IV BOLUS
1000.0000 mL | Freq: Once | INTRAVENOUS | Status: AC
  Administered 2022-08-16: 1000 mL via INTRAVENOUS

## 2022-08-16 MED ORDER — LACTATED RINGERS IV BOLUS
1000.0000 mL | Freq: Once | INTRAVENOUS | Status: AC
  Administered 2022-08-16: 1000 mL via INTRAVENOUS

## 2022-08-16 NOTE — Progress Notes (Signed)
Primary RN was paged to room by grandson whom was at the bedside. Patient found to be unresponsive, pulseless, and not breathing. A code was called immediately. CPR and ACLS protocol started. Patient transferred to a higher level of care. Handoff to critical care RN.

## 2022-08-16 NOTE — Progress Notes (Signed)
Subjective: PEA arrest 6 am this morning, ROSC after 7 minutes  Objective: Vital signs in last 24 hours: Temp:  [96.8 F (36 C)-99.1 F (37.3 C)] 99.1 F (37.3 C) (12/24 0437) Pulse Rate:  [66-133] 109 (12/24 1030) Resp:  [14-20] 19 (12/24 1030) BP: (93-144)/(47-109) 122/67 (12/24 1030) SpO2:  [95 %-100 %] 99 % (12/24 1030) FiO2 (%):  [40 %-100 %] 40 % (12/24 0804)  Intake/Output from previous day: 12/23 0701 - 12/24 0700 In: 1088.1 [P.O.:120; I.V.:476.4; IV Piggyback:491.7] Out: 1825 [Urine:1825] Intake/Output this shift: Total I/O In: 1494.3 [IV Piggyback:1494.3] Out: -  Intubated Eyes open to stim but does not regard Does not follow commands centrally No movement to stimulus Ues or Les Ecchymoses in anterior neck, some firmness and induration, no hematoma.  Lab Results: Recent Labs    08/15/22 0234 08/16/22 0245  WBC 9.6 8.4  HGB 10.3* 9.9*  HCT 32.7* 32.8*  PLT 120* 117*   BMET Recent Labs    08/16/22 0245 08/16/22 0755  NA 136 133*  K 4.8 4.6  CL 106 108  CO2 26 13*  GLUCOSE 114* 111*  BUN 38* 28*  CREATININE 1.25* 0.87  CALCIUM 9.4 8.1*    Studies/Results: DG Abd Portable 1V  Result Date: 08/16/2022 CLINICAL DATA:  OG tube placement EXAM: PORTABLE ABDOMEN - 1 VIEW COMPARISON:  None Available. FINDINGS: There is an enteric tube with tip and side port in the expected location of the distal stomach. The tube appears looped within the gastric fundus. Decreased aeration to the right lower lung is again noted compatible with atelectasis or infiltrate. IMPRESSION: Enteric tube tip and side port are in the expected location of the distal stomach. Electronically Signed   By: Signa Kell M.D.   On: 08/16/2022 09:17   DG CHEST PORT 1 VIEW  Result Date: 08/16/2022 CLINICAL DATA:  Intubation EXAM: PORTABLE CHEST 1 VIEW COMPARISON:  Six days ago FINDINGS: New endotracheal tube with tip just below the clavicular heads. Artifact from overlapping hardware.  Cardiomegaly.  CABG. Airspace disease at the bases superimposed on the chronically elevated right diaphragm. No effusion or pneumothorax. IMPRESSION: 1. Unremarkable endotracheal tube positioning. 2. Worsening aeration at the bases which could be atelectasis or infiltrate. Electronically Signed   By: Tiburcio Pea M.D.   On: 08/16/2022 07:22   DG Cervical Spine 1 View  Result Date: 08/14/2022 CLINICAL DATA:  Anterior cervical fusion EXAM: DG CERVICAL SPINE - 1 VIEW COMPARISON:  None Available. FINDINGS: Single cross-table lateral x-ray of the cervical spine. Anterior cervical fusion from C3 through C6. Endotracheal tube noted. IMPRESSION: 1. Anterior cervical fusion from C3 through C6. Electronically Signed   By: Elige Ko M.D.   On: 08/14/2022 15:11    Assessment/Plan: 87 yo F s/p C3-6 ACDF s/p PEA arrest - continue supportive care for now; neurologic prognosis unclear at this point   LOS: 3 days     Erika Gates 08/16/2022, 10:45 AM

## 2022-08-16 NOTE — Procedures (Signed)
Intubation Procedure Note  Erika Gates  [id number]  09/05/1934  Date:08/16/22  Time:6:26 AM   Provider Performing:Teofilo Lupinacci, Chales Abrahams    Procedure: Intubation (31500)  Indication(s) Respiratory Failure  ConsentUnable to obtain consent due to emergent nature of procedure.    Anesthesia None   Time Out Verified patient identification, verified procedure, site/side was marked, verified correct patient position, special equipment/implants available, medications/allergies/relevant history reviewed, required imaging and test results available.   Sterile Technique Usual hand hygeine, masks, and gloves were used   Procedure Description Patient positioned in bed supine.  Sedation given as noted above.  Patient was intubated with endotracheal tube using  3 Miller .  View was Grade 2 only posterior commissure .  Number of attempts was 1.  Colorimetric CO2 detector was consistent with tracheal placement.BLB sounds   Complications/Tolerance None; patient tolerated the procedure well. Chest X-ray is ordered to verify placement.   EBL Minimal   Specimen(s) None

## 2022-08-16 NOTE — Progress Notes (Signed)
eLink Physician-Brief Progress Note Patient Name: Erika Gates DOB: November 30, 1934 MRN: [id number]   Date of Service  08/16/2022  HPI/Events of Note  Had a lactic acid >9 at 0755. No recheck. Want to recheck a lactic in the morning? S/p PEA arrest.  eICU Interventions  LA for mid night ordered.     Intervention Category Minor Interventions: Other: (post PEA LA)  Ranee Gosselin 08/16/2022, 10:53 PM

## 2022-08-16 NOTE — Progress Notes (Signed)
Patient arrived on unit with medical team s/p arrest. Intubated and unresponsive at this time. Grandson in room and aware

## 2022-08-16 NOTE — Progress Notes (Signed)
Updated at bedside

## 2022-08-16 NOTE — Progress Notes (Signed)
An USGPIV (ultrasound guided PIV) has been placed for short-term vasopressor infusion. A correctly placed ivWatch must be used when administering Vasopressors. Should this treatment be needed beyond 72 hours, central line access should be obtained.  It will be the responsibility of the bedside nurse to follow best practice to prevent extravasations.   ?

## 2022-08-16 NOTE — Progress Notes (Signed)
   08/16/22 0606  Clinical Encounter Type  Visited With Patient not available;Health care provider;Family  Visit Type Code;Initial  Referral From Nurse  Consult/Referral To Chaplain Melvenia Beam)  Recommendations CODE BLUE  Spiritual Encounters  Spiritual Needs Emotional   Chaplain responded to CODE BLUE Page. Upon arrival medical team were providing Resuscitation efforts for patient. Chaplain met with patient's Grandson: Myrtie Soman, who was with patient at time of [place], provided meaningful presence and offered hospitality. Chaplain will be available as family needs arise. [address] [place], [place] [phone]

## 2022-08-16 NOTE — Progress Notes (Signed)
Elevated lactate  Give 1 L of saline bolus Obtain ABG  Echo from 20 22 shows normal ejection fraction

## 2022-08-16 NOTE — Progress Notes (Signed)
  Patient Name: Erika Gates   MRN: [id number]   Date of Birth/ Sex: 1935-02-16 , female      Admission Date: 08/10/2022  Attending Provider: Osvaldo Shipper, MD  Primary Diagnosis: Upper extremity weakness   Indication: Pt was in her usual state of health until this 0606 AM, when she was noted to be unresponsive. Code blue was subsequently called. At the time of arrival on scene, ACLS protocol was underway.   Technical Description:  - CPR performance duration:  7 minutes  - Was defibrillation or cardioversion used? No   - Was external pacer placed? No  - Was patient intubated pre/post CPR? Yes   Medications Administered: Y = Yes; Blank = No Amiodarone    Atropine    Calcium  Y  Epinephrine  Y  Lidocaine    Magnesium    Norepinephrine    Phenylephrine    Sodium bicarbonate    Vasopressin     Post CPR evaluation:  - Final Status - Was patient successfully resuscitated ? Yes - What is current rhythm? Normal Sinus - What is current hemodynamic status? HR 100, MAP 65, RR 20, SpO2 100%  Miscellaneous Information:  - Labs sent, including: BMP, magnesium  - Primary team notified?  Yes  - Family Notified? Yes  - Additional notes/ transfer status: Transferred to ICU, CXR ordered     Karoline Caldwell, MD  08/16/2022, 6:40 AM

## 2022-08-16 NOTE — Consult Note (Signed)
NAME:  Erika Gates, MRN:  [id number], DOB:  1934-11-10, LOS: 3 ADMISSION DATE:  08/10/2022, CONSULTATION DATE: 08/16/2022 REFERRING MD: Dr. Rito Ehrlich, CHIEF COMPLAINT: Postarrest  History of Present Illness:  Patient is supposed anterior cervical decompression and arthrodesis with structural space and allograft, anterior plate fixation [id number] on 08/14/2022. Admitted with generalized weakness, pain in shoulders and knee CT scan showed severe multilevel degenerative disease for which she had surgery on 12/22. Was doing well on the medical floor, early this morning noted to have alteration in her mental status, grandson at bedside activated nursing service, patient deteriorated and went into PEA arrest Had 7 minutes of resuscitation before achieving ROSC Transferred to ICU for further management Currently on vent, not on pressors, not on any sedation at present  Pertinent  Medical History   Past Medical History:  Diagnosis Date   Allergic rhinitis    Anemia    Anxiety    Coronary atherosclerosis of unspecified type of vessel, native or graft    Diabetes mellitus    DJD (degenerative joint disease)    Hypercholesteremia    Hypertension    Low back pain    Morbid obesity (HCC)    Venous insufficiency    Vitamin D deficiency     Significant Hospital Events: Including procedures, antibiotic start and stop dates in addition to other pertinent events   12/22 anterior cervical decompression and arthrodesis 12/24 PEA arrest, achieved ROSC after 7 minutes  Interim History / Subjective:  Unresponsive, on vent, not agitated  Objective   Blood pressure (!) 130/53, pulse 95, temperature 99.1 F (37.3 C), temperature source Oral, resp. rate 19, height 5\' 1"  (1.549 m), weight 103 kg, SpO2 100 %.    Vent Mode: PRVC FiO2 (%):  [40 %-100 %] 40 % Set Rate:  [16 bmp] 16 bmp Vt Set:  [380 mL] 380 mL PEEP:  [5 cmH20] 5 cmH20 Plateau Pressure:  [13 cmH20-15 cmH20] 15 cmH20    Intake/Output Summary (Last 24 hours) at 08/16/2022 0815 Last data filed at 08/16/2022 0459 Gross per 24 hour  Intake 1088.07 ml  Output 1825 ml  Net -736.93 ml   Filed Weights   08/14/22 1215  Weight: 103 kg    Examination: General: Elderly lady, does not appear to be in distress HENT: Tracheal tube in place, moist oral mucosa, erythema around surgical site Lungs: Clear breath sounds bilaterally Cardiovascular: S1-S2 appreciated Abdomen: Soft, bowel sounds appreciated Extremities: No clubbing, no edema Neuro: Not on any sedation, unresponsive, breathing above vent, not responsive to pain GU:   Resolved Hospital Problem list     Assessment & Plan:  Post PEA arrest, lasted 6 minutes -Continue full vent support -This may have happened post inability to clear secretions according to grandson who was at bedside before event -She was still responsive before she got the nurses to help  S/p cervical spine decompression for cervical spine stenosis, bilateral upper extremity weakness -Surgical intervention 08/14/2022. -She was stable and had plans for physical therapy to be initiated prior to PEA event  History of coronary artery disease status post CABG/hyperlipidemia -Was on metoprolol and statin -Will reinitiate once stable  History of hypertension -Continue to monitor at present -On amlodipine, metoprolol, lisinopril-on hold at present  Prerenal azotemia -Continue to monitor renal parameters -ACE inhibitor on hold  Thrombocytopenia -Has been stable  Was fully responsive and active prior to PEA arrest Discussed with grandson at bedside, hopefully no anoxic injury, time will tell   Best Practice (  right click and "Reselect all SmartList Selections" daily)   Diet/type: NPO DVT prophylaxis: SCD GI prophylaxis: H2B Lines: N/A Foley:  N/A Code Status:  full code Last date of multidisciplinary goals of care discussion [pending]  Labs   CBC: Recent Labs  Lab  08/10/22 1649 08/11/22 0143 08/12/22 0257 08/15/22 0234 08/16/22 0245  WBC 4.9 5.4 7.5 9.6 8.4  NEUTROABS 2.8  --  5.9 8.2*  --   HGB 11.2* 11.4* 11.7* 10.3* 9.9*  HCT 38.7 38.0 37.0 32.7* 32.8*  MCV 95.8 93.6 89.8 92.1 93.7  PLT 119* 107* 105* 120* 117*    Basic Metabolic Panel: Recent Labs  Lab 08/10/22 1649 08/12/22 0257 08/15/22 0234 08/16/22 0245  NA 145 140 135 136  K 4.7 4.1 5.3* 4.8  CL 111 107 103 106  CO2 26 26 26 26   GLUCOSE 82 121* 178* 114*  BUN 29* 16 30* 38*  CREATININE 0.90 0.72 1.08* 1.25*  CALCIUM 10.0 10.1 9.4 9.4  MG  --  2.1 2.3  --    GFR: Estimated Creatinine Clearance: 36.3 mL/min (A) (by C-G formula based on SCr of 1.25 mg/dL (H)). Recent Labs  Lab 08/10/22 1649 08/11/22 0143 08/12/22 0257 08/15/22 0234 08/16/22 0245  WBC 4.9 5.4 7.5 9.6 8.4  LATICACIDVEN 0.9  --   --   --   --     Liver Function Tests: Recent Labs  Lab 08/10/22 1649  AST 30  ALT 24  ALKPHOS 81  BILITOT 0.5  PROT 6.6  ALBUMIN 3.2*   No results for input(s): "LIPASE", "AMYLASE" in the last 168 hours. No results for input(s): "AMMONIA" in the last 168 hours.  ABG    Component Value Date/Time   PHART 7.247 (L) 07/14/2021 1518   PCO2ART 58.4 (H) 07/14/2021 1518   PO2ART 52 (L) 07/14/2021 1518   HCO3 25.5 07/14/2021 1518   TCO2 27 07/14/2021 1518   ACIDBASEDEF 2.0 07/14/2021 1518   O2SAT 81.0 07/14/2021 1518     Coagulation Profile: No results for input(s): "INR", "PROTIME" in the last 168 hours.  Cardiac Enzymes: Recent Labs  Lab 08/10/22 1649  CKTOTAL 88    HbA1C: Hgb A1c MFr Bld  Date/Time Value Ref Range Status  08/12/2022 02:53 AM 6.2 (H) 4.8 - 5.6 % Final    Comment:    (NOTE)         Prediabetes: 5.7 - 6.4         Diabetes: >6.4         Glycemic control for adults with diabetes: <7.0   07/14/2021 08:24 AM 5.6 4.8 - 5.6 % Final    Comment:    (NOTE) Pre diabetes:          5.7%-6.4%  Diabetes:              >6.4%  Glycemic control  for   <7.0% adults with diabetes     CBG: Recent Labs  Lab 08/15/22 1118 08/15/22 1637 08/15/22 2110 08/16/22 0610 08/16/22 0746  GLUCAP 132* 107* 155* 131* 136*    Review of Systems:   Unable to provide  Past Medical History:  She,  has a past medical history of Allergic rhinitis, Anemia, Anxiety, Coronary atherosclerosis of unspecified type of vessel, native or graft, Diabetes mellitus, DJD (degenerative joint disease), Hypercholesteremia, Hypertension, Low back pain, Morbid obesity ([place]), Venous insufficiency, and Vitamin D deficiency.   Surgical History:   Past Surgical History:  Procedure Laterality Date   ABDOMINAL HYSTERECTOMY  CORONARY ARTERY BYPASS GRAFT     3 vessel- Dr. Cyndia Bent 8/96     Social History:   reports that she has never smoked. She has never used smokeless tobacco. She reports that she does not drink alcohol and does not use drugs.   Family History:  Her family history includes Heart disease in her brother and mother.   Allergies Allergies  Allergen Reactions   Metformin Other (See Comments)    REACTION: pt refuses METFORMIN "it made me sad"...    The patient is critically ill with multiple organ systems failure and requires high complexity decision making for assessment and support, frequent evaluation and titration of therapies, application of advanced monitoring technologies and extensive interpretation of multiple databases. Critical Care Time devoted to patient care services described in this note independent of APP/resident time (if applicable)  is 35 minutes.   Sherrilyn Rist MD [facility] Personal pager: See Amion If unanswered, please page CCM On-call: [phone]

## 2022-08-17 ENCOUNTER — Encounter (HOSPITAL_COMMUNITY): Payer: Self-pay | Admitting: Neurological Surgery

## 2022-08-17 LAB — CBC
HCT: 32.1 % — ABNORMAL LOW (ref 36.0–46.0)
Hemoglobin: 9.9 g/dL — ABNORMAL LOW (ref 12.0–15.0)
MCH: 28.4 pg (ref 26.0–34.0)
MCHC: 30.8 g/dL (ref 30.0–36.0)
MCV: 92.2 fL (ref 80.0–100.0)
Platelets: 116 10*3/uL — ABNORMAL LOW (ref 150–400)
RBC: 3.48 MIL/uL — ABNORMAL LOW (ref 3.87–5.11)
RDW: 15.5 % (ref 11.5–15.5)
WBC: 6.3 10*3/uL (ref 4.0–10.5)
nRBC: 0 % (ref 0.0–0.2)

## 2022-08-17 LAB — MAGNESIUM: Magnesium: 2.2 mg/dL (ref 1.7–2.4)

## 2022-08-17 LAB — BASIC METABOLIC PANEL
Anion gap: 8 (ref 5–15)
BUN: 31 mg/dL — ABNORMAL HIGH (ref 8–23)
CO2: 23 mmol/L (ref 22–32)
Calcium: 10 mg/dL (ref 8.9–10.3)
Chloride: 111 mmol/L (ref 98–111)
Creatinine, Ser: 0.96 mg/dL (ref 0.44–1.00)
GFR, Estimated: 58 mL/min — ABNORMAL LOW (ref >60)
Glucose, Bld: 107 mg/dL — ABNORMAL HIGH (ref 70–99)
Potassium: 4.3 mmol/L (ref 3.5–5.1)
Sodium: 142 mmol/L (ref 135–145)

## 2022-08-17 LAB — LACTIC ACID, PLASMA: Lactic Acid, Venous: 1.7 mmol/L (ref 0.5–1.9)

## 2022-08-17 LAB — GLUCOSE, CAPILLARY
Glucose-Capillary: 104 mg/dL — ABNORMAL HIGH (ref 70–99)
Glucose-Capillary: 92 mg/dL (ref 70–99)
Glucose-Capillary: 96 mg/dL (ref 70–99)
Glucose-Capillary: 92 mg/dL (ref 70–99)
Glucose-Capillary: 92 mg/dL (ref 70–99)
Glucose-Capillary: 86 mg/dL (ref 70–99)

## 2022-08-17 LAB — PHOSPHORUS: Phosphorus: 2.5 mg/dL (ref 2.5–4.6)

## 2022-08-17 MED ORDER — METOPROLOL TARTRATE 5 MG/5ML IV SOLN
5.0000 mg | Freq: Once | INTRAVENOUS | Status: AC
  Administered 2022-08-17: 5 mg via INTRAVENOUS
  Filled 2022-08-17: qty 5

## 2022-08-17 MED ORDER — GABAPENTIN 100 MG PO CAPS
100.0000 mg | ORAL_CAPSULE | Freq: Every day | ORAL | Status: DC
  Administered 2022-08-17 – 2022-08-26 (×10): 100 mg via ORAL
  Filled 2022-08-17 (×10): qty 1

## 2022-08-17 MED ORDER — LIP MEDEX EX OINT: TOPICAL_OINTMENT | CUTANEOUS | Status: DC | PRN

## 2022-08-17 MED ORDER — NYSTATIN 100000 UNIT/GM EX CREA
TOPICAL_CREAM | Freq: Two times a day (BID) | CUTANEOUS | Status: AC
  Filled 2022-08-17: qty 30

## 2022-08-17 MED ORDER — SENNA 8.6 MG PO TABS
1.0000 | ORAL_TABLET | Freq: Two times a day (BID) | ORAL | Status: DC
  Administered 2022-08-18 – 2022-08-27 (×18): 8.6 mg via ORAL
  Filled 2022-08-17 (×19): qty 1

## 2022-08-17 MED ORDER — METOPROLOL TARTRATE 25 MG PO TABS
50.0000 mg | ORAL_TABLET | Freq: Two times a day (BID) | ORAL | Status: DC
  Administered 2022-08-17: 50 mg via ORAL
  Filled 2022-08-17: qty 2

## 2022-08-17 MED ORDER — VITAMIN D 25 MCG (1000 UNIT) PO TABS
2000.0000 [IU] | ORAL_TABLET | Freq: Every day | ORAL | Status: DC
  Administered 2022-08-18 – 2022-08-27 (×10): 2000 [IU] via ORAL
  Filled 2022-08-17 (×10): qty 2

## 2022-08-17 MED ORDER — DOCUSATE SODIUM 100 MG PO CAPS
100.0000 mg | ORAL_CAPSULE | Freq: Two times a day (BID) | ORAL | Status: DC
  Administered 2022-08-17 – 2022-08-27 (×19): 100 mg via ORAL
  Filled 2022-08-17 (×21): qty 1

## 2022-08-17 MED ORDER — FOLIC ACID 1 MG PO TABS
1.0000 mg | ORAL_TABLET | Freq: Every day | ORAL | Status: DC
  Administered 2022-08-18 – 2022-08-27 (×10): 1 mg via ORAL
  Filled 2022-08-17 (×11): qty 1

## 2022-08-17 MED ORDER — GUAIFENESIN 100 MG/5ML PO LIQD
5.0000 mL | Freq: Four times a day (QID) | ORAL | Status: DC
  Administered 2022-08-17 – 2022-08-27 (×36): 5 mL via ORAL
  Filled 2022-08-17 (×38): qty 5

## 2022-08-17 MED ORDER — ROSUVASTATIN CALCIUM 20 MG PO TABS
40.0000 mg | ORAL_TABLET | Freq: Every day | ORAL | Status: DC
  Administered 2022-08-18 – 2022-08-26 (×9): 40 mg via ORAL
  Filled 2022-08-17 (×9): qty 2

## 2022-08-17 NOTE — Progress Notes (Signed)
NAME:  Erika Gates, MRN:  [id number], DOB:  January 24, 1935, LOS: 4 ADMISSION DATE:  08/10/2022, CONSULTATION DATE: 08/16/2022 REFERRING MD: [place] Pink, CHIEF COMPLAINT: Postarrest  History of Present Illness:  Patient is supposed anterior cervical decompression and arthrodesis with structural space and allograft, anterior plate fixation [id number] on 08/14/2022. Admitted with generalized weakness, pain in shoulders and knee CT scan showed severe multilevel degenerative disease for which she had surgery on 12/22. Was doing well on the medical floor, early this morning noted to have alteration in her mental status, grandson at bedside activated nursing service, patient deteriorated and went into PEA arrest Had 7 minutes of resuscitation before achieving ROSC Transferred to ICU for further management Currently on vent, not on pressors, not on any sedation at present  Pertinent  Medical History   Past Medical History:  Diagnosis Date   Allergic rhinitis    Anemia    Anxiety    Coronary atherosclerosis of unspecified type of vessel, native or graft    Diabetes mellitus    DJD (degenerative joint disease)    Hypercholesteremia    Hypertension    Low back pain    Morbid obesity ([place])    Venous insufficiency    Vitamin D deficiency    Significant Hospital Events: Including procedures, antibiotic start and stop dates in addition to other pertinent events   12/22 anterior cervical decompression and arthrodesis 12/24 PEA arrest, achieved ROSC after 7 minutes  Interim History / Subjective:  Patient is responsive, denies any pain or discomfort Appears to be weaning well  Objective   Blood pressure 134/74, pulse 82, temperature 98.8 F (37.1 C), temperature source Oral, resp. rate 11, height 5\' 1"  (1.549 m), weight 103 kg, SpO2 100 %.    Vent Mode: PRVC FiO2 (%):  [40 %] 40 % Set Rate:  [16 bmp] 16 bmp Vt Set:  [380 mL] 380 mL PEEP:  [5 cmH20] 5 cmH20 Plateau Pressure:  [15 cmH20-18  cmH20] 15 cmH20   Intake/Output Summary (Last 24 hours) at 08/17/2022 [id number] Last data filed at 08/17/2022 0500 Gross per 24 hour  Intake 3843.74 ml  Output 1650 ml  Net 2193.74 ml   Filed Weights   08/14/22 1215  Weight: 103 kg    Examination: General: Elderly lady, does not appear to be in distress HENT: Tracheal tube in place moist oral mucosa Lungs: Occasional rhonchi Cardiovascular: S1-S2 appreciated Abdomen:, Bowel sounds appreciated Extremities: No clubbing, no edema Neuro: Off sedation GU:   Resolved Hospital Problem list     Assessment & Plan:  Post PEA arrest, lasted about 6 minutes -She appears to be weaning well at present -She is fully responsive -Denies having any significant chest pain or chest discomfort  S/p cervical spine decompression for cervical spine stenosis, bilateral upper extremity weakness -Surgical intervention on 08/14/2022 -She was able to stand and had plans for physical therapy prior to PEA event  History of coronary artery disease s/p CABG/hyperlipidemia -On metoprolol and statin -Will continue, on metoprolol per tube  History of hypertension -Will continue to monitor -Initiate antihypertensives once stable -Was on amlodipine, metoprolol, lisinopril  Prerenal azotemia -ACE inhibitor on hold -Monitor renal parameters  Thrombocytopenia -Has been stable  Goal be to wean and possible extubation today  Best Practice (right click and "Reselect all SmartList Selections" daily)   Diet/type: NPO DVT prophylaxis: SCD GI prophylaxis: H2B Lines: N/A Foley:  N/A Code Status:  full code Last date of multidisciplinary goals of care discussion [discussed with daughter  at bedside 08/16/2022]  Labs   CBC: Recent Labs  Lab 08/10/22 1649 08/11/22 0143 08/12/22 0257 08/15/22 0234 08/16/22 0245 08/16/22 1055 08/17/22 0446  WBC 4.9   < > 7.5 9.6 8.4 8.5 6.3  NEUTROABS 2.8  --  5.9 8.2*  --  7.0  --   HGB 11.2*   < > 11.7* 10.3*  9.9* 8.6* 9.9*  HCT 38.7   < > 37.0 32.7* 32.8* 28.5* 32.1*  MCV 95.8   < > 89.8 92.1 93.7 94.1 92.2  PLT 119*   < > 105* 120* 117* PLATELET CLUMPS NOTED ON SMEAR, COUNT APPEARS DECREASED 116*   < > = values in this interval not displayed.    Basic Metabolic Panel: Recent Labs  Lab 08/12/22 0257 08/15/22 0234 08/16/22 0245 08/16/22 0755 08/17/22 0207  NA 140 135 136 133* 142  K 4.1 5.3* 4.8 4.6 4.3  CL 107 103 106 108 111  CO2 26 26 26  13* 23  GLUCOSE 121* 178* 114* 111* 107*  BUN 16 30* 38* 28* 31*  CREATININE 0.72 1.08* 1.25* 0.87 0.96  CALCIUM 10.1 9.4 9.4 8.1* 10.0  MG 2.1 2.3  --  1.4* 2.2  PHOS  --   --   --   --  2.5   GFR: Estimated Creatinine Clearance: 47.3 mL/min (by C-G formula based on SCr of 0.96 mg/dL). Recent Labs  Lab 08/10/22 1649 08/11/22 0143 08/15/22 0234 08/16/22 0245 08/16/22 0755 08/16/22 1055 08/17/22 0207 08/17/22 0446  WBC 4.9   < > 9.6 8.4  --  8.5  --  6.3  LATICACIDVEN 0.9  --   --   --  >9.0*  --  1.7  --    < > = values in this interval not displayed.    Liver Function Tests: Recent Labs  Lab 08/10/22 1649 08/16/22 0755  AST 30 201*  ALT 24 100*  ALKPHOS 81 34*  BILITOT 0.5 0.3  PROT 6.6 3.2*  ALBUMIN 3.2* <1.5*   No results for input(s): "LIPASE", "AMYLASE" in the last 168 hours. No results for input(s): "AMMONIA" in the last 168 hours.  ABG    Component Value Date/Time   PHART 7.26 (L) 08/16/2022 1027   PCO2ART 31 (L) 08/16/2022 1027   PO2ART 42 (L) 08/16/2022 1027   HCO3 13.9 (L) 08/16/2022 1027   TCO2 27 07/14/2021 1518   ACIDBASEDEF 11.9 (H) 08/16/2022 1027   O2SAT 71.9 08/16/2022 1027     Coagulation Profile: No results for input(s): "INR", "PROTIME" in the last 168 hours.  Cardiac Enzymes: Recent Labs  Lab 08/10/22 1649  CKTOTAL 88    HbA1C: Hgb A1c MFr Bld  Date/Time Value Ref Range Status  08/12/2022 02:53 AM 6.2 (H) 4.8 - 5.6 % Final    Comment:    (NOTE)         Prediabetes: 5.7 - 6.4          Diabetes: >6.4         Glycemic control for adults with diabetes: <7.0   07/14/2021 08:24 AM 5.6 4.8 - 5.6 % Final    Comment:    (NOTE) Pre diabetes:          5.7%-6.4%  Diabetes:              >6.4%  Glycemic control for   <7.0% adults with diabetes     CBG: Recent Labs  Lab 08/16/22 1533 08/16/22 1917 08/16/22 2337 08/17/22 0326 08/17/22 0824  GLUCAP 127*  105* 112* 104* 92    Review of Systems:   Denies pain or discomfort  Past Medical History:  She,  has a past medical history of Allergic rhinitis, Anemia, Anxiety, Coronary atherosclerosis of unspecified type of vessel, native or graft, Diabetes mellitus, DJD (degenerative joint disease), Hypercholesteremia, Hypertension, Low back pain, Morbid obesity (HCC), Venous insufficiency, and Vitamin D deficiency.   Surgical History:   Past Surgical History:  Procedure Laterality Date   ABDOMINAL HYSTERECTOMY     CORONARY ARTERY BYPASS GRAFT     3 vessel- Dr. Laneta Simmers 8/96     Social History:   reports that she has never smoked. She has never used smokeless tobacco. She reports that she does not drink alcohol and does not use drugs.   Family History:  Her family history includes Heart disease in her brother and mother.   Allergies Allergies  Allergen Reactions   Metformin Other (See Comments)    REACTION: pt refuses METFORMIN "it made me sad"...     The patient is critically ill with multiple organ systems failure and requires high complexity decision making for assessment and support, frequent evaluation and titration of therapies, application of advanced monitoring technologies and extensive interpretation of multiple databases. Critical Care Time devoted to patient care services described in this note independent of APP/resident time (if applicable)  is 32 minutes.   Virl Diamond MD [facility] Personal pager: See Amion If unanswered, please page CCM On-call: [phone]

## 2022-08-17 NOTE — Progress Notes (Signed)
Patient has some problem areas with skin, including minor abrasions, redness, ecchymosis, and DTI on buttocks. Requested barrier cream by Elink RN, prevalon boots also ordered.

## 2022-08-17 NOTE — Progress Notes (Signed)
Patient has a midnight lactic ordered. Unable to reach phlebotomy to draw lab since midnight. Received an answer at 0138 and was told she is currently in a code. Unable to come at this time. Will try again later.

## 2022-08-17 NOTE — Progress Notes (Signed)
Extubated and doing well this morning  Discussed with family at bedside

## 2022-08-17 NOTE — Progress Notes (Signed)
Patient ID: Erika Gates, female   DOB: 1934-12-26, 87 y.o.   MRN: [id number] BP (!) 142/71   Pulse (!) 108   Temp 98.2 F (36.8 C) (Oral)   Resp (!) 26   Ht 5\' 1"  (1.549 m)   Wt 103 kg   SpO2 100%   BMI 42.91 kg/m   Alert, moving extremities weakly Perrl, full eom Wound ecchymotic, does not appear infectious Continue current management

## 2022-08-17 NOTE — Procedures (Signed)
Extubation Procedure Note  Patient Details:   Name: Erika Gates DOB: 1935-07-08 MRN: [id number]   Airway Documentation:    Vent end date: 08/17/22 Vent end time: 1045   Evaluation  O2 sats: stable throughout Complications: No apparent complications Patient did tolerate procedure well. Bilateral Breath Sounds: Diminished, Rhonchi   Yes  Patient extubated per order to [place] 4L without issue. No signs of stridor or respiratory distress  Noted. Patient able to speak and cough adequately.  [name] 08/17/2022, 11:52 AM

## 2022-08-17 NOTE — Progress Notes (Signed)
eLink Physician-Brief Progress Note Patient Name: Erika Gates DOB: 04-30-1935 MRN: [id number]   Date of Service  08/17/2022  HPI/Events of Note  She has some breakdown on her bottom. Her daughter is a wound care nurse, she wants to know if we can get some antifungal cream  eICU Interventions  Nystatin ordered     Intervention Category Minor Interventions: Routine modifications to care plan (e.g. PRN medications for pain, fever);Other:  Ranee Gosselin 08/17/2022, 1:53 AM

## 2022-08-18 ENCOUNTER — Ambulatory Visit: Payer: Medicare PPO | Attending: Nurse Practitioner | Admitting: Nurse Practitioner

## 2022-08-18 LAB — CBC WITH DIFFERENTIAL/PLATELET
Abs Immature Granulocytes: 0.04 10*3/uL (ref 0.00–0.07)
Basophils Absolute: 0 10*3/uL (ref 0.0–0.1)
Basophils Relative: 0 %
Eosinophils Absolute: 0.1 10*3/uL (ref 0.0–0.5)
Eosinophils Relative: 1 %
HCT: 30.5 % — ABNORMAL LOW (ref 36.0–46.0)
Hemoglobin: 9.2 g/dL — ABNORMAL LOW (ref 12.0–15.0)
Immature Granulocytes: 1 %
Lymphocytes Relative: 7 %
Lymphs Abs: 0.5 10*3/uL — ABNORMAL LOW (ref 0.7–4.0)
MCH: 27.6 pg (ref 26.0–34.0)
MCHC: 30.2 g/dL (ref 30.0–36.0)
MCV: 91.6 fL (ref 80.0–100.0)
Monocytes Absolute: 0.7 10*3/uL (ref 0.1–1.0)
Monocytes Relative: 10 %
Neutro Abs: 5.5 10*3/uL (ref 1.7–7.7)
Neutrophils Relative %: 81 %
Platelets: 130 10*3/uL — ABNORMAL LOW (ref 150–400)
RBC: 3.33 MIL/uL — ABNORMAL LOW (ref 3.87–5.11)
RDW: 15.2 % (ref 11.5–15.5)
WBC: 6.7 10*3/uL (ref 4.0–10.5)
nRBC: 0 % (ref 0.0–0.2)

## 2022-08-18 LAB — COMPREHENSIVE METABOLIC PANEL
ALT: 69 U/L — ABNORMAL HIGH (ref 0–44)
AST: 76 U/L — ABNORMAL HIGH (ref 15–41)
Albumin: 2.1 g/dL — ABNORMAL LOW (ref 3.5–5.0)
Alkaline Phosphatase: 62 U/L (ref 38–126)
Anion gap: 8 (ref 5–15)
BUN: 14 mg/dL (ref 8–23)
CO2: 22 mmol/L (ref 22–32)
Calcium: 8.9 mg/dL (ref 8.9–10.3)
Chloride: 114 mmol/L — ABNORMAL HIGH (ref 98–111)
Creatinine, Ser: 0.55 mg/dL (ref 0.44–1.00)
GFR, Estimated: >60 mL/min (ref >60)
Glucose, Bld: 77 mg/dL (ref 70–99)
Potassium: 3.1 mmol/L — ABNORMAL LOW (ref 3.5–5.1)
Sodium: 144 mmol/L (ref 135–145)
Total Bilirubin: 0.6 mg/dL (ref 0.3–1.2)
Total Protein: 5.3 g/dL — ABNORMAL LOW (ref 6.5–8.1)

## 2022-08-18 LAB — PHOSPHORUS: Phosphorus: 1.7 mg/dL — ABNORMAL LOW (ref 2.5–4.6)

## 2022-08-18 LAB — GLUCOSE, CAPILLARY
Glucose-Capillary: 162 mg/dL — ABNORMAL HIGH (ref 70–99)
Glucose-Capillary: 80 mg/dL (ref 70–99)
Glucose-Capillary: 71 mg/dL (ref 70–99)
Glucose-Capillary: 124 mg/dL — ABNORMAL HIGH (ref 70–99)
Glucose-Capillary: 121 mg/dL — ABNORMAL HIGH (ref 70–99)
Glucose-Capillary: 176 mg/dL — ABNORMAL HIGH (ref 70–99)

## 2022-08-18 LAB — MAGNESIUM: Magnesium: 1.6 mg/dL — ABNORMAL LOW (ref 1.7–2.4)

## 2022-08-18 MED ORDER — INSULIN ASPART 100 UNIT/ML IJ SOLN
0.0000 [IU] | Freq: Three times a day (TID) | INTRAMUSCULAR | Status: DC
  Administered 2022-08-18 – 2022-08-20 (×6): 2 [IU] via SUBCUTANEOUS
  Administered 2022-08-20: 3 [IU] via SUBCUTANEOUS
  Administered 2022-08-21 – 2022-08-26 (×5): 2 [IU] via SUBCUTANEOUS
  Administered 2022-08-26: 3 [IU] via SUBCUTANEOUS
  Administered 2022-08-27: 2 [IU] via SUBCUTANEOUS

## 2022-08-18 MED ORDER — MAGNESIUM SULFATE 2 GM/50ML IV SOLN
2.0000 g | Freq: Once | INTRAVENOUS | Status: AC
  Administered 2022-08-18: 2 g via INTRAVENOUS
  Filled 2022-08-18: qty 50

## 2022-08-18 MED ORDER — CLONAZEPAM 0.5 MG PO TABS: 0.5000 mg | ORAL_TABLET | Freq: Two times a day (BID) | ORAL | Status: DC | PRN

## 2022-08-18 MED ORDER — ONDANSETRON HCL 4 MG PO TABS: 4.0000 mg | ORAL_TABLET | Freq: Four times a day (QID) | ORAL | Status: DC | PRN

## 2022-08-18 MED ORDER — POTASSIUM CHLORIDE CRYS ER 20 MEQ PO TBCR
40.0000 meq | EXTENDED_RELEASE_TABLET | Freq: Once | ORAL | Status: AC
  Administered 2022-08-18: 40 meq via ORAL
  Filled 2022-08-18: qty 2

## 2022-08-18 MED ORDER — POTASSIUM CHLORIDE 10 MEQ/100ML IV SOLN
10.0000 meq | INTRAVENOUS | Status: AC
  Administered 2022-08-18 (×2): 10 meq via INTRAVENOUS
  Filled 2022-08-18 (×2): qty 100

## 2022-08-18 MED ORDER — MAGNESIUM SULFATE 4 GM/100ML IV SOLN
4.0000 g | Freq: Once | INTRAVENOUS | Status: AC
  Administered 2022-08-18: 4 g via INTRAVENOUS
  Filled 2022-08-18: qty 100

## 2022-08-18 MED ORDER — ACETAMINOPHEN 325 MG PO TABS
650.0000 mg | ORAL_TABLET | ORAL | Status: DC | PRN
  Administered 2022-08-21 – 2022-08-26 (×7): 650 mg via ORAL
  Filled 2022-08-18 (×7): qty 2

## 2022-08-18 MED ORDER — ACETAMINOPHEN 650 MG RE SUPP: 650.0000 mg | RECTAL | Status: DC | PRN

## 2022-08-18 MED ORDER — METOPROLOL TARTRATE 5 MG/5ML IV SOLN
2.5000 mg | INTRAVENOUS | Status: DC | PRN
  Administered 2022-08-18 – 2022-08-24 (×2): 2.5 mg via INTRAVENOUS
  Filled 2022-08-18 (×3): qty 5

## 2022-08-18 MED ORDER — ONDANSETRON HCL 4 MG/2ML IJ SOLN: 4.0000 mg | Freq: Four times a day (QID) | INTRAMUSCULAR | Status: DC | PRN

## 2022-08-18 MED ORDER — METOPROLOL SUCCINATE ER 50 MG PO TB24: 50.0000 mg | ORAL_TABLET | Freq: Every day | ORAL | Status: DC

## 2022-08-18 MED ORDER — ASPIRIN 81 MG PO TBEC
81.0000 mg | DELAYED_RELEASE_TABLET | Freq: Every day | ORAL | Status: DC
  Administered 2022-08-18 – 2022-08-27 (×10): 81 mg via ORAL
  Filled 2022-08-18 (×10): qty 1

## 2022-08-18 MED ORDER — POTASSIUM CHLORIDE CRYS ER 20 MEQ PO TBCR: 20.0000 meq | EXTENDED_RELEASE_TABLET | Freq: Once | ORAL | Status: DC

## 2022-08-18 MED ORDER — METOPROLOL SUCCINATE ER 100 MG PO TB24
100.0000 mg | ORAL_TABLET | Freq: Every day | ORAL | Status: DC
  Administered 2022-08-18 – 2022-08-21 (×4): 100 mg via ORAL
  Filled 2022-08-18 (×3): qty 1
  Filled 2022-08-18: qty 2

## 2022-08-18 MED ORDER — ALPRAZOLAM 0.5 MG PO TABS: 0.2500 mg | ORAL_TABLET | Freq: Three times a day (TID) | ORAL | Status: DC | PRN

## 2022-08-18 MED ORDER — CLONAZEPAM 0.5 MG PO TBDP
0.5000 mg | ORAL_TABLET | Freq: Two times a day (BID) | ORAL | Status: DC | PRN
  Administered 2022-08-24 – 2022-08-26 (×4): 0.5 mg via ORAL
  Filled 2022-08-18 (×4): qty 1

## 2022-08-18 MED ORDER — SODIUM PHOSPHATES 45 MMOLE/15ML IV SOLN
30.0000 mmol | Freq: Once | INTRAVENOUS | Status: AC
  Administered 2022-08-18: 30 mmol via INTRAVENOUS
  Filled 2022-08-18: qty 10

## 2022-08-18 MED FILL — Thrombin For Soln 5000 Unit: CUTANEOUS | Qty: 5000 | Status: AC

## 2022-08-18 NOTE — Progress Notes (Signed)
Pt admited back from ICU after PEA event. Pt A&Ox4. Tele A.Fib hx. BP 129/84 (BP Location: Left Arm)   Pulse (!) 106   Temp 98.5 F (36.9 C) (Oral)   Resp 18   Ht 5\' 1"  (1.549 m)   Wt 95.8 kg   SpO2 99%   BMI 39.91 kg/m  Pt has small darkened area at top of buttocks. Wound care consult put in, pt could use geriatric/bariatric/air mattress due to girth. Pt family request wound care to look into IntraDry for wound care. No c/o pain at this time.  08/18/22 7:41 PM

## 2022-08-18 NOTE — PMR Pre-admission (Shared)
PMR Admission Coordinator Pre-Admission Assessment  Patient: Erika Gates is an 87 y.o., female MRN: [id number] DOB: 1935/06/27 Height: _0  (154.9 cm) Weight: 95.8 kg  Insurance Information HMO: ***    PPO: ***     PCP: ***     IPA: ***     80/20: ***     OTHER: *** PRIMARY: [employer]       Policy#: [id number]       Subscriber: Pt  CM Name: ***      Phone#: ***     Fax#: *** Pre-Cert#: ***      Employer: *** Benefits:  Phone #: ***     Name: *** Irene Shipper. Date: ***     Deduct: ***      Out of Pocket Max: ***      Life Max: *** CIR: ***      SNF: *** Outpatient: ***     Co-Pay: *** Home Health: ***      Co-Pay: *** DME: ***     Co-Pay: *** Providers: *** SECONDARY: ***      Policy#: ***     Phone#: ***  Financial Counselor: ***      Phone#: ***  The "Data Collection Information Summary" for patients in Inpatient Rehabilitation Facilities with attached "Privacy Act [place] Records" was provided and verbally reviewed with: Patient and Family  Emergency Contact Information Contact Information     Name Relation Home Work [place] Daughter [phone]  [phone]   Julio Alm   [phone]   Wright,Denise Daughter      [place] Daughter          Current Medical History  Patient Admitting Diagnosis: Cervical Myelopathy, PEA arrest History of Present Illness: Patient is an 87 y.o. female with PMH: CAD s/p CABG, Anxiety, HTN, hyperlipidemia and morbid obesity. She presented to the [facility]  on 08/13/22 with progressive bilateral upper extremity and lower extremity weakness. MRI of the cervical spine demonstrates high-grade stenosis at C3-4 C4-5 and C5-6; C3-4 and 5 6 with evidence of cord compression. On 12/22 she underwent ACDF C3-6. PEA arrest occured 12/24, ROSC 7 minutes.  Intubated 12/24-12/25.  Pt. Seen by PT/OT who recommend CIR to assist return to PLOF.     Patient's medical record from [facility]  has been reviewed by the rehabilitation admission coordinator and physician.  Past Medical History  Past Medical History:  Diagnosis Date   Allergic rhinitis    Anemia    Anxiety    Coronary atherosclerosis of unspecified type of vessel, native or graft    Diabetes mellitus    DJD (degenerative joint disease)    Hypercholesteremia    Hypertension    Low back pain    Morbid obesity (HCC)    Venous insufficiency    Vitamin D deficiency     Has the patient had major surgery during 100 days prior to admission? Yes  Family History   family history includes Heart disease in her brother and mother.  Current Medications  Current Facility-Administered Medications:    0.9 %  sodium chloride infusion, 250 mL, Intravenous, Continuous, Olalere, Adewale A, MD, Stopped at 08/17/22 0012   acetaminophen (TYLENOL) tablet 650 mg, 650 mg, Oral, Q4H PRN **OR** acetaminophen (TYLENOL) suppository 650 mg, 650 mg, Rectal, Q4H PRN, Candee Furbish, MD   aspirin EC tablet 81 mg, 81 mg, Oral, Daily, Candee Furbish, MD, 81 mg at 08/18/22 0945   bisacodyl (DULCOLAX)  suppository 10 mg, 10 mg, Rectal, Daily PRN, Kristeen Miss, MD   Chlorhexidine Gluconate Cloth 2 % PADS 6 each, 6 each, Topical, Daily, Olalere, Adewale A, MD, 6 each at 08/16/22 0931   cholecalciferol (VITAMIN D3) 25 MCG (1000 UNIT) tablet 2,000 Units, 2,000 Units, Oral, Daily, Kamat, Sunil G, MD, 2,000 Units at 08/18/22 0945   clonazePAM (KLONOPIN) disintegrating tablet 0.5 mg, 0.5 mg, Oral, BID PRN, Reome, Earle J, RPH   docusate sodium (COLACE) capsule 100 mg, 100 mg, Oral, BID, Kamat, Sunil G, MD, 100 mg at 13/24/40 1027   folic acid (FOLVITE) tablet 1 mg, 1 mg, Oral, Daily, Kamat, Sunil G, MD, 1 mg at 08/18/22 0945   gabapentin (NEURONTIN) capsule 100 mg, 100 mg, Oral, QHS, Kamat, Sunil G, MD, 100 mg at 08/17/22 2349   guaiFENesin (ROBITUSSIN) 100 MG/5ML liquid 5 mL, 5 mL, Oral, QID, Olalere, Adewale A, MD, 5 mL at 08/18/22  1319   insulin aspart (novoLOG) injection 0-15 Units, 0-15 Units, Subcutaneous, TID WC, Candee Furbish, MD, 2 Units at 08/18/22 1144   lip balm (CARMEX) ointment, , Topical, PRN, Olalere, Adewale A, MD   menthol-cetylpyridinium (CEPACOL) lozenge 3 mg, 1 lozenge, Oral, PRN **OR** phenol (CHLORASEPTIC) mouth spray 1 spray, 1 spray, Mouth/Throat, PRN, Kristeen Miss, MD   metoprolol succinate (TOPROL-XL) 24 hr tablet 100 mg, 100 mg, Oral, Daily, Candee Furbish, MD, 100 mg at 08/18/22 0805   metoprolol tartrate (LOPRESSOR) injection 2.5 mg, 2.5 mg, Intravenous, Q4H PRN, Jeannene Patella, Sunil G, MD, 2.5 mg at 08/18/22 2536   nystatin cream (MYCOSTATIN), , Topical, BID, Cecilie Lowers T, MD, Given at 08/18/22 0946   ondansetron (ZOFRAN) tablet 4 mg, 4 mg, Oral, Q6H PRN **OR** ondansetron (ZOFRAN) injection 4 mg, 4 mg, Intravenous, Q6H PRN, Candee Furbish, MD   pantoprazole (PROTONIX) injection 40 mg, 40 mg, Intravenous, Q24H, Bonnielee Haff, MD, 40 mg at 08/18/22 0944   polyethylene glycol (MIRALAX / GLYCOLAX) packet 17 g, 17 g, Oral, Daily PRN, Kristeen Miss, MD   rosuvastatin (CRESTOR) tablet 40 mg, 40 mg, Oral, QHS, Kamat, Sunil G, MD   senna (SENOKOT) tablet 8.6 mg, 1 tablet, Oral, BID, Kamat, Sunil G, MD, 8.6 mg at 08/18/22 0945   sodium phosphate (FLEET) 7-19 GM/118ML enema 1 enema, 1 enema, Rectal, Once PRN, Kristeen Miss, MD  Patients Current Diet:  Diet Order             DIET DYS 3 Room service appropriate? Yes with Assist; Fluid consistency: Thin; Fluid restriction: 2000 mL Fluid  Diet effective now                   Precautions / Restrictions Precautions Precautions: Fall, Cervical, Other (comment) Precaution Booklet Issued: No Precaution Comments: watch HR Restrictions Weight Bearing Restrictions: No Other Position/Activity Restrictions: no brace needed   Has the patient had 2 or more falls or a fall with injury in the past year? Yes  Prior Activity Level Household: Pt rarely  went out  Prior Functional Level Self Care: Did the patient need help bathing, dressing, using the toilet or eating? Independent  Indoor Mobility: Did the patient need assistance with walking from room to room (with or without device)? Independent  Stairs: Did the patient need assistance with internal or external stairs (with or without device)? Needed some help  Functional Cognition: Did the patient need help planning regular tasks such as shopping or remembering to take medications? Needed some help  Patient Information Are you of  Hispanic, Latino/a,or Spanish origin?: A. No, not of Hispanic, Latino/a, or Spanish origin What is your race?: B. Black or African American Do you need or want an interpreter to communicate with a doctor or health care staff?: 0. No  Patient's Response To:  Health Literacy and Transportation Is the patient able to respond to health literacy and transportation needs?: Yes Health Literacy - How often do you need to have someone help you when you read instructions, pamphlets, or other written material from your doctor or pharmacy?: Never In the past 12 months, has lack of transportation kept you from medical appointments or from getting medications?: No In the past 12 months, has lack of transportation kept you from meetings, work, or from getting things needed for daily living?: No  Development worker, international aid / [place] Devices/Equipment: Oxygen, Dentures (specify type), Walker (specify type), Bedside commode/3-in-1 Home Equipment: Rollator (4 wheels), Cane - single point, BSC/3in1, Hand held shower head  Prior Device Use: Indicate devices/aids used by the patient prior to current illness, exacerbation or injury? Walker  Current Functional Level Cognition  Overall Cognitive Status: No family/caregiver present to determine baseline cognitive functioning Orientation Level: Oriented X4 Following Commands: Follows one step commands consistently,  Follows one step commands with increased time General Comments: pt with no recall of arrest and intubation, only recalls having surgery (and reports "yesterday"), follow simple commands with increased time, good initation but difficulty sequencing.    Extremity Assessment (includes Sensation/Coordination)  Upper Extremity Assessment: RUE deficits/detail, LUE deficits/detail RUE Deficits / Details: grossly: shoulder 2/5, elbow 3+/5 flexion and extension, wrist 3+/5, gross grasp and release. RUE Coordination: decreased fine motor, decreased gross motor LUE Deficits / Details: grossly: shoulder 1/5, elbow flexion 3/5 and extension 3+/5, wrist flexion/extension 3+/5. Noted increased edema limiting functional use of UE. LUE Sensation: decreased light touch LUE Coordination: decreased fine motor, decreased gross motor  Lower Extremity Assessment: Defer to PT evaluation RLE Deficits / Details: ankle ROM WFL; knee ROM >90; hip ROM >90; strength: ankle DF 5, knee extension 4/5, hip extension 4/5 RLE Sensation:  (denies numbness) LLE Deficits / Details: ankle ROM WFL; knee ROM >90; hip ROM >90; strength: ankle DF 5, knee extension 4/5, hip extension 4/5 LLE Sensation:  (denies numbness)    ADLs  Overall ADL's : Needs assistance/impaired Eating/Feeding: Maximal assistance Eating/Feeding Details (indicate cue type and reason): with proper set up and use of tubing Grooming: Wash/dry face, Maximal assistance, Sitting Grooming Details (indicate cue type and reason): EOB, attempting using L UE but switching to R hand and still requires proximal support to reach face Upper Body Bathing: Maximal assistance Lower Body Bathing: Total assistance Upper Body Dressing : Maximal assistance, Sitting Lower Body Dressing: Total assistance, Sitting/lateral leans, Bed level Toilet Transfer: Maximal assistance, +2 for physical assistance Toilet Transfer Details (indicate cue type and reason): deferred Toileting-  Clothing Manipulation and Hygiene: Total assistance Functional mobility during ADLs: Maximal assistance, +2 for physical assistance, +2 for safety/equipment General ADL Comments: limited to EOB due to HR    Mobility  Overal bed mobility: Needs Assistance Bed Mobility: Rolling, Sidelying to Sit, Sit to Sidelying Rolling: Max assist, +2 for physical assistance Sidelying to sit: Max assist, +2 for physical assistance Supine to sit: HOB elevated, Mod assist Sit to sidelying: Max assist, +2 for physical assistance General bed mobility comments: Good initiation from pt. Assist with all aspects of mobility    Transfers  Overall transfer level: Needs assistance Equipment used:  (stedy) Transfers:  Sit to/from Stand Sit to Stand: From elevated surface, Mod assist, +2 physical assistance General transfer comment: deferred due to increased HR    Ambulation / Gait / Stairs / Wheelchair Mobility  Ambulation/Gait General Gait Details: unable    Posture / Balance Dynamic Sitting Balance Sitting balance - Comments: Pt sat EOB x 10-12 minutes min guard assist Balance Overall balance assessment: Needs assistance Sitting-balance support: No upper extremity supported, Feet supported, Single extremity supported Sitting balance-Leahy Scale: Fair Sitting balance - Comments: Pt sat EOB x 10-12 minutes min guard assist Standing balance support: Bilateral upper extremity supported, During functional activity Standing balance-Leahy Scale: Poor Standing balance comment: torso leans/goes to her right    Special needs/care consideration Continuous Drip IV  ***, Skin ***, and Special service needs ***   Previous Home Environment (from acute therapy documentation) Living Arrangements: Other relatives Available Help at Discharge: Available 24 hours/day, Family Type of Home: House Home Layout: One level Home Access: Stairs to enter Entrance Stairs-Rails: Right, Left, Can reach both Entrance Stairs-Number of  Steps: 4 Bathroom Shower/Tub: Tub/shower unit, Multimedia programmer: Handicapped height Bathroom Accessibility: Yes How Accessible: Accessible via walker Home Care Services: Yes Type of Home Care Services: Home PT, Home OT  Discharge Living Setting Plans for Discharge Living Setting: Patient's home Type of Home at Discharge: House Discharge Home Layout: One level Discharge Home Access: Stairs to enter Entrance Stairs-Rails: Right, Left, Can reach both Entrance Stairs-Number of Steps: 4 Discharge Bathroom Shower/Tub: Tub/shower unit Discharge Bathroom Toilet: Handicapped height Discharge Bathroom Accessibility: Yes How Accessible: Accessible via walker Does the patient have any problems obtaining your medications?: No  Social/Family/Support Systems Patient Roles: Other (Comment) Contact Information: [phone] Anticipated Caregiver: Daughter Stanton Kidney Ability/Limitations of Caregiver: min-mod A Caregiver Availability: 24/7 Discharge Plan Discussed with Primary Caregiver: Yes Is Caregiver In Agreement with Plan?: Yes Does Caregiver/Family have Issues with Lodging/Transportation while Pt is in Rehab?: No  Goals Patient/Family Goal for Rehab: PT/OT Mod A Expected length of stay: 18-21 days Pt/Family Agrees to Admission and willing to participate: Yes Program Orientation Provided & Reviewed with Pt/Caregiver Including Roles  & Responsibilities: No  Decrease burden of Care through IP rehab admission:none Possible need for SNF placement upon discharge: not anticipated  Patient Condition: I have reviewed medical records from [facility] , spoken with CM, and patient and spouse. I met with patient at the bedside for inpatient rehabilitation assessment.  Patient will benefit from ongoing PT, OT, and SLP, can actively participate in 3 hours of therapy a day 5 days of the week, and can make measurable gains during the admission.  Patient will also benefit from the  coordinated team approach during an Inpatient Acute Rehabilitation admission.  The patient will receive intensive therapy as well as Rehabilitation physician, nursing, social worker, and care management interventions.  Due to safety, skin/wound care, disease management, medication administration, pain management, and patient education the patient requires 24 hour a day rehabilitation nursing.  The patient is currently *** with mobility and basic ADLs.  Discharge setting and therapy post discharge at home with home health is anticipated.  Patient has agreed to participate in the Acute Inpatient Rehabilitation Program and will admit {Time; today/tomorrow:10263}.  Preadmission Screen Completed By:  Genella Mech, 08/18/2022 4:18 PM ______________________________________________________________________   Discussed status with Dr. [name] on *** at *** and received approval for admission today.  Admission Coordinator:  Genella Mech, CCC-SLP, time [name] KitchenSudie Grumbling ***   Assessment/Plan: Diagnosis: Does the need  for close, 24 hr/day Medical supervision in concert with the patient's rehab needs make it unreasonable for this patient to be served in a less intensive setting? {yes_no_potentially:3041433} Co-Morbidities requiring supervision/potential complications: *** Due to {due JS:4383779}, does the patient require 24 hr/day rehab nursing? {yes_no_potentially:3041433} Does the patient require coordinated care of a physician, rehab nurse, PT, OT, and SLP to address physical and functional deficits in the context of the above medical diagnosis(es)? {yes_no_potentially:3041433} Addressing deficits in the following areas: {deficits:3041436} Can the patient actively participate in an intensive therapy program of at least 3 hrs of therapy 5 days a week? {yes_no_potentially:3041433} The potential for patient to make measurable gains while on inpatient rehab is {potential:3041437} Anticipated functional outcomes upon  discharge from inpatient rehab: {functional outcomes:304600100} PT, {functional outcomes:304600100} OT, {functional outcomes:304600100} SLP Estimated rehab length of stay to reach the above functional goals is: *** Anticipated discharge destination: {anticipated dc setting:21604} 10. Overall Rehab/Functional Prognosis: {potential:3041437}   MD Signature: ***

## 2022-08-18 NOTE — Progress Notes (Addendum)
Occupational Therapy Re-eval  Patient Details Name: Erika Gates MRN: [id number] DOB: [birth year] Today's Date: 08/18/2022   History of present illness Pt is an 87 year old admitted with progressive bilateral upper extremity and lower extremity weakness. MRI of the cervical spine demonstrates high-grade stenosis at C3-4 C4-5 and C5-6; C3-4 and 5 6 with evidence of cord compression. NSU consulted to assess surgical intervention. 12/22 ACDF C3-6. PEA arrest 12/24, ROSC 7 minutes.  Intubated 12/24025. PMH: CAD s/p CABG, Anxiety, HTN, hyperlipidemia and morbid obesity   OT comments  Patient seen in ICU s/p PEA, now extubated.  Patient with poor recall of recent events, still believing that her surgery was yesterday.  She engaged in bed mobility with max assist +2, grooming at EOB with max assist but overall requires max to total assist +2 for ADLs.  Vitals monitored throughout session, HR elevated into 150s with sitting EOB.  Elevated Ues at end of session and encouraged L UE hand pumps due to increased edema in UE. Goals and dc recommendations remain appropriate.  Will follow acutely.    Recommendations for follow up therapy are one component of a multi-disciplinary discharge planning process, led by the attending physician.  Recommendations may be updated based on patient status, additional functional criteria and insurance authorization.    Follow Up Recommendations  Acute inpatient rehab (3hours/day)     Assistance Recommended at Discharge Frequent or constant Supervision/Assistance  Patient can return home with the following  A lot of help with walking and/or transfers;A lot of help with bathing/dressing/bathroom;Assistance with feeding;Assistance with cooking/housework;Assist for transportation;Help with stairs or ramp for entrance   Equipment Recommendations  Tub/shower bench;Wheelchair (measurements OT);Wheelchair cushion (measurements OT);Hospital bed    Recommendations for Other  Services Rehab consult    Precautions / Restrictions Precautions Precautions: Fall;Cervical Precaution Booklet Issued: No Precaution Comments: watch HR, Interdry ordered after OT eval for pannus folds. Required Braces or Orthoses:  (no brace needed) Restrictions Weight Bearing Restrictions: No       Mobility Bed Mobility Overal bed mobility: Needs Assistance Bed Mobility: Rolling, Sidelying to Sit, Sit to Sidelying Rolling: Max assist, +2 for physical assistance, +2 for safety/equipment Sidelying to sit: Max assist, +2 for physical assistance, +2 for safety/equipment     Sit to sidelying: Max assist, +2 for physical assistance, +2 for safety/equipment General bed mobility comments: pt demonstrates good initation but overall requires max assist +2 to transition to/from EOB    Transfers                   General transfer comment: deferred     Balance Overall balance assessment: Needs assistance Sitting-balance support: No upper extremity supported, Feet supported, Single extremity supported Sitting balance-Leahy Scale: Fair Sitting balance - Comments: preference to R UE support, but able to engage in ADLs sitting with min guard to close supervision and 0 hand support                                   ADL either performed or assessed with clinical judgement   ADL Overall ADL's : Needs assistance/impaired     Grooming: Wash/dry face;Maximal assistance;Sitting Grooming Details (indicate cue type and reason): EOB, attempting using L UE but switching to R hand and still requires proximal support to reach face         Upper Body Dressing : Maximal assistance;Sitting   Lower Body Dressing: Total assistance;Sitting/lateral leans;Bed level  Toilet Transfer Details (indicate cue type and reason): deferred         Functional mobility during ADLs: Maximal assistance;+2 for physical assistance;+2 for safety/equipment General ADL Comments: limited  to EOB due to HR    Extremity/Trunk Assessment Upper Extremity Assessment Upper Extremity Assessment: RUE deficits/detail;LUE deficits/detail RUE Deficits / Details: grossly: shoulder 2/5, elbow 3+/5 flexion and extension, wrist 3+/5, gross grasp and release. RUE Coordination: decreased fine motor;decreased gross motor LUE Deficits / Details: grossly: shoulder 1/5, elbow flexion 3/5 and extension 3+/5, wrist flexion/extension 3+/5. Noted increased edema limiting functional use of UE. LUE Sensation: decreased light touch LUE Coordination: decreased fine motor;decreased gross motor   Lower Extremity Assessment Lower Extremity Assessment: Defer to PT evaluation        Vision       Perception     Praxis      Cognition Arousal/Alertness: Awake/alert Behavior During Therapy: Flat affect Overall Cognitive Status: No family/caregiver present to determine baseline cognitive functioning Area of Impairment: Orientation, Memory, Problem solving, Following commands                 Orientation Level: Disoriented to, Situation   Memory: Decreased short-term memory, Decreased recall of precautions Following Commands: Follows one step commands consistently, Follows one step commands with increased time     Problem Solving: Slow processing, Requires verbal cues, Difficulty sequencing General Comments: pt with no recall of arrest and intubation, only recalls having surgery (and reports "yesterday"), follow simple commands with increased time, good initation but difficulty sequencing.        Exercises Exercises: General Upper Extremity General Exercises - Upper Extremity Shoulder Flexion: AROM, AAROM, Both, 5 reps, Seated Elbow Flexion: AROM, AAROM, Both, 5 reps, Seated Elbow Extension: AROM, AAROM, Both, 5 reps, Seated Digit Composite Flexion: AROM, 10 reps, Both, Seated Composite Extension: AROM, Both, 10 reps, Seated Other Exercises Other Exercises: bilateral shoulder elevation  and retraction x 5 reps sitting    Shoulder Instructions       General Comments HR elevated throughout session, but RN cleared for dangle at EOB.  Pt with HR ranging from 124 to 159.  At rest typically 130s, sitting EOB 150s.    Pertinent Vitals/ Pain       Pain Assessment Pain Assessment: Faces Faces Pain Scale: Hurts a little bit Pain Location: L UE, generalized Pain Descriptors / Indicators: Discomfort, Grimacing Pain Intervention(s): Limited activity within patient's tolerance, Monitored during session, Repositioned  Home Living                                          Prior Functioning/Environment              Frequency  Min 2X/week        Progress Toward Goals  OT Goals(current goals can now be found in the care plan section)  Progress towards OT goals: Progressing toward goals  Acute Rehab OT Goals Patient Stated Goal: get better OT Goal Formulation: With patient Time For Goal Achievement: 08/26/22 Potential to Achieve Goals: Good  Plan Discharge plan remains appropriate;Frequency remains appropriate    Co-evaluation    PT/OT/SLP Co-Evaluation/Treatment: Yes Reason for Co-Treatment: For patient/therapist safety;To address functional/ADL transfers   OT goals addressed during session: ADL's and self-care      AM-PAC OT "6 Clicks" Daily Activity     Outcome Measure   Help from another person  eating meals?: A Lot Help from another person taking care of personal grooming?: A Lot Help from another person toileting, which includes using toliet, bedpan, or urinal?: Total Help from another person bathing (including washing, rinsing, drying)?: A Lot Help from another person to put on and taking off regular upper body clothing?: A Lot Help from another person to put on and taking off regular lower body clothing?: Total 6 Click Score: 10    End of Session    OT Visit Diagnosis: Unsteadiness on feet (R26.81);Other abnormalities of gait  and mobility (R26.89);Muscle weakness (generalized) (M62.81);Pain Pain - Right/Left: Left Pain - part of body: Arm (and generalized)   Activity Tolerance Patient tolerated treatment well   Patient Left in bed;with call bell/phone within reach;with bed alarm set   Nurse Communication Mobility status        Time: [id number] OT Time Calculation (min): 26 min  Charges: OT General Charges $OT Visit: 1 Visit OT Evaluation $OT Re-eval: 1 Re-eval  Jolaine Artist, OT Acute Rehabilitation Services Office [phone]   Delight Stare 08/18/2022, 9:29 AM

## 2022-08-18 NOTE — Progress Notes (Signed)
[facility] ADULT ICU REPLACEMENT PROTOCOL   The patient does apply for the [facility] Adult ICU Electrolyte Replacment Protocol based on the criteria listed below:   1.Exclusion criteria: TCTS, ECMO, Dialysis, and Myasthenia Gravis patients 2. Is GFR >/= 30 ml/min? Yes.    Patient's GFR today is >60 3. Is SCr </= 2? Yes.   Patient's SCr is 0.55 mg/dL 4. Did SCr increase >/= 0.5 in 24 hours? No. 5.Pt's weight >40kg  Yes.   6. Abnormal electrolyte(s): potassium 3.1, mag 1.6, phos 1.7  7. Electrolytes replaced per protocol 8.  Call MD STAT for K+ </= 2.5, Phos </= 1, or Mag </= 1 Physician:  n/a  Melvern Banker 08/18/2022 5:39 AM

## 2022-08-18 NOTE — Progress Notes (Signed)
Physical Therapy Treatment Patient Details Name: Erika Gates MRN: [id number] DOB: 12-23-1934 Today's Date: 08/18/2022   History of Present Illness Pt is an 87 year old admitted with progressive bilateral upper extremity and lower extremity weakness. MRI of the cervical spine demonstrates high-grade stenosis at C3-4 C4-5 and C5-6; C3-4 and 5 6 with evidence of cord compression. 12/22 ACDF C3-6. PEA arrest 12/24, ROSC 7 minutes.  Intubated 12/24-12/25. PMH: CAD s/p CABG, Anxiety, HTN, hyperlipidemia and morbid obesity    PT Comments    Pt is now in ICU following PEA 12/24. She required intubation 12/24-12/25. Currently on 4L. Mobility limited by a fib with RVR. She required max assist rolling, and +2 max assist sidelying <> sit. Pt sat EOB x 10-12 minutes min guard assist. Performed UE/LE exercises. Returned to supine in bed at end of session. Resting HR in 130s. HR sustained in 150s sitting EOB.    Recommendations for follow up therapy are one component of a multi-disciplinary discharge planning process, led by the attending physician.  Recommendations may be updated based on patient status, additional functional criteria and insurance authorization.  Follow Up Recommendations  Acute inpatient rehab (3hours/day)     Assistance Recommended at Discharge Frequent or constant Supervision/Assistance  Patient can return home with the following Two people to help with walking and/or transfers;A lot of help with bathing/dressing/bathroom;Assistance with cooking/housework;Assist for transportation;Help with stairs or ramp for entrance   Equipment Recommendations  Rolling walker (2 wheels)    Recommendations for Other Services       Precautions / Restrictions Precautions Precautions: Fall;Cervical;Other (comment) Precaution Booklet Issued: No Precaution Comments: watch HR Required Braces or Orthoses:  (no brace needed) Restrictions Weight Bearing Restrictions: No Other Position/Activity  Restrictions: no brace needed     Mobility  Bed Mobility Overal bed mobility: Needs Assistance Bed Mobility: Rolling, Sidelying to Sit, Sit to Sidelying Rolling: Max assist, +2 for physical assistance Sidelying to sit: Max assist, +2 for physical assistance     Sit to sidelying: Max assist, +2 for physical assistance General bed mobility comments: Good initiation from pt. Assist with all aspects of mobility    Transfers                   General transfer comment: deferred due to increased HR    Ambulation/Gait                   Stairs             Wheelchair Mobility    Modified Rankin (Stroke Patients Only)       Balance Overall balance assessment: Needs assistance Sitting-balance support: No upper extremity supported, Feet supported, Single extremity supported Sitting balance-Leahy Scale: Fair Sitting balance - Comments: Pt sat EOB x 10-12 minutes min guard assist                                    Cognition Arousal/Alertness: Awake/alert Behavior During Therapy: Flat affect Overall Cognitive Status: No family/caregiver present to determine baseline cognitive functioning Area of Impairment: Orientation, Memory, Problem solving, Following commands                 Orientation Level: Disoriented to, Situation   Memory: Decreased short-term memory, Decreased recall of precautions Following Commands: Follows one step commands consistently, Follows one step commands with increased time     Problem Solving: Slow processing, Requires verbal cues, Difficulty sequencing  Exercises General Exercises - Lower Extremity Ankle Circles/Pumps: AROM, Both, 10 reps, Supine Long Arc Quad: AROM, Right, Left, 5 reps, Seated    General Comments General comments (skin integrity, edema, etc.): Resting HR in 130s. Sustained in 150 sitting EOB. SpO2 stable on 4L.      Pertinent Vitals/Pain Pain Assessment Pain Assessment:  Faces Faces Pain Scale: Hurts a little bit Pain Location: L UE, generalized Pain Descriptors / Indicators: Discomfort, Grimacing Pain Intervention(s): Monitored during session, Repositioned    Home Living                          Prior Function            PT Goals (current goals can now be found in the care plan section) Acute Rehab PT Goals Patient Stated Goal: to walk Progress towards PT goals: Not progressing toward goals - comment (PEA 12/24 with transfer to ICU, now a fib with RVR)    Frequency    Min 3X/week      PT Plan Current plan remains appropriate    Co-evaluation PT/OT/SLP Co-Evaluation/Treatment: Yes Reason for Co-Treatment: For patient/therapist safety;To address functional/ADL transfers PT goals addressed during session: Mobility/safety with mobility;Balance OT goals addressed during session: ADL's and self-care      AM-PAC PT "6 Clicks" Mobility   Outcome Measure  Help needed turning from your back to your side while in a flat bed without using bedrails?: A Lot Help needed moving from lying on your back to sitting on the side of a flat bed without using bedrails?: Total Help needed moving to and from a bed to a chair (including a wheelchair)?: Total Help needed standing up from a chair using your arms (e.g., wheelchair or bedside chair)?: Total Help needed to walk in hospital room?: Total Help needed climbing 3-5 steps with a railing? : Total 6 Click Score: 7    End of Session   Activity Tolerance: Patient tolerated treatment well Patient left: in bed;with call bell/phone within reach Nurse Communication: Mobility status PT Visit Diagnosis: Unsteadiness on feet (R26.81);Pain;Muscle weakness (generalized) (M62.81);Other symptoms and signs involving the nervous system (A76.811)     Time: 5726-2035 PT Time Calculation (min) (ACUTE ONLY): 26 min  Charges:  $Therapeutic Activity: 8-22 mins                     Aida Raider, PT   Office # [phone] Pager [phone]    Ilda Foil 08/18/2022, 10:21 AM

## 2022-08-18 NOTE — Progress Notes (Signed)
   NAME:  Erika Gates, MRN:  [id number], DOB:  04/06/1935, LOS: 5 ADMISSION DATE:  08/10/2022, CONSULTATION DATE: 08/16/2022 REFERRING MD: Rito Ehrlich, CHIEF COMPLAINT: Postarrest  History of Present Illness:  Patient is supposed anterior cervical decompression and arthrodesis with structural space and allograft, anterior plate fixation F0-Y7 on 08/14/2022. Admitted with generalized weakness, pain in shoulders and knee CT scan showed severe multilevel degenerative disease for which she had surgery on 12/22. Was doing well on the medical floor, early this morning noted to have alteration in her mental status, grandson at bedside activated nursing service, patient deteriorated and went into PEA arrest Had 7 minutes of resuscitation before achieving ROSC Transferred to ICU for further management Currently on vent, not on pressors, not on any sedation at present  Pertinent  Medical History   Past Medical History:  Diagnosis Date   Allergic rhinitis    Anemia    Anxiety    Coronary atherosclerosis of unspecified type of vessel, native or graft    Diabetes mellitus    DJD (degenerative joint disease)    Hypercholesteremia    Hypertension    Low back pain    Morbid obesity (HCC)    Venous insufficiency    Vitamin D deficiency    Significant Hospital Events: Including procedures, antibiotic start and stop dates in addition to other pertinent events   12/22 anterior cervical decompression and arthrodesis 12/24 PEA arrest, achieved ROSC after 7 minutes  Interim History / Subjective:  No events. Eating breakfast. In afib/RVR  Objective   Blood pressure 134/87, pulse (!) 144, temperature 99.8 F (37.7 C), temperature source Oral, resp. rate 20, height 5\' 1"  (1.549 m), weight 95.8 kg, SpO2 100 %.    Vent Mode: PSV;CPAP FiO2 (%):  [40 %] 40 % PEEP:  [5 cmH20] 5 cmH20 Pressure Support:  [10 cmH20] 10 cmH20   Intake/Output Summary (Last 24 hours) at 08/18/2022 0754 Last data filed  at 08/18/2022 0600 Gross per 24 hour  Intake 1386.55 ml  Output 2840 ml  Net -1453.45 ml    Filed Weights   08/14/22 1215 08/18/22 0200  Weight: 103 kg 95.8 kg    Examination: No distress eating eggs MMM, trachea midline Moves ext to command but weak Wound site dressed without strikethrough Answering questions appropriately Lungs clear Hard of hearing  K/Phos/Mg being repleted CBC stable Resolved Hospital Problem list     Assessment & Plan:  IHCA- suspicion related to polypharmacy induced respiratory depression, no obvious neurological sequelae at present S/P Anterior cervical decompression and arthrodesis with structural spacer and allograft anterior plate fixation 08/20/22  Afib/RVR- new issue, in context of electrolyte derangements; CHADS2vasc score 6 DM2 with hypoglycemia HTN DJD CAD Chronic anemia Mild thrombocytopenia Hypomagnesemia, hypokalemia, hypophosphatermia Chronic pain/anxiety  - PT/OT, up to chair - Replete K/Mg/Phos - FS to ACHS, SSI, hold farxiga for now - Restart home aspirin - Switch xanax to clonazepam PRN - Home gabapentin reduced dose - Hold robaxin, flexeril - Careful with opiates - Restart home metoprolol XL - If persistent Afib need to touch base with NSGY on AC, presumably should be fine this far out for it  Stable for transfer to progressive, appreciate TRH taking over starting 08/19/22  08/21/22 MD PCCM

## 2022-08-18 NOTE — Progress Notes (Addendum)
Inpatient Rehab Admissions Coordinator:    Pt. Is currently not at a level to tolerate the intensity of CIR. I think she's likely to need SNF. Discussed with pt. And daughter and they are in agreement for SNF. I will notify TOC. CIR to sign off  Addendum: Pt.'s daughters asked that I come discuss with them. They would like me to follow 1-2 more therapy sessions to see if participation/tolerance improves. I will agree to do that but Recommend Pt. Continue looking at SNF as I think that is most likely rehab venue.   Megan Salon, MS, CCC-SLP Rehab Admissions Coordinator  [phone] (celll) [phone] (office)

## 2022-08-18 NOTE — Progress Notes (Signed)
eLink Physician-Brief Progress Note Patient Name: Erika Gates DOB: Jan 09, 1935 MRN: [id number]   Date of Service  08/18/2022  HPI/Events of Note  Notified of A fib. HR in 140s. SBP 135. No distress at all. Seen on camera. Due for metoprolol later this AM for high BPs  eICU Interventions  Prn IV lopressor ordered K and mag being replaced Further management would depend on how she does after replacement of lytes  D.w RN     Intervention Category Major Interventions: Arrhythmia - evaluation and management  Atalie Oros G Lilyian Quayle 08/18/2022, 6:08 AM

## 2022-08-18 NOTE — Evaluation (Signed)
Clinical/Bedside Swallow Evaluation Patient Details  Name: KEMONIE CUTILLO MRN: [id number] Date of Birth: 18-Apr-1935  Today's Date: 08/18/2022 Time: SLP Start Time (ACUTE ONLY): 1005 SLP Stop Time (ACUTE ONLY): 1025 SLP Time Calculation (min) (ACUTE ONLY): 20 min  Past Medical History:  Past Medical History:  Diagnosis Date   Allergic rhinitis    Anemia    Anxiety    Coronary atherosclerosis of unspecified type of vessel, native or graft    Diabetes mellitus    DJD (degenerative joint disease)    Hypercholesteremia    Hypertension    Low back pain    Morbid obesity (HCC)    Venous insufficiency    Vitamin D deficiency    Past Surgical History:  Past Surgical History:  Procedure Laterality Date   ABDOMINAL HYSTERECTOMY     ANTERIOR CERVICAL DECOMP/DISCECTOMY FUSION N/A 08/14/2022   Procedure: Cerivcal Three-Four, Cerivcal Four-Five, Cerivcal Five-Six Anterior Cervical Discectomy Fusion;  Surgeon: Barnett Abu, MD;  Location: [facility] OR;  Service: Neurosurgery;  Laterality: N/A;  RM 19   CORONARY ARTERY BYPASS GRAFT     3 vessel- Dr. Laneta Simmers 8/96   HPI:  Patient is an 87 y.o. female with PMH: CAD s/p CABG, Anxiety, HTN, hyperlipidemia and morbid obesity. She presented to the hospital on 08/13/22 with progressive bilateral upper extremity and lower extremity weakness. MRI of the cervical spine demonstrates high-grade stenosis at C3-4 C4-5 and C5-6; C3-4 and 5 6 with evidence of cord compression. On 12/22 she underwent ACDF C3-6. PEA arrest occured 12/24, ROSC 7 minutes.  Intubated 12/24-12/25.    Assessment / Plan / Recommendation  Clinical Impression  Patient is presenting with clinical s/s of dysphagia with likely cause being post-ACDF surgery. Patient denies any prior h/o dysphagia. SLP observed her to swallow at least 1-2 extra times with sips of water and she did report that she has been having to do this with solids and liquids. She told SLP that when eating breakfast this  morning "the sausage was a little difficult" to swallow. Swallow initiation appeared timely. Patient did not initially exhibit any coughing or throat clearing but towards end of assessment, she did exhibit one instance of cough response after a sip of liquids. Currently, patient appears to have a post-ACDF surgery related dysphagia which should resolve over time as the likely pharyngeal edema resolves. SLP will follow patient to ensure diet toleration and can proceed with objective swallow study if swallow does not improve as expected. SLP Visit Diagnosis: Dysphagia, unspecified (R13.10);Dysphagia, pharyngeal phase (R13.13)    Aspiration Risk  Mild aspiration risk    Diet Recommendation Dysphagia 3 (Mech soft);Thin liquid   Liquid Administration via: Cup;Straw Medication Administration: Whole meds with liquid Supervision: Patient able to self feed;Intermittent supervision to cue for compensatory strategies Compensations: Slow rate;Small sips/bites Postural Changes: Seated upright at 90 degrees    Other  Recommendations Oral Care Recommendations: Oral care BID    Recommendations for follow up therapy are one component of a multi-disciplinary discharge planning process, led by the attending physician.  Recommendations may be updated based on patient status, additional functional criteria and insurance authorization.  Follow up Recommendations Follow physician's recommendations for discharge plan and follow up therapies      Assistance Recommended at Discharge    Functional Status Assessment Patient has had a recent decline in their functional status and demonstrates the ability to make significant improvements in function in a reasonable and predictable amount of time.  Frequency and Duration min 1 x/week  1 week       Prognosis Prognosis for Safe Diet Advancement: Good      Swallow Study   General Date of Onset: 08/17/22 HPI: Patient is an 87 y.o. female with PMH: CAD s/p CABG,  Anxiety, HTN, hyperlipidemia and morbid obesity. She presented to the hospital on 08/13/22 with progressive bilateral upper extremity and lower extremity weakness. MRI of the cervical spine demonstrates high-grade stenosis at C3-4 C4-5 and C5-6; C3-4 and 5 6 with evidence of cord compression. On 12/22 she underwent ACDF C3-6. PEA arrest occured 12/24, ROSC 7 minutes.  Intubated 12/24-12/25. Type of Study: Bedside Swallow Evaluation Previous Swallow Assessment: none found Diet Prior to this Study: Dysphagia 3 (soft);Thin liquids Temperature Spikes Noted: No Respiratory Status: Nasal cannula History of Recent Intubation: Yes Length of Intubations (days): 2 days Date extubated: 08/17/22 Behavior/Cognition: Alert;Cooperative;Pleasant mood Oral Cavity Assessment: Within Functional Limits Oral Care Completed by SLP: Recent completion by staff Oral Cavity - Dentition: Adequate natural dentition Vision: Functional for self-feeding Self-Feeding Abilities: Able to feed self;Needs set up Patient Positioning: Upright in bed Baseline Vocal Quality: Normal;Low vocal intensity Volitional Cough: Congested;Strong Volitional Swallow: Able to elicit    Oral/Motor/Sensory Function Overall Oral Motor/Sensory Function: Within functional limits   Ice Chips     Thin Liquid Thin Liquid: Impaired Presentation: Straw;Self Fed Pharyngeal  Phase Impairments: Multiple swallows;Other (comments) Other Comments: one instance of cough towards end of evaluation    Nectar Thick     Honey Thick     Puree Puree: Not tested   Solid     Solid: Not tested     Angela Nevin, MA, CCC-SLP Speech Therapy

## 2022-08-19 DIAGNOSIS — R29898 Other symptoms and signs involving the musculoskeletal system: Secondary | ICD-10-CM | POA: Diagnosis not present

## 2022-08-19 DIAGNOSIS — I4891 Unspecified atrial fibrillation: Secondary | ICD-10-CM

## 2022-08-19 LAB — GLUCOSE, CAPILLARY
Glucose-Capillary: 102 mg/dL — ABNORMAL HIGH (ref 70–99)
Glucose-Capillary: 124 mg/dL — ABNORMAL HIGH (ref 70–99)
Glucose-Capillary: 134 mg/dL — ABNORMAL HIGH (ref 70–99)
Glucose-Capillary: 134 mg/dL — ABNORMAL HIGH (ref 70–99)

## 2022-08-19 LAB — MAGNESIUM: Magnesium: 2.5 mg/dL — ABNORMAL HIGH (ref 1.7–2.4)

## 2022-08-19 LAB — BASIC METABOLIC PANEL
Anion gap: 7 (ref 5–15)
BUN: 16 mg/dL (ref 8–23)
CO2: 26 mmol/L (ref 22–32)
Calcium: 9.6 mg/dL (ref 8.9–10.3)
Chloride: 111 mmol/L (ref 98–111)
Creatinine, Ser: 0.82 mg/dL (ref 0.44–1.00)
GFR, Estimated: >60 mL/min (ref >60)
Glucose, Bld: 107 mg/dL — ABNORMAL HIGH (ref 70–99)
Potassium: 4.1 mmol/L (ref 3.5–5.1)
Sodium: 144 mmol/L (ref 135–145)

## 2022-08-19 LAB — PHOSPHORUS: Phosphorus: 2.5 mg/dL (ref 2.5–4.6)

## 2022-08-19 MED ORDER — PANTOPRAZOLE SODIUM 40 MG PO TBEC
40.0000 mg | DELAYED_RELEASE_TABLET | Freq: Every day | ORAL | Status: DC
  Administered 2022-08-20 – 2022-08-27 (×8): 40 mg via ORAL
  Filled 2022-08-19 (×8): qty 1

## 2022-08-19 MED ORDER — POLYVINYL ALCOHOL 1.4 % OP SOLN
1.0000 [drp] | OPHTHALMIC | Status: DC | PRN
  Administered 2022-08-19: 1 [drp] via OPHTHALMIC
  Filled 2022-08-19: qty 15

## 2022-08-19 MED ORDER — SALINE SPRAY 0.65 % NA SOLN
1.0000 | NASAL | Status: DC | PRN
  Filled 2022-08-19: qty 44

## 2022-08-19 NOTE — Consult Note (Addendum)
WOC Nurse Consult Note: Reason for Consult: Sacral DTPI  Wound type: sacral and buttock DTPI  Pressure Injury POA: no  Measurements:  linear areas on bilateral buttocks and gluteal cleft Wound bed: dark purple non-blanchable intact skin  Dressing procedure/placement/frequency: cleanse with spray cleanser, cut small pieces of Xeroform to cover dark purple areas and cover with foam   Currently utilizing antimicrobial wicking fabric under pannus, family requesting for inframammary.    Low air loss mattress ordered for pressure redistribution and moisture management.  Orders updated.   WOC nurse will see patient at least every 10 days and prn.   Thank you,  Olga Bourbeau MSN, RN-BC, [employer]

## 2022-08-19 NOTE — TOC Initial Note (Signed)
Transition of Care [facility]) - Initial/Assessment Note    Patient Details  Name: Erika Gates MRN: [id number] Date of Birth: August 23, 1935  [facility] CM/SW Contact:    Pollie Friar, RN Phone Number: 08/19/2022, 11:20 AM  Clinical Narrative:                 CM met with the patient and her family at the bedside. They are interested in SNF rehab and not CIR. They prefer pt attend a SNF in [place]. They were agreeable to having her faxed out to [facility] and [employer]. CM has sent the referral.  [facility] will need additional information. Cm has asked MD to sign the needed documents and CM will send to [facility].  TOC following.  Expected Discharge Plan: Skilled Nursing Facility Barriers to Discharge: Continued Medical Work up, SNF Pending bed offer   Patient Goals and CMS Choice   CMS Medicare.gov Compare Post Acute Care list provided to:: Patient Represenative (must comment) Choice offered to / list presented to : Adult Children      Expected Discharge Plan and Services In-house Referral: Clinical Social Work Discharge Planning Services: CM Consult Post Acute Care Choice: [place] Living arrangements for the past 2 months: [place]                                      Prior Living Arrangements/Services Living arrangements for the past 2 months: Single Family Home Lives with:: Adult Children Patient language and need for interpreter reviewed:: Yes Do you feel safe going back to the place where you live?: Yes      Need for Family Participation in Patient Care: Yes (Comment) Care giver support system in place?: No (comment)   Criminal Activity/Legal Involvement Pertinent to Current Situation/Hospitalization: No - Comment as needed  Activities of Daily Living Home Assistive Devices/Equipment: Oxygen, Dentures (specify type), Walker (specify type), Bedside commode/3-in-1 ADL Screening (condition at time of admission) Patient's  cognitive ability adequate to safely complete daily activities?: Yes Is the patient deaf or have difficulty hearing?: No Does the patient have difficulty seeing, even when wearing glasses/contacts?: No Does the patient have difficulty concentrating, remembering, or making decisions?: No Patient able to express need for assistance with ADLs?: Yes Does the patient have difficulty dressing or bathing?: No Independently performs ADLs?: Yes (appropriate for developmental age) Does the patient have difficulty walking or climbing stairs?: Yes Weakness of Legs: Both Weakness of Arms/Hands: Both  Permission Sought/Granted                  Emotional Assessment Appearance:: Appears stated age     Orientation: : Oriented to Self, Oriented to Place, Oriented to  Time, Oriented to Situation   Psych Involvement: No (comment)  Admission diagnosis:  Weakness [R53.1] Cervical stenosis of spinal canal [M48.02] Patient Active Problem List   Diagnosis Date Noted   Cervical stenosis of spinal canal 08/13/2022   Weakness 08/10/2022   Upper extremity weakness 08/10/2022   Weakness of left lower extremity 08/10/2022   Anxiety 08/10/2022   Acute respiratory failure with hypoxia and hypercarbia ([place]) 07/14/2021   DDD (degenerative disc disease), lumbar with facet arthropathy and Spinal stenosis 03/12/2017   Primary osteoarthritis of both knees 03/12/2017   History of coronary artery disease s/p CABG  03/12/2017   Gout 12/16/2012   MORBID OBESITY 06/12/2009   VITAMIN D DEFICIENCY  01/06/2009   VENOUS INSUFFICIENCY 01/15/2008   Hyperlipidemia with target LDL less than 70 09/15/2007   ALLERGIC RHINITIS 09/15/2007   HYPERCHOLESTEROLEMIA 08/08/2007   ANXIETY 08/08/2007   Essential hypertension 08/08/2007   Coronary atherosclerosis 08/08/2007   DEGENERATIVE JOINT DISEASE 08/08/2007   LOW BACK PAIN, CHRONIC 08/08/2007   ANEMIA, HX OF 08/08/2007   PCP:  Donald Prose, MD Pharmacy:   [id number] [place], [place] [id number]: [phone]: [phone]    Social Determinants of Health (SDOH) Social History: [place] No Food Insecurity (08/11/2022)  Housing: Low Risk  (08/11/2022)  Transportation Needs: No Transportation Needs (08/11/2022)  Utilities: Not At Risk (08/11/2022)  Tobacco Use: Low Risk  (08/17/2022)   SDOH Interventions:     Readmission Risk Interventions     No data to display

## 2022-08-19 NOTE — [place] FL2 (Signed)
[place] MEDICAID FL2 LEVEL OF CARE FORM     IDENTIFICATION  Patient Name: Erika Gates Birthdate: 03-Mar-1935 Sex: female Admission Date (Current Location): 08/10/2022  [facility] and [place] Number:  Producer, television/film/video and Address:  [facility] [place], [address], [place], [place] [address]      Provider Number: [id number]  Attending Physician Name and Address:  Burnadette Pop, MD  Relative Name and Phone Number:       Current Level of Care: Hospital Recommended Level of Care: Skilled Nursing Facility Prior Approval Number:    Date Approved/Denied:   PASRR Number:    Discharge Plan: SNF    Current Diagnoses: Patient Active Problem List   Diagnosis Date Noted   Cervical stenosis of spinal canal 08/13/2022   Weakness 08/10/2022   Upper extremity weakness 08/10/2022   Weakness of left lower extremity 08/10/2022   Anxiety 08/10/2022   Acute respiratory failure with hypoxia and hypercarbia (HCC) 07/14/2021   DDD (degenerative disc disease), lumbar with facet arthropathy and Spinal stenosis 03/12/2017   Primary osteoarthritis of both knees 03/12/2017   History of coronary artery disease s/p CABG  03/12/2017   Gout 12/16/2012   MORBID OBESITY 06/12/2009   VITAMIN D DEFICIENCY 01/06/2009   VENOUS INSUFFICIENCY 01/15/2008   Hyperlipidemia with target LDL less than 70 09/15/2007   ALLERGIC RHINITIS 09/15/2007   HYPERCHOLESTEROLEMIA 08/08/2007   ANXIETY 08/08/2007   Essential hypertension 08/08/2007   Coronary atherosclerosis 08/08/2007   DEGENERATIVE JOINT DISEASE 08/08/2007   LOW BACK PAIN, CHRONIC 08/08/2007   ANEMIA, HX OF 08/08/2007    Orientation RESPIRATION BLADDER Height & Weight     Self, Time, Situation, Place  O2, Other (Comment) (see d/c summary for oxygen needs/ oral suction) Incontinent Weight: 95.8 kg Height:  5\' 1"  (154.9 cm)  BEHAVIORAL SYMPTOMS/MOOD NEUROLOGICAL BOWEL NUTRITION STATUS      Continent Diet (dysphagia 3  with thin liquids--2000cc FR)  AMBULATORY STATUS COMMUNICATION OF NEEDS Skin   Extensive Assist Verbally Surgical wounds, Skin abrasions, Bruising (incision to neck/ buttock with deep tissue pressure sore/ sacral abrasion/ hand and neck bruises)                       Personal Care Assistance Level of Assistance  Bathing, Feeding, Dressing Bathing Assistance: Maximum assistance Feeding assistance: Maximum assistance Dressing Assistance: Maximum assistance     Functional Limitations Info  Sight, Speech, Hearing Sight Info: Impaired Hearing Info: Adequate Speech Info: Adequate    SPECIAL CARE FACTORS FREQUENCY  PT (By licensed PT), OT (By licensed OT), Speech therapy     PT Frequency: 5x/wk OT Frequency: 5x/wk     Speech Therapy Frequency: 5x/wk      Contractures Contractures Info: Not present    Additional Factors Info  Code Status, Allergies, Psychotropic, Insulin Sliding Scale Code Status Info: Full Allergies Info: Metformin Psychotropic Info: Neurontin 100 mg at bedtime Insulin Sliding Scale Info: Novolog 0-15 units Sq TID       Current Medications (08/19/2022):  This is the current hospital active medication list Current Facility-Administered Medications  Medication Dose Route Frequency Provider Last Rate Last Admin   0.9 %  sodium chloride infusion  250 mL Intravenous Continuous Olalere, Adewale A, MD   Stopped at 08/17/22 0012   acetaminophen (TYLENOL) tablet 650 mg  650 mg Oral Q4H PRN 08/19/22, MD       Or   acetaminophen (TYLENOL) suppository 650 mg  650  mg Rectal Q4H PRN Lorin Glass, MD       aspirin EC tablet 81 mg  81 mg Oral Daily Lorin Glass, MD   81 mg at 08/19/22 1031   bisacodyl (DULCOLAX) suppository 10 mg  10 mg Rectal Daily PRN Barnett Abu, MD       Chlorhexidine Gluconate Cloth 2 % PADS 6 each  6 each Topical Daily Olalere, Adewale A, MD   6 each at 08/19/22 1032   cholecalciferol (VITAMIN D3) 25 MCG (1000 UNIT) tablet 2,000  Units  2,000 Units Oral Daily Oretha Milch, MD   2,000 Units at 08/19/22 1032   clonazePAM (KLONOPIN) disintegrating tablet 0.5 mg  0.5 mg Oral BID PRN Reome, Earle J, RPH       docusate sodium (COLACE) capsule 100 mg  100 mg Oral BID Kamat, Sunil G, MD   100 mg at 08/19/22 1031   folic acid (FOLVITE) tablet 1 mg  1 mg Oral Daily Kamat, Sunil G, MD   1 mg at 08/19/22 1031   gabapentin (NEURONTIN) capsule 100 mg  100 mg Oral QHS Kamat, Sunil G, MD   100 mg at 08/18/22 2248   guaiFENesin (ROBITUSSIN) 100 MG/5ML liquid 5 mL  5 mL Oral QID Olalere, Adewale A, MD   5 mL at 08/19/22 1031   insulin aspart (novoLOG) injection 0-15 Units  0-15 Units Subcutaneous TID WC Lorin Glass, MD   2 Units at 08/18/22 1829   lip balm (CARMEX) ointment   Topical PRN Olalere, Adewale A, MD       menthol-cetylpyridinium (CEPACOL) lozenge 3 mg  1 lozenge Oral PRN Barnett Abu, MD       Or   phenol (CHLORASEPTIC) mouth spray 1 spray  1 spray Mouth/Throat PRN Barnett Abu, MD       metoprolol succinate (TOPROL-XL) 24 hr tablet 100 mg  100 mg Oral Daily Lorin Glass, MD   100 mg at 08/19/22 1031   metoprolol tartrate (LOPRESSOR) injection 2.5 mg  2.5 mg Intravenous Q4H PRN Oretha Milch, MD   2.5 mg at 08/18/22 5027   nystatin cream (MYCOSTATIN)   Topical BID Ranee Gosselin, MD   Given at 08/19/22 1032   ondansetron (ZOFRAN) tablet 4 mg  4 mg Oral Q6H PRN Lorin Glass, MD       Or   ondansetron [facility]) injection 4 mg  4 mg Intravenous Q6H PRN Lorin Glass, MD       pantoprazole (PROTONIX) injection 40 mg  40 mg Intravenous Q24H Osvaldo Shipper, MD   40 mg at 08/19/22 1031   polyethylene glycol (MIRALAX / GLYCOLAX) packet 17 g  17 g Oral Daily PRN Barnett Abu, MD       rosuvastatin (CRESTOR) tablet 40 mg  40 mg Oral QHS Oretha Milch, MD   40 mg at 08/18/22 2248   senna (SENOKOT) tablet 8.6 mg  1 tablet Oral BID Oretha Milch, MD   8.6 mg at 08/19/22 1031   sodium chloride (OCEAN) 0.65 % nasal spray  1 spray  1 spray Each Nare PRN Burnadette Pop, MD       sodium phosphate (FLEET) 7-19 GM/118ML enema 1 enema  1 enema Rectal Once PRN Barnett Abu, MD         Discharge Medications: Please see discharge summary for a list of discharge medications.  Relevant Imaging Results:  Relevant Lab Results:   Additional Information SS#: [id number]  Gloriajean Dell  Yehuda Mao, RN

## 2022-08-19 NOTE — Progress Notes (Signed)
PROGRESS NOTE  Erika Gates  [id number] DOB: [birth year] DOA: 08/10/2022 PCP: Donald Prose, MD   Brief Narrative: Patient is a 87 year old female with past medical history of coronary disease status post CABG, hypertension, hyperlipidemia, obesity who presented with progressive bilateral upper extremities, lower extremity weakness.  She was found to have a spondylolysis with myelopathy, quadriparesis.  Neurosurgery consulted and she underwent anterior cervical decompression, arthrodesis on  with structural spacer and allograft anterior plate fixation [id number] on 12/22.she had sudden onset of altered mental status and went into PEA arrest on 12/24.  Had 7 minutes of resuscitation finally achieving ROSC.  Transferred to ICU for further management and kept on vent.  Patient transferred to [facility] service on 12/27  Assessment & Plan:  Principal Problem:   Upper extremity weakness Active Problems:   MORBID OBESITY   Essential hypertension   History of coronary artery disease s/p CABG    Weakness of left lower extremity   Anxiety   Cervical stenosis of spinal canal   Cardiac arrest: Suspected to be secondary to polypharmacy induced respiratory depression.  Currently alert and oriented.  Extubated. On 2 L of oxygen currently.  On home she is on 1 to 2 L as needed at night.  A-fib with RVR: New problem.CHADS2vasc score 6 .  Checking EKG again today.  Cardiology consulted.  Heart rate ranging from 100 210.  Status post cervical spine decompression: Neurosurgery were following.  History of cervical spinal stenosis, bilateral upper extremity weakness.  Status post decompression surgery on 12/22.  PT/OT recommending acute inpatient rehab  History of coronary artery disease: Status post CABG.  No anginal symptoms.  On metoprolol, statin  History of hypertension: On metoprolol, amlodipine, lisinopril at home.  AKI: Resolved  Diabetes: Continue sliding scale insulin  Thrombocytopenia:  Stable.  Morbid obesity: BMI 39.9    Pressure Injury 08/16/22 Buttocks Medial Deep Tissue Pressure Injury - Purple or maroon localized area of discolored intact skin or blood-filled blister due to damage of underlying soft tissue from pressure and/or shear. (Active)  08/16/22 2000  Location: Buttocks  Location Orientation: Medial  Staging: Deep Tissue Pressure Injury - Purple or maroon localized area of discolored intact skin or blood-filled blister due to damage of underlying soft tissue from pressure and/or shear.  Wound Description (Comments):   Present on Admission:   Dressing Type Foam - Lift dressing to assess site every shift 08/19/22 0752    DVT prophylaxis:SCD's Start: 08/14/22 1315 Place and maintain sequential compression device Start: 08/13/22 1027     Code Status: Full Code  Family Communication: None  at bedside  Patient status:Inpatient  Patient is from :Home  Anticipated discharge to:SNF  Estimated DC date:1-2 days   Consultants: Neurosurgery, PCCM, cardiology  Procedures: Intubation  Antimicrobials:  Anti-infectives (From admission, onward)    Start     Dose/Rate Route Frequency Ordered Stop   08/14/22 1800  ceFAZolin (ANCEF) IVPB 2g/100 mL premix        2 g 200 mL/hr over 30 Minutes Intravenous Every 8 hours 08/14/22 1314 08/15/22 1147   08/14/22 0733  ceFAZolin (ANCEF) 2-4 GM/100ML-% IVPB       Note to Pharmacy: Rennie Natter (: cabinet override      08/14/22 [id number] 08/14/22 1944       Subjective: Patient seen and examined at bedside this afternoon.  Hemodynamically stable.  Lying in bed.  On 2 L of oxygen.  Denied worsening shortness of breath or cough.  She is in  A-fib.  No history of A-fib in the past.  Lying in bed, alert oriented Objective: Vitals:   08/19/22 0404 08/19/22 0738 08/19/22 0751 08/19/22 1211  BP: 125/77  (!) 130/56 122/66  Pulse: (!) 102 100  (!) 107  Resp: 18 14  20   Temp: (!) 97.5 F (36.4 C) 99 F (37.2 C)  98.2  F (36.8 C)  TempSrc: Oral Oral  Oral  SpO2: 99%   98%  Weight:      Height:        Intake/Output Summary (Last 24 hours) at 08/19/2022 1304 Last data filed at 08/19/2022 0900 Gross per 24 hour  Intake 360.98 ml  Output 950 ml  Net -589.02 ml   Filed Weights   08/14/22 1215 08/18/22 0200  Weight: 103 kg 95.8 kg    Examination:  General exam: Overall comfortable, not in distress,obese HEENT: PERRL Respiratory system:  no wheezes or crackles  Cardiovascular system: Irregular regular rhythm.  Gastrointestinal system: Abdomen is nondistended, soft and nontender. Central nervous system: Alert and oriented Extremities: No edema, no clubbing ,no cyanosis Skin: No rashes, no ulcers,no icterus     Data Reviewed: I have personally reviewed following labs and imaging studies  CBC: Recent Labs  Lab 08/15/22 0234 08/16/22 0245 08/16/22 1055 08/17/22 0446 08/18/22 0320  WBC 9.6 8.4 8.5 6.3 6.7  NEUTROABS 8.2*  --  7.0  --  5.5  HGB 10.3* 9.9* 8.6* 9.9* 9.2*  HCT 32.7* 32.8* 28.5* 32.1* 30.5*  MCV 92.1 93.7 94.1 92.2 91.6  PLT 120* 117* PLATELET CLUMPS NOTED ON SMEAR, COUNT APPEARS DECREASED 116* 130*   Basic Metabolic Panel: Recent Labs  Lab 08/15/22 0234 08/16/22 0245 08/16/22 0755 08/17/22 0207 08/18/22 0320 08/19/22 0803  NA 135 136 133* 142 144 144  K 5.3* 4.8 4.6 4.3 3.1* 4.1  CL 103 106 108 111 114* 111  CO2 26 26 13* 23 22 26   GLUCOSE 178* 114* 111* 107* 77 107*  BUN 30* 38* 28* 31* 14 16  CREATININE 1.08* 1.25* 0.87 0.96 0.55 0.82  CALCIUM 9.4 9.4 8.1* 10.0 8.9 9.6  MG 2.3  --  1.4* 2.2 1.6* 2.5*  PHOS  --   --   --  2.5 1.7* 2.5     Recent Results (from the past 240 hour(s))  Resp panel by RT-PCR (RSV, Flu A&B, Covid) Anterior Nasal Swab     Status: None   Collection Time: 08/10/22  6:18 AM   Specimen: Anterior Nasal Swab  Result Value Ref Range Status   SARS Coronavirus 2 by RT PCR NEGATIVE NEGATIVE Final    Comment: (NOTE) SARS-CoV-2 target  nucleic acids are NOT DETECTED.  The SARS-CoV-2 RNA is generally detectable in upper respiratory specimens during the acute phase of infection. The lowest concentration of SARS-CoV-2 viral copies this assay can detect is 138 copies/mL. A negative result does not preclude SARS-Cov-2 infection and should not be used as the sole basis for treatment or other patient management decisions. A negative result may occur with  improper specimen collection/handling, submission of specimen other than nasopharyngeal swab, presence of viral mutation(s) within the areas targeted by this assay, and inadequate number of viral copies(<138 copies/mL). A negative result must be combined with clinical observations, patient history, and epidemiological information. The expected result is Negative.  Fact Sheet for Patients:   Fact Sheet for Healthcare Providers:  08/12/22  This test is no t yet approved or cleared by the [employer] and  has been authorized for detection and/or diagnosis of SARS-CoV-2 by FDA under an Emergency Use Authorization (EUA). This EUA will remain  in effect (meaning this test can be used) for the duration of the COVID-19 declaration under Section 564(b)(1) of the Act, 21 U.S.C.section 360bbb-3(b)(1), unless the authorization is terminated  or revoked sooner.       Influenza A by PCR NEGATIVE NEGATIVE Final   Influenza B by PCR NEGATIVE NEGATIVE Final    Comment: (NOTE) The Xpert Xpress SARS-CoV-2/FLU/RSV plus assay is intended as an aid in the diagnosis of influenza from Nasopharyngeal swab specimens and should not be used as a sole basis for treatment. Nasal washings and aspirates are unacceptable for Xpert Xpress SARS-CoV-2/FLU/RSV testing.  Fact Sheet for Patients: EntrepreneurPulse.com.au  Fact Sheet for Healthcare  Providers: IncredibleEmployment.be  This test is not yet approved or cleared by the [place] FDA and has been authorized for detection and/or diagnosis of SARS-CoV-2 by FDA under an Emergency Use Authorization (EUA). This EUA will remain in effect (meaning this test can be used) for the duration of the COVID-19 declaration under Section 564(b)(1) of the Act, 21 U.S.C. section 360bbb-3(b)(1), unless the authorization is terminated or revoked.     Resp Syncytial Virus by PCR NEGATIVE NEGATIVE Final    Comment: (NOTE) Fact Sheet for Patients: EntrepreneurPulse.com.au  Fact Sheet for Healthcare Providers: IncredibleEmployment.be  This test is not yet approved or cleared by the [place] FDA and has been authorized for detection and/or diagnosis of SARS-CoV-2 by FDA under an Emergency Use Authorization (EUA). This EUA will remain in effect (meaning this test can be used) for the duration of the COVID-19 declaration under Section 564(b)(1) of the Act, 21 U.S.C. section 360bbb-3(b)(1), unless the authorization is terminated or revoked.  Performed at [facility], [place] [address], [place], [place] [address]   MRSA Next Gen by PCR, Nasal     Status: None   Collection Time: 08/16/22  9:35 AM   Specimen: Nasal Mucosa; Nasal Swab  Result Value Ref Range Status   MRSA by PCR Next Gen NOT DETECTED NOT DETECTED Final    Comment: (NOTE) The GeneXpert MRSA Assay (FDA approved for NASAL specimens only), is one component of a comprehensive MRSA colonization surveillance program. It is not intended to diagnose MRSA infection nor to guide or monitor treatment for MRSA infections. Test performance is not FDA approved in patients less than 60 years old. Performed at [facility], [place] [address], [place], [place] [address]      Radiology Studies: No results found.  Scheduled Meds:  aspirin EC  81 mg Oral Daily    Chlorhexidine Gluconate Cloth  6 each Topical Daily   cholecalciferol  2,000 Units Oral Daily   docusate sodium  100 mg Oral BID   folic acid  1 mg Oral Daily   gabapentin  100 mg Oral QHS   guaiFENesin  5 mL Oral QID   insulin aspart  0-15 Units Subcutaneous TID WC   metoprolol succinate  100 mg Oral Daily   nystatin cream   Topical BID   [START ON 08/20/2022] pantoprazole  40 mg Oral Daily   rosuvastatin  40 mg Oral QHS   senna  1 tablet Oral BID   Continuous Infusions:  sodium chloride Stopped (08/17/22 0012)     LOS: 6 days   Shelly Coss, MD [facility] P12/27/2023, 1:04 PM

## 2022-08-19 NOTE — Progress Notes (Signed)
Physical Therapy Treatment Patient Details Name: Erika Gates MRN: [id number] DOB: 25-Dec-1934 Today's Date: 08/19/2022   History of Present Illness Pt is an 87 year old admitted with progressive bilateral upper extremity and lower extremity weakness. MRI of the cervical spine demonstrates high-grade stenosis at C3-4 C4-5 and C5-6; C3-4 and 5 6 with evidence of cord compression. 12/22 ACDF C3-6. PEA arrest 12/24, ROSC 7 minutes.  Intubated 12/24-12/25. PMH: CAD s/p CABG, Anxiety, HTN, hyperlipidemia and morbid obesity    PT Comments    Pt greeted sidelying in bed and agreeable to session with incremental progress towards acute goals. Pt able to come to sitting EOB with max assist and return to supine at end of session with mod assist. Pt able to complete x3 standing trials from slightly elevated EOB to RW with minimal hip clearance secondary to weakness and fatigue. Pt with fair tolerance for seated LE therex for increased ROM and strength. Current plan remains appropriate to address deficits and maximize functional independence and decrease caregiver burden. Pt continues to benefit from skilled PT services to progress toward functional mobility goals.     Recommendations for follow up therapy are one component of a multi-disciplinary discharge planning process, led by the attending physician.  Recommendations may be updated based on patient status, additional functional criteria and insurance authorization.  Follow Up Recommendations  Acute inpatient rehab (3hours/day)     Assistance Recommended at Discharge Frequent or constant Supervision/Assistance  Patient can return home with the following Two people to help with walking and/or transfers;A lot of help with bathing/dressing/bathroom;Assistance with cooking/housework;Assist for transportation;Help with stairs or ramp for entrance   Equipment Recommendations  Rolling walker (2 wheels)    Recommendations for Other Services        Precautions / Restrictions Precautions Precautions: Fall;Cervical;Other (comment) Precaution Booklet Issued: No Precaution Comments: watch HR Required Braces or Orthoses:  (no brace needed) Restrictions Weight Bearing Restrictions: No Other Position/Activity Restrictions: no brace needed     Mobility  Bed Mobility Overal bed mobility: Needs Assistance Bed Mobility: Rolling, Sidelying to Sit, Sit to Sidelying Rolling: Max assist Sidelying to sit: Max assist     Sit to sidelying: Mod assist General bed mobility comments: Good initiation from pt. Assist with all aspects of mobility    Transfers Overall transfer level: Needs assistance Equipment used: Rolling walker (2 wheels) Transfers: Sit to/from Stand Sit to Stand: From elevated surface, Max assist           General transfer comment: pt able to minimally clear hips with RW and max assist    Ambulation/Gait               General Gait Details: unable   Stairs             Wheelchair Mobility    Modified Rankin (Stroke Patients Only)       Balance Overall balance assessment: Needs assistance Sitting-balance support: No upper extremity supported, Feet supported, Single extremity supported Sitting balance-Leahy Scale: Fair Sitting balance - Comments: Pt sat EOB x 10-12 minutes min guard assist   Standing balance support: Bilateral upper extremity supported, During functional activity Standing balance-Leahy Scale: Poor Standing balance comment: torso leans/goes to her right                            Cognition Arousal/Alertness: Awake/alert Behavior During Therapy: Flat affect Overall Cognitive Status: No family/caregiver present to determine baseline cognitive functioning Area of Impairment: Orientation,  Memory, Problem solving, Following commands                 Orientation Level: Disoriented to, Situation   Memory: Decreased short-term memory, Decreased recall of  precautions Following Commands: Follows one step commands consistently, Follows one step commands with increased time     Problem Solving: Slow processing, Requires verbal cues, Difficulty sequencing General Comments: follow simple commands with increased time, good initation but difficulty sequencing.        Exercises General Exercises - Lower Extremity Long Arc Quad: AROM, Right, Left, Seated, 10 reps Hip Flexion/Marching: AROM, Strengthening, Both, 20 reps, Seated    General Comments General comments (skin integrity, edema, etc.): VSS on supplemental O2      Pertinent Vitals/Pain Pain Assessment Pain Assessment: Faces Faces Pain Scale: Hurts a little bit Pain Location: generalized Pain Descriptors / Indicators: Grimacing Pain Intervention(s): Monitored during session, Limited activity within patient's tolerance    Home Living                          Prior Function            PT Goals (current goals can now be found in the care plan section) Acute Rehab PT Goals Patient Stated Goal: to walk PT Goal Formulation: With patient Time For Goal Achievement: 08/29/22    Frequency    Min 3X/week      PT Plan      Co-evaluation              AM-PAC PT "6 Clicks" Mobility   Outcome Measure  Help needed turning from your back to your side while in a flat bed without using bedrails?: A Lot Help needed moving from lying on your back to sitting on the side of a flat bed without using bedrails?: Total Help needed moving to and from a bed to a chair (including a wheelchair)?: Total Help needed standing up from a chair using your arms (e.g., wheelchair or bedside chair)?: Total Help needed to walk in hospital room?: Total Help needed climbing 3-5 steps with a railing? : Total 6 Click Score: 7    End of Session   Activity Tolerance: Patient tolerated treatment well Patient left: in bed;with call bell/phone within reach;with bed alarm set Nurse  Communication: Mobility status PT Visit Diagnosis: Unsteadiness on feet (R26.81);Pain;Muscle weakness (generalized) (M62.81);Other symptoms and signs involving the nervous system (R29.898) Pain - part of body: Shoulder;Knee     Time: [id number] PT Time Calculation (min) (ACUTE ONLY): 23 min  Charges:  $Therapeutic Activity: 23-37 mins                     Anayia Eugene R. PTA Acute Rehabilitation Services Office: [place] 08/19/2022, 4:16 PM

## 2022-08-19 NOTE — Plan of Care (Signed)
  Problem: Education: Goal: Knowledge of General Education information will improve Description: Including pain rating scale, medication(s)/side effects and non-pharmacologic comfort measures Outcome: Progressing   Problem: Health Behavior/Discharge Planning: Goal: Ability to manage health-related needs will improve Outcome: Progressing   Problem: Clinical Measurements: Goal: Ability to maintain clinical measurements within normal limits will improve Outcome: Progressing Goal: Will remain free from infection Outcome: Progressing Goal: Diagnostic test results will improve Outcome: Progressing Goal: Respiratory complications will improve Outcome: Progressing Goal: Cardiovascular complication will be avoided Outcome: Progressing   Problem: Activity: Goal: Risk for activity intolerance will decrease Outcome: Progressing   Problem: Nutrition: Goal: Adequate nutrition will be maintained Outcome: Progressing   Problem: Coping: Goal: Level of anxiety will decrease Outcome: Progressing   Problem: Elimination: Goal: Will not experience complications related to bowel motility Outcome: Progressing Goal: Will not experience complications related to urinary retention Outcome: Progressing   Problem: Pain Managment: Goal: General experience of comfort will improve Outcome: Progressing   Problem: Safety: Goal: Ability to remain free from injury will improve Outcome: Progressing   Problem: Skin Integrity: Goal: Risk for impaired skin integrity will decrease Outcome: Progressing   Problem: Education: Goal: Ability to verbalize activity precautions or restrictions will improve Outcome: Progressing Goal: Knowledge of the prescribed therapeutic regimen will improve Outcome: Progressing Goal: Understanding of discharge needs will improve Outcome: Progressing   Problem: Activity: Goal: Ability to avoid complications of mobility impairment will improve Outcome: Progressing Goal:  Ability to tolerate increased activity will improve Outcome: Progressing Goal: Will remain free from falls Outcome: Progressing   Problem: Bowel/Gastric: Goal: Gastrointestinal status for postoperative course will improve Outcome: Progressing   Problem: Clinical Measurements: Goal: Ability to maintain clinical measurements within normal limits will improve Outcome: Progressing Goal: Postoperative complications will be avoided or minimized Outcome: Progressing Goal: Diagnostic test results will improve Outcome: Progressing   Problem: Pain Management: Goal: Pain level will decrease Outcome: Progressing   Problem: Skin Integrity: Goal: Will show signs of wound healing Outcome: Progressing   Problem: Health Behavior/Discharge Planning: Goal: Identification of resources available to assist in meeting health care needs will improve Outcome: Progressing   Problem: Bladder/Genitourinary: Goal: Urinary functional status for postoperative course will improve Outcome: Progressing   Problem: Education: Goal: Ability to describe self-care measures that may prevent or decrease complications (Diabetes Survival Skills Education) will improve Outcome: Progressing Goal: Individualized Educational Video(s) Outcome: Progressing   Problem: Coping: Goal: Ability to adjust to condition or change in health will improve Outcome: Progressing   Problem: Fluid Volume: Goal: Ability to maintain a balanced intake and output will improve Outcome: Progressing   Problem: Health Behavior/Discharge Planning: Goal: Ability to identify and utilize available resources and services will improve Outcome: Progressing Goal: Ability to manage health-related needs will improve Outcome: Progressing   Problem: Metabolic: Goal: Ability to maintain appropriate glucose levels will improve Outcome: Progressing   Problem: Nutritional: Goal: Maintenance of adequate nutrition will improve Outcome:  Progressing Goal: Progress toward achieving an optimal weight will improve Outcome: Progressing   Problem: Skin Integrity: Goal: Risk for impaired skin integrity will decrease Outcome: Progressing   Problem: Tissue Perfusion: Goal: Adequacy of tissue perfusion will improve Outcome: Progressing   

## 2022-08-19 NOTE — Progress Notes (Signed)
Re: Erika Gates DOB: 07-Nov-1934 Date: 08/19/2022   To Whom It May Concern:  Please be advised that the above-named patient will require a short-term nursing home stay--anticipated 30 days or less for rehabilitation and strengthening. The plan is for home.

## 2022-08-19 NOTE — Consult Note (Addendum)
Cardiology Consultation   Patient ID: Erika Gates MRN: [id number]; DOB: [birth year]  Admit date: 08/10/2022 Date of Consult: 08/19/2022  PCP:  Donald Prose, [facility] Cardiologist:  Sinclair Grooms, MD        Patient Profile:   Erika Gates is a 87 y.o. female with a hx of CAD s/p CABG (three-vessel coronary bypass 1996) hypertension, hyperlipidemia, HFpEF, DM type II and morbid obesity who is being seen 08/19/2022 for the evaluation of atrial fibrillation with RVR at the request of Dr. Tawanna Solo.  History of Present Illness:   [name] presented to the hospital on 12/18 with symptoms of progressive bilateral upper extremity/lower extremity weakness. Workup found spondylolysis with myelopathy, quadriparesis. CT scan showed severe multilevel degenerative disease. Neurosurgery consulted and patient underwent anterior cervical spine decompression, arthrodesis with structural spacer and allograft anterior plate fixation [id number] on 12/22. Patient initially recovered well but was noted with altered mental status on 12/24 and was found in PEA arrest. She received 7 minutes of ACLS and had ROSC. She was then transferred to the ICU. On 12/26, patient noted with new atrial fibrillation in the setting of electrolyte derangements. She is now with [facility] following extubation and improved clinical condition but continues with afib.  On my exam today, patient is tired appearing but in no acute distress. She reports feeling better today and denies noticing palpitations/racing heart beat, shortness of breath, or chest pain.   Cardiac history:  Patient is followed by Dr. Tamala Julian, last seen 10/28/21.   Past Medical History:  Diagnosis Date   Allergic rhinitis    Anemia    Anxiety    Coronary atherosclerosis of unspecified type of vessel, native or graft    Diabetes mellitus    DJD (degenerative joint disease)    Hypercholesteremia    Hypertension     Low back pain    Morbid obesity (HCC)    Venous insufficiency    Vitamin D deficiency     Past Surgical History:  Procedure Laterality Date   ABDOMINAL HYSTERECTOMY     ANTERIOR CERVICAL DECOMP/DISCECTOMY FUSION N/A 08/14/2022   Procedure: Cerivcal Three-Four, Cerivcal Four-Five, Cerivcal Five-Six Anterior Cervical Discectomy Fusion;  Surgeon: Kristeen Miss, MD;  Location: [facility];  Service: Neurosurgery;  Laterality: N/A;  RM 19   CORONARY ARTERY BYPASS GRAFT     3 vessel- Dr. Cyndia Bent 8/96     Home Medications:  Prior to Admission medications   Medication Sig Start Date End Date Taking? Authorizing Provider  acetaminophen (TYLENOL) 500 MG tablet Take 1,000 mg by mouth every 6 (six) hours as needed for mild pain.   Yes [provider]  ALPRAZolam (XANAX) 0.5 MG tablet TAKE 1 TABLET BY MOUTH 3 TIMES A DAY AS NEEDED FOR NERVES Patient taking differently: Take 0.5 mg by mouth in the morning and at bedtime. 12/11/13  Yes Noralee Space, MD  amLODipine (NORVASC) 5 MG tablet Take 1 tablet (5 mg total) by mouth daily. 08/29/21  Yes Belva Crome, MD  aspirin EC 81 MG tablet Take 81 mg by mouth daily. Swallow whole.   Yes [provider]  Cholecalciferol (VITAMIN D) 1000 UNITS capsule Take 2,000 Units by mouth daily.    Yes [provider]  cyclobenzaprine (FLEXERIL) 10 MG tablet Take 10 mg by mouth 2 (two) times daily as needed for muscle spasms.   Yes [provider]  dapagliflozin propanediol (FARXIGA) 10 MG TABS tablet Take  1 tablet (10 mg total) by mouth daily before breakfast. 02/02/22  Yes Belva Crome, MD  diclofenac sodium (VOLTAREN) 1 % GEL APPLY 1 GM TO AFFECTED AREA UP TO 3 TIMES A DAY AS NEEDED Patient taking differently: Apply 2 g topically daily as needed (pain). 05/17/17  Yes Deveshwar, Abel Presto, MD  furosemide (LASIX) 20 MG tablet Take 1 tablet (20 mg total) by mouth daily. 07/18/21 08/11/22 Yes Oswald Hillock, MD  gabapentin (NEURONTIN) 100 MG  capsule Take 1 capsule (100 mg total) by mouth at bedtime. Patient taking differently: Take 100 mg by mouth in the morning. 03/17/18  Yes Ofilia Neas, PA-C  gabapentin (NEURONTIN) 300 MG capsule Take 300 mg by mouth at bedtime. 05/24/21  Yes [provider]  lisinopril (ZESTRIL) 20 MG tablet Take 1 tablet (20 mg total) by mouth daily. 07/18/21 08/11/22 Yes Oswald Hillock, MD  metoprolol succinate (TOPROL-XL) 100 MG 24 hr tablet Take 1 tablet (100 mg total) by mouth daily. 12/11/13  Yes Noralee Space, MD  Multiple Vitamins-Minerals (PROTEGRA CARDIO PO) Take 1 capsule by mouth daily.     Yes [provider]  rosuvastatin (CRESTOR) 40 MG tablet Take 40 mg by mouth daily. 02/23/17  Yes [provider]  vitamin E 100 UNIT capsule Take 100 Units by mouth daily.   Yes [provider]    Inpatient Medications: Scheduled Meds:  aspirin EC  81 mg Oral Daily   Chlorhexidine Gluconate Cloth  6 each Topical Daily   cholecalciferol  2,000 Units Oral Daily   docusate sodium  100 mg Oral BID   folic acid  1 mg Oral Daily   gabapentin  100 mg Oral QHS   guaiFENesin  5 mL Oral QID   insulin aspart  0-15 Units Subcutaneous TID WC   metoprolol succinate  100 mg Oral Daily   nystatin cream   Topical BID   [START ON 08/20/2022] pantoprazole  40 mg Oral Daily   rosuvastatin  40 mg Oral QHS   senna  1 tablet Oral BID   Continuous Infusions:  sodium chloride Stopped (08/17/22 0012)   PRN Meds: acetaminophen **OR** acetaminophen, bisacodyl, clonazepam, lip balm, menthol-cetylpyridinium **OR** phenol, metoprolol tartrate, ondansetron **OR** ondansetron (ZOFRAN) IV, polyethylene glycol, sodium chloride, sodium phosphate  Allergies:    Allergies  Allergen Reactions   Metformin Other (See Comments)    REACTION: pt refuses METFORMIN "it made me sad"...    Social History:   Social History   Socioeconomic History   Marital status: Widowed    Spouse name: Not on file    Number of children: Not on file   Years of education: Not on file   Highest education level: Not on file  Occupational History   Occupation: retired  Tobacco Use   Smoking status: Never   Smokeless tobacco: Never  Vaping Use   Vaping Use: Never used  Substance and Sexual Activity   Alcohol use: No   Drug use: Never   Sexual activity: Not on file  Other Topics Concern   Not on file  Social History Narrative   Not on file   Social Determinants of Health   Financial Resource Strain: Not on file  Food Insecurity: No Food Insecurity (08/11/2022)   Hunger Vital Sign    Worried About Running Out of Food in the Last Year: Never true    Ran Out of Food in the Last Year: Never true  Transportation Needs: No Transportation Needs (08/11/2022)  PRAPARE - Hydrologist (Medical): No    Lack of Transportation (Non-Medical): No  Physical Activity: Not on file  Stress: Not on file  Social Connections: Not on file  Intimate Partner Violence: Not At Risk (08/11/2022)   Humiliation, Afraid, Rape, and Kick questionnaire    Fear of Current or Ex-Partner: No    Emotionally Abused: No    Physically Abused: No    Sexually Abused: No    Family History:    Family History  Problem Relation Age of Onset   Heart disease Mother    Heart disease Brother      ROS:  Please see the history of present illness.   All other ROS reviewed and negative.     Physical Exam/Data:   Vitals:   08/19/22 0404 08/19/22 0738 08/19/22 0751 08/19/22 1211  BP: 125/77  (!) 130/56 122/66  Pulse: (!) 102 100  (!) 107  Resp: 18 14  20   Temp: (!) 97.5 F (36.4 C) 99 F (37.2 C)  98.2 F (36.8 C)  TempSrc: Oral Oral  Oral  SpO2: 99%   98%  Weight:      Height:        Intake/Output Summary (Last 24 hours) at 08/19/2022 1445 Last data filed at 08/19/2022 0900 Gross per 24 hour  Intake 320 ml  Output 650 ml  Net -330 ml      08/18/2022    2:00 AM 08/14/2022   12:15 PM  10/28/2021   11:43 AM  Last 3 Weights  Weight (lbs) 211 lb 3.2 oz 227 lb 1.2 oz 226 lb 12.8 oz  Weight (kg) 95.8 kg 103 kg 102.876 kg     Body mass index is 39.91 kg/m.  General:  Well nourished, well developed, in no acute distress HEENT: normal Neck: no JVD Vascular: No carotid bruits; Distal pulses 2+ bilaterally Cardiac:  normal S1, S2; irregularly irregular; no murmur  Lungs:  upper airway/tracheal secretion sounds. Otherwise found clear to auscultation bilaterally, no wheezing, rhonchi or rales  Abd: soft, nontender, no hepatomegaly  Ext: no edema. SCDs in place Musculoskeletal:  No deformities, BUE and BLE strength normal and equal Skin: warm and dry  Neuro:  CNs 2-12 intact, no focal abnormalities noted Psych:  Mild confusion  EKG:  No tracing since 12/25 Telemetry:  Telemetry was personally reviewed and demonstrates:  rate controlled atrial fibrillation, ventricular rates   Relevant CV Studies: IMPRESSIONS     1. Left ventricular ejection fraction, by estimation, is 60 to 65%. The  left ventricle has normal function. The left ventricle has no regional  wall motion abnormalities. There is mild left ventricular hypertrophy.  Left ventricular diastolic parameters  are consistent with Grade I diastolic dysfunction (impaired relaxation).  Elevated left ventricular end-diastolic pressure. The E/e' is 75.   2. Right ventricular systolic function is mildly reduced. The right  ventricular size is normal. There is mildly elevated pulmonary artery  systolic pressure. The estimated right ventricular systolic pressure is  123456 mmHg.   3. The mitral valve is grossly normal. Trivial mitral valve  regurgitation.   4. The aortic valve is tricuspid. Aortic valve regurgitation is not  visualized.   5. The inferior vena cava is dilated in size with <50% respiratory  variability, suggesting right atrial pressure of 15 mmHg.   Comparison(s): No prior Echocardiogram.   FINDINGS   Left  Ventricle: Left ventricular ejection fraction, by estimation, is 60  to 65%. The left  ventricle has normal function. The left ventricle has no  regional wall motion abnormalities. The left ventricular internal cavity  size was normal in size. There is   mild left ventricular hypertrophy. Left ventricular diastolic parameters  are consistent with Grade I diastolic dysfunction (impaired relaxation).  Elevated left ventricular end-diastolic pressure. The E/e' is 63.   Right Ventricle: The right ventricular size is normal. No increase in  right ventricular wall thickness. Right ventricular systolic function is  mildly reduced. There is mildly elevated pulmonary artery systolic  pressure. The tricuspid regurgitant velocity   is 2.34 m/s, and with an assumed right atrial pressure of 15 mmHg, the  estimated right ventricular systolic pressure is 123456 mmHg.   Left Atrium: Left atrial size was normal in size.   Right Atrium: Right atrial size was normal in size.   Pericardium: There is no evidence of pericardial effusion.   Mitral Valve: The mitral valve is grossly normal. Trivial mitral valve  regurgitation.   Tricuspid Valve: The tricuspid valve is grossly normal. Tricuspid valve  regurgitation is trivial.   Aortic Valve: The aortic valve is tricuspid. Aortic valve regurgitation is  not visualized. Aortic valve peak gradient measures 10.1 mmHg.   Pulmonic Valve: The pulmonic valve was grossly normal. Pulmonic valve  regurgitation is trivial.   Aorta: The aortic root and ascending aorta are structurally normal, with  no evidence of dilitation.   Venous: The inferior vena cava is dilated in size with less than 50%  respiratory variability, suggesting right atrial pressure of 15 mmHg.   IAS/Shunts: No atrial level shunt detected by color flow Doppler.    Laboratory Data:  High Sensitivity Troponin:   Recent Labs  Lab 08/10/22 1649 08/10/22 1940 08/16/22 0755  TROPONINIHS 11 11  61*     Chemistry Recent Labs  Lab 08/17/22 0207 08/18/22 0320 08/19/22 0803  NA 142 144 144  K 4.3 3.1* 4.1  CL 111 114* 111  CO2 23 22 26   GLUCOSE 107* 77 107*  BUN 31* 14 16  CREATININE 0.96 0.55 0.82  CALCIUM 10.0 8.9 9.6  MG 2.2 1.6* 2.5*  GFRNONAA 58* >60 >60  ANIONGAP 8 8 7     Recent Labs  Lab 08/16/22 0755 08/18/22 0320  PROT 3.2* 5.3*  ALBUMIN <1.5* 2.1*  AST 201* 76*  ALT 100* 69*  ALKPHOS 34* 62  BILITOT 0.3 0.6   Lipids No results for input(s): "CHOL", "TRIG", "HDL", "LABVLDL", "LDLCALC", "CHOLHDL" in the last 168 hours.  Hematology Recent Labs  Lab 08/16/22 1055 08/17/22 0446 08/18/22 0320  WBC 8.5 6.3 6.7  RBC 3.03* 3.48* 3.33*  HGB 8.6* 9.9* 9.2*  HCT 28.5* 32.1* 30.5*  MCV 94.1 92.2 91.6  MCH 28.4 28.4 27.6  MCHC 30.2 30.8 30.2  RDW 15.6* 15.5 15.2  PLT PLATELET CLUMPS NOTED ON SMEAR, COUNT APPEARS DECREASED 116* 130*   Thyroid No results for input(s): "TSH", "FREET4" in the last 168 hours.  BNPNo results for input(s): "BNP", "PROBNP" in the last 168 hours.  DDimer No results for input(s): "DDIMER" in the last 168 hours.   Radiology/Studies:  DG Abd Portable 1V  Result Date: 08/16/2022 CLINICAL DATA:  OG tube placement EXAM: PORTABLE ABDOMEN - 1 VIEW COMPARISON:  None Available. FINDINGS: There is an enteric tube with tip and side port in the expected location of the distal stomach. The tube appears looped within the gastric fundus. Decreased aeration to the right lower lung is again noted compatible with atelectasis or infiltrate.  IMPRESSION: Enteric tube tip and side port are in the expected location of the distal stomach. Electronically Signed   By: Kerby Moors M.D.   On: 08/16/2022 09:17   DG CHEST PORT 1 VIEW  Result Date: 08/16/2022 CLINICAL DATA:  Intubation EXAM: PORTABLE CHEST 1 VIEW COMPARISON:  Six days ago FINDINGS: New endotracheal tube with tip just below the clavicular heads. Artifact from overlapping hardware.  Cardiomegaly.  CABG. Airspace disease at the bases superimposed on the chronically elevated right diaphragm. No effusion or pneumothorax. IMPRESSION: 1. Unremarkable endotracheal tube positioning. 2. Worsening aeration at the bases which could be atelectasis or infiltrate. Electronically Signed   By: Jorje Guild M.D.   On: 08/16/2022 07:22     Assessment and Plan:  Erika Gates is a 87 y.o. female with a hx of CAD s/p CABG (three-vessel coronary bypass 1996) hypertension, hyperlipidemia, HFpEF, DM type II and morbid obesity who is being seen 08/19/2022 for the evaluation of atrial fibrillation with RVR at the request of Dr. Tawanna Solo.   Atrial fibrillation with RVR Secondary hypercoagulable state  On 12/26, patient noted with new atrial fibrillation in the setting of electrolyte derangements. This was 2 days post PEA arrest with 7 minutes of ACLS to ROSC. She is now with [facility] following extubation and improved clinical condition but continues with afib.  Currently on home Metoprolol Succinate 100mg . Appears to have decreasing rates today. At the time of my exam, patient comfortable in bed with ventricular rate in the 80s, 90s. Continue rate control with current regimen. A short-acting calcium channel blocker would be appropriate to use PRN with breakthrough RVR.  Patient would benefit from stroke prevention regimen with CHA2DS2-VASc Score = 7. Recommend discussing [place] with neurosurgery status post c-spine surgery.  Chronic HFpEF Hypertension  Patient with LVEF 123456, grade I diastolic dysfunction on [id number] TTE. Home GDMT of Farxiga, Lasix 20mg , Lisinopril 20mg , Metoprolol Succinate 100mg , Amlodipine 5mg . These were held in the setting of PEA arrest and acute decompensation. BP now improving. Appears euvolemic on exam, no evidence of acute volume overload.  BP now improving. Recommend resuming home meds as able with improving clinical condition/stable vital signs.  CAD s/p  CABG  Continue statin and metoprolol.    Risk Assessment/Risk Scores:       CHA2DS2-VASc Score = 7   This indicates a 11.2% annual risk of stroke. The patient's score is based upon: CHF History: 1 HTN History: 1 Diabetes History: 1 Stroke History: 0 Vascular Disease History: 1 Age Score: 2 Gender Score: 1         For questions or updates, please contact [place] Please consult [email or web address] for contact info under    Signed, Lily Kocher, PA-C  08/19/2022 2:45 PM  I have personally seen and examined this patient. I agree with the assessment and plan as outlined above.  87 yo female with CAD s/p CABG, HTN, HLD, diastolic CHF, DM admitted with neurological issues (myelopathy and quadriparesis) requiring cervical decompression. 2 days later she had a PEA arrest felt to be due to polypharmacy. New onset atrial fibrillation 08/18/22. Cardiology consulted for atrial fib with RVR.   She has no complaints today HR is in the 90s on telemetry Labs reviewed EKG reviewed by me shows atrial fib, HR 105 bpm with RBBB  My exam:  General: Obese female in NAD.  SKIN: warm, dry. No rashes. Psychiatric: Mood and affect normal  Neck: No JVD Lungs:Clear bilaterally, no wheezes, rhonci, crackles Cardiovascular: Irregular  irregular. No murmurs, gallops or rubs. Abdomen:Soft. Extremities: No lower extremity edema.   Plan: Atrial fibrillation, new onset: Heart rate currently well controlled. Continue metoprolol. She has a CHADS VASC score of 7 with annual stroke risk of over 11% if not anticoagulated. Long term anticoagulation is recommended. The timing of when to start anti-coagulation per primary and Neurosurgery teams.  Cardiology will sign off. Please call with questions.   Verne Carrow, MD, [facility] 08/19/2022 3:27 PM

## 2022-08-19 NOTE — Progress Notes (Signed)
Speech Language Pathology Treatment: Dysphagia  Patient Details Name: Erika Gates MRN: [id number] DOB: [birth year] Today's Date: 08/19/2022 Time: 7218-2883 SLP Time Calculation (min) (ACUTE ONLY): 16 min  Assessment / Plan / Recommendation Clinical Impression  Pt seen for a dysphagia f/u tx session with intake of soft solids (pears)/thin liquids via straw with guided sips, mod I verbal cues, effortful swallow and alternating solids/liquids to moisten oral mucosa d/t presence of Loraine and xerostomia per pt report/observation.  Pt consumed food/liquids without overt s/sx of aspiration present during short trial with multiple swallows noted x1 with thin liquids when volume increased.  Discussed safe swallow precautions including small bites/sips and slow rate paired with above mentioned strategies to A with breathing/swallowing reciprocity and potential laryngeal edema from recent ACDF.  Discussed with family/pt need for objective assessment if pt begins to exhibit any s/sx of aspiration once d/c as this procedure could be completed as an OP prn.  Family in agreement.  Recommend continuing Dysphagia 3/thin liquids with strategies implemented and FULL precautions.  ST will continue to f/u in acute setting for dysphagia tx/diet progression.     HPI HPI: Patient is an 87 y.o. female with PMH: CAD s/p CABG, Anxiety, HTN, hyperlipidemia and morbid obesity. She presented to the hospital on 08/13/22 with progressive bilateral upper extremity and lower extremity weakness. MRI of the cervical spine demonstrates high-grade stenosis at C3-4 C4-5 and C5-6; C3-4 and 5 6 with evidence of cord compression. On 12/22 she underwent ACDF C3-6. PEA arrest occured 12/24, ROSC 7 minutes. Intubated 12/24-12/25; ST completed BSE on 12/26 recommending Dysphagia 3 (mechanical soft)/thin liquids.  ST f/u for diet tolerance.      SLP Plan  Continue with current plan of care      Recommendations for follow up therapy are one  component of a multi-disciplinary discharge planning process, led by the attending physician.  Recommendations may be updated based on patient status, additional functional criteria and insurance authorization.    Recommendations  Diet recommendations: Dysphagia 3 (mechanical soft);Thin liquid Liquids provided via: Cup;Straw Medication Administration: Whole meds with puree Supervision: Patient able to self feed;Staff to assist with self feeding;Intermittent supervision to cue for compensatory strategies Compensations: Slow rate;Small sips/bites;Effortful swallow;Follow solids with liquid Postural Changes and/or Swallow Maneuvers: Seated upright 90 degrees                Oral Care Recommendations: Oral care BID Follow Up Recommendations: Follow physician's recommendations for discharge plan and follow up therapies Assistance recommended at discharge: Frequent or constant Supervision/Assistance SLP Visit Diagnosis: Dysphagia, unspecified (R13.10) Plan: Continue with current plan of care           Tressie Stalker, M.S., CCC-SLP  08/19/2022, 1:57 PM

## 2022-08-20 DIAGNOSIS — R29898 Other symptoms and signs involving the musculoskeletal system: Secondary | ICD-10-CM | POA: Diagnosis not present

## 2022-08-20 LAB — GLUCOSE, CAPILLARY
Glucose-Capillary: 121 mg/dL — ABNORMAL HIGH (ref 70–99)
Glucose-Capillary: 151 mg/dL — ABNORMAL HIGH (ref 70–99)
Glucose-Capillary: 129 mg/dL — ABNORMAL HIGH (ref 70–99)
Glucose-Capillary: 149 mg/dL — ABNORMAL HIGH (ref 70–99)

## 2022-08-20 MED ORDER — FUROSEMIDE 20 MG PO TABS
20.0000 mg | ORAL_TABLET | Freq: Every day | ORAL | Status: DC
  Administered 2022-08-20 – 2022-08-26 (×7): 20 mg via ORAL
  Filled 2022-08-20 (×7): qty 1

## 2022-08-20 MED ORDER — FUROSEMIDE 20 MG PO TABS: 20.0000 mg | ORAL_TABLET | Freq: Every day | ORAL | Status: DC

## 2022-08-20 MED ORDER — APIXABAN 5 MG PO TABS
5.0000 mg | ORAL_TABLET | Freq: Two times a day (BID) | ORAL | Status: DC
  Administered 2022-08-21 – 2022-08-27 (×13): 5 mg via ORAL
  Filled 2022-08-20 (×13): qty 1

## 2022-08-20 NOTE — Progress Notes (Signed)
ANTICOAGULATION CONSULT NOTE - Initial Consult  Pharmacy Consult for apixaban Indication: atrial fibrillation  Allergies  Allergen Reactions   Metformin Other (See Comments)    REACTION: pt refuses METFORMIN "it made me sad"...    Patient Measurements: Height: 5\' 1"  (154.9 cm) Weight: 95.8 kg (211 lb 3.2 oz) IBW/kg (Calculated) : 47.8 Heparin Dosing Weight:   Vital Signs: Temp: 98.9 F (37.2 C) (12/28 1632) Temp Source: Oral (12/28 1632) BP: 160/85 (12/28 1632) Pulse Rate: 86 (12/28 1632)  Labs: Recent Labs    08/18/22 0320 08/19/22 0803  HGB 9.2*  --   HCT 30.5*  --   PLT 130*  --   CREATININE 0.55 0.82    Estimated Creatinine Clearance: 53.1 mL/min (by C-G formula based on SCr of 0.82 mg/dL).   Medical History: Past Medical History:  Diagnosis Date   Allergic rhinitis    Anemia    Anxiety    Coronary atherosclerosis of unspecified type of vessel, native or graft    Diabetes mellitus    DJD (degenerative joint disease)    Hypercholesteremia    Hypertension    Low back pain    Morbid obesity (HCC)    Venous insufficiency    Vitamin D deficiency     Medications:  Medications Prior to Admission  Medication Sig Dispense Refill Last Dose   acetaminophen (TYLENOL) 500 MG tablet Take 1,000 mg by mouth every 6 (six) hours as needed for mild pain.   Past Week   ALPRAZolam (XANAX) 0.5 MG tablet TAKE 1 TABLET BY MOUTH 3 TIMES A DAY AS NEEDED FOR NERVES (Patient taking differently: Take 0.5 mg by mouth in the morning and at bedtime.) 90 tablet 2 Past Week   amLODipine (NORVASC) 5 MG tablet Take 1 tablet (5 mg total) by mouth daily. 90 tablet 3 Past Week   aspirin EC 81 MG tablet Take 81 mg by mouth daily. Swallow whole.   Past Week   Cholecalciferol (VITAMIN D) 1000 UNITS capsule Take 2,000 Units by mouth daily.    Past Week   cyclobenzaprine (FLEXERIL) 10 MG tablet Take 10 mg by mouth 2 (two) times daily as needed for muscle spasms.   unknown   dapagliflozin  propanediol (FARXIGA) 10 MG TABS tablet Take 1 tablet (10 mg total) by mouth daily before breakfast. 30 tablet 9 Past Week   diclofenac sodium (VOLTAREN) 1 % GEL APPLY 1 GM TO AFFECTED AREA UP TO 3 TIMES A DAY AS NEEDED (Patient taking differently: Apply 2 g topically daily as needed (pain).) 500 g 1 unknown   furosemide (LASIX) 20 MG tablet Take 1 tablet (20 mg total) by mouth daily. 30 tablet 11 Past Week   gabapentin (NEURONTIN) 100 MG capsule Take 1 capsule (100 mg total) by mouth at bedtime. (Patient taking differently: Take 100 mg by mouth in the morning.) 30 capsule 0 Past Week   gabapentin (NEURONTIN) 300 MG capsule Take 300 mg by mouth at bedtime.   Past Week   lisinopril (ZESTRIL) 20 MG tablet Take 1 tablet (20 mg total) by mouth daily. 30 tablet 11 Past Week   metoprolol succinate (TOPROL-XL) 100 MG 24 hr tablet Take 1 tablet (100 mg total) by mouth daily. 30 tablet 3 08/09/2022 at 1000   Multiple Vitamins-Minerals (PROTEGRA CARDIO PO) Take 1 capsule by mouth daily.     Past Week   rosuvastatin (CRESTOR) 40 MG tablet Take 40 mg by mouth daily.  3 Past Week   vitamin E 100 UNIT  capsule Take 100 Units by mouth daily.   Past Week   Scheduled:   aspirin EC  81 mg Oral Daily   Chlorhexidine Gluconate Cloth  6 each Topical Daily   cholecalciferol  2,000 Units Oral Daily   docusate sodium  100 mg Oral BID   folic acid  1 mg Oral Daily   gabapentin  100 mg Oral QHS   guaiFENesin  5 mL Oral QID   insulin aspart  0-15 Units Subcutaneous TID WC   metoprolol succinate  100 mg Oral Daily   pantoprazole  40 mg Oral Daily   rosuvastatin  40 mg Oral QHS   senna  1 tablet Oral BID   Infusions:   sodium chloride Stopped (08/17/22 0012)    Assessment: Pt with recent ACDF who was recently dx with afib. Ok to start apixaban on 12/29 per MD. Age>80, wt>60kg, scr<1.5  Scr<1 Hgb 9s Plt 130k Goal of Therapy:  Monitor platelets by anticoagulation protocol: Yes   Plan:  Apixaban 5mg  PO BID  start 12/29 Rx will f/u peripherally  Onnie Boer, PharmD, BCIDP, AAHIVP, CPP Infectious Disease Pharmacist 08/20/2022 4:52 PM

## 2022-08-20 NOTE — TOC Progression Note (Addendum)
Transition of Care [facility]) - Progression Note    Patient Details  Name: Erika Gates MRN: [id number] Date of Birth: May 27, 1935  [facility] CM/SW Contact  Kermit Balo, RN Phone Number: 08/20/2022, 1:46 PM  Clinical Narrative:    Both the SNF patient's family asked for have declined. CM updated her grandson this am. He asked that she be faxed out in the [facility] area. CM has provided the offers to the patients daughter at the bedside.  Daughter is requesting that a referral be sent to [facility] at [phone]. CM has faxed the patients information to [facility].  TOC following.  1436: CM was also asked to fax pt to [facility] in [place] for SNF rehab. CM has sent the referral via [facility].   Expected Discharge Plan: Skilled Nursing Facility Barriers to Discharge: Continued Medical Work up, SNF Pending bed offer  Expected Discharge Plan and Services In-house Referral: Clinical Social Work Discharge Planning Services: CM Consult Post Acute Care Choice: Skilled Nursing Facility Living arrangements for the past 2 months: Single Family Home                                       Social Determinants of Health (SDOH) Interventions SDOH Screenings   Food Insecurity: No Food Insecurity (08/11/2022)  Housing: Low Risk  (08/11/2022)  Transportation Needs: No Transportation Needs (08/11/2022)  Utilities: Not At Risk (08/11/2022)  Tobacco Use: Low Risk  (08/17/2022)    Readmission Risk Interventions     No data to display

## 2022-08-20 NOTE — Care Management Important Message (Signed)
Important Message  Patient Details  Name: Erika Gates MRN: [id number] Date of Birth: 02-18-1935   Medicare Important Message Given:  Yes     Dorena Bodo 08/20/2022, 2:45 PM

## 2022-08-20 NOTE — Progress Notes (Signed)
PROGRESS NOTE  Erika Gates  [id number] DOB: 03-10-1935 DOA: 08/10/2022 PCP: Donald Prose, MD   Brief Narrative: Patient is a 87 year old female with past medical history of coronary disease status post CABG, hypertension, hyperlipidemia, obesity who presented with progressive bilateral upper extremities, lower extremity weakness.  She was found to have a spondylolysis with myelopathy, quadriparesis.  Neurosurgery consulted and she underwent anterior cervical decompression, arthrodesis on  with structural spacer and allograft anterior plate fixation 624THL on 12/22.she had sudden onset of altered mental status and went into PEA arrest on 12/24.  Had 7 minutes of resuscitation finally achieving ROSC.  Transferred to ICU for further management and kept on vent.  Patient transferred to [facility] service on 12/27.  Hospital course remarkable for A-fib with RVR, currently recommending to start anticoagulation after neurosurgery clearance.  Trying to reach neurosurgery to discuss on plan for starting the anticoagulation  Assessment & Plan:  Principal Problem:   Upper extremity weakness Active Problems:   MORBID OBESITY   Essential hypertension   History of coronary artery disease s/p CABG    Weakness of left lower extremity   Anxiety   Cervical stenosis of spinal canal   Cardiac arrest: Suspected to be secondary to polypharmacy induced respiratory depression.  Currently alert and oriented.  Extubated. On 2 L of oxygen currently.  On home she is on 1 to 2 L as needed at night.  A-fib with RVR: New problem.CHADS2vasc score 6 .  Cardiology consulted, commended to continue metoprolol.  Rate is better today.  Cardiology recommended to start on anticoagulation.  Trying to reach out to neurosurgery to discuss on appropriate date to start on anticoagulation.  Message sent to Dr. Lanier Clam call back  Status post cervical spine decompression: Neurosurgery were following.  History of cervical spinal  stenosis, bilateral upper extremity weakness.  Status post decompression surgery on 12/22.  PT/OT recommending acute inpatient rehab  History of coronary artery disease: Status post CABG.  No anginal symptoms.  On metoprolol, statin  History of hypertension: On metoprolol, amlodipine, lisinopril at home.  AKI: Resolved  Diabetes: Continue sliding scale insulin  Thrombocytopenia: Stable.  Morbid obesity: BMI 39.9    Pressure Injury 08/16/22 Buttocks Medial Deep Tissue Pressure Injury - Purple or maroon localized area of discolored intact skin or blood-filled blister due to damage of underlying soft tissue from pressure and/or shear. (Active)  08/16/22 2000  Location: Buttocks  Location Orientation: Medial  Staging: Deep Tissue Pressure Injury - Purple or maroon localized area of discolored intact skin or blood-filled blister due to damage of underlying soft tissue from pressure and/or shear.  Wound Description (Comments):   Present on Admission:   Dressing Type Foam - Lift dressing to assess site every shift 08/20/22 0743    DVT prophylaxis:SCD's Start: 08/14/22 1315 Place and maintain sequential compression device Start: 08/13/22 1027     Code Status: Full Code  Family Communication: Grandson  at bedside  Patient status:Inpatient  Patient is from :Home  Anticipated discharge to:SNF  Estimated DC date:1-2 days   Consultants: Neurosurgery, PCCM, cardiology  Procedures: Intubation  Antimicrobials:  Anti-infectives (From admission, onward)    Start     Dose/Rate Route Frequency Ordered Stop   08/14/22 1800  ceFAZolin (ANCEF) IVPB 2g/100 mL premix        2 g 200 mL/hr over 30 Minutes Intravenous Every 8 hours 08/14/22 1314 08/15/22 1147   08/14/22 0733  ceFAZolin (ANCEF) 2-4 GM/100ML-% IVPB       Note  to Pharmacy: Cyndra Numbers (: cabinet override      08/14/22 4970 08/14/22 1944       Subjective: Patient seen and examined at bedside today.   Hemodynamically stable.  Heart heart rate is ranging from 90-110.  Remains asymptomatic.  Denies any worsening of shortness of breath.  Grandson at the bedside.  On 2 L of oxygen per minute  Objective: Vitals:   08/19/22 2349 08/20/22 0351 08/20/22 0801 08/20/22 1219  BP: (!) 144/83 131/62 (!) 148/84 (!) 149/80  Pulse: 100 (!) 104 95 94  Resp: 18 16 17 20   Temp: 98.8 F (37.1 C) 98.9 F (37.2 C) 98.8 F (37.1 C) 98.9 F (37.2 C)  TempSrc: Oral Oral Oral Oral  SpO2: 97% 90% 100% 94%  Weight:      Height:        Intake/Output Summary (Last 24 hours) at 08/20/2022 1300 Last data filed at 08/20/2022 1000 Gross per 24 hour  Intake 480 ml  Output --  Net 480 ml   Filed Weights   08/14/22 1215 08/18/22 0200  Weight: 103 kg 95.8 kg    Examination:   General exam: Overall comfortable, not in distress,obese HEENT: PERRL Respiratory system:  no wheezes or crackles  Cardiovascular system: Irregular regular rhythm.  Gastrointestinal system: Abdomen is nondistended, soft and nontender. Central nervous system: Alert and oriented Extremities: No edema, no clubbing ,no cyanosis Skin: No rashes, no ulcers,no icterus     Data Reviewed: I have personally reviewed following labs and imaging studies  CBC: Recent Labs  Lab 08/15/22 0234 08/16/22 0245 08/16/22 1055 08/17/22 0446 08/18/22 0320  WBC 9.6 8.4 8.5 6.3 6.7  NEUTROABS 8.2*  --  7.0  --  5.5  HGB 10.3* 9.9* 8.6* 9.9* 9.2*  HCT 32.7* 32.8* 28.5* 32.1* 30.5*  MCV 92.1 93.7 94.1 92.2 91.6  PLT 120* 117* PLATELET CLUMPS NOTED ON SMEAR, COUNT APPEARS DECREASED 116* 130*   Basic Metabolic Panel: Recent Labs  Lab 08/15/22 0234 08/16/22 0245 08/16/22 0755 08/17/22 0207 08/18/22 0320 08/19/22 0803  NA 135 136 133* 142 144 144  K 5.3* 4.8 4.6 4.3 3.1* 4.1  CL 103 106 108 111 114* 111  CO2 26 26 13* 23 22 26   GLUCOSE 178* 114* 111* 107* 77 107*  BUN 30* 38* 28* 31* 14 16  CREATININE 1.08* 1.25* 0.87 0.96 0.55 0.82   CALCIUM 9.4 9.4 8.1* 10.0 8.9 9.6  MG 2.3  --  1.4* 2.2 1.6* 2.5*  PHOS  --   --   --  2.5 1.7* 2.5     Recent Results (from the past 240 hour(s))  MRSA Next Gen by PCR, Nasal     Status: None   Collection Time: 08/16/22  9:35 AM   Specimen: Nasal Mucosa; Nasal Swab  Result Value Ref Range Status   MRSA by PCR Next Gen NOT DETECTED NOT DETECTED Final    Comment: (NOTE) The GeneXpert MRSA Assay (FDA approved for NASAL specimens only), is one component of a comprehensive MRSA colonization surveillance program. It is not intended to diagnose MRSA infection nor to guide or monitor treatment for MRSA infections. Test performance is not FDA approved in patients less than 38 years old. Performed at [facility], [address], [place], [address] [place]      Radiology Studies: No results found.  Scheduled Meds:  aspirin EC  81 mg Oral Daily   Chlorhexidine Gluconate Cloth  6 each Topical Daily  cholecalciferol  2,000 Units Oral Daily   docusate sodium  100 mg Oral BID   folic acid  1 mg Oral Daily   gabapentin  100 mg Oral QHS   guaiFENesin  5 mL Oral QID   insulin aspart  0-15 Units Subcutaneous TID WC   metoprolol succinate  100 mg Oral Daily   pantoprazole  40 mg Oral Daily   rosuvastatin  40 mg Oral QHS   senna  1 tablet Oral BID   Continuous Infusions:  sodium chloride Stopped (08/17/22 0012)     LOS: 7 days   Shelly Coss, MD [facility] P12/28/2023, 1:00 PM

## 2022-08-21 ENCOUNTER — Other Ambulatory Visit (HOSPITAL_COMMUNITY): Payer: Self-pay

## 2022-08-21 DIAGNOSIS — I4891 Unspecified atrial fibrillation: Secondary | ICD-10-CM | POA: Diagnosis not present

## 2022-08-21 DIAGNOSIS — R29898 Other symptoms and signs involving the musculoskeletal system: Secondary | ICD-10-CM | POA: Diagnosis not present

## 2022-08-21 LAB — CBC
HCT: 31 % — ABNORMAL LOW (ref 36.0–46.0)
Hemoglobin: 9.2 g/dL — ABNORMAL LOW (ref 12.0–15.0)
MCH: 27.8 pg (ref 26.0–34.0)
MCHC: 29.7 g/dL — ABNORMAL LOW (ref 30.0–36.0)
MCV: 93.7 fL (ref 80.0–100.0)
Platelets: 154 10*3/uL (ref 150–400)
RBC: 3.31 MIL/uL — ABNORMAL LOW (ref 3.87–5.11)
RDW: 15.3 % (ref 11.5–15.5)
WBC: 6.5 10*3/uL (ref 4.0–10.5)
nRBC: 0 % (ref 0.0–0.2)

## 2022-08-21 LAB — GLUCOSE, CAPILLARY
Glucose-Capillary: 98 mg/dL (ref 70–99)
Glucose-Capillary: 118 mg/dL — ABNORMAL HIGH (ref 70–99)
Glucose-Capillary: 138 mg/dL — ABNORMAL HIGH (ref 70–99)
Glucose-Capillary: 126 mg/dL — ABNORMAL HIGH (ref 70–99)

## 2022-08-21 LAB — BASIC METABOLIC PANEL
Anion gap: 13 (ref 5–15)
BUN: 15 mg/dL (ref 8–23)
CO2: 24 mmol/L (ref 22–32)
Calcium: 9.4 mg/dL (ref 8.9–10.3)
Chloride: 106 mmol/L (ref 98–111)
Creatinine, Ser: 0.79 mg/dL (ref 0.44–1.00)
GFR, Estimated: >60 mL/min (ref >60)
Glucose, Bld: 105 mg/dL — ABNORMAL HIGH (ref 70–99)
Potassium: 4.4 mmol/L (ref 3.5–5.1)
Sodium: 143 mmol/L (ref 135–145)

## 2022-08-21 MED ORDER — METOPROLOL TARTRATE 5 MG/5ML IV SOLN
5.0000 mg | Freq: Once | INTRAVENOUS | Status: AC
  Administered 2022-08-21: 5 mg via INTRAVENOUS
  Filled 2022-08-21: qty 5

## 2022-08-21 MED ORDER — METOPROLOL SUCCINATE ER 50 MG PO TB24
50.0000 mg | ORAL_TABLET | Freq: Every evening | ORAL | Status: DC
  Administered 2022-08-21 – 2022-08-22 (×2): 50 mg via ORAL
  Filled 2022-08-21 (×2): qty 1

## 2022-08-21 MED ORDER — SIMETHICONE 80 MG PO CHEW
80.0000 mg | CHEWABLE_TABLET | Freq: Once | ORAL | Status: AC
  Administered 2022-08-21: 80 mg via ORAL
  Filled 2022-08-21: qty 1

## 2022-08-21 MED ORDER — METOPROLOL SUCCINATE ER 100 MG PO TB24
100.0000 mg | ORAL_TABLET | Freq: Every morning | ORAL | Status: DC
  Administered 2022-08-22 – 2022-08-27 (×6): 100 mg via ORAL
  Filled 2022-08-21 (×6): qty 1

## 2022-08-21 NOTE — Progress Notes (Signed)
Rounding Note    Patient Name: Erika Gates Date of Encounter: 08/21/2022  [facility] Cardiologist: Lesleigh Noe, MD   Subjective   No complaints.   Inpatient Medications    Scheduled Meds:  apixaban  5 mg Oral BID   aspirin EC  81 mg Oral Daily   Chlorhexidine Gluconate Cloth  6 each Topical Daily   cholecalciferol  2,000 Units Oral Daily   docusate sodium  100 mg Oral BID   folic acid  1 mg Oral Daily   furosemide  20 mg Oral Daily   gabapentin  100 mg Oral QHS   guaiFENesin  5 mL Oral QID   insulin aspart  0-15 Units Subcutaneous TID WC   metoprolol succinate  100 mg Oral Daily   pantoprazole  40 mg Oral Daily   rosuvastatin  40 mg Oral QHS   senna  1 tablet Oral BID   Continuous Infusions:  sodium chloride Stopped (08/17/22 0012)   PRN Meds: acetaminophen **OR** acetaminophen, bisacodyl, clonazepam, lip balm, menthol-cetylpyridinium **OR** phenol, metoprolol tartrate, ondansetron **OR** ondansetron (ZOFRAN) IV, polyethylene glycol, polyvinyl alcohol, sodium chloride, sodium phosphate   Vital Signs    Vitals:   08/21/22 0009 08/21/22 0327 08/21/22 0407 08/21/22 0734  BP: (!) 167/73  (!) 154/87 (!) 157/92  Pulse: (!) 107  87 96  Resp: 18 18 18 14   Temp: 98.7 F (37.1 C)  98.5 F (36.9 C) 99.1 F (37.3 C)  TempSrc: Oral  Oral Oral  SpO2: 100% 100% 98% 98%  Weight:      Height:        Intake/Output Summary (Last 24 hours) at 08/21/2022 1021 Last data filed at 08/21/2022 0327 Gross per 24 hour  Intake 240 ml  Output 1900 ml  Net -1660 ml      08/18/2022    2:00 AM 08/14/2022   12:15 PM 10/28/2021   11:43 AM  Last 3 Weights  Weight (lbs) 211 lb 3.2 oz 227 lb 1.2 oz 226 lb 12.8 oz  Weight (kg) 95.8 kg 103 kg 102.876 kg      Telemetry    Atrial fib, rates 130s - Personally Reviewed  ECG    No am EKG - Personally Reviewed  Physical Exam   GEN: No acute distress.   Neck: No JVD Cardiac: Irregular, tachy. No murmurs,  rubs, or gallops.  Respiratory: Clear to auscultation bilaterally. GI: Soft MS: No LE edema Psych: Normal affect   Labs    High Sensitivity Troponin:   Recent Labs  Lab 08/10/22 1649 08/10/22 1940 08/16/22 0755  TROPONINIHS 11 11 61*     Chemistry Recent Labs  Lab 08/16/22 0755 08/17/22 0207 08/18/22 0320 08/19/22 0803 08/21/22 0258  NA 133* 142 144 144 143  K 4.6 4.3 3.1* 4.1 4.4  CL 108 111 114* 111 106  CO2 13* 23 22 26 24   GLUCOSE 111* 107* 77 107* 105*  BUN 28* 31* 14 16 15   CREATININE 0.87 0.96 0.55 0.82 0.79  CALCIUM 8.1* 10.0 8.9 9.6 9.4  MG 1.4* 2.2 1.6* 2.5*  --   PROT 3.2*  --  5.3*  --   --   ALBUMIN <1.5*  --  2.1*  --   --   AST 201*  --  76*  --   --   ALT 100*  --  69*  --   --   ALKPHOS 34*  --  62  --   --  BILITOT 0.3  --  0.6  --   --   GFRNONAA >60 58* >60 >60 >60  ANIONGAP 12 8 8 7 13     Lipids No results for input(s): "CHOL", "TRIG", "HDL", "LABVLDL", "LDLCALC", "CHOLHDL" in the last 168 hours.  Hematology Recent Labs  Lab 08/17/22 0446 08/18/22 0320 08/21/22 0258  WBC 6.3 6.7 6.5  RBC 3.48* 3.33* 3.31*  HGB 9.9* 9.2* 9.2*  HCT 32.1* 30.5* 31.0*  MCV 92.2 91.6 93.7  MCH 28.4 27.6 27.8  MCHC 30.8 30.2 29.7*  RDW 15.5 15.2 15.3  PLT 116* 130* 154   Thyroid No results for input(s): "TSH", "FREET4" in the last 168 hours.  BNPNo results for input(s): "BNP", "PROBNP" in the last 168 hours.  DDimer No results for input(s): "DDIMER" in the last 168 hours.   Radiology    No results found.  Cardiac Studies     Patient Profile     87 y.o. female with CAD s/p CABG in 1996, HTN, HLD, DM, Diastolic CHF, morbid obesity who converted to atrial fib with RVR following PEA arrest on 08/16/22. She had been admitted on 08/10/22 with neurological issues (myelopathy and quadriparesis) requiring cervical decompression. 2 days later she had a PEA arrest felt to be due to polypharmacy. New onset atrial fibrillation 08/18/22. Cardiology consulted  for atrial fib with RVR. She was seen on 08/19/22 and her rate was controlled on Toprol 100 mg daily.   Assessment & Plan    Atrial fib, persistent: We are asked to see the patient again today due to elevated heart rate. Telemetry reviewed. Heart rates 130s. She is tolerating the elevated heart rate well hemodynamically. BP is stable, actually elevated. She has been cleared to start Eliquis by the Neurosurgery team.  -I will increase her Toprol to 100 mg po am, 50 mg pm and will give one dose Lopressor 5 mg IV x 1 now.  -If rates are not controlled tomorrow, can consider amiodarone for rate control or IV digoxin.   For questions or updates, please contact [facility] Please consult [email or web address] for contact info under        Signed, 08/21/22, MD  08/21/2022, 10:21 AM

## 2022-08-21 NOTE — Progress Notes (Signed)
Speech Language Pathology Treatment: Dysphagia  Patient Details Name: Erika Gates MRN: [id number] DOB: 1935/06/11 Today's Date: 08/21/2022 Time: 7125-2712 SLP Time Calculation (min) (ACUTE ONLY): 20 min  Assessment / Plan / Recommendation Clinical Impression  Pt seen for dysphagia tx with intake of dysphagia 3 (mechanical soft)/thin liquid consistencies with vocal intensity decreased during conversation.  Pt exhibited an audible swallow with multiple swallows noted with larger boluses of thin, but these symptoms were eliminated with smaller sips.  Pt's oxygen requirement decreased since last tx session (now on 2L), but low grade fever noted during chart review, so swallowing precautions should be adhered to with FULL precautions in place.  These were reviewed with pt/[name]) including breathing/swallowing reciprocity during eating/drinking.  Discussed potential for MBS to r/o aspiration and determine if compensatory strategies assist with improving safety with PO consumption.  Continue Dysphagia 3/thin liquids with swallowing precautions in place during all PO intake.  ST will continue to f/u in acute setting for dysphagia tx.  HPI HPI: Patient is an 87 y.o. female with PMH: CAD s/p CABG, Anxiety, HTN, hyperlipidemia and morbid obesity. She presented to the hospital on 08/13/22 with progressive bilateral upper extremity and lower extremity weakness. MRI of the cervical spine demonstrates high-grade stenosis at C3-4 C4-5 and C5-6; C3-4 and 5 6 with evidence of cord compression. On 12/22 she underwent ACDF C3-6. PEA arrest occured 12/24, ROSC 7 minutes. Intubated 12/24-12/25; ST completed BSE on 12/26 recommending Dysphagia 3 (mechanical soft)/thin liquids. ST f/u for diet tolerance.      SLP Plan  Continue with current plan of care;Other (Comment) (potential for MBS prn)      Recommendations for follow up therapy are one component of a multi-disciplinary discharge planning process, led  by the attending physician.  Recommendations may be updated based on patient status, additional functional criteria and insurance authorization.    Recommendations  Diet recommendations: Dysphagia 3 (mechanical soft);Thin liquid Liquids provided via: Cup;Straw (small sips) Medication Administration: Whole meds with puree Supervision: Staff to assist with self feeding;Intermittent supervision to cue for compensatory strategies Compensations: Slow rate;Small sips/bites;Multiple dry swallows after each bite/sip;Follow solids with liquid Postural Changes and/or Swallow Maneuvers: Seated upright 90 degrees                Oral Care Recommendations: Oral care BID;Patient independent with oral care Follow Up Recommendations: Follow physician's recommendations for discharge plan and follow up therapies Assistance recommended at discharge: Frequent or constant Supervision/Assistance SLP Visit Diagnosis: Dysphagia, oropharyngeal phase (R13.12) Plan: Continue with current plan of care;Other (Comment) (potential for MBS prn)           Tressie Stalker, M.S., CCC-SLP  08/21/2022, 3:08 PM

## 2022-08-21 NOTE — Progress Notes (Signed)
Occupational Therapy Treatment Patient Details Name: Erika Gates MRN: [id number] DOB: 07/13/1935 Today's Date: 08/21/2022   History of present illness Pt is an 87 year old admitted with progressive bilateral upper extremity and lower extremity weakness. MRI of the cervical spine demonstrates high-grade stenosis at C3-4 C4-5 and C5-6; C3-4 and 5 6 with evidence of cord compression. 12/22 ACDF C3-6. PEA arrest 12/24, ROSC 7 minutes.  Intubated 12/24-12/25. PMH: CAD s/p CABG, Anxiety, HTN, hyperlipidemia and morbid obesity   OT comments  Patient received in supine and willing to participate with OT. Patient demonstrated good gains this treatment session with patient able to use RUE to perform light grooming and LUE to feed self without built up utensils. Patient was able to feed self with elbow support to allow patient to reach mouth while seated on EOB. Patient returned to supine to perform AAROM and AROM to LUE to increase functional use for self care and self feeding. Patient would benefit from further OT in AIR setting for continued therapy to address BUE use, functional transfers, and self care.    Recommendations for follow up therapy are one component of a multi-disciplinary discharge planning process, led by the attending physician.  Recommendations may be updated based on patient status, additional functional criteria and insurance authorization.    Follow Up Recommendations  Acute inpatient rehab (3hours/day)     Assistance Recommended at Discharge Frequent or constant Supervision/Assistance  Patient can return home with the following  A lot of help with walking and/or transfers;A lot of help with bathing/dressing/bathroom;Assistance with feeding;Assistance with cooking/housework;Assist for transportation;Help with stairs or ramp for entrance   Equipment Recommendations  Tub/shower bench;Wheelchair (measurements OT);Wheelchair cushion (measurements OT);Hospital bed     Recommendations for Other Services      Precautions / Restrictions Precautions Precautions: Fall;Cervical;Other (comment) Precaution Booklet Issued: No Precaution Comments: watch HR Required Braces or Orthoses:  (no brace needed) Restrictions Weight Bearing Restrictions: No Other Position/Activity Restrictions: no brace needed       Mobility Bed Mobility Overal bed mobility: Needs Assistance Bed Mobility: Rolling, Sidelying to Sit, Sit to Sidelying Rolling: Min assist Sidelying to sit: Mod assist     Sit to sidelying: Mod assist General bed mobility comments: assistance with trunk and scooting hips towards EOB    Transfers Overall transfer level: Needs assistance                 General transfer comment: not attempted with OT     Balance Overall balance assessment: Needs assistance Sitting-balance support: No upper extremity supported, Feet supported, Single extremity supported Sitting balance-Leahy Scale: Fair Sitting balance - Comments: min guard to min assist for sitting balance due to fatigue during grooming and self feeding                                   ADL either performed or assessed with clinical judgement   ADL Overall ADL's : Needs assistance/impaired Eating/Feeding: Minimal assistance;Moderate assistance;Sitting Eating/Feeding Details (indicate cue type and reason): performed seated on EOB with assitance for balance. Patient able to use utensil without built up foam but requried support at elbow to allow patient to bring spoon to mouth Grooming: Wash/dry hands;Wash/dry face;Supervision/safety;Sitting Grooming Details (indicate cue type and reason): able to perform using RUE  General ADL Comments: grooming and self feeding performed seated on EOB    Extremity/Trunk Assessment Upper Extremity Assessment RUE Deficits / Details: grossly: shoulder 2/5, elbow 3+/5 flexion and extension, wrist  3+/5, gross grasp and release. RUE Coordination: decreased fine motor;decreased gross motor LUE Deficits / Details: grossly: shoulder 1/5, elbow flexion 3+/5 and extension 3+/5, wrist flexion/extension 3+/5. LUE Coordination: decreased fine motor;decreased gross motor            Vision       Perception     Praxis      Cognition Arousal/Alertness: Awake/alert Behavior During Therapy: Flat affect Overall Cognitive Status: Impaired/Different from baseline Area of Impairment: Memory, Problem solving, Following commands                     Memory: Decreased short-term memory, Decreased recall of precautions Following Commands: Follows one step commands consistently     Problem Solving: Slow processing, Requires verbal cues, Difficulty sequencing General Comments: able to follow simple commands with increased time        Exercises Exercises: General Upper Extremity General Exercises - Upper Extremity Shoulder Flexion: AAROM, Left, 5 reps, Supine Shoulder ABduction: AAROM, Left, 10 reps, Supine Elbow Flexion: AROM, 10 reps, Supine, Both Elbow Extension: AROM, 10 reps, Supine, Both    Shoulder Instructions       General Comments VSS on supplemental O2    Pertinent Vitals/ Pain       Pain Assessment Pain Assessment: Faces Faces Pain Scale: Hurts little more Pain Location: LUE when attempting to use for self feeding Pain Descriptors / Indicators: Grimacing, Guarding Pain Intervention(s): Limited activity within patient's tolerance, Monitored during session, Repositioned  Home Living                                          Prior Functioning/Environment              Frequency  Min 2X/week        Progress Toward Goals  OT Goals(current goals can now be found in the care plan section)  Progress towards OT goals: Progressing toward goals  Acute Rehab OT Goals Patient Stated Goal: get better OT Goal Formulation: With  patient Time For Goal Achievement: 08/26/22 Potential to Achieve Goals: Good ADL Goals Pt Will Perform Eating: with min assist;with adaptive utensils;sitting Pt Will Perform Grooming: with min assist;sitting;with adaptive equipment Pt Will Perform Upper Body Bathing: with min assist;standing;with adaptive equipment Pt Will Transfer to Toilet: with mod assist;stand pivot transfer Pt/caregiver will Perform Home Exercise Program: Increased strength;Increased ROM;Both right and left upper extremity;With written HEP provided;With theraputty  Plan Discharge plan remains appropriate;Frequency remains appropriate    Co-evaluation                 AM-PAC OT "6 Clicks" Daily Activity     Outcome Measure   Help from another person eating meals?: A Little Help from another person taking care of personal grooming?: A Little Help from another person toileting, which includes using toliet, bedpan, or urinal?: Total Help from another person bathing (including washing, rinsing, drying)?: A Lot Help from another person to put on and taking off regular upper body clothing?: A Lot Help from another person to put on and taking off regular lower body clothing?: Total 6 Click Score: 12    End of Session    OT Visit Diagnosis:  Unsteadiness on feet (R26.81);Other abnormalities of gait and mobility (R26.89);Muscle weakness (generalized) (M62.81);Pain Pain - Right/Left: Left Pain - part of body: Arm   Activity Tolerance Patient tolerated treatment well   Patient Left in bed;with call bell/phone within reach;with family/visitor present   Nurse Communication Mobility status        Time: 8127-5170 OT Time Calculation (min): 21 min  Charges: OT General Charges $OT Visit: 1 Visit OT Treatments $Self Care/Home Management : 8-22 mins  Alfonse Flavors, OTA Acute Rehabilitation Services  Office [phone]   Dewain Penning 08/21/2022, 12:54 PM

## 2022-08-21 NOTE — Progress Notes (Signed)
Patient ID: Erika Gates, female   DOB: 05-04-1935, 87 y.o.   MRN: [id number] Events over the [place] holiday noted.  I was contacted regarding the question of anticoagulation.  I believe the patient can undergo full anticoagulation as needed.

## 2022-08-21 NOTE — Progress Notes (Signed)
Physical Therapy Treatment Patient Details Name: Erika Gates MRN: [id number] DOB: [birth year] Today's Date: 08/21/2022   History of Present Illness Pt is an 87 year old admitted with progressive bilateral upper extremity and lower extremity weakness. MRI of the cervical spine demonstrates high-grade stenosis at C3-4 C4-5 and C5-6; C3-4 and 5 6 with evidence of cord compression. 12/22 ACDF C3-6. PEA arrest 12/24, ROSC 7 minutes.  Intubated 12/24-12/25. PMH: CAD s/p CABG, Anxiety, HTN, hyperlipidemia and morbid obesity    PT Comments    Pt greeted semi-reclined in bed and agreeable to session with continued progress towards acute goals. Pt with improved initiation of bed mobility with pt able to bring BLEs to and off EOB and requiring mod assist to elevate trunk into sitting. Pt able to maintain static sitting without assist for ~10-12 mins and perform warm up LE therex. Pt able to power up to standing with RW and mod assist x3 trials and maintain ~15 seconds each trial. Pt unable to side step to [facility] secondary to R knee pain in standing, however with mod assist pt able to laterally scoot along EOB to HOB. Pt daughter present and supportive throughout session. Pt continues to be motivated to return to PLOF and current plan remains appropriate to address deficits and maximize functional independence and decrease caregiver burden. Pt continues to benefit from skilled PT services to progress toward functional mobility goals.    Recommendations for follow up therapy are one component of a multi-disciplinary discharge planning process, led by the attending physician.  Recommendations may be updated based on patient status, additional functional criteria and insurance authorization.  Follow Up Recommendations  Acute inpatient rehab (3hours/day)     Assistance Recommended at Discharge Frequent or constant Supervision/Assistance  Patient can return home with the following Two people to help with walking  and/or transfers;A lot of help with bathing/dressing/bathroom;Assistance with cooking/housework;Assist for transportation;Help with stairs or ramp for entrance   Equipment Recommendations  Rolling walker (2 wheels)    Recommendations for Other Services Rehab consult     Precautions / Restrictions Precautions Precautions: Fall;Cervical;Other (comment) Precaution Booklet Issued: No Precaution Comments: watch HR Required Braces or Orthoses:  (no brace needed) Restrictions Weight Bearing Restrictions: No Other Position/Activity Restrictions: no brace needed     Mobility  Bed Mobility Overal bed mobility: Needs Assistance Bed Mobility: Rolling, Sidelying to Sit, Sit to Sidelying Rolling: Min assist Sidelying to sit: Mod assist     Sit to sidelying: Mod assist General bed mobility comments: Good initiation from pt. assist to elevta trunk and scoot out to EOB    Transfers Overall transfer level: Needs assistance Equipment used: Rolling walker (2 wheels) Transfers: Sit to/from Stand, Bed to chair/wheelchair/BSC Sit to Stand: Mod assist          Lateral/Scoot Transfers: Mod assist General transfer comment: mod a to power up to stand x3 trials, cues to tuck hips and elevate chest as pt with tendency for anterior flexed posture, pt limited by fatigue, mod asssit to laterally scoot along EOB to [facility]    Ambulation/Gait               General Gait Details: unable   Stairs             Wheelchair Mobility    Modified Rankin (Stroke Patients Only)       Balance Overall balance assessment: Needs assistance Sitting-balance support: No upper extremity supported, Feet supported, Single extremity supported Sitting balance-Leahy Scale: Fair Sitting balance - Comments: Pt  sat EOB x 15 minutes min guard assist   Standing balance support: Bilateral upper extremity supported, During functional activity Standing balance-Leahy Scale: Poor Standing balance comment: heavy  reliance of BUE on RW, able to stand ~15 seconds each trial                            Cognition Arousal/Alertness: Awake/alert Behavior During Therapy: Flat affect Overall Cognitive Status: Impaired/Different from baseline Area of Impairment: Memory, Problem solving, Following commands                     Memory: Decreased short-term memory, Decreased recall of precautions Following Commands: Follows one step commands consistently     Problem Solving: Slow processing, Requires verbal cues, Difficulty sequencing General Comments: follow simple commands with increased time, good initation but difficulty sequencing.        Exercises Other Exercises Other Exercises: warm up, LAQ, hip lexion, heel/toe raises all in sitting Other Exercises: SAQ x 10    General Comments General comments (skin integrity, edema, etc.): VSS on supplemental O2      Pertinent Vitals/Pain Pain Assessment Pain Assessment: Faces Faces Pain Scale: Hurts little more Pain Location: R knee in standing Pain Descriptors / Indicators: Grimacing, Sore Pain Intervention(s): Limited activity within patient's tolerance, Monitored during session, Repositioned    Home Living                          Prior Function            PT Goals (current goals can now be found in the care plan section) Acute Rehab PT Goals Patient Stated Goal: to walk PT Goal Formulation: With patient Time For Goal Achievement: 08/29/22 Progress towards PT goals: Progressing toward goals    Frequency    Min 3X/week      PT Plan Current plan remains appropriate    Co-evaluation              AM-PAC PT "6 Clicks" Mobility   Outcome Measure  Help needed turning from your back to your side while in a flat bed without using bedrails?: A Lot Help needed moving from lying on your back to sitting on the side of a flat bed without using bedrails?: A Lot Help needed moving to and from a bed to a  chair (including a wheelchair)?: Total Help needed standing up from a chair using your arms (e.g., wheelchair or bedside chair)?: A Lot Help needed to walk in hospital room?: Total Help needed climbing 3-5 steps with a railing? : Total 6 Click Score: 9    End of Session   Activity Tolerance: Patient tolerated treatment well Patient left: in bed;with call bell/phone within reach;with bed alarm set Nurse Communication: Mobility status PT Visit Diagnosis: Unsteadiness on feet (R26.81);Pain;Muscle weakness (generalized) (M62.81);Other symptoms and signs involving the nervous system (R29.898) Pain - part of body: Shoulder;Knee     Time: 1140-1201 PT Time Calculation (min) (ACUTE ONLY): 21 min  Charges:  $Therapeutic Activity: 8-22 mins                     Christino Mcglinchey R. PTA Acute Rehabilitation Services Office: [place] 08/21/2022, 12:13 PM

## 2022-08-21 NOTE — TOC Progression Note (Signed)
Transition of Care [facility]) - Progression Note    Patient Details  Name: SOHANA AUSTELL MRN: [id number] Date of Birth: 19-Mar-1935  [facility] CM/SW Contact  Kermit Balo, RN Phone Number: 08/21/2022, 4:42 PM  Clinical Narrative:    [facility] requested more information. CM has sent them new therapy and MD notes.    Expected Discharge Plan: Skilled Nursing Facility Barriers to Discharge: Continued Medical Work up, SNF Pending bed offer  Expected Discharge Plan and Services In-house Referral: Clinical Social Work Discharge Planning Services: CM Consult Post Acute Care Choice: Skilled Nursing Facility Living arrangements for the past 2 months: Single Family Home                                       Social Determinants of Health (SDOH) Interventions SDOH Screenings   Food Insecurity: No Food Insecurity (08/11/2022)  Housing: Low Risk  (08/11/2022)  Transportation Needs: No Transportation Needs (08/11/2022)  Utilities: Not At Risk (08/11/2022)  Tobacco Use: Low Risk  (08/17/2022)    Readmission Risk Interventions     No data to display

## 2022-08-21 NOTE — TOC Benefit Eligibility Note (Signed)
Patient Advocate Encounter  Insurance verification completed.    The patient is currently admitted and upon discharge could be taking Eliquis 5 mg.  The current 30 day co-pay is $40.00.   The patient is insured through [employer] Part D   Markeith Jue, CPHT Pharmacy Patient Advocate Specialist [facility] Patient Advocate Team Direct Number: [phone]  Fax: [phone]       

## 2022-08-21 NOTE — Progress Notes (Addendum)
Inpatient Rehab Admissions Coordinator:    Addendum: PT and OT saw patient today and is appears that patient is continuing to require extensive assist for minimal mobility/activity. Will not pursue CIR at this time. Patient needs SNF placement. Signing off.      Continuing to follow for potential CIR admission, however patient has only had 1 therapy session since 12/26 and is unable to stand or fully clear bottom. It is unlikely that patient will be able to tolerate CIR level of rehab or progress with mobility quickly enough to be able to return home with family assist. Will look for PT note today to see if patient is able to make any progress.      Rehab Admissons Coordinator [place], [place], [place] [phone]

## 2022-08-21 NOTE — Progress Notes (Signed)
PROGRESS NOTE  Erika Gates  IHK:[id number] DOB: March 07, 1935 DOA: 08/10/2022 PCP: [name], MD   Brief Narrative: Patient is a 87 year old female with past medical history of coronary disease status post CABG, hypertension, hyperlipidemia, obesity who presented with progressive bilateral upper extremities, lower extremity weakness.  She was found to have a spondylolysis with myelopathy, quadriparesis.  Neurosurgery consulted and she underwent anterior cervical decompression, arthrodesis on  with structural spacer and allograft anterior plate fixation V5-I4 on 12/22.she had sudden onset of altered mental status and went into PEA arrest on 12/24.  Had 7 minutes of resuscitation finally achieving ROSC.  Transferred to ICU for further management and kept on vent.  Patient transferred to [facility] service on 12/27.  Hospital course remarkable for A-fib with RVR.  PT recommending acute inpatient rehab on discharge  Assessment & Plan:  Principal Problem:   Upper extremity weakness Active Problems:   MORBID OBESITY   Essential hypertension   History of coronary artery disease s/p CABG    Weakness of left lower extremity   Anxiety   Cervical stenosis of spinal canal   Cardiac arrest: Suspected to be secondary to polypharmacy induced respiratory depression.  Currently alert and oriented.  Extubated. On 2 L of oxygen currently.  On home she is on 1 to 2 L as needed at night.  A-fib with RVR: New problem.CHADS2vasc score 6 .  Cardiology consulted, commended to continue metoprolol.   Cardiology recommended to start on anticoagulation.Started on eliquis. Remains in A-fib with RVR today, cardiology reconsulted.  Status post cervical spine decompression: Neurosurgery were following.  History of cervical spinal stenosis, bilateral upper extremity weakness.  Status post decompression surgery on 12/22.  PT/OT recommending acute inpatient rehab  History of coronary artery disease: Status post CABG.  No  anginal symptoms.  On metoprolol, statin, aspirin  History of hypertension: On metoprolol, amlodipine, lisinopril at home.  AKI: Resolved  Diabetes: Continue sliding scale insulin  Thrombocytopenia: Stable.  Morbid obesity: BMI 39.9    Pressure Injury 08/16/22 Buttocks Medial Deep Tissue Pressure Injury - Purple or maroon localized area of discolored intact skin or blood-filled blister due to damage of underlying soft tissue from pressure and/or shear. (Active)  08/16/22 2000  Location: Buttocks  Location Orientation: Medial  Staging: Deep Tissue Pressure Injury - Purple or maroon localized area of discolored intact skin or blood-filled blister due to damage of underlying soft tissue from pressure and/or shear.  Wound Description (Comments):   Present on Admission:   Dressing Type Foam - Lift dressing to assess site every shift 08/21/22 0754    DVT prophylaxis:SCD's Start: 08/14/22 1315 Place and maintain sequential compression device Start: 08/13/22 1027 apixaban (ELIQUIS) tablet 5 mg     Code Status: Full Code  Family Communication: Grandson  at bedside,daughter at bedside  Patient status:Inpatient  Patient is from :Home  Anticipated discharge to:AIR  Estimated DC date:1-2 days   Consultants: Neurosurgery, PCCM, cardiology  Procedures: Intubation  Antimicrobials:  Anti-infectives (From admission, onward)    Start     Dose/Rate Route Frequency Ordered Stop   08/14/22 1800  ceFAZolin (ANCEF) IVPB 2g/100 mL premix        2 g 200 mL/hr over 30 Minutes Intravenous Every 8 hours 08/14/22 1314 08/15/22 1147   08/14/22 0733  ceFAZolin (ANCEF) 2-4 GM/100ML-% IVPB       Note to Pharmacy: Cyndra Numbers (: cabinet override      08/14/22 3329 08/14/22 1944  Subjective: Patient seen and examined at bedside today.  Hemodynamically stable.  Her heart rate remains fast, irregular.  Telemetry showing 1 30-1 48.  She denies any chest pain, shortness of breath or  lightheadedness today.  Comfortable on 2 L of oxygen.    Objective: Vitals:   08/21/22 0327 08/21/22 0407 08/21/22 0734 08/21/22 1141  BP:  (!) 154/87 (!) 157/92 (!) 144/86  Pulse:  87 96 96  Resp: 18 18 14 20   Temp:  98.5 F (36.9 C) 99.1 F (37.3 C) 99.5 F (37.5 C)  TempSrc:  Oral Oral Oral  SpO2: 100% 98% 98%   Weight:      Height:        Intake/Output Summary (Last 24 hours) at 08/21/2022 1257 Last data filed at 08/21/2022 1000 Gross per 24 hour  Intake 560 ml  Output 2300 ml  Net -1740 ml   Filed Weights   08/14/22 1215 08/18/22 0200  Weight: 103 kg 95.8 kg    Examination:  General exam: Obese, overall comfortable, lying in bed HEENT: PERRL Respiratory system: Diminished air sounds at bases Cardiovascular system: Irregularly irregular rhythm Gastrointestinal system: Abdomen is nondistended, soft and nontender. Central nervous system: Alert and oriented Extremities: No edema, no clubbing ,no cyanosis Skin: No rashes, no ulcers,no icterus    Data Reviewed: I have personally reviewed following labs and imaging studies  CBC: Recent Labs  Lab 08/15/22 0234 08/16/22 0245 08/16/22 1055 08/17/22 0446 08/18/22 0320 08/21/22 0258  WBC 9.6 8.4 8.5 6.3 6.7 6.5  NEUTROABS 8.2*  --  7.0  --  5.5  --   HGB 10.3* 9.9* 8.6* 9.9* 9.2* 9.2*  HCT 32.7* 32.8* 28.5* 32.1* 30.5* 31.0*  MCV 92.1 93.7 94.1 92.2 91.6 93.7  PLT 120* 117* PLATELET CLUMPS NOTED ON SMEAR, COUNT APPEARS DECREASED 116* 130* [id number]   Basic Metabolic Panel: Recent Labs  Lab 08/15/22 0234 08/16/22 0245 08/16/22 0755 08/17/22 0207 08/18/22 0320 08/19/22 0803 08/21/22 0258  NA 135   < > 133* 142 144 144 143  K 5.3*   < > 4.6 4.3 3.1* 4.1 4.4  CL 103   < > 108 111 114* 111 106  CO2 26   < > 13* 23 22 26 24   GLUCOSE 178*   < > 111* 107* 77 107* 105*  BUN 30*   < > 28* 31* 14 16 15   CREATININE 1.08*   < > 0.87 0.96 0.55 0.82 0.79  CALCIUM 9.4   < > 8.1* 10.0 8.9 9.6 9.4  MG 2.3  --  1.4* 2.2  1.6* 2.5*  --   PHOS  --   --   --  2.5 1.7* 2.5  --    < > = values in this interval not displayed.     Recent Results (from the past 240 hour(s))  MRSA Next Gen by PCR, Nasal     Status: None   Collection Time: 08/16/22  9:35 AM   Specimen: Nasal Mucosa; Nasal Swab  Result Value Ref Range Status   MRSA by PCR Next Gen NOT DETECTED NOT DETECTED Final    Comment: (NOTE) The GeneXpert MRSA Assay (FDA approved for NASAL specimens only), is one component of a comprehensive MRSA colonization surveillance program. It is not intended to diagnose MRSA infection nor to guide or monitor treatment for MRSA infections. Test performance is not FDA approved in patients less than 30 years old. Performed at [facility], [place] [address], [place], [place]  [place]      Radiology Studies: No results found.  Scheduled Meds:  apixaban  5 mg Oral BID   aspirin EC  81 mg Oral Daily   Chlorhexidine Gluconate Cloth  6 each Topical Daily   cholecalciferol  2,000 Units Oral Daily   docusate sodium  100 mg Oral BID   folic acid  1 mg Oral Daily   furosemide  20 mg Oral Daily   gabapentin  100 mg Oral QHS   guaiFENesin  5 mL Oral QID   insulin aspart  0-15 Units Subcutaneous TID WC   [START ON 08/22/2022] metoprolol succinate  100 mg Oral q morning   metoprolol succinate  50 mg Oral QPM   metoprolol tartrate  5 mg Intravenous Once   pantoprazole  40 mg Oral Daily   rosuvastatin  40 mg Oral QHS   senna  1 tablet Oral BID   Continuous Infusions:  sodium chloride Stopped (08/17/22 0012)     LOS: 8 days   Shelly Coss, MD [facility] P12/29/2023, 12:57 PM

## 2022-08-21 NOTE — Care Management Important Message (Signed)
Important Message  Patient Details  Name: Erika Gates MRN: [id number] Date of Birth: 03-Feb-1935   Medicare Important Message Given:  Yes     [name] 08/21/2022, 9:21 AM

## 2022-08-22 DIAGNOSIS — R29898 Other symptoms and signs involving the musculoskeletal system: Secondary | ICD-10-CM | POA: Diagnosis not present

## 2022-08-22 LAB — GLUCOSE, CAPILLARY
Glucose-Capillary: 106 mg/dL — ABNORMAL HIGH (ref 70–99)
Glucose-Capillary: 114 mg/dL — ABNORMAL HIGH (ref 70–99)
Glucose-Capillary: 134 mg/dL — ABNORMAL HIGH (ref 70–99)
Glucose-Capillary: 133 mg/dL — ABNORMAL HIGH (ref 70–99)

## 2022-08-22 MED ORDER — DAPAGLIFLOZIN PROPANEDIOL 10 MG PO TABS
10.0000 mg | ORAL_TABLET | Freq: Every day | ORAL | Status: DC
  Administered 2022-08-23 – 2022-08-27 (×5): 10 mg via ORAL
  Filled 2022-08-22 (×7): qty 1

## 2022-08-22 MED ORDER — AMLODIPINE BESYLATE 5 MG PO TABS
5.0000 mg | ORAL_TABLET | Freq: Every day | ORAL | Status: DC
  Administered 2022-08-22 – 2022-08-24 (×3): 5 mg via ORAL
  Filled 2022-08-22 (×3): qty 1

## 2022-08-22 MED ORDER — LISINOPRIL 20 MG PO TABS
20.0000 mg | ORAL_TABLET | Freq: Every day | ORAL | Status: DC
  Administered 2022-08-22 – 2022-08-27 (×6): 20 mg via ORAL
  Filled 2022-08-22 (×6): qty 1

## 2022-08-22 NOTE — Plan of Care (Signed)
Heart rates well controlled on current regimen. No changes.

## 2022-08-22 NOTE — Progress Notes (Signed)
PROGRESS NOTE  Erika Gates  JYN:[id number] DOB: [birth year] DOA: 08/10/2022 PCP: [name], MD   Brief Narrative: Patient is a 87 year old female with past medical history of coronary disease status post CABG, hypertension, hyperlipidemia, obesity who presented with progressive bilateral upper extremities, lower extremity weakness.  She was found to have a spondylolysis with myelopathy, quadriparesis.  Neurosurgery consulted and she underwent anterior cervical decompression, arthrodesis on  with structural spacer and allograft anterior plate fixation Q6-V7 on 12/22.she had sudden onset of altered mental status and went into PEA arrest on 12/24.  Had 7 minutes of resuscitation finally achieving ROSC.  Transferred to ICU for further management and kept on vent.  Patient transferred to [facility] service on 12/27.  Hospital course remarkable for A-fib with RVR.  PT recommending acute inpatient rehab on discharge  Assessment & Plan:  Principal Problem:   Upper extremity weakness Active Problems:   MORBID OBESITY   Essential hypertension   History of coronary artery disease s/p CABG    Weakness of left lower extremity   Anxiety   Cervical stenosis of spinal canal   Cardiac arrest: Suspected to be secondary to polypharmacy induced respiratory depression.  Currently alert and oriented.  Extubated. On 2 L of oxygen currently.  On home she is on 1 to 2 L as needed at night.  A-fib with RVR: New problem.CHADS2vasc score 6 .  Cardiology consulted, commended to continue metoprolol.   Cardiology recommended to start on anticoagulation.Started on eliquis.HR remains on 100-115 this morning.  Status post cervical spine decompression: Neurosurgery were following.  History of cervical spinal stenosis, bilateral upper extremity weakness.  Status post decompression surgery on 12/22.  PT/OT recommending acute inpatient rehab  History of coronary artery disease: Status post CABG.  No anginal symptoms.  On  metoprolol, statin, aspirin  History of hypertension: On metoprolol, amlodipine, lisinopril at home.Restarted  AKI: Resolved  Diabetes: Continue sliding scale insulin  Thrombocytopenia: Stable.  Morbid obesity: BMI 39.9    Pressure Injury 08/16/22 Buttocks Medial Deep Tissue Pressure Injury - Purple or maroon localized area of discolored intact skin or blood-filled blister due to damage of underlying soft tissue from pressure and/or shear. (Active)  08/16/22 2000  Location: Buttocks  Location Orientation: Medial  Staging: Deep Tissue Pressure Injury - Purple or maroon localized area of discolored intact skin or blood-filled blister due to damage of underlying soft tissue from pressure and/or shear.  Wound Description (Comments):   Present on Admission:   Dressing Type Foam - Lift dressing to assess site every shift 08/22/22 0939    DVT prophylaxis:SCD's Start: 08/14/22 1315 Place and maintain sequential compression device Start: 08/13/22 1027 apixaban (ELIQUIS) tablet 5 mg     Code Status: Full Code  Family Communication: daughter at bedside  Patient status:Inpatient  Patient is from :Home  Anticipated discharge to:AIR  Estimated DC date:whenever bed is available   Consultants: Neurosurgery, PCCM, cardiology  Procedures: Intubation  Antimicrobials:  Anti-infectives (From admission, onward)    Start     Dose/Rate Route Frequency Ordered Stop   08/14/22 1800  ceFAZolin (ANCEF) IVPB 2g/100 mL premix        2 g 200 mL/hr over 30 Minutes Intravenous Every 8 hours 08/14/22 1314 08/15/22 1147   08/14/22 0733  ceFAZolin (ANCEF) 2-4 GM/100ML-% IVPB       Note to Pharmacy: Cyndra Numbers (: cabinet override      08/14/22 8469 08/14/22 1944       Subjective: Patient seen and  examined the bedside today.  Comfortable lying in bed.  EKG monitor showed heart rate of 115/min.  Daughter at the bedside.  Daughter concerned about the high blood pressure.  Home medications  including amlodipine, lisinopril resumed.  Denies shortness of breath or cough or palpitations.  Objective: Vitals:   08/22/22 0001 08/22/22 0115 08/22/22 0350 08/22/22 0812  BP: (!) 151/91  134/83 (!) 152/75  Pulse: (!) 118 81 (!) 107 (!) 109  Resp: 20   17  Temp: 99.1 F (37.3 C)  99 F (37.2 C) 99.1 F (37.3 C)  TempSrc: Oral  Oral Oral  SpO2: 94% 94% 100% 97%  Weight:      Height:        Intake/Output Summary (Last 24 hours) at 08/22/2022 1130 Last data filed at 08/22/2022 [id number] Gross per 24 hour  Intake --  Output 1400 ml  Net -1400 ml   Filed Weights   08/14/22 1215 08/18/22 0200  Weight: 103 kg 95.8 kg    Examination:   General exam: Overall comfortable, not in distress,lying on bed HEENT: PERRL Respiratory system:  no wheezes or crackles  Cardiovascular system: Irregular regular rhythm Gastrointestinal system: Abdomen is nondistended, soft and nontender. Central nervous system: Alert and oriented Extremities: No edema, no clubbing ,no cyanosis Skin: No rashes, no ulcers,no icterus    Data Reviewed: I have personally reviewed following labs and imaging studies  CBC: Recent Labs  Lab 08/16/22 0245 08/16/22 1055 08/17/22 0446 08/18/22 0320 08/21/22 0258  WBC 8.4 8.5 6.3 6.7 6.5  NEUTROABS  --  7.0  --  5.5  --   HGB 9.9* 8.6* 9.9* 9.2* 9.2*  HCT 32.8* 28.5* 32.1* 30.5* 31.0*  MCV 93.7 94.1 92.2 91.6 93.7  PLT 117* PLATELET CLUMPS NOTED ON SMEAR, COUNT APPEARS DECREASED 116* 130* 123456   Basic Metabolic Panel: Recent Labs  Lab 08/16/22 0755 08/17/22 0207 08/18/22 0320 08/19/22 0803 08/21/22 0258  NA 133* 142 144 144 143  K 4.6 4.3 3.1* 4.1 4.4  CL 108 111 114* 111 106  CO2 13* 23 22 26 24   GLUCOSE 111* 107* 77 107* 105*  BUN 28* 31* 14 16 15   CREATININE 0.87 0.96 0.55 0.82 0.79  CALCIUM 8.1* 10.0 8.9 9.6 9.4  MG 1.4* 2.2 1.6* 2.5*  --   PHOS  --  2.5 1.7* 2.5  --      Recent Results (from the past 240 hour(s))  MRSA Next Gen by PCR,  Nasal     Status: None   Collection Time: 08/16/22  9:35 AM   Specimen: Nasal Mucosa; Nasal Swab  Result Value Ref Range Status   MRSA by PCR Next Gen NOT DETECTED NOT DETECTED Final    Comment: (NOTE) The GeneXpert MRSA Assay (FDA approved for NASAL specimens only), is one component of a comprehensive MRSA colonization surveillance program. It is not intended to diagnose MRSA infection nor to guide or monitor treatment for MRSA infections. Test performance is not FDA approved in patients less than 21 years old. Performed at [facility], [place] [address], [place], [place] [address]      Radiology Studies: No results found.  Scheduled Meds:  apixaban  5 mg Oral BID   aspirin EC  81 mg Oral Daily   Chlorhexidine Gluconate Cloth  6 each Topical Daily   cholecalciferol  2,000 Units Oral Daily   docusate sodium  100 mg Oral BID   folic acid  1 mg Oral Daily  furosemide  20 mg Oral Daily   gabapentin  100 mg Oral QHS   guaiFENesin  5 mL Oral QID   insulin aspart  0-15 Units Subcutaneous TID WC   metoprolol succinate  100 mg Oral q morning   metoprolol succinate  50 mg Oral QPM   pantoprazole  40 mg Oral Daily   rosuvastatin  40 mg Oral QHS   senna  1 tablet Oral BID   Continuous Infusions:  sodium chloride Stopped (08/17/22 0012)     LOS: 9 days   Shelly Coss, MD [facility] P12/30/2023, 11:30 AM

## 2022-08-23 DIAGNOSIS — R29898 Other symptoms and signs involving the musculoskeletal system: Secondary | ICD-10-CM | POA: Diagnosis not present

## 2022-08-23 LAB — GLUCOSE, CAPILLARY
Glucose-Capillary: 109 mg/dL — ABNORMAL HIGH (ref 70–99)
Glucose-Capillary: 146 mg/dL — ABNORMAL HIGH (ref 70–99)
Glucose-Capillary: 106 mg/dL — ABNORMAL HIGH (ref 70–99)
Glucose-Capillary: 124 mg/dL — ABNORMAL HIGH (ref 70–99)

## 2022-08-23 LAB — BASIC METABOLIC PANEL
Anion gap: 11 (ref 5–15)
BUN: 17 mg/dL (ref 8–23)
CO2: 28 mmol/L (ref 22–32)
Calcium: 9.4 mg/dL (ref 8.9–10.3)
Chloride: 102 mmol/L (ref 98–111)
Creatinine, Ser: 0.86 mg/dL (ref 0.44–1.00)
GFR, Estimated: >60 mL/min (ref >60)
Glucose, Bld: 113 mg/dL — ABNORMAL HIGH (ref 70–99)
Potassium: 3.1 mmol/L — ABNORMAL LOW (ref 3.5–5.1)
Sodium: 141 mmol/L (ref 135–145)

## 2022-08-23 LAB — CBC
HCT: 31.8 % — ABNORMAL LOW (ref 36.0–46.0)
Hemoglobin: 9.6 g/dL — ABNORMAL LOW (ref 12.0–15.0)
MCH: 27.6 pg (ref 26.0–34.0)
MCHC: 30.2 g/dL (ref 30.0–36.0)
MCV: 91.4 fL (ref 80.0–100.0)
Platelets: 191 10*3/uL (ref 150–400)
RBC: 3.48 MIL/uL — ABNORMAL LOW (ref 3.87–5.11)
RDW: 15.2 % (ref 11.5–15.5)
WBC: 7 10*3/uL (ref 4.0–10.5)
nRBC: 0 % (ref 0.0–0.2)

## 2022-08-23 MED ORDER — POTASSIUM CHLORIDE CRYS ER 20 MEQ PO TBCR
40.0000 meq | EXTENDED_RELEASE_TABLET | Freq: Two times a day (BID) | ORAL | Status: AC
  Administered 2022-08-23 (×2): 40 meq via ORAL
  Filled 2022-08-23 (×2): qty 2

## 2022-08-23 MED ORDER — METOPROLOL SUCCINATE ER 100 MG PO TB24
100.0000 mg | ORAL_TABLET | Freq: Every evening | ORAL | Status: DC
  Administered 2022-08-23 – 2022-08-26 (×4): 100 mg via ORAL
  Filled 2022-08-23 (×4): qty 1

## 2022-08-23 NOTE — Progress Notes (Addendum)
PROGRESS NOTE  Erika Gates  YCX:[id number] DOB: 1935-08-18 DOA: 08/10/2022 PCP: [name], MD   Brief Narrative: Patient is a 87 year old female with past medical history of coronary disease status post CABG, hypertension, hyperlipidemia, obesity who presented with progressive bilateral upper extremities, lower extremity weakness.  She was found to have a spondylolysis with myelopathy, quadriparesis.  Neurosurgery consulted and she underwent anterior cervical decompression, arthrodesis on  with structural spacer and allograft anterior plate fixation S9-F0 on 12/22.she had sudden onset of altered mental status and went into PEA arrest on 12/24.  Had 7 minutes of resuscitation finally achieving ROSC.  Transferred to ICU for further management and kept on vent.  Patient transferred to [facility] service on 12/27.  Hospital course remarkable for A-fib with RVR.  PT recommending acute inpatient rehab on discharge  Assessment & Plan:  Principal Problem:   Upper extremity weakness Active Problems:   MORBID OBESITY   Essential hypertension   History of coronary artery disease s/p CABG    Weakness of left lower extremity   Anxiety   Cervical stenosis of spinal canal   Cardiac arrest: Suspected to be secondary to polypharmacy induced respiratory depression.  Currently alert and oriented.  Extubated. On 2 L of oxygen currently.  On home she is on 1 to 2 L as needed at night.  A-fib with RVR: New problem.CHADS2vasc score 6 .  Cardiology was consulted,  continue metoprolol.   Cardiology recommended to start on anticoagulation.Started on eliquis.  Heart rate is stable today.  Status post cervical spine decompression: Neurosurgery were following.  History of cervical spinal stenosis, bilateral upper extremity weakness.  Status post decompression surgery on 12/22.  PT/OT recommending acute inpatient rehab  History of coronary artery disease: Status post CABG.  No anginal symptoms.  On metoprolol, statin,  aspirin  History of hypertension: On metoprolol, amlodipine, lisinopril at home.Restarted  AKI: Resolved  Hypokalemia: Supplemented with potassium  Diabetes: Continue sliding scale insulin  Thrombocytopenia: Stable.  Morbid obesity: BMI 39.9    Pressure Injury 08/16/22 Buttocks Medial Deep Tissue Pressure Injury - Purple or maroon localized area of discolored intact skin or blood-filled blister due to damage of underlying soft tissue from pressure and/or shear. (Active)  08/16/22 2000  Location: Buttocks  Location Orientation: Medial  Staging: Deep Tissue Pressure Injury - Purple or maroon localized area of discolored intact skin or blood-filled blister due to damage of underlying soft tissue from pressure and/or shear.  Wound Description (Comments):   Present on Admission:   Dressing Type Foam - Lift dressing to assess site every shift 08/23/22 0815    DVT prophylaxis:SCD's Start: 08/14/22 1315 Place and maintain sequential compression device Start: 08/13/22 1027 apixaban (ELIQUIS) tablet 5 mg     Code Status: Full Code  Family Communication: daughter at bedside  Patient status:Inpatient  Patient is from :Home  Anticipated discharge to:AIR  Estimated DC date:whenever bed is available   Consultants: Neurosurgery, PCCM, cardiology  Procedures: Intubation  Antimicrobials:  Anti-infectives (From admission, onward)    Start     Dose/Rate Route Frequency Ordered Stop   08/14/22 1800  ceFAZolin (ANCEF) IVPB 2g/100 mL premix        2 g 200 mL/hr over 30 Minutes Intravenous Every 8 hours 08/14/22 1314 08/15/22 1147   08/14/22 0733  ceFAZolin (ANCEF) 2-4 GM/100ML-% IVPB       Note to Pharmacy: Cyndra Numbers (: cabinet override      08/14/22 2637 08/14/22 1944  Subjective: Patient seen and examined at bedside today.  Hemodynamically stable.  Lying in bed.  Comfortable.  No new complaints.  EKG monitor shows better heart rate today in the range of  80-90  Objective: Vitals:   08/22/22 2334 08/23/22 0315 08/23/22 0816 08/23/22 1147  BP: (!) 144/79 (!) 141/85 (!) 150/70 105/80  Pulse: 90 (!) 110 65 86  Resp: 16 18  17   Temp: 99.3 F (37.4 C) 98.9 F (37.2 C) 98.7 F (37.1 C) 98.8 F (37.1 C)  TempSrc: Oral Oral Oral Oral  SpO2: 94% 92% 91% 94%  Weight:      Height:        Intake/Output Summary (Last 24 hours) at 08/23/2022 1452 Last data filed at 08/23/2022 0001 Gross per 24 hour  Intake 200 ml  Output 1100 ml  Net -900 ml   Filed Weights   08/14/22 1215 08/18/22 0200  Weight: 103 kg 95.8 kg    Examination:   General exam: Overall comfortable, not in distress, obese HEENT: PERRL Respiratory system:  no wheezes or crackles  Cardiovascular system: Irregular regular rhythm Gastrointestinal system: Abdomen is nondistended, soft and nontender. Central nervous system: Alert and oriented Extremities: No edema, no clubbing ,no cyanosis Skin: No rashes, no ulcers,no icterus       Data Reviewed: I have personally reviewed following labs and imaging studies  CBC: Recent Labs  Lab 08/17/22 0446 08/18/22 0320 08/21/22 0258 08/23/22 0232  WBC 6.3 6.7 6.5 7.0  NEUTROABS  --  5.5  --   --   HGB 9.9* 9.2* 9.2* 9.6*  HCT 32.1* 30.5* 31.0* 31.8*  MCV 92.2 91.6 93.7 91.4  PLT 116* 130* 154 [id number]   Basic Metabolic Panel: Recent Labs  Lab 08/17/22 0207 08/18/22 0320 08/19/22 0803 08/21/22 0258 08/23/22 0232  NA 142 144 144 143 141  K 4.3 3.1* 4.1 4.4 3.1*  CL 111 114* 111 106 102  CO2 23 22 26 24 28   GLUCOSE 107* 77 107* 105* 113*  BUN 31* 14 16 15 17   CREATININE 0.96 0.55 0.82 0.79 0.86  CALCIUM 10.0 8.9 9.6 9.4 9.4  MG 2.2 1.6* 2.5*  --   --   PHOS 2.5 1.7* 2.5  --   --      Recent Results (from the past 240 hour(s))  MRSA Next Gen by PCR, Nasal     Status: None   Collection Time: 08/16/22  9:35 AM   Specimen: Nasal Mucosa; Nasal Swab  Result Value Ref Range Status   MRSA by PCR Next Gen NOT  DETECTED NOT DETECTED Final    Comment: (NOTE) The GeneXpert MRSA Assay (FDA approved for NASAL specimens only), is one component of a comprehensive MRSA colonization surveillance program. It is not intended to diagnose MRSA infection nor to guide or monitor treatment for MRSA infections. Test performance is not FDA approved in patients less than 29 years old. Performed at [facility], [place] [address] [place], [place] [address]      Radiology Studies: No results found.  Scheduled Meds:  amLODipine  5 mg Oral Daily   apixaban  5 mg Oral BID   aspirin EC  81 mg Oral Daily   cholecalciferol  2,000 Units Oral Daily   dapagliflozin propanediol  10 mg Oral QAC breakfast   docusate sodium  100 mg Oral BID   folic acid  1 mg Oral Daily   furosemide  20 mg Oral Daily   gabapentin  100 mg  Oral QHS   guaiFENesin  5 mL Oral QID   insulin aspart  0-15 Units Subcutaneous TID WC   lisinopril  20 mg Oral Daily   metoprolol succinate  100 mg Oral q morning   metoprolol succinate  100 mg Oral QPM   pantoprazole  40 mg Oral Daily   potassium chloride  40 mEq Oral BID   rosuvastatin  40 mg Oral QHS   senna  1 tablet Oral BID   Continuous Infusions:  sodium chloride Stopped (08/17/22 0012)     LOS: 10 days   Shelly Coss, MD [facility] P12/31/2023, 2:52 PM

## 2022-08-23 NOTE — Plan of Care (Signed)
Increased nightly metoprolol. Rates 100s-110 are ok as long as she is asymptomatic. Recommend hydration as needed. Can reach out if there are any issues with admission to inpatient rehab. Otherwise cardiology will sign off.

## 2022-08-24 DIAGNOSIS — I4891 Unspecified atrial fibrillation: Secondary | ICD-10-CM | POA: Diagnosis not present

## 2022-08-24 DIAGNOSIS — I5032 Chronic diastolic (congestive) heart failure: Secondary | ICD-10-CM | POA: Diagnosis not present

## 2022-08-24 DIAGNOSIS — I2510 Atherosclerotic heart disease of native coronary artery without angina pectoris: Secondary | ICD-10-CM | POA: Diagnosis not present

## 2022-08-24 DIAGNOSIS — I2583 Coronary atherosclerosis due to lipid rich plaque: Secondary | ICD-10-CM

## 2022-08-24 DIAGNOSIS — R29898 Other symptoms and signs involving the musculoskeletal system: Secondary | ICD-10-CM | POA: Diagnosis not present

## 2022-08-24 LAB — BASIC METABOLIC PANEL
Anion gap: 13 (ref 5–15)
BUN: 16 mg/dL (ref 8–23)
CO2: 26 mmol/L (ref 22–32)
Calcium: 9.4 mg/dL (ref 8.9–10.3)
Chloride: 103 mmol/L (ref 98–111)
Creatinine, Ser: 0.85 mg/dL (ref 0.44–1.00)
GFR, Estimated: >60 mL/min (ref >60)
Glucose, Bld: 106 mg/dL — ABNORMAL HIGH (ref 70–99)
Potassium: 3.8 mmol/L (ref 3.5–5.1)
Sodium: 142 mmol/L (ref 135–145)

## 2022-08-24 LAB — GLUCOSE, CAPILLARY
Glucose-Capillary: 99 mg/dL (ref 70–99)
Glucose-Capillary: 100 mg/dL — ABNORMAL HIGH (ref 70–99)
Glucose-Capillary: 125 mg/dL — ABNORMAL HIGH (ref 70–99)
Glucose-Capillary: 143 mg/dL — ABNORMAL HIGH (ref 70–99)

## 2022-08-24 MED ORDER — OXYCODONE HCL 5 MG PO TABS: 5.0000 mg | ORAL_TABLET | Freq: Four times a day (QID) | ORAL | Status: DC | PRN

## 2022-08-24 MED ORDER — MELATONIN 3 MG PO TABS
3.0000 mg | ORAL_TABLET | Freq: Every day | ORAL | Status: DC
  Administered 2022-08-24 – 2022-08-26 (×3): 3 mg via ORAL
  Filled 2022-08-24 (×3): qty 1

## 2022-08-24 MED ORDER — DILTIAZEM LOAD VIA INFUSION
15.0000 mg | Freq: Once | INTRAVENOUS | Status: AC
  Administered 2022-08-24: 15 mg via INTRAVENOUS
  Filled 2022-08-24: qty 15

## 2022-08-24 MED ORDER — DILTIAZEM HCL-DEXTROSE 125-5 MG/125ML-% IV SOLN (PREMIX)
5.0000 mg/h | INTRAVENOUS | Status: DC
  Administered 2022-08-24: 5 mg/h via INTRAVENOUS
  Filled 2022-08-24 (×2): qty 125

## 2022-08-24 NOTE — Progress Notes (Signed)
PROGRESS NOTE  Erika Gates  [id number] DOB: Nov 27, 1934 DOA: 08/10/2022 PCP: Donald Prose, MD   Brief Narrative: Patient is a 88 year old female with past medical history of coronary disease status post CABG, hypertension, hyperlipidemia, obesity who presented with progressive bilateral upper extremities, lower extremity weakness.  She was found to have a spondylolysis with myelopathy, quadriparesis.  Neurosurgery consulted and she underwent anterior cervical decompression, arthrodesis on  with structural spacer and allograft anterior plate fixation [id number] on 12/22.she had sudden onset of altered mental status and went into PEA arrest on 12/24.  Had 7 minutes of resuscitation finally achieving ROSC.  Transferred to ICU for further management and kept on vent.  Patient transferred to [facility] service on 12/27.  Hospital course remarkable for A-fib with RVR.  PT recommending acute inpatient rehab on discharge  Assessment & Plan:  Principal Problem:   Upper extremity weakness Active Problems:   MORBID OBESITY   Essential hypertension   History of coronary artery disease s/p CABG    Weakness of left lower extremity   Anxiety   Cervical stenosis of spinal canal   Cardiac arrest: Suspected to be secondary to polypharmacy induced respiratory depression.  Currently alert and oriented.  Extubated. On 2 L of oxygen currently.  On home she is on 1 to 2 L as needed at night.  A-fib with RVR: New problem.CHADS2vasc score 6 .  Cardiology was consulted,  started  metoprolol.   Cardiology recommended to start on anticoagulation.Started on eliquis.  She went back to A-fib with RVR today with heart rate in the range of 120s.  Cardiology started on Cardizem drip  Status post cervical spine decompression: Neurosurgery were following.  History of cervical spinal stenosis, bilateral upper extremity weakness.  Status post decompression surgery on 12/22.  PT/OT recommending acute inpatient rehab  History of  coronary artery disease: Status post CABG.  No anginal symptoms.  On metoprolol, statin, aspirin  History of hypertension: On metoprolol,  lisinopril at home. Amlodipine is stopped  AKI: Resolved  Hypokalemia: Supplemented with potassium and corrected  Diabetes: Continue sliding scale insulin  Thrombocytopenia: Stable.  Morbid obesity: BMI 39.9    Pressure Injury 08/16/22 Buttocks Medial Deep Tissue Pressure Injury - Purple or maroon localized area of discolored intact skin or blood-filled blister due to damage of underlying soft tissue from pressure and/or shear. (Active)  08/16/22 2000  Location: Buttocks  Location Orientation: Medial  Staging: Deep Tissue Pressure Injury - Purple or maroon localized area of discolored intact skin or blood-filled blister due to damage of underlying soft tissue from pressure and/or shear.  Wound Description (Comments):   Present on Admission:   Dressing Type Foam - Lift dressing to assess site every shift 08/23/22 2048    DVT prophylaxis:SCD's Start: 08/14/22 1315 Place and maintain sequential compression device Start: 08/13/22 1027 apixaban (ELIQUIS) tablet 5 mg     Code Status: Full Code  Family Communication: daughter at bedside  Patient status:Inpatient  Patient is from :Home  Anticipated discharge to:AIR  Estimated DC date:whenever bed is available after well-controlled heart rate   Consultants: Neurosurgery, PCCM, cardiology  Procedures: Intubation  Antimicrobials:  Anti-infectives (From admission, onward)    Start     Dose/Rate Route Frequency Ordered Stop   08/14/22 1800  ceFAZolin (ANCEF) IVPB 2g/100 mL premix        2 g 200 mL/hr over 30 Minutes Intravenous Every 8 hours 08/14/22 1314 08/15/22 1147   08/14/22 0733  ceFAZolin (ANCEF) 2-4 GM/100ML-% IVPB  Note to Pharmacy: Erika Gates (: cabinet override      08/14/22 7425 08/14/22 1944       Subjective: Patient seen and examined the bedside today.   She again went into A-fib with RVR.  Denies shortness of breath or cough or chest pain.  Daughter at the bedside.  Long discussion done at the bedside about her treatment plan  Objective: Vitals:   08/24/22 0046 08/24/22 0421 08/24/22 0449 08/24/22 0759  BP: 137/83 125/65  (!) 149/64  Pulse: 91 (!) 116 (!) 108 (!) 121  Resp: 20 20  17   Temp: 99.1 F (37.3 C) 100 F (37.8 C)  98.2 F (36.8 C)  TempSrc: Oral Oral  Oral  SpO2: 98% 99%  95%  Weight:      Height:        Intake/Output Summary (Last 24 hours) at 08/24/2022 1154 Last data filed at 08/24/2022 9563 Gross per 24 hour  Intake 849.44 ml  Output 950 ml  Net -100.56 ml   Filed Weights   08/14/22 1215 08/18/22 0200  Weight: 103 kg 95.8 kg    Examination:   General exam: Overall comfortable, not in distress,obese HEENT: PERRL Respiratory system:  no wheezes or crackles  Cardiovascular system: A-fib with RVR Gastrointestinal system: Abdomen is nondistended, soft and nontender. Central nervous system: Alert and oriented Extremities: No edema, no clubbing ,no cyanosis Skin: No rashes, no ulcers,no icterus       Data Reviewed: I have personally reviewed following labs and imaging studies  CBC: Recent Labs  Lab 08/18/22 0320 08/21/22 0258 08/23/22 0232  WBC 6.7 6.5 7.0  NEUTROABS 5.5  --   --   HGB 9.2* 9.2* 9.6*  HCT 30.5* 31.0* 31.8*  MCV 91.6 93.7 91.4  PLT 130* 154 875   Basic Metabolic Panel: Recent Labs  Lab 08/18/22 0320 08/19/22 0803 08/21/22 0258 08/23/22 0232 08/24/22 0312  NA 144 144 143 141 142  K 3.1* 4.1 4.4 3.1* 3.8  CL 114* 111 106 102 103  CO2 22 26 24 28 26   GLUCOSE 77 107* 105* 113* 106*  BUN 14 16 15 17 16   CREATININE 0.55 0.82 0.79 0.86 0.85  CALCIUM 8.9 9.6 9.4 9.4 9.4  MG 1.6* 2.5*  --   --   --   PHOS 1.7* 2.5  --   --   --      Recent Results (from the past 240 hour(s))  MRSA Next Gen by PCR, Nasal     Status: None   Collection Time: 08/16/22  9:35 AM   Specimen: Nasal  Mucosa; Nasal Swab  Result Value Ref Range Status   MRSA by PCR Next Gen NOT DETECTED NOT DETECTED Final    Comment: (NOTE) The GeneXpert MRSA Assay (FDA approved for NASAL specimens only), is one component of a comprehensive MRSA colonization surveillance program. It is not intended to diagnose MRSA infection nor to guide or monitor treatment for MRSA infections. Test performance is not FDA approved in patients less than 76 years old. Performed at [facility], [place] [address], [place], [place] [address]      Radiology Studies: No results found.  Scheduled Meds:  apixaban  5 mg Oral BID   aspirin EC  81 mg Oral Daily   cholecalciferol  2,000 Units Oral Daily   dapagliflozin propanediol  10 mg Oral QAC breakfast   docusate sodium  100 mg Oral BID   folic acid  1 mg Oral Daily  furosemide  20 mg Oral Daily   gabapentin  100 mg Oral QHS   guaiFENesin  5 mL Oral QID   insulin aspart  0-15 Units Subcutaneous TID WC   lisinopril  20 mg Oral Daily   melatonin  3 mg Oral QHS   metoprolol succinate  100 mg Oral q morning   metoprolol succinate  100 mg Oral QPM   pantoprazole  40 mg Oral Daily   rosuvastatin  40 mg Oral QHS   senna  1 tablet Oral BID   Continuous Infusions:  sodium chloride Stopped (08/17/22 0012)   diltiazem (CARDIZEM) infusion 5 mg/hr (08/24/22 1149)     LOS: 11 days   Shelly Coss, MD [facility] P1/08/2022, 11:54 AM

## 2022-08-24 NOTE — Progress Notes (Addendum)
Rounding Note    Patient Name: Erika Gates Date of Encounter: 08/24/2022  [place] Cardiologist: Sinclair Grooms, MD   Subjective   No complaints.  HR still in the 120's in afib  Inpatient Medications    Scheduled Meds:  amLODipine  5 mg Oral Daily   apixaban  5 mg Oral BID   aspirin EC  81 mg Oral Daily   cholecalciferol  2,000 Units Oral Daily   dapagliflozin propanediol  10 mg Oral QAC breakfast   docusate sodium  100 mg Oral BID   folic acid  1 mg Oral Daily   furosemide  20 mg Oral Daily   gabapentin  100 mg Oral QHS   guaiFENesin  5 mL Oral QID   insulin aspart  0-15 Units Subcutaneous TID WC   lisinopril  20 mg Oral Daily   metoprolol succinate  100 mg Oral q morning   metoprolol succinate  100 mg Oral QPM   pantoprazole  40 mg Oral Daily   rosuvastatin  40 mg Oral QHS   senna  1 tablet Oral BID   Continuous Infusions:  sodium chloride Stopped (08/17/22 0012)   PRN Meds: acetaminophen **OR** acetaminophen, bisacodyl, clonazepam, lip balm, menthol-cetylpyridinium **OR** phenol, metoprolol tartrate, ondansetron **OR** ondansetron (ZOFRAN) IV, polyethylene glycol, polyvinyl alcohol, sodium chloride, sodium phosphate   Vital Signs    Vitals:   08/24/22 0046 08/24/22 0421 08/24/22 0449 08/24/22 0759  BP: 137/83 125/65  (!) 149/64  Pulse: 91 (!) 116 (!) 108 (!) 121  Resp: 20 20  17   Temp: 99.1 F (37.3 C) 100 F (37.8 C)  98.2 F (36.8 C)  TempSrc: Oral Oral  Oral  SpO2: 98% 99%  95%  Weight:      Height:        Intake/Output Summary (Last 24 hours) at 08/24/2022 0939 Last data filed at 08/24/2022 2671 Gross per 24 hour  Intake 849.44 ml  Output 950 ml  Net -100.56 ml       08/18/2022    2:00 AM 08/14/2022   12:15 PM 10/28/2021   11:43 AM  Last 3 Weights  Weight (lbs) 211 lb 3.2 oz 227 lb 1.2 oz 226 lb 12.8 oz  Weight (kg) 95.8 kg 103 kg 102.876 kg      Telemetry    Atrial fibrillation with RVR in the 120's but as high a  145bpm- Personally Reviewed  ECG    No new EKG to review- Personally Reviewed  Physical Exam   GEN: Well nourished, well developed in no acute distress HEENT: Normal NECK: No JVD; No carotid bruits LYMPHATICS: No lymphadenopathy CARDIAC:irregularly irregular and tachy, no murmurs, rubs, gallops RESPIRATORY:  Clear to auscultation without rales, wheezing or rhonchi  ABDOMEN: Soft, non-tender, non-distended MUSCULOSKELETAL:  No edema; No deformity  SKIN: Warm and dry NEUROLOGIC:  Alert and oriented x 3 PSYCHIATRIC:  Normal affect  Labs    High Sensitivity Troponin:   Recent Labs  Lab 08/10/22 1649 08/10/22 1940 08/16/22 0755  TROPONINIHS 11 11 61*      Chemistry Recent Labs  Lab 08/18/22 0320 08/19/22 0803 08/21/22 0258 08/23/22 0232 08/24/22 0312  NA 144 144 143 141 142  K 3.1* 4.1 4.4 3.1* 3.8  CL 114* 111 106 102 103  CO2 22 26 24 28 26   GLUCOSE 77 107* 105* 113* 106*  BUN 14 16 15 17 16   CREATININE 0.55 0.82 0.79 0.86 0.85  CALCIUM 8.9 9.6 9.4 9.4  9.4  MG 1.6* 2.5*  --   --   --   PROT 5.3*  --   --   --   --   ALBUMIN 2.1*  --   --   --   --   AST 76*  --   --   --   --   ALT 69*  --   --   --   --   ALKPHOS 62  --   --   --   --   BILITOT 0.6  --   --   --   --   GFRNONAA >60 >60 >60 >60 >60  ANIONGAP 8 7 13 11 13      Lipids No results for input(s): "CHOL", "TRIG", "HDL", "LABVLDL", "LDLCALC", "CHOLHDL" in the last 168 hours.  Hematology Recent Labs  Lab 08/18/22 0320 08/21/22 0258 08/23/22 0232  WBC 6.7 6.5 7.0  RBC 3.33* 3.31* 3.48*  HGB 9.2* 9.2* 9.6*  HCT 30.5* 31.0* 31.8*  MCV 91.6 93.7 91.4  MCH 27.6 27.8 27.6  MCHC 30.2 29.7* 30.2  RDW 15.2 15.3 15.2  PLT 130* 154 191    Thyroid No results for input(s): "TSH", "FREET4" in the last 168 hours.  BNPNo results for input(s): "BNP", "PROBNP" in the last 168 hours.  DDimer No results for input(s): "DDIMER" in the last 168 hours.   Radiology    No results found.  Cardiac Studies      Patient Profile     88 y.o. female with CAD s/p CABG in 1996, HTN, HLD, DM, Diastolic CHF, morbid obesity who converted to atrial fib with RVR following PEA arrest on 08/16/22. She had been admitted on 08/10/22 with neurological issues (myelopathy and quadriparesis) requiring cervical decompression. 2 days later she had a PEA arrest felt to be due to polypharmacy. New onset atrial fibrillation 08/18/22. Cardiology consulted for atrial fib with RVR. She was seen on 08/19/22 and her rate was controlled on Toprol 100 mg daily.   Assessment & Plan    Atrial fib, persistent:  -not on Toprol XL 100mg  BID with HRs still in the 120's -She is tolerating the elevated heart rate well hemodynamically.  -BP is stable, actually elevated. She has been cleared to start Eliquis by the Neurosurgery team.  -HR still not well controlled so will stop Amlodipine and start IV Cardizem gtt -Continue Toprol XL 100mg  BID  ASCAD -s/p remote CABG -denies any angina -continue ASA, statin and BB  Chronic diastolic CHF -appears euvolemic on exam today -BP controlled -continue [place] 10mg  daily, Lasix 20mg  daily, Toprol XL  For questions or updates, please contact [place] Please consult [email or web address] for contact info under        Signed, Fransico Him, MD  08/24/2022, 9:39 AM

## 2022-08-25 ENCOUNTER — Encounter (HOSPITAL_COMMUNITY): Payer: Self-pay | Admitting: Neurological Surgery

## 2022-08-25 DIAGNOSIS — R29898 Other symptoms and signs involving the musculoskeletal system: Secondary | ICD-10-CM | POA: Diagnosis not present

## 2022-08-25 LAB — BASIC METABOLIC PANEL
Anion gap: 10 (ref 5–15)
BUN: 17 mg/dL (ref 8–23)
CO2: 26 mmol/L (ref 22–32)
Calcium: 9.3 mg/dL (ref 8.9–10.3)
Chloride: 104 mmol/L (ref 98–111)
Creatinine, Ser: 0.85 mg/dL (ref 0.44–1.00)
GFR, Estimated: >60 mL/min (ref >60)
Glucose, Bld: 111 mg/dL — ABNORMAL HIGH (ref 70–99)
Potassium: 3.6 mmol/L (ref 3.5–5.1)
Sodium: 140 mmol/L (ref 135–145)

## 2022-08-25 LAB — CBC
HCT: 32 % — ABNORMAL LOW (ref 36.0–46.0)
Hemoglobin: 10 g/dL — ABNORMAL LOW (ref 12.0–15.0)
MCH: 28.5 pg (ref 26.0–34.0)
MCHC: 31.3 g/dL (ref 30.0–36.0)
MCV: 91.2 fL (ref 80.0–100.0)
Platelets: 214 10*3/uL (ref 150–400)
RBC: 3.51 MIL/uL — ABNORMAL LOW (ref 3.87–5.11)
RDW: 15.8 % — ABNORMAL HIGH (ref 11.5–15.5)
WBC: 6.4 10*3/uL (ref 4.0–10.5)
nRBC: 0 % (ref 0.0–0.2)

## 2022-08-25 LAB — GLUCOSE, CAPILLARY
Glucose-Capillary: 100 mg/dL — ABNORMAL HIGH (ref 70–99)
Glucose-Capillary: 114 mg/dL — ABNORMAL HIGH (ref 70–99)
Glucose-Capillary: 103 mg/dL — ABNORMAL HIGH (ref 70–99)
Glucose-Capillary: 122 mg/dL — ABNORMAL HIGH (ref 70–99)

## 2022-08-25 LAB — SARS CORONAVIRUS 2 BY RT PCR: SARS Coronavirus 2 by RT PCR: NEGATIVE

## 2022-08-25 MED ORDER — CALCIUM CARBONATE ANTACID 500 MG PO CHEW
400.0000 mg | CHEWABLE_TABLET | ORAL | Status: AC
  Administered 2022-08-25: 400 mg via ORAL
  Filled 2022-08-25: qty 2

## 2022-08-25 MED ORDER — DILTIAZEM HCL ER COATED BEADS 120 MG PO CP24
120.0000 mg | ORAL_CAPSULE | Freq: Every day | ORAL | Status: DC
  Administered 2022-08-25: 120 mg via ORAL
  Filled 2022-08-25: qty 1

## 2022-08-25 NOTE — Progress Notes (Signed)
PROGRESS NOTE  Erika Gates  ZOX:[id number] DOB: 29-Jan-1935 DOA: 08/10/2022 PCP: Donald Prose, MD   Brief Narrative: Patient is a 88 year old female with past medical history of coronary disease status post CABG, hypertension, hyperlipidemia, obesity who presented with progressive bilateral upper extremities, lower extremity weakness.  She was found to have a spondylolysis with myelopathy, quadriparesis.  Neurosurgery consulted and she underwent anterior cervical decompression, arthrodesis on  with structural spacer and allograft anterior plate fixation [id number] on 12/22.she had sudden onset of altered mental status and went into PEA arrest on 12/24.  Had 7 minutes of resuscitation finally achieving ROSC.  Transferred to ICU for further management and kept on vent.  Patient transferred to [facility] service on 12/27.  Hospital course remarkable for A-fib with RVR.  PT recommending acute inpatient rehab on discharge,waiting for bed  Assessment & Plan:  Principal Problem:   Upper extremity weakness Active Problems:   MORBID OBESITY   Essential hypertension   History of coronary artery disease s/p CABG    Weakness of left lower extremity   Anxiety   Cervical stenosis of spinal canal   Cardiac arrest: Suspected to be secondary to polypharmacy induced respiratory depression.  Currently alert and oriented.  Extubated. On 2 L of oxygen currently.  On home she is on 1 to 2 L as needed at night.  A-fib with RVR: New problem.CHADS2vasc score 6 .  Cardiology was consulted,  started  metoprolol.   Cardiology recommended to start on anticoagulation.Started on eliquis.  She went back to A-fib with RVR,had to be started on Cardizem drip on 1/1,now rate is controlled, started on Cardizem oral  Status post cervical spine decompression: Neurosurgery were following.  History of cervical spinal stenosis, bilateral upper extremity weakness.  Status post decompression surgery on 12/22.  PT/OT recommending acute  inpatient rehab  History of coronary artery disease: Status post CABG.  No anginal symptoms.  On metoprolol, statin, aspirin  History of hypertension: On metoprolol,  lisinopril at home. Amlodipine is stopped  AKI: Resolved  Hypokalemia: Supplemented with potassium and corrected  Diabetes: Continue sliding scale insulin.  Monitor blood sugars  Thrombocytopenia: Stable.  Morbid obesity: BMI 39.9  Deconditioning/debility: PT/OT commending acute inpatient rehab.  Patient family is [facility] on [facility].  Waiting for peer to peer review call    Pressure Injury 08/16/22 Buttocks Medial Deep Tissue Pressure Injury - Purple or maroon localized area of discolored intact skin or blood-filled blister due to damage of underlying soft tissue from pressure and/or shear. (Active)  08/16/22 2000  Location: Buttocks  Location Orientation: Medial  Staging: Deep Tissue Pressure Injury - Purple or maroon localized area of discolored intact skin or blood-filled blister due to damage of underlying soft tissue from pressure and/or shear.  Wound Description (Comments):   Present on Admission:   Dressing Type Foam - Lift dressing to assess site every shift 08/25/22 0800    DVT prophylaxis:SCD's Start: 08/14/22 1315 Place and maintain sequential compression device Start: 08/13/22 1027 apixaban (ELIQUIS) tablet 5 mg     Code Status: Full Code  Family Communication: daughter at bedside  Patient status:Inpatient  Patient is from :Home  Anticipated discharge to:AIR  Estimated DC date:whenever bed is available  Consultants: Neurosurgery, PCCM, cardiology  Procedures: Intubation  Antimicrobials:  Anti-infectives (From admission, onward)    Start     Dose/Rate Route Frequency Ordered Stop   08/14/22 1800  ceFAZolin (ANCEF) IVPB 2g/100 mL premix        2  g 200 mL/hr over 30 Minutes Intravenous Every 8 hours 08/14/22 1314 08/15/22 1147   08/14/22 0733  ceFAZolin (ANCEF) 2-4 GM/100ML-%  IVPB       Note to Pharmacy: Rennie Natter (: cabinet override      08/14/22 [id number] 08/14/22 1944       Subjective: Patient seen and examined at bedside today.  Hemodynamically stable.  Rate controlled today, blood pressure in the range of 90s.  Blood pressure stable.  Denies any new complaints  Objective: Vitals:   08/25/22 0341 08/25/22 0750 08/25/22 0755 08/25/22 1136  BP: 126/74 (!) 149/85  124/77  Pulse: 95 77 77 (!) 102  Resp: 20 (!) 27 18 16   Temp: 98.2 F (36.8 C) 98.1 F (36.7 C) 98.1 F (36.7 C) 98.4 F (36.9 C)  TempSrc: Oral Oral Oral Oral  SpO2: 94% 95%  95%  Weight:      Height:        Intake/Output Summary (Last 24 hours) at 08/25/2022 1257 Last data filed at 08/25/2022 [id number] Gross per 24 hour  Intake 306.64 ml  Output 1400 ml  Net -1093.36 ml   Filed Weights   08/14/22 1215 08/18/22 0200  Weight: 103 kg 95.8 kg    Examination:   General exam: Overall comfortable, not in distress HEENT: PERRL Respiratory system:  no wheezes or crackles  Cardiovascular system: Irregularly irregular rhythm Gastrointestinal system: Abdomen is nondistended, soft and nontender. Central nervous system: Alert and oriented Extremities: No edema, no clubbing ,no cyanosis Skin: No rashes, no ulcers,no icterus       Data Reviewed: I have personally reviewed following labs and imaging studies  CBC: Recent Labs  Lab 08/21/22 0258 08/23/22 0232 08/25/22 0403  WBC 6.5 7.0 6.4  HGB 9.2* 9.6* 10.0*  HCT 31.0* 31.8* 32.0*  MCV 93.7 91.4 91.2  PLT 154 191 [id number]   Basic Metabolic Panel: Recent Labs  Lab 08/19/22 0803 08/21/22 0258 08/23/22 0232 08/24/22 0312 08/25/22 0403  NA 144 143 141 142 140  K 4.1 4.4 3.1* 3.8 3.6  CL 111 106 102 103 104  CO2 26 24 28 26 26   GLUCOSE 107* 105* 113* 106* 111*  BUN 16 15 17 16 17   CREATININE 0.82 0.79 0.86 0.85 0.85  CALCIUM 9.6 9.4 9.4 9.4 9.3  MG 2.5*  --   --   --   --   PHOS 2.5  --   --   --   --      Recent Results  (from the past 240 hour(s))  MRSA Next Gen by PCR, Nasal     Status: None   Collection Time: 08/16/22  9:35 AM   Specimen: Nasal Mucosa; Nasal Swab  Result Value Ref Range Status   MRSA by PCR Next Gen NOT DETECTED NOT DETECTED Final    Comment: (NOTE) The GeneXpert MRSA Assay (FDA approved for NASAL specimens only), is one component of a comprehensive MRSA colonization surveillance program. It is not intended to diagnose MRSA infection nor to guide or monitor treatment for MRSA infections. Test performance is not FDA approved in patients less than 76 years old. Performed at [facility], [place] [address] [place], [place] [address]      Radiology Studies: No results found.  Scheduled Meds:  apixaban  5 mg Oral BID   aspirin EC  81 mg Oral Daily   cholecalciferol  2,000 Units Oral Daily   dapagliflozin propanediol  10 mg Oral QAC breakfast  diltiazem  120 mg Oral Daily   docusate sodium  100 mg Oral BID   folic acid  1 mg Oral Daily   furosemide  20 mg Oral Daily   gabapentin  100 mg Oral QHS   guaiFENesin  5 mL Oral QID   insulin aspart  0-15 Units Subcutaneous TID WC   lisinopril  20 mg Oral Daily   melatonin  3 mg Oral QHS   metoprolol succinate  100 mg Oral q morning   metoprolol succinate  100 mg Oral QPM   pantoprazole  40 mg Oral Daily   rosuvastatin  40 mg Oral QHS   senna  1 tablet Oral BID   Continuous Infusions:  sodium chloride Stopped (08/17/22 0012)     LOS: 12 days   Shelly Coss, MD [facility] P1/09/2022, 12:57 PM

## 2022-08-25 NOTE — Progress Notes (Signed)
Occupational Therapy Treatment Patient Details Name: Erika Gates MRN: [id number] DOB: 04/23/1935 Today's Date: 08/25/2022   History of present illness Pt is an 88 year old admitted with progressive bilateral upper extremity and lower extremity weakness. MRI of the cervical spine demonstrates high-grade stenosis at C3-4 C4-5 and C5-6; C3-4 and 5 6 with evidence of cord compression. 12/22 ACDF C3-6. PEA arrest 12/24, ROSC 7 minutes.  Intubated 12/24-12/25. PMH: CAD s/p CABG, Anxiety, HTN, hyperlipidemia and morbid obesity   OT comments  Patient in bed with HOB raised and attempting to feed self. COTA assisted patient with positioning LUE elbow and lowering table to allow patient better access to plate and bring food to mouth. Red foam added to utensils to assist with grip. Patient demonstrated gains with self feeding with min assist from min to mod assist and patient able to manage cups without assistance. Patient demonstrated gains with bed mobility from mod assist to min assist to get to EOB. Patient would benefit from continued OT treatment in AIR setting to further address self feeding and mobility and increase skill level for transfers. Acute OT to continue to follow.    Recommendations for follow up therapy are one component of a multi-disciplinary discharge planning process, led by the attending physician.  Recommendations may be updated based on patient status, additional functional criteria and insurance authorization.    Follow Up Recommendations  Acute inpatient rehab (3hours/day)     Assistance Recommended at Discharge Frequent or constant Supervision/Assistance  Patient can return home with the following  A lot of help with walking and/or transfers;A lot of help with bathing/dressing/bathroom;Assistance with feeding;Assistance with cooking/housework;Assist for transportation;Help with stairs or ramp for entrance   Equipment Recommendations  Tub/shower bench;Wheelchair  (measurements OT);Wheelchair cushion (measurements OT);Hospital bed    Recommendations for Other Services      Precautions / Restrictions Precautions Precautions: Fall;Cervical;Other (comment) Precaution Booklet Issued: No Precaution Comments: watch HR Restrictions Weight Bearing Restrictions: No       Mobility Bed Mobility Overal bed mobility: Needs Assistance Bed Mobility: Rolling, Sit to Sidelying, Supine to Sit Rolling: Min assist   Supine to sit: HOB elevated, Min assist   Sit to sidelying: Mod assist General bed mobility comments: min assist with trunk and patient able to scoot to EOB    Transfers Overall transfer level: Needs assistance                 General transfer comment: not attempted with OT     Balance Overall balance assessment: Needs assistance Sitting-balance support: No upper extremity supported, Feet supported, Single extremity supported Sitting balance-Leahy Scale: Fair Sitting balance - Comments: min guard to supervision while on EOB performing grooming tasks                                   ADL either performed or assessed with clinical judgement   ADL Overall ADL's : Needs assistance/impaired Eating/Feeding: Minimal assistance;With adaptive utensils Eating/Feeding Details (indicate cue type and reason): occasional assist to load utensils, able to manage cups, built up utensils to improve grip Grooming: Wash/dry hands;Wash/dry face;Oral care;Min guard;Sitting;Minimal assistance Grooming Details (indicate cue type and reason): performed seated on EOB with min guard assist and min assist to apply toothpaste to brush                               General  ADL Comments: improvement reaching face for grooming    Extremity/Trunk Assessment Upper Extremity Assessment RUE Deficits / Details: grossly: shoulder 2/5, elbow 3+/5 flexion and extension, wrist 3+/5, gross grasp and release. RUE Coordination: decreased  fine motor;decreased gross motor LUE Deficits / Details: grossly: shoulder 1/5, elbow flexion 3+/5 and extension 3+/5, wrist flexion/extension 3+/5. LUE Sensation: decreased light touch LUE Coordination: decreased fine motor;decreased gross motor            Vision       Perception     Praxis      Cognition Arousal/Alertness: Awake/alert Behavior During Therapy: Flat affect Overall Cognitive Status: Impaired/Different from baseline Area of Impairment: Memory, Problem solving, Following commands                     Memory: Decreased short-term memory, Decreased recall of precautions Following Commands: Follows one step commands consistently     Problem Solving: Slow processing, Requires verbal cues, Difficulty sequencing General Comments: improvement with following commands        Exercises      Shoulder Instructions       General Comments attempted scooting towards HOB while seated on EOB with mod to max assist    Pertinent Vitals/ Pain       Pain Assessment Pain Assessment: Faces Faces Pain Scale: Hurts a little bit Pain Location: generalized during bed mobility Pain Descriptors / Indicators: Grimacing Pain Intervention(s): Monitored during session, Repositioned  Home Living                                          Prior Functioning/Environment              Frequency  Min 2X/week        Progress Toward Goals  OT Goals(current goals can now be found in the care plan section)  Progress towards OT goals: Progressing toward goals  Acute Rehab OT Goals Patient Stated Goal: get better OT Goal Formulation: With patient Time For Goal Achievement: 08/26/22 Potential to Achieve Goals: Good ADL Goals Pt Will Perform Eating: with min assist;with adaptive utensils;sitting Pt Will Perform Grooming: with min assist;sitting;with adaptive equipment Pt Will Perform Upper Body Bathing: with min assist;standing;with adaptive  equipment Pt Will Transfer to Toilet: with mod assist;stand pivot transfer Pt/caregiver will Perform Home Exercise Program: Increased strength;Increased ROM;Both right and left upper extremity;With written HEP provided;With theraputty  Plan Discharge plan remains appropriate;Frequency remains appropriate    Co-evaluation                 AM-PAC OT "6 Clicks" Daily Activity     Outcome Measure   Help from another person eating meals?: A Little Help from another person taking care of personal grooming?: A Little Help from another person toileting, which includes using toliet, bedpan, or urinal?: Total Help from another person bathing (including washing, rinsing, drying)?: A Lot Help from another person to put on and taking off regular upper body clothing?: A Lot Help from another person to put on and taking off regular lower body clothing?: Total 6 Click Score: 12    End of Session    OT Visit Diagnosis: Unsteadiness on feet (R26.81);Other abnormalities of gait and mobility (R26.89);Muscle weakness (generalized) (M62.81);Pain   Activity Tolerance Patient tolerated treatment well   Patient Left in bed;with call bell/phone within reach;with family/visitor present   Nurse Communication Mobility  status        Time: 9509-3267 OT Time Calculation (min): 31 min  Charges: OT General Charges $OT Visit: 1 Visit OT Treatments $Self Care/Home Management : 23-37 mins  Lodema Hong, [place]  Office [place] 08/25/2022, 12:25 PM

## 2022-08-25 NOTE — Progress Notes (Signed)
Physical Therapy Treatment Patient Details Name: Erika Gates MRN: [id number] DOB: 1935-03-31 Today's Date: 08/25/2022   History of Present Illness Pt is an 88 year old admitted with progressive bilateral upper extremity and lower extremity weakness. MRI of the cervical spine demonstrates high-grade stenosis at C3-4 C4-5 and C5-6; C3-4 and 5 6 with evidence of cord compression. 12/22 ACDF C3-6. PEA arrest 12/24, ROSC 7 minutes.  Intubated 12/24-12/25. PMH: CAD s/p CABG, Anxiety, HTN, hyperlipidemia and morbid obesity    PT Comments    Patient highly motivated. Requires seated rest breaks with vc to slow her breathing between standing transfers. HRmax 150 (when standing). Bed at lowest height, using momentum, and using bil hands up on RW with PT to anchor RW during transfers. Daughter present and commenting on how much pt has improved.     Recommendations for follow up therapy are one component of a multi-disciplinary discharge planning process, led by the attending physician.  Recommendations may be updated based on patient status, additional functional criteria and insurance authorization.  Follow Up Recommendations  Acute inpatient rehab (3hours/day)     Assistance Recommended at Discharge Frequent or constant Supervision/Assistance  Patient can return home with the following Two people to help with walking and/or transfers;A lot of help with bathing/dressing/bathroom;Assistance with cooking/housework;Assist for transportation;Help with stairs or ramp for entrance   Equipment Recommendations  Rolling walker (2 wheels)    Recommendations for Other Services       Precautions / Restrictions Precautions Precautions: Fall;Cervical;Other (comment) Precaution Booklet Issued: No Precaution Comments: watch HR Required Braces or Orthoses:  (no brace needed) Restrictions Weight Bearing Restrictions: No Other Position/Activity Restrictions: no brace needed     Mobility  Bed  Mobility Overal bed mobility: Needs Assistance Bed Mobility: Sit to Sidelying, Supine to Sit     Supine to sit: HOB elevated, Supervision   Sit to sidelying: Mod assist General bed mobility comments: pt required incr time to get to sitting EOB with feet on the floor, however did not need physical assist; return to side needed assist to raise legs up on bed    Transfers Overall transfer level: Needs assistance Equipment used: Rolling walker (2 wheels) Transfers: Sit to/from Stand, Bed to chair/wheelchair/BSC Sit to Stand: Mod assist          Lateral/Scoot Transfers: Mod assist General transfer comment: using momentum, patient starting with hands on RW, and blocking Rt foot to prevent sliding forward; boosting assist and forward translation; repeated x 3; lateral scoot to her rt x 1    Ambulation/Gait               General Gait Details: unable   Stairs             Wheelchair Mobility    Modified Rankin (Stroke Patients Only)       Balance Overall balance assessment: Needs assistance Sitting-balance support: No upper extremity supported, Feet supported, Single extremity supported Sitting balance-Leahy Scale: Fair Sitting balance - Comments: min guard to supervision   Standing balance support: Bilateral upper extremity supported, During functional activity Standing balance-Leahy Scale: Poor Standing balance comment: heavy reliance of BUE on RW, able to stand ~10 seconds each trial                            Cognition Arousal/Alertness: Awake/alert Behavior During Therapy: Flat affect Overall Cognitive Status: Impaired/Different from baseline Area of Impairment: Memory, Problem solving, Following commands  Memory: Decreased short-term memory, Decreased recall of precautions Following Commands: Follows one step commands consistently     Problem Solving: Slow processing, Requires verbal cues, Difficulty  sequencing General Comments: consistently following commands        Exercises General Exercises - Lower Extremity Ankle Circles/Pumps: AROM, Both, 10 reps, Supine Long Arc Quad: AROM, Right, Left, Seated, 10 reps Straight Leg Raises: AROM, Both, 10 reps Hip Flexion/Marching: AROM, Strengthening, Both, 20 reps, Seated    General Comments General comments (skin integrity, edema, etc.): attempted scooting towards HOB while seated on EOB with mod to max assist      Pertinent Vitals/Pain Pain Assessment Pain Assessment: No/denies pain    Home Living                          Prior Function            PT Goals (current goals can now be found in the care plan section) Acute Rehab PT Goals Patient Stated Goal: to walk PT Goal Formulation: With patient Time For Goal Achievement: 08/29/22 Potential to Achieve Goals: Fair Progress towards PT goals: Progressing toward goals    Frequency    Min 3X/week      PT Plan Current plan remains appropriate    Co-evaluation              AM-PAC PT "6 Clicks" Mobility   Outcome Measure  Help needed turning from your back to your side while in a flat bed without using bedrails?: A Lot Help needed moving from lying on your back to sitting on the side of a flat bed without using bedrails?: A Lot Help needed moving to and from a bed to a chair (including a wheelchair)?: Total Help needed standing up from a chair using your arms (e.g., wheelchair or bedside chair)?: A Lot Help needed to walk in hospital room?: Total Help needed climbing 3-5 steps with a railing? : Total 6 Click Score: 9    End of Session   Activity Tolerance: Patient tolerated treatment well Patient left: in bed;with call bell/phone within reach;with bed alarm set;with family/visitor present Nurse Communication: Mobility status PT Visit Diagnosis: Unsteadiness on feet (R26.81);Pain;Muscle weakness (generalized) (M62.81);Other symptoms and signs  involving the nervous system (R29.898) Pain - part of body: Shoulder;Knee     Time: 1300-1335 PT Time Calculation (min) (ACUTE ONLY): 35 min  Charges:  $Gait Training: 23-37 mins                      [place]  Office [phone]    Rexanne Mano 08/25/2022, 2:52 PM

## 2022-08-25 NOTE — Progress Notes (Signed)
Rounding Note    Patient Name: Erika Gates Date of Encounter: 08/25/2022  [place] Cardiologist: Sinclair Grooms, MD   Subjective   Spoke with her [name] who is a wound care nurse at [employer].  She did better last night with low-dose clonazepam.  She was less agitated.  No complaints this morning.  Atrial fibrillation under better control.  Inpatient Medications    Scheduled Meds:  apixaban  5 mg Oral BID   aspirin EC  81 mg Oral Daily   cholecalciferol  2,000 Units Oral Daily   dapagliflozin propanediol  10 mg Oral QAC breakfast   docusate sodium  100 mg Oral BID   folic acid  1 mg Oral Daily   furosemide  20 mg Oral Daily   gabapentin  100 mg Oral QHS   guaiFENesin  5 mL Oral QID   insulin aspart  0-15 Units Subcutaneous TID WC   lisinopril  20 mg Oral Daily   melatonin  3 mg Oral QHS   metoprolol succinate  100 mg Oral q morning   metoprolol succinate  100 mg Oral QPM   pantoprazole  40 mg Oral Daily   rosuvastatin  40 mg Oral QHS   senna  1 tablet Oral BID   Continuous Infusions:  sodium chloride Stopped (08/17/22 0012)   diltiazem (CARDIZEM) infusion 5 mg/hr (08/25/22 0612)   PRN Meds: acetaminophen **OR** acetaminophen, bisacodyl, clonazepam, lip balm, menthol-cetylpyridinium **OR** phenol, metoprolol tartrate, ondansetron **OR** ondansetron (ZOFRAN) IV, polyethylene glycol, polyvinyl alcohol, sodium chloride, sodium phosphate   Vital Signs    Vitals:   08/25/22 0024 08/25/22 0341 08/25/22 0750 08/25/22 0755  BP: (!) 107/57 126/74 (!) 149/85   Pulse: 93 95 77 77  Resp: 16 20 (!) 27 18  Temp: 98.6 F (37 C) 98.2 F (36.8 C) 98.1 F (36.7 C) 98.1 F (36.7 C)  TempSrc: Oral Oral Oral Oral  SpO2:  94% 95%   Weight:      Height:        Intake/Output Summary (Last 24 hours) at 08/25/2022 0846 Last data filed at 08/25/2022 0612 Gross per 24 hour  Intake 546.64 ml  Output 1400 ml  Net -853.36 ml      08/18/2022    2:00 AM  08/14/2022   12:15 PM 10/28/2021   11:43 AM  Last 3 Weights  Weight (lbs) 211 lb 3.2 oz 227 lb 1.2 oz 226 lb 12.8 oz  Weight (kg) 95.8 kg 103 kg 102.876 kg      Telemetry    Previously A-fib 145 bpm, currently in the 70s- Personally Reviewed  ECG    No new- Personally Reviewed  Physical Exam   GEN: No acute distress.   Neck: No JVD Cardiac: Irregularly irregular, no murmurs, rubs, or gallops.  Respiratory: Clear to auscultation bilaterally. GI: Soft, nontender, non-distended  MS: No edema; No deformity. Neuro:  Nonfocal  Psych: Normal affect   Labs    High Sensitivity Troponin:   Recent Labs  Lab 08/10/22 1649 08/10/22 1940 08/16/22 0755  TROPONINIHS 11 11 61*     Chemistry Recent Labs  Lab 08/19/22 0803 08/21/22 0258 08/23/22 0232 08/24/22 0312 08/25/22 0403  NA 144   < > 141 142 140  K 4.1   < > 3.1* 3.8 3.6  CL 111   < > 102 103 104  CO2 26   < > 28 26 26   GLUCOSE 107*   < > 113* 106*  111*  BUN 16   < > 17 16 17   CREATININE 0.82   < > 0.86 0.85 0.85  CALCIUM 9.6   < > 9.4 9.4 9.3  MG 2.5*  --   --   --   --   GFRNONAA >60   < > >60 >60 >60  ANIONGAP 7   < > 11 13 10    < > = values in this interval not displayed.    Lipids No results for input(s): "CHOL", "TRIG", "HDL", "LABVLDL", "LDLCALC", "CHOLHDL" in the last 168 hours.  Hematology Recent Labs  Lab 08/21/22 0258 08/23/22 0232 08/25/22 0403  WBC 6.5 7.0 6.4  RBC 3.31* 3.48* 3.51*  HGB 9.2* 9.6* 10.0*  HCT 31.0* 31.8* 32.0*  MCV 93.7 91.4 91.2  MCH 27.8 27.6 28.5  MCHC 29.7* 30.2 31.3  RDW 15.3 15.2 15.8*  PLT 154 191 214   Thyroid No results for input(s): "TSH", "FREET4" in the last 168 hours.  BNPNo results for input(s): "BNP", "PROBNP" in the last 168 hours.  DDimer No results for input(s): "DDIMER" in the last 168 hours.   Radiology    No results found.  Cardiac Studies   Echocardiogram 07/14/2021: EF 65%  Patient Profile     88 y.o. female with CAD s/p CABG in 1996, HTN,  HLD, DM, Diastolic CHF, morbid obesity who converted to atrial fib with RVR following PEA arrest on 08/16/22. She had been admitted on 08/10/22 with neurological issues (myelopathy and quadriparesis) requiring cervical decompression. 2 days later she had a PEA arrest felt to be due to polypharmacy. New onset atrial fibrillation 08/18/22. Cardiology consulted for atrial fib with RVR. She was seen on 08/19/22 and her rate was controlled on Toprol 100 mg daily.   Assessment & Plan    Atrial fib, persistent:  -Currently on metoprolol XL 100 mg in the morning and 100 mg in the evening.  She is also on Cardizem drip 5 mg/h.  Better rate control with the addition of Cardizem.  We will go ahead and transition her to Cardizem CD 120 mg once a day.  Chronic anticoagulation -Now on Eliquis 5 mg twice a day.  Neurosurgery aware.  Coronary artery disease -s/p remote CABG -denies any angina -continue ASA, statin and BB   Chronic diastolic CHF -appears euvolemic on exam today -BP controlled -continue [place] 10mg  daily, Lasix 20mg  daily, Toprol XL, lisinopril 20    For questions or updates, please contact [place] Please consult [email or web address] for contact info under        Signed, Candee Furbish, MD  08/25/2022, 8:46 AM

## 2022-08-25 NOTE — TOC Progression Note (Signed)
Transition of Care [facility]) - Progression Note    Patient Details  Name: Erika Gates MRN: [id number] Date of Birth: 03-16-1935  Transition [facility]) CM/SW Contact  Pollie Friar, RN Phone Number: 08/25/2022, 10:49 AM  Clinical Narrative:    [facility] is offering a bed once insurance approval has been obtained. Per Estill Bamberg at [employer] is asking for a peer to peer. CM has updated Dr Tawanna Solo.  Awaiting results of peer to peer.  TOC following.   Expected Discharge Plan: [place] Barriers to Discharge: Continued Medical Work up, SNF Pending bed offer  Expected Discharge Plan and Services In-house Referral: Clinical Social Work Discharge Planning Services: CM Consult Post Acute Care Choice: [place] arrangements for the past 2 months: Single Family Home                                       Social Determinants of Health (SDOH) Interventions SDOH Screenings   Food Insecurity: No Food Insecurity (08/11/2022)  Housing: Low Risk  (08/11/2022)  Transportation Needs: No Transportation Needs (08/11/2022)  Utilities: Not At Risk (08/11/2022)  Tobacco Use: Low Risk  (08/17/2022)    Readmission Risk Interventions     No data to display

## 2022-08-25 NOTE — Progress Notes (Signed)
Speech Language Pathology Treatment: Dysphagia  Patient Details Name: Erika Gates MRN: [id number] DOB: 1934/09/19 Today's Date: 08/25/2022 Time: 1330-1350 SLP Time Calculation (min) (ACUTE ONLY): 20 min  Assessment / Plan / Recommendation Clinical Impression  Patient seen by SLP for skilled treatment session focused on dysphagia goals. Patient's daughter is in the room as well. Following discussion with patient and daughter earlier in morning, plan was made to upgrade diet to regular texture solids to allow her more choices such as salads. Unfortunately, patient's lunch meal was still cut up as per the Dys 3 diet recommendations. Patient fed self deli meat and cheese (she did not want bread). After daughter helped prepare. She was able to feed self and did not exhibit any overt s/s aspiration, penetration or oral phase difficulties with swallowing. SLP plans to keep upgraded diet in place and will f/u this week to ensure toleration.    HPI HPI: Patient is an 88 y.o. female with PMH: CAD s/p CABG, Anxiety, HTN, hyperlipidemia and morbid obesity. She presented to the hospital on 08/13/22 with progressive bilateral upper extremity and lower extremity weakness. MRI of the cervical spine demonstrates high-grade stenosis at C3-4 C4-5 and C5-6; C3-4 and 5 6 with evidence of cord compression. On 12/22 she underwent ACDF C3-6. PEA arrest occured 12/24, ROSC 7 minutes. Intubated 12/24-12/25; ST completed BSE on 12/26 recommending Dysphagia 3 (mechanical soft)/thin liquids. ST f/u for diet tolerance.      SLP Plan  Continue with current plan of care      Recommendations for follow up therapy are one component of a multi-disciplinary discharge planning process, led by the attending physician.  Recommendations may be updated based on patient status, additional functional criteria and insurance authorization.    Recommendations  Diet recommendations: Regular;Thin liquid Liquids provided via:  Straw;Cup Medication Administration: Whole meds with puree Supervision: Staff to assist with self feeding;Intermittent supervision to cue for compensatory strategies Compensations: Slow rate;Small sips/bites;Multiple dry swallows after each bite/sip;Follow solids with liquid Postural Changes and/or Swallow Maneuvers: Seated upright 90 degrees                Oral Care Recommendations: Oral care BID;Patient independent with oral care Follow Up Recommendations: Follow physician's recommendations for discharge plan and follow up therapies Assistance recommended at discharge: Frequent or constant Supervision/Assistance SLP Visit Diagnosis: Dysphagia, oropharyngeal phase (R13.12) Plan: Continue with current plan of care           Sonia Baller, MA, CCC-SLP Speech Therapy

## 2022-08-25 NOTE — Plan of Care (Signed)
  Problem: Education: Goal: Knowledge of General Education information will improve Description: Including pain rating scale, medication(s)/side effects and non-pharmacologic comfort measures Outcome: Progressing   Problem: Health Behavior/Discharge Planning: Goal: Ability to manage health-related needs will improve Outcome: Progressing   Problem: Clinical Measurements: Goal: Ability to maintain clinical measurements within normal limits will improve Outcome: Progressing Goal: Will remain free from infection Outcome: Progressing Goal: Diagnostic test results will improve Outcome: Progressing Goal: Respiratory complications will improve Outcome: Progressing Goal: Cardiovascular complication will be avoided Outcome: Progressing   Problem: Activity: Goal: Risk for activity intolerance will decrease Outcome: Progressing   Problem: Nutrition: Goal: Adequate nutrition will be maintained Outcome: Progressing   Problem: Coping: Goal: Level of anxiety will decrease Outcome: Progressing   Problem: Elimination: Goal: Will not experience complications related to bowel motility Outcome: Progressing Goal: Will not experience complications related to urinary retention Outcome: Progressing   Problem: Pain Managment: Goal: General experience of comfort will improve Outcome: Progressing   Problem: Safety: Goal: Ability to remain free from injury will improve Outcome: Progressing   Problem: Skin Integrity: Goal: Risk for impaired skin integrity will decrease Outcome: Progressing   Problem: Education: Goal: Ability to verbalize activity precautions or restrictions will improve Outcome: Progressing Goal: Knowledge of the prescribed therapeutic regimen will improve Outcome: Progressing Goal: Understanding of discharge needs will improve Outcome: Progressing   Problem: Activity: Goal: Ability to avoid complications of mobility impairment will improve Outcome: Progressing Goal:  Ability to tolerate increased activity will improve Outcome: Progressing Goal: Will remain free from falls Outcome: Progressing   Problem: Bowel/Gastric: Goal: Gastrointestinal status for postoperative course will improve Outcome: Progressing   Problem: Clinical Measurements: Goal: Ability to maintain clinical measurements within normal limits will improve Outcome: Progressing Goal: Postoperative complications will be avoided or minimized Outcome: Progressing Goal: Diagnostic test results will improve Outcome: Progressing   Problem: Pain Management: Goal: Pain level will decrease Outcome: Progressing   Problem: Skin Integrity: Goal: Will show signs of wound healing Outcome: Progressing   Problem: Health Behavior/Discharge Planning: Goal: Identification of resources available to assist in meeting health care needs will improve Outcome: Progressing   Problem: Bladder/Genitourinary: Goal: Urinary functional status for postoperative course will improve Outcome: Progressing   Problem: Education: Goal: Ability to describe self-care measures that may prevent or decrease complications (Diabetes Survival Skills Education) will improve Outcome: Progressing Goal: Individualized Educational Video(s) Outcome: Progressing   Problem: Coping: Goal: Ability to adjust to condition or change in health will improve Outcome: Progressing   Problem: Fluid Volume: Goal: Ability to maintain a balanced intake and output will improve Outcome: Progressing   Problem: Health Behavior/Discharge Planning: Goal: Ability to identify and utilize available resources and services will improve Outcome: Progressing Goal: Ability to manage health-related needs will improve Outcome: Progressing   Problem: Metabolic: Goal: Ability to maintain appropriate glucose levels will improve Outcome: Progressing   Problem: Nutritional: Goal: Maintenance of adequate nutrition will improve Outcome:  Progressing Goal: Progress toward achieving an optimal weight will improve Outcome: Progressing   Problem: Skin Integrity: Goal: Risk for impaired skin integrity will decrease Outcome: Progressing   Problem: Tissue Perfusion: Goal: Adequacy of tissue perfusion will improve Outcome: Progressing   

## 2022-08-26 ENCOUNTER — Other Ambulatory Visit: Payer: Self-pay | Admitting: Interventional Cardiology

## 2022-08-26 DIAGNOSIS — R29898 Other symptoms and signs involving the musculoskeletal system: Secondary | ICD-10-CM | POA: Diagnosis not present

## 2022-08-26 LAB — GLUCOSE, CAPILLARY
Glucose-Capillary: 109 mg/dL — ABNORMAL HIGH (ref 70–99)
Glucose-Capillary: 154 mg/dL — ABNORMAL HIGH (ref 70–99)
Glucose-Capillary: 141 mg/dL — ABNORMAL HIGH (ref 70–99)
Glucose-Capillary: 131 mg/dL — ABNORMAL HIGH (ref 70–99)

## 2022-08-26 MED ORDER — DILTIAZEM HCL ER COATED BEADS 240 MG PO CP24
240.0000 mg | ORAL_CAPSULE | Freq: Every day | ORAL | Status: DC
  Administered 2022-08-27: 240 mg via ORAL
  Filled 2022-08-26 (×2): qty 1

## 2022-08-26 MED ORDER — DILTIAZEM HCL ER COATED BEADS 120 MG PO CP24
120.0000 mg | ORAL_CAPSULE | Freq: Once | ORAL | Status: AC
  Administered 2022-08-26: 120 mg via ORAL
  Filled 2022-08-26: qty 1

## 2022-08-26 NOTE — Progress Notes (Signed)
PROGRESS NOTE  Erika Gates  ZDG:[id number] DOB: October 29, 1934 DOA: 08/10/2022 PCP: Donald Prose, MD   Brief Narrative: Patient is a 88 year old female with past medical history of coronary disease status post CABG, hypertension, hyperlipidemia, obesity who presented with progressive bilateral upper extremities, lower extremity weakness.  She was found to have a spondylolysis with myelopathy, quadriparesis.  Neurosurgery consulted and she underwent anterior cervical decompression, arthrodesis on  with structural spacer and allograft anterior plate fixation V9-D6 on 12/22.she had sudden onset of altered mental status and went into PEA arrest on 12/24.  Had 7 minutes of resuscitation finally achieving ROSC.  Transferred to ICU for further management and kept on vent.  Patient transferred to [facility] service on 12/27.  Hospital course remarkable for A-fib with RVR.  PT recommending acute inpatient rehab on discharge,plan for DC tomorrow a.m. if heart rate remains stable.  Assessment & Plan:  Principal Problem:   Upper extremity weakness Active Problems:   MORBID OBESITY   Essential hypertension   History of coronary artery disease s/p CABG    Weakness of left lower extremity   Anxiety   Cervical stenosis of spinal canal   Cardiac arrest: Suspected to be secondary to polypharmacy induced respiratory depression.  Currently alert and oriented.  Extubated. On 2 L of oxygen currently.  On home she is on 1 to 2 L as needed at night.  A-fib with RVR: New problem.CHADS2vasc score 6 .  Cardiology was consulted,  started  metoprolol.   Cardiology recommended to start on anticoagulation.Started on eliquis.  She went back to A-fib with RVR,had to be started on Cardizem drip on 1/1,now rate is more controlled, started on Cardizem oral.  Increased the dose of Cardizem to 240 mg starting from tomorrow.  Status post cervical spine decompression: Neurosurgery were following.  History of cervical spinal stenosis,  bilateral upper extremity weakness.  Status post decompression surgery on 12/22.  PT/OT recommending acute inpatient rehab  History of coronary artery disease: Status post CABG.  No anginal symptoms.  On metoprolol, statin, aspirin  History of hypertension: On metoprolol,  lisinopril at home. Amlodipine is stopped  AKI: Resolved  Hypokalemia: Supplemented with potassium and corrected  Diabetes: Continue sliding scale insulin.  Monitor blood sugars  Thrombocytopenia: Stable.  Morbid obesity: BMI 39.9  Deconditioning/debility: PT/OT commending acute inpatient rehab.  Patient family is interested on rehab at [facility].  Peer-to-peer done and it has been approved.    Pressure Injury 08/16/22 Buttocks Medial Deep Tissue Pressure Injury - Purple or maroon localized area of discolored intact skin or blood-filled blister due to damage of underlying soft tissue from pressure and/or shear. (Active)  08/16/22 2000  Location: Buttocks  Location Orientation: Medial  Staging: Deep Tissue Pressure Injury - Purple or maroon localized area of discolored intact skin or blood-filled blister due to damage of underlying soft tissue from pressure and/or shear.  Wound Description (Comments):   Present on Admission:   Dressing Type Foam - Lift dressing to assess site every shift 08/25/22 2100    DVT prophylaxis:SCD's Start: 08/14/22 1315 Place and maintain sequential compression device Start: 08/13/22 1027 apixaban (ELIQUIS) tablet 5 mg     Code Status: Full Code  Family Communication: daughter at bedside  Patient status:Inpatient  Patient is from :Home  Anticipated discharge to:[facility]  Estimated DC date:tomorrow morning, but needs to check if heart rate is stable  Consultants: Neurosurgery, PCCM, cardiology  Procedures: Intubation  Antimicrobials:  Anti-infectives (From admission, onward)    Start  Dose/Rate Route Frequency Ordered Stop   08/14/22 1800  ceFAZolin (ANCEF) IVPB 2g/100 mL  premix        2 g 200 mL/hr over 30 Minutes Intravenous Every 8 hours 08/14/22 1314 08/15/22 1147   08/14/22 0733  ceFAZolin (ANCEF) 2-4 GM/100ML-% IVPB       Note to Pharmacy: Rennie Natter (: cabinet override      08/14/22 7893 08/14/22 1944       Subjective: Patient seen and examined at bedside today.  Hemodynamically stable.  She did work  with physical therapy.  Mobility increased her heart rate to the range of 130-140.  She remains asymptomatic, blood pressure stable.  Given extra dose of Cardizem.  Objective: Vitals:   08/25/22 2054 08/25/22 2300 08/26/22 0400 08/26/22 0700  BP: 128/67 (!) 142/86 121/69   Pulse: 95 87 98 100  Resp: 18 (!) 23  20  Temp: 98.7 F (37.1 C) 98.9 F (37.2 C) 97.9 F (36.6 C) 98.7 F (37.1 C)  TempSrc: Oral Oral Oral Oral  SpO2: 98%   99%  Weight:      Height:        Intake/Output Summary (Last 24 hours) at 08/26/2022 1044 Last data filed at 08/26/2022 0359 Gross per 24 hour  Intake --  Output 900 ml  Net -900 ml   Filed Weights   08/14/22 1215 08/18/22 0200  Weight: 103 kg 95.8 kg    Examination:   General exam: Overall comfortable, not in distress,obese HEENT: PERRL Respiratory system:  no wheezes or crackles  Cardiovascular system: Irregular regular rhythm with heart rate in the range of 110 during my evaluation Gastrointestinal system: Abdomen is nondistended, soft and nontender. Central nervous system: Alert and oriented Extremities: No edema, no clubbing ,no cyanosis Skin: No rashes, no ulcers,no icterus       Data Reviewed: I have personally reviewed following labs and imaging studies  CBC: Recent Labs  Lab 08/21/22 0258 08/23/22 0232 08/25/22 0403  WBC 6.5 7.0 6.4  HGB 9.2* 9.6* 10.0*  HCT 31.0* 31.8* 32.0*  MCV 93.7 91.4 91.2  PLT 154 191 810   Basic Metabolic Panel: Recent Labs  Lab 08/21/22 0258 08/23/22 0232 08/24/22 0312 08/25/22 0403  NA 143 141 142 140  K 4.4 3.1* 3.8 3.6  CL 106 102 103  104  CO2 24 28 26 26   GLUCOSE 105* 113* 106* 111*  BUN 15 17 16 17   CREATININE 0.79 0.86 0.85 0.85  CALCIUM 9.4 9.4 9.4 9.3     Recent Results (from the past 240 hour(s))  SARS Coronavirus 2 by RT PCR (hospital order, performed in [facility] hospital lab) *cepheid single result test* Anterior Nasal Swab     Status: None   Collection Time: 08/25/22  5:13 PM   Specimen: Anterior Nasal Swab  Result Value Ref Range Status   SARS Coronavirus 2 by RT PCR NEGATIVE NEGATIVE Final    Comment: (NOTE) SARS-CoV-2 target nucleic acids are NOT DETECTED.  The SARS-CoV-2 RNA is generally detectable in upper and lower respiratory specimens during the acute phase of infection. The lowest concentration of SARS-CoV-2 viral copies this assay can detect is 250 copies / mL. A negative result does not preclude SARS-CoV-2 infection and should not be used as the sole basis for treatment or other patient management decisions.  A negative result may occur with improper specimen collection / handling, submission of specimen other than nasopharyngeal swab, presence of viral mutation(s) within the areas targeted by  this assay, and inadequate number of viral copies (<250 copies / mL). A negative result must be combined with clinical observations, patient history, and epidemiological information.  Fact Sheet for Patients:   https://www.patel.info/  Fact Sheet for Healthcare Providers: https://hall.com/  This test is not yet approved or  cleared by the [place] FDA and has been authorized for detection and/or diagnosis of SARS-CoV-2 by FDA under an Emergency Use Authorization (EUA).  This EUA will remain in effect (meaning this test can be used) for the duration of the COVID-19 declaration under Section 564(b)(1) of the Act, 21 U.S.C. section 360bbb-3(b)(1), unless the authorization is terminated or revoked sooner.  Performed at [facility], [place] [address], [place], [place] [address]      Radiology Studies: No results found.  Scheduled Meds:  apixaban  5 mg Oral BID   aspirin EC  81 mg Oral Daily   cholecalciferol  2,000 Units Oral Daily   dapagliflozin propanediol  10 mg Oral QAC breakfast   diltiazem  120 mg Oral Once   [START ON 08/27/2022] diltiazem  240 mg Oral Daily   docusate sodium  100 mg Oral BID   folic acid  1 mg Oral Daily   furosemide  20 mg Oral Daily   gabapentin  100 mg Oral QHS   guaiFENesin  5 mL Oral QID   insulin aspart  0-15 Units Subcutaneous TID WC   lisinopril  20 mg Oral Daily   melatonin  3 mg Oral QHS   metoprolol succinate  100 mg Oral q morning   metoprolol succinate  100 mg Oral QPM   pantoprazole  40 mg Oral Daily   rosuvastatin  40 mg Oral QHS   senna  1 tablet Oral BID   Continuous Infusions:  sodium chloride Stopped (08/17/22 0012)     LOS: 13 days   Shelly Coss, MD [facility] P1/10/2022, 10:44 AM

## 2022-08-26 NOTE — TOC Progression Note (Addendum)
Transition of Care [facility]) - Progression Note    Patient Details  Name: Erika Gates MRN: [id number] Date of Birth: 04-Jul-1935  [facility] CM/SW Contact  Pollie Friar, RN Phone Number: 08/26/2022, 8:41 AM  Clinical Narrative:    Pt has received insurance approval for [facility] starting today. CM attempted to arrange transport for discharge today but was unable to arrange transport until tomorrow am. MD, [facility] and family aware.  Pt will be picked up at 10 am on Thursday per [employer].  TOC following.  1136: covid test results sent to [facility]   Expected Discharge Plan: [place] Barriers to Discharge: Continued Medical Work up, SNF Pending bed offer  Expected Discharge Plan and Services In-house Referral: Clinical Social Work Discharge Planning Services: CM Consult Post Acute Care Choice: [place] arrangements for the past 2 months: Single Family Home                                       Social Determinants of Health (SDOH) Interventions SDOH Screenings   Food Insecurity: No Food Insecurity (08/11/2022)  Housing: Low Risk  (08/11/2022)  Transportation Needs: No Transportation Needs (08/11/2022)  Utilities: Not At Risk (08/11/2022)  Tobacco Use: Low Risk  (08/25/2022)    Readmission Risk Interventions     No data to display

## 2022-08-26 NOTE — Progress Notes (Signed)
Physical Therapy Treatment Patient Details Name: Erika Gates MRN: [id number] DOB: 30-Nov-1934 Today's Date: 08/26/2022   History of Present Illness Pt is an 88 year old admitted with progressive bilateral upper extremity and lower extremity weakness. MRI of the cervical spine demonstrates high-grade stenosis at C3-4 C4-5 and C5-6; C3-4 and 5 6 with evidence of cord compression. 12/22 ACDF C3-6. PEA arrest 12/24, ROSC 7 minutes.  Intubated 12/24-12/25. PMH: CAD s/p CABG, Anxiety, HTN, hyperlipidemia and morbid obesity    PT Comments    Pt remains highly motivated. Requires seated rest breaks between sit to stand trials for RR and HR to settle down. Resting HR 106, with standing 147 (however this was maximum effort for pt as increased standing time to 60 seconds for pericare to be completed); RRmax 33. Patient able to stand x 4 reps from EOB with stedy with mod assist of 1 person. LE exercises improving with more AROM and less AAROM.     Recommendations for follow up therapy are one component of a multi-disciplinary discharge planning process, led by the attending physician.  Recommendations may be updated based on patient status, additional functional criteria and insurance authorization.  Follow Up Recommendations  Acute inpatient rehab (3hours/day)     Assistance Recommended at Discharge Frequent or constant Supervision/Assistance  Patient can return home with the following Two people to help with walking and/or transfers;A lot of help with bathing/dressing/bathroom;Assistance with cooking/housework;Assist for transportation;Help with stairs or ramp for entrance   Equipment Recommendations  Rolling walker (2 wheels)    Recommendations for Other Services       Precautions / Restrictions Precautions Precautions: Fall;Cervical;Other (comment) Precaution Booklet Issued: No Precaution Comments: watch HR Required Braces or Orthoses:  (no brace needed) Restrictions Weight Bearing  Restrictions: No Other Position/Activity Restrictions: no brace needed     Mobility  Bed Mobility Overal bed mobility: Needs Assistance Bed Mobility: Sit to Sidelying, Supine to Sit     Supine to sit: HOB elevated, Supervision   Sit to sidelying: Mod assist General bed mobility comments: pt required incr time to get to sitting EOB with feet on the floor, however did not need physical assist; return to side needed assist to raise legs up on bed    Transfers Overall transfer level: Needs assistance   Transfers: Sit to/from Stand, Bed to chair/wheelchair/BSC Sit to Stand: Mod assist          Lateral/Scoot Transfers: Mod assist General transfer comment: using momentum, patient starting with hands on RW, and blocking Rt foot to prevent sliding forward; boosting assist and forward translation; repeated x 3; lateral scoot to her rt x 1 Transfer via Lift Equipment: Stedy  Ambulation/Gait               General Gait Details: unable   Chief Strategy Officer    Modified Rankin (Stroke Patients Only)       Balance Overall balance assessment: Needs assistance Sitting-balance support: No upper extremity supported, Feet supported, Single extremity supported Sitting balance-Leahy Scale: Fair Sitting balance - Comments: min guard to supervision   Standing balance support: Bilateral upper extremity supported, During functional activity Standing balance-Leahy Scale: Poor Standing balance comment: heavy reliance of BUE on stedy, able to stand ~60 sec x 2 of 4 reps                            Cognition Arousal/Alertness:  Awake/alert Behavior During Therapy: Flat affect Overall Cognitive Status: Impaired/Different from baseline Area of Impairment: Problem solving                             Problem Solving: Slow processing, Requires verbal cues, Difficulty sequencing General Comments: consistently following commands         Exercises General Exercises - Lower Extremity Ankle Circles/Pumps: AROM, Both, 10 reps, Supine Quad Sets: AROM, Both, 10 reps Short Arc Quad: Both, Seated, 10 reps Heel Slides: Supine, Strengthening, Both, 10 reps Hip ABduction/ADduction: AAROM, Both, 10 reps    General Comments        Pertinent Vitals/Pain Pain Assessment Pain Assessment: No/denies pain    Home Living                          Prior Function            PT Goals (current goals can now be found in the care plan section) Acute Rehab PT Goals Patient Stated Goal: to walk Time For Goal Achievement: 08/29/22 Potential to Achieve Goals: Fair Progress towards PT goals: Progressing toward goals    Frequency    Min 3X/week      PT Plan Current plan remains appropriate    Co-evaluation              AM-PAC PT "6 Clicks" Mobility   Outcome Measure  Help needed turning from your back to your side while in a flat bed without using bedrails?: A Lot Help needed moving from lying on your back to sitting on the side of a flat bed without using bedrails?: A Lot Help needed moving to and from a bed to a chair (including a wheelchair)?: Total Help needed standing up from a chair using your arms (e.g., wheelchair or bedside chair)?: A Lot Help needed to walk in hospital room?: Total Help needed climbing 3-5 steps with a railing? : Total 6 Click Score: 9    End of Session Equipment Utilized During Treatment: Other (comment) (stedy) Activity Tolerance: Patient tolerated treatment well Patient left: in bed;with call bell/phone within reach;with bed alarm set;with family/visitor present Nurse Communication: Mobility status PT Visit Diagnosis: Unsteadiness on feet (R26.81);Pain;Muscle weakness (generalized) (M62.81);Other symptoms and signs involving the nervous system (R29.898) Pain - part of body: Shoulder;Knee     Time: 3532-9924 PT Time Calculation (min) (ACUTE ONLY): 49 min  Charges:  $Gait  Training: 23-37 mins $Therapeutic Exercise: 8-22 mins                      [place]  Office [phone]    Rexanne Mano 08/26/2022, 9:31 AM

## 2022-08-26 NOTE — Progress Notes (Addendum)
Rounding Note    Patient Name: Erika Gates Date of Encounter: 08/26/2022  [place] Cardiologist: Belva Crome III, MD   Subjective   Had some RVR A-fib earlier this morning with PT.  Overall during the night heart rates were excellent.  Inpatient Medications    Scheduled Meds:  apixaban  5 mg Oral BID   aspirin EC  81 mg Oral Daily   cholecalciferol  2,000 Units Oral Daily   dapagliflozin propanediol  10 mg Oral QAC breakfast   diltiazem  120 mg Oral Daily   docusate sodium  100 mg Oral BID   folic acid  1 mg Oral Daily   furosemide  20 mg Oral Daily   gabapentin  100 mg Oral QHS   guaiFENesin  5 mL Oral QID   insulin aspart  0-15 Units Subcutaneous TID WC   lisinopril  20 mg Oral Daily   melatonin  3 mg Oral QHS   metoprolol succinate  100 mg Oral q morning   metoprolol succinate  100 mg Oral QPM   pantoprazole  40 mg Oral Daily   rosuvastatin  40 mg Oral QHS   senna  1 tablet Oral BID   Continuous Infusions:  sodium chloride Stopped (08/17/22 0012)   PRN Meds: acetaminophen **OR** acetaminophen, bisacodyl, clonazepam, lip balm, menthol-cetylpyridinium **OR** phenol, metoprolol tartrate, ondansetron **OR** ondansetron (ZOFRAN) IV, polyethylene glycol, polyvinyl alcohol, sodium chloride, sodium phosphate   Vital Signs    Vitals:   08/25/22 2054 08/25/22 2300 08/26/22 0400 08/26/22 0700  BP: 128/67 (!) 142/86 121/69   Pulse: 95 87 98 100  Resp: 18 (!) 23  20  Temp: 98.7 F (37.1 C) 98.9 F (37.2 C) 97.9 F (36.6 C) 98.7 F (37.1 C)  TempSrc: Oral Oral Oral Oral  SpO2: 98%   99%  Weight:      Height:        Intake/Output Summary (Last 24 hours) at 08/26/2022 1028 Last data filed at 08/26/2022 0359 Gross per 24 hour  Intake --  Output 900 ml  Net -900 ml      08/18/2022    2:00 AM 08/14/2022   12:15 PM 10/28/2021   11:43 AM  Last 3 Weights  Weight (lbs) 211 lb 3.2 oz 227 lb 1.2 oz 226 lb 12.8 oz  Weight (kg) 95.8 kg 103 kg 102.876  kg      Telemetry    Previously A-fib 145 bpm, currently in the 70s- Personally Reviewed  ECG    No new- Personally Reviewed  Physical Exam   GEN: No acute distress.   Neck: No JVD Cardiac: Irregularly irregular, no murmurs, rubs, or gallops.  Respiratory: Clear to auscultation bilaterally. GI: Soft, nontender, non-distended  MS: No edema; No deformity. Neuro:  Nonfocal  Psych: Normal affect  No change  Labs    High Sensitivity Troponin:   Recent Labs  Lab 08/10/22 1649 08/10/22 1940 08/16/22 0755  TROPONINIHS 11 11 61*     Chemistry Recent Labs  Lab 08/23/22 0232 08/24/22 0312 08/25/22 0403  NA 141 142 140  K 3.1* 3.8 3.6  CL 102 103 104  CO2 28 26 26   GLUCOSE 113* 106* 111*  BUN 17 16 17   CREATININE 0.86 0.85 0.85  CALCIUM 9.4 9.4 9.3  GFRNONAA >60 >60 >60  ANIONGAP 11 13 10     Lipids No results for input(s): "CHOL", "TRIG", "HDL", "LABVLDL", "LDLCALC", "CHOLHDL" in the last 168 hours.  Hematology Recent Labs  Lab 08/21/22 0258 08/23/22 0232 08/25/22 0403  WBC 6.5 7.0 6.4  RBC 3.31* 3.48* 3.51*  HGB 9.2* 9.6* 10.0*  HCT 31.0* 31.8* 32.0*  MCV 93.7 91.4 91.2  MCH 27.8 27.6 28.5  MCHC 29.7* 30.2 31.3  RDW 15.3 15.2 15.8*  PLT 154 191 214   Thyroid No results for input(s): "TSH", "FREET4" in the last 168 hours.  BNPNo results for input(s): "BNP", "PROBNP" in the last 168 hours.  DDimer No results for input(s): "DDIMER" in the last 168 hours.   Radiology    No results found.  Cardiac Studies   Echocardiogram 07/14/2021: EF 65%  Patient Profile     88 y.o. female with CAD s/p CABG in 1996, HTN, HLD, DM, Diastolic CHF, morbid obesity who converted to atrial fib with RVR following PEA arrest on 08/16/22. She had been admitted on 08/10/22 with neurological issues (myelopathy and quadriparesis) requiring cervical decompression. 2 days later she had a PEA arrest felt to be due to polypharmacy. New onset atrial fibrillation 08/18/22. Cardiology  consulted for atrial fib with RVR. She was seen on 08/19/22 and her rate was controlled on Toprol 100 mg daily.   Assessment & Plan    Atrial fib, persistent:  -Currently on metoprolol XL 100 mg in the morning and 100 mg in the evening.  She was transitioned to Cardizem CD 120 mg once a day yesterday 08/25/22. She was increased rate this morning with minimal activity.  We will give her additional CD 120 now and then change to 240 tomorrow. BP allows.  Chronic anticoagulation -Now on Eliquis 5 mg twice a day.  Neurosurgery aware.  No changes made  Coronary artery disease -s/p remote CABG -denies any angina -continue ASA, statin and BB, no changes   Chronic diastolic CHF -appears euvolemic on exam today -BP controlled -continue Farxiga 10mg  daily, Lasix 20mg  daily, Toprol XL, lisinopril 20, Cardizem 240 mg long-acting.    For questions or updates, please contact [place] Please consult [email or web address] for contact info under        Signed, Candee Furbish, MD  08/26/2022, 10:28 AM

## 2022-08-26 NOTE — Progress Notes (Signed)
SLP Cancellation Note  Patient Details Name: Erika Gates MRN: [id number] DOB: [birth year]   Cancelled treatment:       Reason Eval/Treat Not Completed: Other (comment) (Patient fatigued but per daughter is tolerating regular solids, thin liquids without difficulty and has had a decent appetite. SLP to s/o at this time.)  Sonia Baller, MA, CCC-SLP Speech Therapy

## 2022-08-27 DIAGNOSIS — I5032 Chronic diastolic (congestive) heart failure: Secondary | ICD-10-CM | POA: Diagnosis not present

## 2022-08-27 DIAGNOSIS — I4510 Unspecified right bundle-branch block: Secondary | ICD-10-CM | POA: Diagnosis not present

## 2022-08-27 DIAGNOSIS — Z6838 Body mass index (BMI) 38.0-38.9, adult: Secondary | ICD-10-CM | POA: Diagnosis not present

## 2022-08-27 DIAGNOSIS — F419 Anxiety disorder, unspecified: Secondary | ICD-10-CM | POA: Diagnosis not present

## 2022-08-27 DIAGNOSIS — Z4789 Encounter for other orthopedic aftercare: Secondary | ICD-10-CM | POA: Diagnosis not present

## 2022-08-27 DIAGNOSIS — R29898 Other symptoms and signs involving the musculoskeletal system: Secondary | ICD-10-CM | POA: Diagnosis not present

## 2022-08-27 DIAGNOSIS — I10 Essential (primary) hypertension: Secondary | ICD-10-CM | POA: Diagnosis not present

## 2022-08-27 DIAGNOSIS — L89326 Pressure-induced deep tissue damage of left buttock: Secondary | ICD-10-CM | POA: Diagnosis not present

## 2022-08-27 DIAGNOSIS — M4802 Spinal stenosis, cervical region: Secondary | ICD-10-CM | POA: Diagnosis not present

## 2022-08-27 DIAGNOSIS — Z743 Need for continuous supervision: Secondary | ICD-10-CM | POA: Diagnosis not present

## 2022-08-27 DIAGNOSIS — R1310 Dysphagia, unspecified: Secondary | ICD-10-CM | POA: Diagnosis not present

## 2022-08-27 DIAGNOSIS — G9520 Unspecified cord compression: Secondary | ICD-10-CM | POA: Diagnosis not present

## 2022-08-27 DIAGNOSIS — E11610 Type 2 diabetes mellitus with diabetic neuropathic arthropathy: Secondary | ICD-10-CM | POA: Diagnosis not present

## 2022-08-27 DIAGNOSIS — J9601 Acute respiratory failure with hypoxia: Secondary | ICD-10-CM | POA: Diagnosis not present

## 2022-08-27 DIAGNOSIS — M25571 Pain in right ankle and joints of right foot: Secondary | ICD-10-CM | POA: Diagnosis not present

## 2022-08-27 DIAGNOSIS — E669 Obesity, unspecified: Secondary | ICD-10-CM | POA: Diagnosis not present

## 2022-08-27 DIAGNOSIS — I4819 Other persistent atrial fibrillation: Secondary | ICD-10-CM | POA: Diagnosis not present

## 2022-08-27 DIAGNOSIS — G959 Disease of spinal cord, unspecified: Secondary | ICD-10-CM | POA: Diagnosis not present

## 2022-08-27 DIAGNOSIS — I110 Hypertensive heart disease with heart failure: Secondary | ICD-10-CM | POA: Diagnosis not present

## 2022-08-27 DIAGNOSIS — I498 Other specified cardiac arrhythmias: Secondary | ICD-10-CM | POA: Diagnosis not present

## 2022-08-27 DIAGNOSIS — Z8679 Personal history of other diseases of the circulatory system: Secondary | ICD-10-CM | POA: Diagnosis not present

## 2022-08-27 LAB — BASIC METABOLIC PANEL
Anion gap: 8 (ref 5–15)
BUN: 20 mg/dL (ref 8–23)
CO2: 28 mmol/L (ref 22–32)
Calcium: 9 mg/dL (ref 8.9–10.3)
Chloride: 105 mmol/L (ref 98–111)
Creatinine, Ser: 0.96 mg/dL (ref 0.44–1.00)
GFR, Estimated: 58 mL/min — ABNORMAL LOW (ref >60)
Glucose, Bld: 117 mg/dL — ABNORMAL HIGH (ref 70–99)
Potassium: 3.2 mmol/L — ABNORMAL LOW (ref 3.5–5.1)
Sodium: 141 mmol/L (ref 135–145)

## 2022-08-27 LAB — GLUCOSE, CAPILLARY: Glucose-Capillary: 126 mg/dL — ABNORMAL HIGH (ref 70–99)

## 2022-08-27 MED ORDER — DILTIAZEM HCL ER COATED BEADS 240 MG PO CP24: 240.0000 mg | ORAL_CAPSULE | Freq: Every day | ORAL | 3 refills | Status: DC

## 2022-08-27 MED ORDER — INSULIN ASPART 100 UNIT/ML IJ SOLN: 0.0000 [IU] | Freq: Three times a day (TID) | INTRAMUSCULAR | 11 refills | Status: DC

## 2022-08-27 MED ORDER — SALINE SPRAY 0.65 % NA SOLN: 1.0000 | NASAL | 0 refills | Status: DC | PRN

## 2022-08-27 MED ORDER — PANTOPRAZOLE SODIUM 40 MG PO TBEC: 40.0000 mg | DELAYED_RELEASE_TABLET | Freq: Every day | ORAL | 3 refills | Status: DC

## 2022-08-27 MED ORDER — DICLOFENAC SODIUM 1 % TD GEL: 2.0000 g | Freq: Every day | TRANSDERMAL | Status: AC | PRN

## 2022-08-27 MED ORDER — POTASSIUM CHLORIDE CRYS ER 20 MEQ PO TBCR
40.0000 meq | EXTENDED_RELEASE_TABLET | Freq: Once | ORAL | Status: AC
  Administered 2022-08-27: 40 meq via ORAL
  Filled 2022-08-27: qty 2

## 2022-08-27 MED ORDER — DOCUSATE SODIUM 100 MG PO CAPS: 100.0000 mg | ORAL_CAPSULE | Freq: Two times a day (BID) | ORAL | 0 refills | Status: AC

## 2022-08-27 MED ORDER — APIXABAN 5 MG PO TABS: 5.0000 mg | ORAL_TABLET | Freq: Two times a day (BID) | ORAL | 3 refills | Status: DC

## 2022-08-27 MED ORDER — GUAIFENESIN 100 MG/5ML PO LIQD: 5.0000 mL | Freq: Four times a day (QID) | ORAL | 0 refills | Status: AC

## 2022-08-27 MED ORDER — FOLIC ACID 1 MG PO TABS: 1.0000 mg | ORAL_TABLET | Freq: Every day | ORAL | 3 refills | Status: DC

## 2022-08-27 MED ORDER — CLONAZEPAM 0.5 MG PO TBDP: 0.5000 mg | ORAL_TABLET | Freq: Two times a day (BID) | ORAL | 0 refills | Status: DC | PRN

## 2022-08-27 NOTE — Discharge Summary (Signed)
Physician Discharge Summary   Patient: Erika Gates MRN: [id number] DOB: 1935/02/16  Admit date:     08/10/2022  Discharge date: 08/27/22  Discharge Physician: Thad Ranger, MD    PCP: [name], MD   Recommendations at discharge:   Continue Cardizem 240 mg daily long-acting Continue Lasix 20 mg daily, Toprol-XL, Farxiga 10 mg daily, lisinopril 20 mg daily Started on Eliquis 5 mg twice daily Cardiology recommends to continue aspirin 81 mg daily due to history of coronary disease  Discharge Diagnoses:   Persistent atrial fibrillation with RVR, new onset Chronic diastolic CHF cardiac arrest Cervical stenosis of spinal canal status post cervical spine decompression on 12/22   Essential hypertension   History of coronary artery disease s/p CABG    Weakness of left lower extremity   Anxiety Obesity Deconditioning/debility Diabetes mellitus Acute kidney injury   Hospital Course: Patient is a 88 year old female with past medical history of coronary disease status post CABG, hypertension, hyperlipidemia, obesity who presented with progressive bilateral upper extremities, lower extremity weakness. She was found to have a spondylolysis with myelopathy, quadriparesis. Neurosurgery consulted and she underwent anterior cervical decompression, arthrodesis on with structural spacer and allograft anterior plate fixation Z3-G9 on 12/22.she had sudden onset of altered mental status and went into PEA arrest on 12/24. Had 7 minutes of resuscitation finally achieving ROSC. Transferred to ICU for further management and kept on vent. Patient transferred to [facility] service on 12/27. Hospital course remarkable for A-fib with RVR. PT recommending acute inpatient rehab on discharge   Assessment and Plan:  Cardiac arrest:  - Suspected to be secondary to polypharmacy induced respiratory depression after the cervical spine decompression surgery.  Had PEA arrest on 12/24 achieved ROSC after 7 minutes.  She  was transferred to ICU for further management, was placed on vent, now extubated and doing well -Vital signs currently stable, O2 sats 98% on room air. -At home she is on 1 to 2 L as needed at night  Persistent atrial fibrillation with RVR, new onset -Initially required Cardizem drip on 1/1, now on long-acting Cardizem and metoprolol -Cardizem increased to 240 mg daily long-acting today, continue beta-blocker -Patient was followed closely by cardiology, started on apixaban for anticoagulation CHADS2vasc score 6 .   Status post cervical spine decompression -Patient had a history of cervical spinal stenosis, bilateral upper extremity weakness.  CT scan showed severe multilevel degenerative disease, underwent decompression surgery on 12/22 -PT recommending acute inpatient rehab      History of coronary artery disease:  - Status post CABG.  No anginal symptoms.  On metoprolol, statin, aspirin  Essential hypertension -On metoprolol,  lisinopril at home. Amlodipine is stopped   AKI: Resolved -Creatinine 0.96 on discharge   Hypokalemia: -Corrected with potassium supplementation   Diabetes mellitus, type II, NIDDM -Placed on sliding scale insulin sensitive while inpatient -Hemoglobin A1c 6.2   Thrombocytopenia: Stable.   Morbid obesity: BMI 39.9   Deconditioning/debility: PT/OT commending acute inpatient rehab  Pressure injury Buttocks medial deep tissue pressure injury, wound care per instructions      Pain control - [place] Controlled Substance Reporting System database was reviewed. and patient was instructed, not to drive, operate heavy machinery, perform activities at heights, swimming or participation in water activities or provide baby-sitting services while on Pain, Sleep and Anxiety Medications; until their outpatient Physician has advised to do so again. Also recommended to not to take more than prescribed Pain, Sleep and Anxiety Medications.  Consultants:  Neurosurgery, critical care,  cardiology Procedures performed: S/P Anterior cervical decompression and arthrodesis with structural spacer and allograft anterior plate fixation A1-O8  Clinical ventilation, extubation Disposition: Rehabilitation facility Diet recommendation:  Discharge Diet Orders (From admission, onward)     Start     Ordered   08/27/22 0000  Diet - low sodium heart healthy        08/27/22 0840            DISCHARGE MEDICATION: Allergies as of 08/27/2022       Reactions   Metformin Other (See Comments)   REACTION: pt refuses METFORMIN "it made me sad"...        Medication List     STOP taking these medications    ALPRAZolam 0.5 MG tablet Commonly known as: XANAX   amLODipine 5 MG tablet Commonly known as: NORVASC   cyclobenzaprine 10 MG tablet Commonly known as: FLEXERIL       TAKE these medications    acetaminophen 500 MG tablet Commonly known as: TYLENOL Take 1,000 mg by mouth every 6 (six) hours as needed for mild pain.   apixaban 5 MG Tabs tablet Commonly known as: ELIQUIS Take 1 tablet (5 mg total) by mouth 2 (two) times daily.   aspirin EC 81 MG tablet Take 81 mg by mouth daily. Swallow whole.   clonazePAM 0.5 MG disintegrating tablet Commonly known as: KLONOPIN Take 1 tablet (0.5 mg total) by mouth 2 (two) times daily as needed (anxiety).   dapagliflozin propanediol 10 MG Tabs tablet Commonly known as: Farxiga Take 1 tablet (10 mg total) by mouth daily before breakfast.   diclofenac sodium 1 % Gel Commonly known as: VOLTAREN Apply 2 g topically daily as needed (pain).   diltiazem 240 MG 24 hr capsule Commonly known as: CARDIZEM CD Take 1 capsule (240 mg total) by mouth daily.   docusate sodium 100 MG capsule Commonly known as: COLACE Take 1 capsule (100 mg total) by mouth 2 (two) times daily.   folic acid 1 MG tablet Commonly known as: FOLVITE Take 1 tablet (1 mg total) by mouth daily.   furosemide 20 MG  tablet Commonly known as: Lasix Take 1 tablet (20 mg total) by mouth daily.   gabapentin 100 MG capsule Commonly known as: NEURONTIN Take 1 capsule (100 mg total) by mouth at bedtime. What changed:  when to take this Another medication with the same name was removed. Continue taking this medication, and follow the directions you see here.   guaiFENesin 100 MG/5ML liquid Commonly known as: ROBITUSSIN Take 5 mLs by mouth 4 (four) times daily for 6 days.   insulin aspart 100 UNIT/ML injection Commonly known as: novoLOG Inject 0-9 Units into the skin 3 (three) times daily with meals. Sliding scale CBG 70 - 120: 0 units CBG 121 - 150: 1 unit,  CBG 151 - 200: 2 units,  CBG 201 - 250: 3 units,  CBG 251 - 300: 5 units,  CBG 301 - 350: 7 units,  CBG 351 - 400: 9 units   CBG > 400: 9 units and notify your MD   lisinopril 20 MG tablet Commonly known as: ZESTRIL Take 1 tablet (20 mg total) by mouth daily.   metoprolol succinate 100 MG 24 hr tablet Commonly known as: TOPROL-XL Take 1 tablet (100 mg total) by mouth daily.   pantoprazole 40 MG tablet Commonly known as: PROTONIX Take 1 tablet (40 mg total) by mouth daily.   PROTEGRA CARDIO PO Take 1 capsule by mouth daily.  rosuvastatin 40 MG tablet Commonly known as: CRESTOR Take 40 mg by mouth daily.   sodium chloride 0.65 % Soln nasal spray Commonly known as: OCEAN Place 1 spray into both nostrils as needed for congestion.   Vitamin D 1000 units capsule Take 2,000 Units by mouth daily.   vitamin E 45 MG (100 UNITS) capsule Take 100 Units by mouth daily.               Discharge Care Instructions  (From admission, onward)           Start     Ordered   08/27/22 0000  Discharge wound care:       Comments: Wound care  Daily      Comments: Cleanse area with no rinse cleanser, cut Xeroform gauze [place] [phone]) to cover purple areas to buttocks and gluteal cleft, cover with foam.  Change Xeroform dressing daily, change  foam q3 days and prn soiling.   08/27/22 0840            Discharge Exam: Filed Weights   08/14/22 1215 08/18/22 0200  Weight: 103 kg 95.8 kg   S: No acute complaints, daughter at the bedside.  Heart rate elevated yesterday with PT and exertion.  Much better controlled with increase of Cardizem.  Looking forward to discharge today and starting rehab.  BP 127/74 (BP Location: Left Arm)   Pulse 90   Temp 97.8 F (36.6 C) (Oral)   Resp 20   Ht 5\' 1"  (1.549 m)   Wt 95.8 kg   SpO2 98%   BMI 39.91 kg/m   Physical Exam General: Alert and oriented x 3, NAD Cardiovascular: Irregularly irregular Respiratory: CTAB, no wheezing, rales or rhonchi Gastrointestinal: Soft, nontender, nondistended, NBS Ext: no pedal edema bilaterally Neuro: no new deficits Psych: Normal affect and demeanor, alert and oriented x3    Condition at discharge: fair  The results of significant diagnostics from this hospitalization (including imaging, microbiology, ancillary and laboratory) are listed below for reference.   Imaging Studies: DG Abd Portable 1V  Result Date: 08/16/2022 CLINICAL DATA:  OG tube placement EXAM: PORTABLE ABDOMEN - 1 VIEW COMPARISON:  None Available. FINDINGS: There is an enteric tube with tip and side port in the expected location of the distal stomach. The tube appears looped within the gastric fundus. Decreased aeration to the right lower lung is again noted compatible with atelectasis or infiltrate. IMPRESSION: Enteric tube tip and side port are in the expected location of the distal stomach. Electronically Signed   By: 08/18/2022 M.D.   On: 08/16/2022 09:17   DG CHEST PORT 1 VIEW  Result Date: 08/16/2022 CLINICAL DATA:  Intubation EXAM: PORTABLE CHEST 1 VIEW COMPARISON:  Six days ago FINDINGS: New endotracheal tube with tip just below the clavicular heads. Artifact from overlapping hardware. Cardiomegaly.  CABG. Airspace disease at the bases superimposed on the chronically  elevated right diaphragm. No effusion or pneumothorax. IMPRESSION: 1. Unremarkable endotracheal tube positioning. 2. Worsening aeration at the bases which could be atelectasis or infiltrate. Electronically Signed   By: 08/18/2022 M.D.   On: 08/16/2022 07:22   DG Cervical Spine 1 View  Result Date: 08/14/2022 CLINICAL DATA:  Anterior cervical fusion EXAM: DG CERVICAL SPINE - 1 VIEW COMPARISON:  None Available. FINDINGS: Single cross-table lateral x-ray of the cervical spine. Anterior cervical fusion from C3 through C6. Endotracheal tube noted. IMPRESSION: 1. Anterior cervical fusion from C3 through C6. Electronically Signed   By: 08/16/2022  Patel M.D.   On: 08/14/2022 15:11   DG Cervical Spine 2 or 3 views  Result Date: 08/14/2022 CLINICAL DATA:  Confirm correct needle placement. Call operating room with results. EXAM: CERVICAL SPINE - 2-3 VIEW COMPARISON:  MRI cervical spine 08/10/2022 FINDINGS: The atlantodens interval is intact. On the initial image, a needle overlies the anterior C4 vertebral body. There are bulky anterior C3-4 and C4-5 osteophytes. On the second image, a needle overlies the anterior C3-4 disc space. A second needle overlies the anterior C4-5 disc space. IMPRESSION: Localization of the anterior C3-4 and C4-5 disc spaces. Findings discussed with the surgeon Dr. Barnett Abu in the operating room at 9:44 a.m. on 08/14/2022. He verbally acknowledged these results. Electronically Signed   By: Neita Garnet M.D.   On: 08/14/2022 09:50   MR CERVICAL SPINE WO CONTRAST  Result Date: 08/10/2022 CLINICAL DATA:  Bilateral upper and lower extremity weakness EXAM: MRI CERVICAL SPINE WITHOUT CONTRAST TECHNIQUE: Multiplanar, multisequence MR imaging of the cervical spine was performed. No intravenous contrast was administered. COMPARISON:  No prior MRI of the cervical spine, correlation is made with CT cervical spine 03/04/2022 FINDINGS: Alignment: Reversal of the normal cervical lordosis. 2 mm  retrolisthesis C3 on C4. 4 mm anterolisthesis of C5 on C6. 2 mm anterolisthesis of C7 on T1. Vertebrae: No fracture, evidence of discitis, or bone lesion. Endplate degenerative changes, most prominent at C6-C7. Cord: Normal spinal cord signal.  Cord flattening at C3-C4. Posterior Fossa, vertebral arteries, paraspinal tissues: Empty sella. Normal vertebral artery flow voids. No acute finding in the paraspinal tissues. Disc levels: C2-C3: No significant disc bulge. Right greater than left facet arthropathy. No spinal canal stenosis or neural foraminal narrowing. C3-C4: Trace retrolisthesis and mild disc bulge. Facet and uncovertebral hypertrophy. Severe spinal canal stenosis with flattening of the spinal cord. Mild to moderate bilateral neural foraminal narrowing. C4-C5: Mild disc bulge with left paracentral disc extrusion with 5 mm of caudal migration, which indents and deforms the ventral spinal cord below the disc space. Mild spinal canal stenosis. Mild-to-moderate bilateral neural foraminal narrowing. C5-C6: Mild anterolisthesis with disc unroofing. Uncovertebral and facet arthropathy. Moderate spinal canal stenosis with some milder cord flattening. Mild bilateral neural foraminal narrowing. C6-C7: Disc height loss with minimal disc bulge. Facet and uncovertebral hypertrophy. Mild spinal canal stenosis. No neural foraminal narrowing. C7-T1: No significant disc bulge. No spinal canal stenosis or neuroforaminal narrowing. IMPRESSION: 1. C3-C4 severe spinal canal stenosis and mild-to-moderate bilateral neural foraminal narrowing. There is cord flattening at this level, without abnormal cord signal. 2. C4-C5 mild spinal canal stenosis and mild-to-moderate bilateral neural foraminal narrowing. 3. C5-C6 moderate spinal canal stenosis and mild bilateral neural foraminal narrowing. 4. C6-C7 mild spinal canal stenosis. Electronically Signed   By: Wiliam Ke M.D.   On: 08/10/2022 23:43   CT Head Wo Contrast  Result  Date: 08/10/2022 CLINICAL DATA:  Altered mental status EXAM: CT HEAD WITHOUT CONTRAST TECHNIQUE: Contiguous axial images were obtained from the base of the skull through the vertex without intravenous contrast. RADIATION DOSE REDUCTION: This exam was performed according to the departmental dose-optimization program which includes automated exposure control, adjustment of the mA and/or kV according to patient size and/or use of iterative reconstruction technique. COMPARISON:  03/04/2022 FINDINGS: Brain: No acute intracranial findings are seen. There are no signs of bleeding within the cranium. Ventricles are not dilated. Cortical sulci are prominent. There is 9 x 7 mm dense calcification adjacent to the inner table of right anterior frontal calvarium  close to midline with no significant change. There is no adjacent edema or mass effect. Vascular: Unremarkable. Skull: No fracture is seen. Sinuses/Orbits: Unremarkable. Other: Degenerative changes are noted in visualized upper cervical spine with posterior bony spurs causing extrinsic pressure over the ventral margin of thecal sac. IMPRESSION: No acute intracranial findings are seen in noncontrast CT brain. Atrophy. There is 9 x 7 mm dense calcification in the right anterior frontal region in the meninges, possibly small meningioma or dystrophic calcification. There is no adjacent edema or mass effect. Cervical spondylosis. Electronically Signed   By: Ernie Avena M.D.   On: 08/10/2022 17:41   DG Chest Port 1 View  Result Date: 08/10/2022 CLINICAL DATA:  weakness EXAM: PORTABLE CHEST 1 VIEW COMPARISON:  July 15, 2021 FINDINGS: The cardiomediastinal silhouette is unchanged in contour.Status post median sternotomy and CABG. Elevation of the RIGHT hemidiaphragm. Perihilar vascular fullness without overt edema. No pleural effusion. No pneumothorax. No acute pleuroparenchymal abnormality. IMPRESSION: Perihilar vascular fullness without overt pulmonary edema.  Electronically Signed   By: Meda Klinefelter M.D.   On: 08/10/2022 16:49   DG Knee Complete 4 Views Right  Result Date: 08/10/2022 CLINICAL DATA:  pain EXAM: RIGHT KNEE - COMPLETE 4 VIEW COMPARISON:  None Available. FINDINGS: No evidence of fracture, dislocation, or joint effusion. There is tricompartmental joint space narrowing, sclerosis and osteophytes consistent with degenerative joint disease. No evidence of effusion. IMPRESSION: Degenerative changes. Electronically Signed   By: Layla Maw M.D.   On: 08/10/2022 09:05   DG Shoulder Left  Result Date: 08/10/2022 CLINICAL DATA:  Pain EXAM: LEFT SHOULDER-3 VIEW COMPARISON:  None Available. FINDINGS: Loss of acromial humeral distance consistent with underlying rotator cuff defect. Glenohumeral and acromioclavicular degenerative changes with osteophytes, subchondral cyst formation and joint space narrowing. No acute fracture, dislocation or subluxation identified. IMPRESSION: Degenerative changes.  Possible rotator cuff defect. Electronically Signed   By: Layla Maw M.D.   On: 08/10/2022 09:04   DG Knee Complete 4 Views Left  Result Date: 08/10/2022 CLINICAL DATA:  pain EXAM: LEFT KNEE - COMPLETE 4 VIEW COMPARISON:  None Available. FINDINGS: No evidence of fracture, dislocation, or joint effusion. There is tricompartmental joint space narrowing, sclerosis and osteophytes consistent with degenerative joint disease. No evidence of effusion. IMPRESSION: Degenerative changes. Electronically Signed   By: Layla Maw M.D.   On: 08/10/2022 09:03    Microbiology: Results for orders placed or performed during the hospital encounter of 08/10/22  Resp panel by RT-PCR (RSV, Flu A&B, Covid) Anterior Nasal Swab     Status: None   Collection Time: 08/10/22  6:18 AM   Specimen: Anterior Nasal Swab  Result Value Ref Range Status   SARS Coronavirus 2 by RT PCR NEGATIVE NEGATIVE Final    Comment: (NOTE) SARS-CoV-2 target nucleic acids are NOT  DETECTED.  The SARS-CoV-2 RNA is generally detectable in upper respiratory specimens during the acute phase of infection. The lowest concentration of SARS-CoV-2 viral copies this assay can detect is 138 copies/mL. A negative result does not preclude SARS-Cov-2 infection and should not be used as the sole basis for treatment or other patient management decisions. A negative result may occur with  improper specimen collection/handling, submission of specimen other than nasopharyngeal swab, presence of viral mutation(s) within the areas targeted by this assay, and inadequate number of viral copies(<138 copies/mL). A negative result must be combined with clinical observations, patient history, and epidemiological information. The expected result is Negative.  Fact Sheet for Patients:  BloggerCourse.com  Fact Sheet for Healthcare Providers:  IncredibleEmployment.be  This test is no t yet approved or cleared by the [place] FDA and  has been authorized for detection and/or diagnosis of SARS-CoV-2 by FDA under an Emergency Use Authorization (EUA). This EUA will remain  in effect (meaning this test can be used) for the duration of the COVID-19 declaration under Section 564(b)(1) of the Act, 21 U.S.C.section 360bbb-3(b)(1), unless the authorization is terminated  or revoked sooner.       Influenza A by PCR NEGATIVE NEGATIVE Final   Influenza B by PCR NEGATIVE NEGATIVE Final    Comment: (NOTE) The Xpert Xpress SARS-CoV-2/FLU/RSV plus assay is intended as an aid in the diagnosis of influenza from Nasopharyngeal swab specimens and should not be used as a sole basis for treatment. Nasal washings and aspirates are unacceptable for Xpert Xpress SARS-CoV-2/FLU/RSV testing.  Fact Sheet for Patients: EntrepreneurPulse.com.au  Fact Sheet for Healthcare Providers: IncredibleEmployment.be  This test is not yet  approved or cleared by the [place] FDA and has been authorized for detection and/or diagnosis of SARS-CoV-2 by FDA under an Emergency Use Authorization (EUA). This EUA will remain in effect (meaning this test can be used) for the duration of the COVID-19 declaration under Section 564(b)(1) of the Act, 21 U.S.C. section 360bbb-3(b)(1), unless the authorization is terminated or revoked.     Resp Syncytial Virus by PCR NEGATIVE NEGATIVE Final    Comment: (NOTE) Fact Sheet for Patients: EntrepreneurPulse.com.au  Fact Sheet for Healthcare Providers: IncredibleEmployment.be  This test is not yet approved or cleared by the [place] FDA and has been authorized for detection and/or diagnosis of SARS-CoV-2 by FDA under an Emergency Use Authorization (EUA). This EUA will remain in effect (meaning this test can be used) for the duration of the COVID-19 declaration under Section 564(b)(1) of the Act, 21 U.S.C. section 360bbb-3(b)(1), unless the authorization is terminated or revoked.  Performed at [facility], [place] [address], [place], [place] [address]   MRSA Next Gen by PCR, Nasal     Status: None   Collection Time: 08/16/22  9:35 AM   Specimen: Nasal Mucosa; Nasal Swab  Result Value Ref Range Status   MRSA by PCR Next Gen NOT DETECTED NOT DETECTED Final    Comment: (NOTE) The GeneXpert MRSA Assay (FDA approved for NASAL specimens only), is one component of a comprehensive MRSA colonization surveillance program. It is not intended to diagnose MRSA infection nor to guide or monitor treatment for MRSA infections. Test performance is not FDA approved in patients less than 49 years old. Performed at [facility], [place] [address] [place], [place] [address]   SARS Coronavirus 2 by RT PCR (hospital order, performed in [facility] hospital lab) *cepheid single result test* Anterior Nasal Swab     Status: None   Collection Time:  08/25/22  5:13 PM   Specimen: Anterior Nasal Swab  Result Value Ref Range Status   SARS Coronavirus 2 by RT PCR NEGATIVE NEGATIVE Final    Comment: (NOTE) SARS-CoV-2 target nucleic acids are NOT DETECTED.  The SARS-CoV-2 RNA is generally detectable in upper and lower respiratory specimens during the acute phase of infection. The lowest concentration of SARS-CoV-2 viral copies this assay can detect is 250 copies / mL. A negative result does not preclude SARS-CoV-2 infection and should not be used as the sole basis for treatment or other patient management decisions.  A negative result may occur with improper specimen collection / handling,  submission of specimen other than nasopharyngeal swab, presence of viral mutation(s) within the areas targeted by this assay, and inadequate number of viral copies (<250 copies / mL). A negative result must be combined with clinical observations, patient history, and epidemiological information.  Fact Sheet for Patients:   RoadLapTop.co.za  Fact Sheet for Healthcare Providers: http://kim-miller.com/  This test is not yet approved or  cleared by the [place] FDA and has been authorized for detection and/or diagnosis of SARS-CoV-2 by FDA under an Emergency Use Authorization (EUA).  This EUA will remain in effect (meaning this test can be used) for the duration of the COVID-19 declaration under Section 564(b)(1) of the Act, 21 U.S.C. section 360bbb-3(b)(1), unless the authorization is terminated or revoked sooner.  Performed at [facility], [address], [place], [place] [address]     Labs: CBC: Recent Labs  Lab 08/21/22 0258 08/23/22 0232 08/25/22 0403  WBC 6.5 7.0 6.4  HGB 9.2* 9.6* 10.0*  HCT 31.0* 31.8* 32.0*  MCV 93.7 91.4 91.2  PLT 154 191 214   Basic Metabolic Panel: Recent Labs  Lab 08/21/22 0258 08/23/22 0232 08/24/22 0312 08/25/22 0403 08/27/22 0323  NA 143  141 142 140 141  K 4.4 3.1* 3.8 3.6 3.2*  CL 106 102 103 104 105  CO2 24 28 26 26 28   GLUCOSE 105* 113* 106* 111* 117*  BUN 15 17 16 17 20   CREATININE 0.79 0.86 0.85 0.85 0.96  CALCIUM 9.4 9.4 9.4 9.3 9.0   Liver Function Tests: No results for input(s): "AST", "ALT", "ALKPHOS", "BILITOT", "PROT", "ALBUMIN" in the last 168 hours. CBG: Recent Labs  Lab 08/26/22 0919 08/26/22 1152 08/26/22 1726 08/26/22 2113 08/27/22 0638  GLUCAP 109* 154* 141* 131* 126*    Discharge time spent: greater than 30 minutes.  Signed: 2114, MD [facility] 08/27/2022

## 2022-08-27 NOTE — TOC Transition Note (Signed)
Transition of Care [facility]) - CM/SW Discharge Note   Patient Details  Name: Erika Gates MRN: [id number] Date of Birth: 09/23/1934  Transition of Care [facility]) CM/SW Contact:  Pollie Friar, RN Phone Number: 08/27/2022, 8:48 AM   Clinical Narrative:    Pt is discharging to [facility] ([facility] today. She will transport via [employer]. Family at the bedside and updated. Will fax needed information to [facility]. D/c packet is at the desk.   Number for report: [phone] Accepting MD is Dr Launa Grill   Final next level of care: IP Rehab Facility Barriers to Discharge: No Barriers Identified   Patient Goals and CMS Choice CMS Medicare.gov Compare Post Acute Care list provided to:: Patient Choice offered to / list presented to : Patient, Adult Children  Discharge Placement                         Discharge Plan and Services Additional resources added to the After Visit Summary for   In-house Referral: Clinical Social Work Discharge Planning Services: CM Consult Post Acute Care Choice: [place]                               Social Determinants of Health (SDOH) Interventions SDOH Screenings   Food Insecurity: No Food Insecurity (08/11/2022)  Housing: Low Risk  (08/11/2022)  Transportation Needs: No Transportation Needs (08/11/2022)  Utilities: Not At Risk (08/11/2022)  Tobacco Use: Low Risk  (08/25/2022)     Readmission Risk Interventions     No data to display

## 2022-08-27 NOTE — Progress Notes (Addendum)
Report called to [facility] Nurse. All questions answered.

## 2022-09-07 ENCOUNTER — Ambulatory Visit: Payer: Medicare PPO | Admitting: Nurse Practitioner

## 2022-09-28 DIAGNOSIS — I2510 Atherosclerotic heart disease of native coronary artery without angina pectoris: Secondary | ICD-10-CM | POA: Diagnosis not present

## 2022-09-28 DIAGNOSIS — I110 Hypertensive heart disease with heart failure: Secondary | ICD-10-CM | POA: Diagnosis not present

## 2022-09-28 DIAGNOSIS — M21371 Foot drop, right foot: Secondary | ICD-10-CM | POA: Diagnosis not present

## 2022-09-28 DIAGNOSIS — I4819 Other persistent atrial fibrillation: Secondary | ICD-10-CM | POA: Diagnosis not present

## 2022-09-28 DIAGNOSIS — Z4789 Encounter for other orthopedic aftercare: Secondary | ICD-10-CM | POA: Diagnosis not present

## 2022-09-28 DIAGNOSIS — F419 Anxiety disorder, unspecified: Secondary | ICD-10-CM | POA: Diagnosis not present

## 2022-09-28 DIAGNOSIS — E11610 Type 2 diabetes mellitus with diabetic neuropathic arthropathy: Secondary | ICD-10-CM | POA: Diagnosis not present

## 2022-09-28 DIAGNOSIS — M4802 Spinal stenosis, cervical region: Secondary | ICD-10-CM | POA: Diagnosis not present

## 2022-09-28 DIAGNOSIS — I5033 Acute on chronic diastolic (congestive) heart failure: Secondary | ICD-10-CM | POA: Diagnosis not present

## 2022-10-01 DIAGNOSIS — Z4789 Encounter for other orthopedic aftercare: Secondary | ICD-10-CM | POA: Diagnosis not present

## 2022-10-01 DIAGNOSIS — I2510 Atherosclerotic heart disease of native coronary artery without angina pectoris: Secondary | ICD-10-CM | POA: Diagnosis not present

## 2022-10-01 DIAGNOSIS — E11610 Type 2 diabetes mellitus with diabetic neuropathic arthropathy: Secondary | ICD-10-CM | POA: Diagnosis not present

## 2022-10-01 DIAGNOSIS — F419 Anxiety disorder, unspecified: Secondary | ICD-10-CM | POA: Diagnosis not present

## 2022-10-01 DIAGNOSIS — I110 Hypertensive heart disease with heart failure: Secondary | ICD-10-CM | POA: Diagnosis not present

## 2022-10-01 DIAGNOSIS — I5033 Acute on chronic diastolic (congestive) heart failure: Secondary | ICD-10-CM | POA: Diagnosis not present

## 2022-10-01 DIAGNOSIS — M4802 Spinal stenosis, cervical region: Secondary | ICD-10-CM | POA: Diagnosis not present

## 2022-10-01 DIAGNOSIS — I4819 Other persistent atrial fibrillation: Secondary | ICD-10-CM | POA: Diagnosis not present

## 2022-10-01 DIAGNOSIS — M21371 Foot drop, right foot: Secondary | ICD-10-CM | POA: Diagnosis not present

## 2022-10-02 DIAGNOSIS — I110 Hypertensive heart disease with heart failure: Secondary | ICD-10-CM | POA: Diagnosis not present

## 2022-10-02 DIAGNOSIS — Z4789 Encounter for other orthopedic aftercare: Secondary | ICD-10-CM | POA: Diagnosis not present

## 2022-10-02 DIAGNOSIS — I2510 Atherosclerotic heart disease of native coronary artery without angina pectoris: Secondary | ICD-10-CM | POA: Diagnosis not present

## 2022-10-02 DIAGNOSIS — I4819 Other persistent atrial fibrillation: Secondary | ICD-10-CM | POA: Diagnosis not present

## 2022-10-02 DIAGNOSIS — F419 Anxiety disorder, unspecified: Secondary | ICD-10-CM | POA: Diagnosis not present

## 2022-10-02 DIAGNOSIS — I5033 Acute on chronic diastolic (congestive) heart failure: Secondary | ICD-10-CM | POA: Diagnosis not present

## 2022-10-02 DIAGNOSIS — E11610 Type 2 diabetes mellitus with diabetic neuropathic arthropathy: Secondary | ICD-10-CM | POA: Diagnosis not present

## 2022-10-02 DIAGNOSIS — M4802 Spinal stenosis, cervical region: Secondary | ICD-10-CM | POA: Diagnosis not present

## 2022-10-02 DIAGNOSIS — M21371 Foot drop, right foot: Secondary | ICD-10-CM | POA: Diagnosis not present

## 2022-10-05 DIAGNOSIS — I5033 Acute on chronic diastolic (congestive) heart failure: Secondary | ICD-10-CM | POA: Diagnosis not present

## 2022-10-05 DIAGNOSIS — I110 Hypertensive heart disease with heart failure: Secondary | ICD-10-CM | POA: Diagnosis not present

## 2022-10-05 DIAGNOSIS — I2510 Atherosclerotic heart disease of native coronary artery without angina pectoris: Secondary | ICD-10-CM | POA: Diagnosis not present

## 2022-10-05 DIAGNOSIS — E11610 Type 2 diabetes mellitus with diabetic neuropathic arthropathy: Secondary | ICD-10-CM | POA: Diagnosis not present

## 2022-10-05 DIAGNOSIS — I4819 Other persistent atrial fibrillation: Secondary | ICD-10-CM | POA: Diagnosis not present

## 2022-10-05 DIAGNOSIS — M21371 Foot drop, right foot: Secondary | ICD-10-CM | POA: Diagnosis not present

## 2022-10-05 DIAGNOSIS — M4802 Spinal stenosis, cervical region: Secondary | ICD-10-CM | POA: Diagnosis not present

## 2022-10-05 DIAGNOSIS — Z4789 Encounter for other orthopedic aftercare: Secondary | ICD-10-CM | POA: Diagnosis not present

## 2022-10-05 DIAGNOSIS — F419 Anxiety disorder, unspecified: Secondary | ICD-10-CM | POA: Diagnosis not present

## 2022-10-06 DIAGNOSIS — M4802 Spinal stenosis, cervical region: Secondary | ICD-10-CM | POA: Diagnosis not present

## 2022-10-06 DIAGNOSIS — M21371 Foot drop, right foot: Secondary | ICD-10-CM | POA: Diagnosis not present

## 2022-10-06 DIAGNOSIS — I2510 Atherosclerotic heart disease of native coronary artery without angina pectoris: Secondary | ICD-10-CM | POA: Diagnosis not present

## 2022-10-06 DIAGNOSIS — I4819 Other persistent atrial fibrillation: Secondary | ICD-10-CM | POA: Diagnosis not present

## 2022-10-06 DIAGNOSIS — I110 Hypertensive heart disease with heart failure: Secondary | ICD-10-CM | POA: Diagnosis not present

## 2022-10-06 DIAGNOSIS — E11610 Type 2 diabetes mellitus with diabetic neuropathic arthropathy: Secondary | ICD-10-CM | POA: Diagnosis not present

## 2022-10-06 DIAGNOSIS — Z4789 Encounter for other orthopedic aftercare: Secondary | ICD-10-CM | POA: Diagnosis not present

## 2022-10-06 DIAGNOSIS — I5033 Acute on chronic diastolic (congestive) heart failure: Secondary | ICD-10-CM | POA: Diagnosis not present

## 2022-10-06 DIAGNOSIS — F419 Anxiety disorder, unspecified: Secondary | ICD-10-CM | POA: Diagnosis not present

## 2022-10-07 DIAGNOSIS — I4819 Other persistent atrial fibrillation: Secondary | ICD-10-CM | POA: Diagnosis not present

## 2022-10-07 DIAGNOSIS — I110 Hypertensive heart disease with heart failure: Secondary | ICD-10-CM | POA: Diagnosis not present

## 2022-10-07 DIAGNOSIS — F419 Anxiety disorder, unspecified: Secondary | ICD-10-CM | POA: Diagnosis not present

## 2022-10-07 DIAGNOSIS — Z4789 Encounter for other orthopedic aftercare: Secondary | ICD-10-CM | POA: Diagnosis not present

## 2022-10-07 DIAGNOSIS — I2510 Atherosclerotic heart disease of native coronary artery without angina pectoris: Secondary | ICD-10-CM | POA: Diagnosis not present

## 2022-10-07 DIAGNOSIS — E11610 Type 2 diabetes mellitus with diabetic neuropathic arthropathy: Secondary | ICD-10-CM | POA: Diagnosis not present

## 2022-10-07 DIAGNOSIS — I5033 Acute on chronic diastolic (congestive) heart failure: Secondary | ICD-10-CM | POA: Diagnosis not present

## 2022-10-07 DIAGNOSIS — M21371 Foot drop, right foot: Secondary | ICD-10-CM | POA: Diagnosis not present

## 2022-10-07 DIAGNOSIS — M4802 Spinal stenosis, cervical region: Secondary | ICD-10-CM | POA: Diagnosis not present

## 2022-10-08 DIAGNOSIS — F419 Anxiety disorder, unspecified: Secondary | ICD-10-CM | POA: Diagnosis not present

## 2022-10-08 DIAGNOSIS — I4819 Other persistent atrial fibrillation: Secondary | ICD-10-CM | POA: Diagnosis not present

## 2022-10-08 DIAGNOSIS — M4802 Spinal stenosis, cervical region: Secondary | ICD-10-CM | POA: Diagnosis not present

## 2022-10-08 DIAGNOSIS — I5033 Acute on chronic diastolic (congestive) heart failure: Secondary | ICD-10-CM | POA: Diagnosis not present

## 2022-10-08 DIAGNOSIS — E11610 Type 2 diabetes mellitus with diabetic neuropathic arthropathy: Secondary | ICD-10-CM | POA: Diagnosis not present

## 2022-10-08 DIAGNOSIS — I110 Hypertensive heart disease with heart failure: Secondary | ICD-10-CM | POA: Diagnosis not present

## 2022-10-08 DIAGNOSIS — Z4789 Encounter for other orthopedic aftercare: Secondary | ICD-10-CM | POA: Diagnosis not present

## 2022-10-08 DIAGNOSIS — I2510 Atherosclerotic heart disease of native coronary artery without angina pectoris: Secondary | ICD-10-CM | POA: Diagnosis not present

## 2022-10-08 DIAGNOSIS — M21371 Foot drop, right foot: Secondary | ICD-10-CM | POA: Diagnosis not present

## 2022-10-09 DIAGNOSIS — I4819 Other persistent atrial fibrillation: Secondary | ICD-10-CM | POA: Diagnosis not present

## 2022-10-09 DIAGNOSIS — F419 Anxiety disorder, unspecified: Secondary | ICD-10-CM | POA: Diagnosis not present

## 2022-10-09 DIAGNOSIS — N1831 Chronic kidney disease, stage 3a: Secondary | ICD-10-CM | POA: Diagnosis not present

## 2022-10-09 DIAGNOSIS — M21371 Foot drop, right foot: Secondary | ICD-10-CM | POA: Diagnosis not present

## 2022-10-09 DIAGNOSIS — I5032 Chronic diastolic (congestive) heart failure: Secondary | ICD-10-CM | POA: Diagnosis not present

## 2022-10-09 DIAGNOSIS — F411 Generalized anxiety disorder: Secondary | ICD-10-CM | POA: Diagnosis not present

## 2022-10-09 DIAGNOSIS — M4802 Spinal stenosis, cervical region: Secondary | ICD-10-CM | POA: Diagnosis not present

## 2022-10-09 DIAGNOSIS — Z4789 Encounter for other orthopedic aftercare: Secondary | ICD-10-CM | POA: Diagnosis not present

## 2022-10-09 DIAGNOSIS — I2510 Atherosclerotic heart disease of native coronary artery without angina pectoris: Secondary | ICD-10-CM | POA: Diagnosis not present

## 2022-10-09 DIAGNOSIS — I480 Paroxysmal atrial fibrillation: Secondary | ICD-10-CM | POA: Diagnosis not present

## 2022-10-09 DIAGNOSIS — Z6841 Body Mass Index (BMI) 40.0 and over, adult: Secondary | ICD-10-CM | POA: Diagnosis not present

## 2022-10-09 DIAGNOSIS — I110 Hypertensive heart disease with heart failure: Secondary | ICD-10-CM | POA: Diagnosis not present

## 2022-10-09 DIAGNOSIS — E1122 Type 2 diabetes mellitus with diabetic chronic kidney disease: Secondary | ICD-10-CM | POA: Diagnosis not present

## 2022-10-09 DIAGNOSIS — I5033 Acute on chronic diastolic (congestive) heart failure: Secondary | ICD-10-CM | POA: Diagnosis not present

## 2022-10-09 DIAGNOSIS — E11610 Type 2 diabetes mellitus with diabetic neuropathic arthropathy: Secondary | ICD-10-CM | POA: Diagnosis not present

## 2022-10-09 DIAGNOSIS — M79606 Pain in leg, unspecified: Secondary | ICD-10-CM | POA: Diagnosis not present

## 2022-10-13 DIAGNOSIS — I2510 Atherosclerotic heart disease of native coronary artery without angina pectoris: Secondary | ICD-10-CM | POA: Diagnosis not present

## 2022-10-13 DIAGNOSIS — E11610 Type 2 diabetes mellitus with diabetic neuropathic arthropathy: Secondary | ICD-10-CM | POA: Diagnosis not present

## 2022-10-13 DIAGNOSIS — M4802 Spinal stenosis, cervical region: Secondary | ICD-10-CM | POA: Diagnosis not present

## 2022-10-13 DIAGNOSIS — Z4789 Encounter for other orthopedic aftercare: Secondary | ICD-10-CM | POA: Diagnosis not present

## 2022-10-13 DIAGNOSIS — I4819 Other persistent atrial fibrillation: Secondary | ICD-10-CM | POA: Diagnosis not present

## 2022-10-13 DIAGNOSIS — F419 Anxiety disorder, unspecified: Secondary | ICD-10-CM | POA: Diagnosis not present

## 2022-10-13 DIAGNOSIS — M21371 Foot drop, right foot: Secondary | ICD-10-CM | POA: Diagnosis not present

## 2022-10-13 DIAGNOSIS — I110 Hypertensive heart disease with heart failure: Secondary | ICD-10-CM | POA: Diagnosis not present

## 2022-10-13 DIAGNOSIS — I5033 Acute on chronic diastolic (congestive) heart failure: Secondary | ICD-10-CM | POA: Diagnosis not present

## 2022-10-14 DIAGNOSIS — Z4789 Encounter for other orthopedic aftercare: Secondary | ICD-10-CM | POA: Diagnosis not present

## 2022-10-14 DIAGNOSIS — F419 Anxiety disorder, unspecified: Secondary | ICD-10-CM | POA: Diagnosis not present

## 2022-10-14 DIAGNOSIS — I110 Hypertensive heart disease with heart failure: Secondary | ICD-10-CM | POA: Diagnosis not present

## 2022-10-14 DIAGNOSIS — M21371 Foot drop, right foot: Secondary | ICD-10-CM | POA: Diagnosis not present

## 2022-10-14 DIAGNOSIS — I2510 Atherosclerotic heart disease of native coronary artery without angina pectoris: Secondary | ICD-10-CM | POA: Diagnosis not present

## 2022-10-14 DIAGNOSIS — I5033 Acute on chronic diastolic (congestive) heart failure: Secondary | ICD-10-CM | POA: Diagnosis not present

## 2022-10-14 DIAGNOSIS — M4802 Spinal stenosis, cervical region: Secondary | ICD-10-CM | POA: Diagnosis not present

## 2022-10-14 DIAGNOSIS — I4819 Other persistent atrial fibrillation: Secondary | ICD-10-CM | POA: Diagnosis not present

## 2022-10-14 DIAGNOSIS — E11610 Type 2 diabetes mellitus with diabetic neuropathic arthropathy: Secondary | ICD-10-CM | POA: Diagnosis not present

## 2022-10-15 DIAGNOSIS — M21371 Foot drop, right foot: Secondary | ICD-10-CM | POA: Diagnosis not present

## 2022-10-15 DIAGNOSIS — M4802 Spinal stenosis, cervical region: Secondary | ICD-10-CM | POA: Diagnosis not present

## 2022-10-15 DIAGNOSIS — I110 Hypertensive heart disease with heart failure: Secondary | ICD-10-CM | POA: Diagnosis not present

## 2022-10-15 DIAGNOSIS — I2510 Atherosclerotic heart disease of native coronary artery without angina pectoris: Secondary | ICD-10-CM | POA: Diagnosis not present

## 2022-10-15 DIAGNOSIS — F419 Anxiety disorder, unspecified: Secondary | ICD-10-CM | POA: Diagnosis not present

## 2022-10-15 DIAGNOSIS — E11610 Type 2 diabetes mellitus with diabetic neuropathic arthropathy: Secondary | ICD-10-CM | POA: Diagnosis not present

## 2022-10-15 DIAGNOSIS — I5033 Acute on chronic diastolic (congestive) heart failure: Secondary | ICD-10-CM | POA: Diagnosis not present

## 2022-10-15 DIAGNOSIS — I4819 Other persistent atrial fibrillation: Secondary | ICD-10-CM | POA: Diagnosis not present

## 2022-10-15 DIAGNOSIS — Z4789 Encounter for other orthopedic aftercare: Secondary | ICD-10-CM | POA: Diagnosis not present

## 2022-10-15 MED FILL — Medication: Qty: 1 | Status: AC

## 2022-10-16 DIAGNOSIS — I5033 Acute on chronic diastolic (congestive) heart failure: Secondary | ICD-10-CM | POA: Diagnosis not present

## 2022-10-16 DIAGNOSIS — M4802 Spinal stenosis, cervical region: Secondary | ICD-10-CM | POA: Diagnosis not present

## 2022-10-16 DIAGNOSIS — F419 Anxiety disorder, unspecified: Secondary | ICD-10-CM | POA: Diagnosis not present

## 2022-10-16 DIAGNOSIS — I110 Hypertensive heart disease with heart failure: Secondary | ICD-10-CM | POA: Diagnosis not present

## 2022-10-16 DIAGNOSIS — I4819 Other persistent atrial fibrillation: Secondary | ICD-10-CM | POA: Diagnosis not present

## 2022-10-16 DIAGNOSIS — Z4789 Encounter for other orthopedic aftercare: Secondary | ICD-10-CM | POA: Diagnosis not present

## 2022-10-16 DIAGNOSIS — E11610 Type 2 diabetes mellitus with diabetic neuropathic arthropathy: Secondary | ICD-10-CM | POA: Diagnosis not present

## 2022-10-16 DIAGNOSIS — M21371 Foot drop, right foot: Secondary | ICD-10-CM | POA: Diagnosis not present

## 2022-10-16 DIAGNOSIS — I2510 Atherosclerotic heart disease of native coronary artery without angina pectoris: Secondary | ICD-10-CM | POA: Diagnosis not present

## 2022-10-20 NOTE — Progress Notes (Signed)
Cardiology Office Note:    Date:  10/27/2022   ID:  Erika Gates, DOB [birth year], MRN [id number]  PCP:  Donald Prose, Erika Gates Providers Cardiologist:  Sinclair Grooms, MD (Inactive)     Referring MD: Donald Prose, MD   Chief Complaint:  Hospitalization Follow-up     History of Present Illness:   Erika Gates is a 88 y.o. female with with CAD s/p CABG in 1996, HTN, HLD, DM, Diastolic CHF, morbid obesity who converted to atrial fib with RVR following PEA arrest on 08/16/22.   She had been admitted on 08/10/22 with neurological issues (myelopathy and quadriparesis) requiring cervical decompression. 2 days later she had a PEA arrest felt to be due to polypharmacy. New onset atrial fibrillation 08/18/22. Cardiology consulted for atrial fib with RVR. She was seen on 08/19/22 and her rate was controlled on Toprol 100 mg daily.   Patient comes in with her grandson. Lives at home with family. Getting PT and OT. She is doing better since the surgery. She was in [facility] for a month. No chest pain, palpitations, dyspnea. HR has been in the 40's recently.            Past Medical History:  Diagnosis Date   Allergic rhinitis    Anemia    Anxiety    Coronary atherosclerosis of unspecified type of vessel, native or graft    Diabetes mellitus    DJD (degenerative joint disease)    Hypercholesteremia    Hypertension    Low back pain    Morbid obesity (HCC)    Venous insufficiency    Vitamin D deficiency    Current Medications: Current Meds  Medication Sig   acetaminophen (TYLENOL) 500 MG tablet Take 1,000 mg by mouth every 6 (six) hours as needed for mild pain.   apixaban (ELIQUIS) 5 MG TABS tablet Take 1 tablet (5 mg total) by mouth 2 (two) times daily.   Cholecalciferol (VITAMIN D) 1000 UNITS capsule Take 2,000 Units by mouth daily.    clonazePAM (KLONOPIN) 0.5 MG disintegrating tablet Take 1 tablet (0.5 mg total) by mouth 2 (two) times daily as needed  (anxiety).   dapagliflozin propanediol (FARXIGA) 10 MG TABS tablet Take 1 tablet (10 mg total) by mouth daily before breakfast.   diclofenac sodium (VOLTAREN) 1 % GEL Apply 2 g topically daily as needed (pain).   docusate sodium (COLACE) 100 MG capsule Take 1 capsule (100 mg total) by mouth 2 (two) times daily.   folic acid (FOLVITE) 1 MG tablet Take 1 tablet (1 mg total) by mouth daily.   gabapentin (NEURONTIN) 100 MG capsule Take 1 capsule (100 mg total) by mouth at bedtime. (Patient taking differently: Take 100 mg by mouth in the morning.)   Multiple Vitamins-Minerals (PROTEGRA CARDIO PO) Take 1 capsule by mouth daily.     pantoprazole (PROTONIX) 40 MG tablet Take 1 tablet (40 mg total) by mouth daily.   rosuvastatin (CRESTOR) 40 MG tablet Take 40 mg by mouth daily.   sodium chloride (OCEAN) 0.65 % SOLN nasal spray Place 1 spray into both nostrils as needed for congestion.   vitamin E 100 UNIT capsule Take 100 Units by mouth daily.   [DISCONTINUED] aspirin EC 81 MG tablet Take 81 mg by mouth daily. Swallow whole.   [DISCONTINUED] metoprolol succinate (TOPROL-XL) 100 MG 24 hr tablet Take 1 tablet (100 mg total) by mouth daily. (Patient taking differently: Take 150 mg by mouth daily.)  Allergies:   Diltiazem and Metformin   Social History   Tobacco Use   Smoking status: Never   Smokeless tobacco: Never  Vaping Use   Vaping Use: Never used  Substance Use Topics   Alcohol use: No   Drug use: Never    Family Hx: The patient's family history includes Heart disease in her brother and mother.  ROS     Physical Exam:    VS:  BP 112/64   Pulse (!) 56   Ht '5\' 1"'$  (1.549 m)   Wt 210 lb (95.3 kg)   SpO2 93%   BMI 39.68 kg/m     Wt Readings from Last 3 Encounters:  10/27/22 210 lb (95.3 kg)  08/18/22 211 lb 3.2 oz (95.8 kg)  10/28/21 226 lb 12.8 oz (102.9 kg)    Physical Exam  GEN: Obese, in no acute distress  Neck: no JVD, carotid bruits, or masses Cardiac:RRR; no murmurs,  rubs, or gallops  Respiratory:  clear to auscultation bilaterally, normal work of breathing GI: soft, nontender, nondistended, + BS Ext: trace edema, without cyanosis, clubbing, Good distal pulses bilaterally Neuro:  Alert and Oriented x 3,  Psych: euthymic mood, full affect        EKGs/Labs/Other Test Reviewed:    EKG:  EKG is   ordered today.  The ekg ordered today demonstrates sinus bradycardia with RBBB  Recent Labs: 08/10/2022: TSH 1.783 08/18/2022: ALT 69 08/19/2022: Magnesium 2.5 08/25/2022: Hemoglobin 10.0; Platelets 214 08/27/2022: BUN 20; Creatinine, Ser 0.96; Potassium 3.2; Sodium 141   Recent Lipid Panel No results for input(s): "CHOL", "TRIG", "HDL", "VLDL", "LDLCALC", "LDLDIRECT" in the last 8760 hours.   Prior CV Studies:   Echo 06/2021 IMPRESSIONS     1. Left ventricular ejection fraction, by estimation, is 60 to 65%. The  left ventricle has normal function. The left ventricle has no regional  wall motion abnormalities. There is mild left ventricular hypertrophy.  Left ventricular diastolic parameters  are consistent with Grade I diastolic dysfunction (impaired relaxation).  Elevated left ventricular end-diastolic pressure. The E/e' is 77.   2. Right ventricular systolic function is mildly reduced. The right  ventricular size is normal. There is mildly elevated pulmonary artery  systolic pressure. The estimated right ventricular systolic pressure is  123456 mmHg.   3. The mitral valve is grossly normal. Trivial mitral valve  regurgitation.   4. The aortic valve is tricuspid. Aortic valve regurgitation is not  visualized.   5. The inferior vena cava is dilated in size with <50% respiratory  variability, suggesting right atrial pressure of 15 mmHg.   Comparison(s): No prior Echocardiogram.     Risk Assessment/Calculations/Metrics:    CHA2DS2-VASc Score = 7   This indicates a 11.2% annual risk of stroke. The patient's score is based upon: CHF History:  1 HTN History: 1 Diabetes History: 1 Stroke History: 0 Vascular Disease History: 1 Age Score: 2 Gender Score: 1             ASSESSMENT & PLAN:   No problem-specific Assessment & Plan notes found for this encounter.   Atrial fib, persistent:  -Currently on metoprolol XL 150 mg  and in sinus bradycardia 50's. Will decrease toprol 100 mg daily and monitor HR. Had a rash on diltiazem. Stop ASA on Eliquis. -labs done by [facility] 2 weeks ago stable.   Chronic anticoagulation -Now on Eliquis 5 mg twice a day.  Neurosurgery aware.  No changes made   Coronary artery disease -s/p remote  CABG -denies any angina -continue A statin and BB,  -stop ASA on Eliquis   Chronic diastolic CHF -appears euvolemic on exam today -BP controlled -continue [place] '10mg'$  daily, Lasix '20mg'$  daily, Toprol XL, lisinopril 20,                    Dispo:  No follow-ups on file.   Medication Adjustments/Labs and Tests Ordered: Current medicines are reviewed at length with the patient today.  Concerns regarding medicines are outlined above.  Tests Ordered: No orders of the defined types were placed in this encounter.  Medication Changes: Meds ordered this encounter  Medications   metoprolol succinate (TOPROL-XL) 100 MG 24 hr tablet    Sig: Take 1 tablet (100 mg total) by mouth daily.    Dispense:  90 tablet    Refill:  3   Signed, Ermalinda Barrios, PA-C  10/27/2022 1:06 PM    [address], [place], [place]  [address] Phone: [phone]; Fax: [phone]

## 2022-10-21 DIAGNOSIS — J9601 Acute respiratory failure with hypoxia: Secondary | ICD-10-CM | POA: Diagnosis not present

## 2022-10-21 DIAGNOSIS — Z4789 Encounter for other orthopedic aftercare: Secondary | ICD-10-CM | POA: Diagnosis not present

## 2022-10-21 DIAGNOSIS — I110 Hypertensive heart disease with heart failure: Secondary | ICD-10-CM | POA: Diagnosis not present

## 2022-10-21 DIAGNOSIS — M4802 Spinal stenosis, cervical region: Secondary | ICD-10-CM | POA: Diagnosis not present

## 2022-10-21 DIAGNOSIS — F419 Anxiety disorder, unspecified: Secondary | ICD-10-CM | POA: Diagnosis not present

## 2022-10-21 DIAGNOSIS — I2510 Atherosclerotic heart disease of native coronary artery without angina pectoris: Secondary | ICD-10-CM | POA: Diagnosis not present

## 2022-10-21 DIAGNOSIS — I5033 Acute on chronic diastolic (congestive) heart failure: Secondary | ICD-10-CM | POA: Diagnosis not present

## 2022-10-21 DIAGNOSIS — I4819 Other persistent atrial fibrillation: Secondary | ICD-10-CM | POA: Diagnosis not present

## 2022-10-21 DIAGNOSIS — E11610 Type 2 diabetes mellitus with diabetic neuropathic arthropathy: Secondary | ICD-10-CM | POA: Diagnosis not present

## 2022-10-21 DIAGNOSIS — M21371 Foot drop, right foot: Secondary | ICD-10-CM | POA: Diagnosis not present

## 2022-10-22 DIAGNOSIS — I4819 Other persistent atrial fibrillation: Secondary | ICD-10-CM | POA: Diagnosis not present

## 2022-10-22 DIAGNOSIS — M21371 Foot drop, right foot: Secondary | ICD-10-CM | POA: Diagnosis not present

## 2022-10-22 DIAGNOSIS — F419 Anxiety disorder, unspecified: Secondary | ICD-10-CM | POA: Diagnosis not present

## 2022-10-22 DIAGNOSIS — E11610 Type 2 diabetes mellitus with diabetic neuropathic arthropathy: Secondary | ICD-10-CM | POA: Diagnosis not present

## 2022-10-22 DIAGNOSIS — I110 Hypertensive heart disease with heart failure: Secondary | ICD-10-CM | POA: Diagnosis not present

## 2022-10-22 DIAGNOSIS — I5033 Acute on chronic diastolic (congestive) heart failure: Secondary | ICD-10-CM | POA: Diagnosis not present

## 2022-10-22 DIAGNOSIS — I2510 Atherosclerotic heart disease of native coronary artery without angina pectoris: Secondary | ICD-10-CM | POA: Diagnosis not present

## 2022-10-22 DIAGNOSIS — Z4789 Encounter for other orthopedic aftercare: Secondary | ICD-10-CM | POA: Diagnosis not present

## 2022-10-22 DIAGNOSIS — M4802 Spinal stenosis, cervical region: Secondary | ICD-10-CM | POA: Diagnosis not present

## 2022-10-23 DIAGNOSIS — J9601 Acute respiratory failure with hypoxia: Secondary | ICD-10-CM | POA: Diagnosis not present

## 2022-10-23 DIAGNOSIS — E11610 Type 2 diabetes mellitus with diabetic neuropathic arthropathy: Secondary | ICD-10-CM | POA: Diagnosis not present

## 2022-10-23 DIAGNOSIS — I5033 Acute on chronic diastolic (congestive) heart failure: Secondary | ICD-10-CM | POA: Diagnosis not present

## 2022-10-23 DIAGNOSIS — I4819 Other persistent atrial fibrillation: Secondary | ICD-10-CM | POA: Diagnosis not present

## 2022-10-23 DIAGNOSIS — I2510 Atherosclerotic heart disease of native coronary artery without angina pectoris: Secondary | ICD-10-CM | POA: Diagnosis not present

## 2022-10-23 DIAGNOSIS — M21371 Foot drop, right foot: Secondary | ICD-10-CM | POA: Diagnosis not present

## 2022-10-23 DIAGNOSIS — M4802 Spinal stenosis, cervical region: Secondary | ICD-10-CM | POA: Diagnosis not present

## 2022-10-23 DIAGNOSIS — F419 Anxiety disorder, unspecified: Secondary | ICD-10-CM | POA: Diagnosis not present

## 2022-10-23 DIAGNOSIS — G959 Disease of spinal cord, unspecified: Secondary | ICD-10-CM | POA: Diagnosis not present

## 2022-10-23 DIAGNOSIS — Z4789 Encounter for other orthopedic aftercare: Secondary | ICD-10-CM | POA: Diagnosis not present

## 2022-10-23 DIAGNOSIS — I110 Hypertensive heart disease with heart failure: Secondary | ICD-10-CM | POA: Diagnosis not present

## 2022-10-27 ENCOUNTER — Encounter: Payer: Self-pay | Admitting: Physician Assistant

## 2022-10-27 ENCOUNTER — Ambulatory Visit: Payer: Medicare PPO | Attending: Nurse Practitioner | Admitting: Physician Assistant

## 2022-10-27 VITALS — BP 112/64 | HR 56 | Ht 61.0 in | Wt 210.0 lb

## 2022-10-27 DIAGNOSIS — I2510 Atherosclerotic heart disease of native coronary artery without angina pectoris: Secondary | ICD-10-CM | POA: Diagnosis not present

## 2022-10-27 DIAGNOSIS — Z7901 Long term (current) use of anticoagulants: Secondary | ICD-10-CM

## 2022-10-27 DIAGNOSIS — I4819 Other persistent atrial fibrillation: Secondary | ICD-10-CM | POA: Diagnosis not present

## 2022-10-27 DIAGNOSIS — I5032 Chronic diastolic (congestive) heart failure: Secondary | ICD-10-CM | POA: Diagnosis not present

## 2022-10-27 MED ORDER — METOPROLOL SUCCINATE ER 100 MG PO TB24: 100.0000 mg | ORAL_TABLET | Freq: Every day | ORAL | 3 refills | Status: DC

## 2022-10-27 NOTE — Patient Instructions (Signed)
Medication Instructions:  Decrease Metoprolol to 100 mg daily  Stop Aspirin   *If you need a refill on your cardiac medications before your next appointment, please call your pharmacy*   Lab Work: None ordered   If you have labs (blood work) drawn today and your tests are completely normal, you will receive your results only by: [place] (if you have MyChart) OR A paper copy in the mail If you have any lab test that is abnormal or we need to change your treatment, we will call you to review the results.   Testing/Procedures: None ordered    Follow-Up: Follow up as scheduled   Other Instructions

## 2022-10-27 NOTE — Addendum Note (Signed)
Addended by: Michelle Nasuti on: 10/27/2022 01:12 PM   Modules accepted: Orders

## 2022-10-28 DIAGNOSIS — M4802 Spinal stenosis, cervical region: Secondary | ICD-10-CM | POA: Diagnosis not present

## 2022-10-28 DIAGNOSIS — E11610 Type 2 diabetes mellitus with diabetic neuropathic arthropathy: Secondary | ICD-10-CM | POA: Diagnosis not present

## 2022-10-28 DIAGNOSIS — Z4789 Encounter for other orthopedic aftercare: Secondary | ICD-10-CM | POA: Diagnosis not present

## 2022-10-28 DIAGNOSIS — I4819 Other persistent atrial fibrillation: Secondary | ICD-10-CM | POA: Diagnosis not present

## 2022-10-28 DIAGNOSIS — I5033 Acute on chronic diastolic (congestive) heart failure: Secondary | ICD-10-CM | POA: Diagnosis not present

## 2022-10-28 DIAGNOSIS — F419 Anxiety disorder, unspecified: Secondary | ICD-10-CM | POA: Diagnosis not present

## 2022-10-28 DIAGNOSIS — M21371 Foot drop, right foot: Secondary | ICD-10-CM | POA: Diagnosis not present

## 2022-10-28 DIAGNOSIS — I2510 Atherosclerotic heart disease of native coronary artery without angina pectoris: Secondary | ICD-10-CM | POA: Diagnosis not present

## 2022-10-28 DIAGNOSIS — I110 Hypertensive heart disease with heart failure: Secondary | ICD-10-CM | POA: Diagnosis not present

## 2022-10-29 DIAGNOSIS — E11610 Type 2 diabetes mellitus with diabetic neuropathic arthropathy: Secondary | ICD-10-CM | POA: Diagnosis not present

## 2022-10-29 DIAGNOSIS — I5033 Acute on chronic diastolic (congestive) heart failure: Secondary | ICD-10-CM | POA: Diagnosis not present

## 2022-10-29 DIAGNOSIS — I110 Hypertensive heart disease with heart failure: Secondary | ICD-10-CM | POA: Diagnosis not present

## 2022-10-29 DIAGNOSIS — I2510 Atherosclerotic heart disease of native coronary artery without angina pectoris: Secondary | ICD-10-CM | POA: Diagnosis not present

## 2022-10-29 DIAGNOSIS — F419 Anxiety disorder, unspecified: Secondary | ICD-10-CM | POA: Diagnosis not present

## 2022-10-29 DIAGNOSIS — M21371 Foot drop, right foot: Secondary | ICD-10-CM | POA: Diagnosis not present

## 2022-10-29 DIAGNOSIS — Z4789 Encounter for other orthopedic aftercare: Secondary | ICD-10-CM | POA: Diagnosis not present

## 2022-10-29 DIAGNOSIS — M4802 Spinal stenosis, cervical region: Secondary | ICD-10-CM | POA: Diagnosis not present

## 2022-10-29 DIAGNOSIS — I4819 Other persistent atrial fibrillation: Secondary | ICD-10-CM | POA: Diagnosis not present

## 2022-10-30 DIAGNOSIS — F419 Anxiety disorder, unspecified: Secondary | ICD-10-CM | POA: Diagnosis not present

## 2022-10-30 DIAGNOSIS — I4819 Other persistent atrial fibrillation: Secondary | ICD-10-CM | POA: Diagnosis not present

## 2022-10-30 DIAGNOSIS — Z4789 Encounter for other orthopedic aftercare: Secondary | ICD-10-CM | POA: Diagnosis not present

## 2022-10-30 DIAGNOSIS — I110 Hypertensive heart disease with heart failure: Secondary | ICD-10-CM | POA: Diagnosis not present

## 2022-10-30 DIAGNOSIS — M21371 Foot drop, right foot: Secondary | ICD-10-CM | POA: Diagnosis not present

## 2022-10-30 DIAGNOSIS — E11610 Type 2 diabetes mellitus with diabetic neuropathic arthropathy: Secondary | ICD-10-CM | POA: Diagnosis not present

## 2022-10-30 DIAGNOSIS — I5033 Acute on chronic diastolic (congestive) heart failure: Secondary | ICD-10-CM | POA: Diagnosis not present

## 2022-10-30 DIAGNOSIS — I2510 Atherosclerotic heart disease of native coronary artery without angina pectoris: Secondary | ICD-10-CM | POA: Diagnosis not present

## 2022-10-30 DIAGNOSIS — M4802 Spinal stenosis, cervical region: Secondary | ICD-10-CM | POA: Diagnosis not present

## 2022-11-04 DIAGNOSIS — I110 Hypertensive heart disease with heart failure: Secondary | ICD-10-CM | POA: Diagnosis not present

## 2022-11-04 DIAGNOSIS — I2510 Atherosclerotic heart disease of native coronary artery without angina pectoris: Secondary | ICD-10-CM | POA: Diagnosis not present

## 2022-11-04 DIAGNOSIS — M4802 Spinal stenosis, cervical region: Secondary | ICD-10-CM | POA: Diagnosis not present

## 2022-11-04 DIAGNOSIS — M21371 Foot drop, right foot: Secondary | ICD-10-CM | POA: Diagnosis not present

## 2022-11-04 DIAGNOSIS — I4819 Other persistent atrial fibrillation: Secondary | ICD-10-CM | POA: Diagnosis not present

## 2022-11-04 DIAGNOSIS — E11610 Type 2 diabetes mellitus with diabetic neuropathic arthropathy: Secondary | ICD-10-CM | POA: Diagnosis not present

## 2022-11-04 DIAGNOSIS — Z4789 Encounter for other orthopedic aftercare: Secondary | ICD-10-CM | POA: Diagnosis not present

## 2022-11-04 DIAGNOSIS — I5033 Acute on chronic diastolic (congestive) heart failure: Secondary | ICD-10-CM | POA: Diagnosis not present

## 2022-11-04 DIAGNOSIS — F419 Anxiety disorder, unspecified: Secondary | ICD-10-CM | POA: Diagnosis not present

## 2022-11-06 DIAGNOSIS — Z4789 Encounter for other orthopedic aftercare: Secondary | ICD-10-CM | POA: Diagnosis not present

## 2022-11-06 DIAGNOSIS — E11610 Type 2 diabetes mellitus with diabetic neuropathic arthropathy: Secondary | ICD-10-CM | POA: Diagnosis not present

## 2022-11-06 DIAGNOSIS — F419 Anxiety disorder, unspecified: Secondary | ICD-10-CM | POA: Diagnosis not present

## 2022-11-06 DIAGNOSIS — I110 Hypertensive heart disease with heart failure: Secondary | ICD-10-CM | POA: Diagnosis not present

## 2022-11-06 DIAGNOSIS — I4819 Other persistent atrial fibrillation: Secondary | ICD-10-CM | POA: Diagnosis not present

## 2022-11-06 DIAGNOSIS — I5033 Acute on chronic diastolic (congestive) heart failure: Secondary | ICD-10-CM | POA: Diagnosis not present

## 2022-11-06 DIAGNOSIS — M4802 Spinal stenosis, cervical region: Secondary | ICD-10-CM | POA: Diagnosis not present

## 2022-11-06 DIAGNOSIS — I2510 Atherosclerotic heart disease of native coronary artery without angina pectoris: Secondary | ICD-10-CM | POA: Diagnosis not present

## 2022-11-06 DIAGNOSIS — M21371 Foot drop, right foot: Secondary | ICD-10-CM | POA: Diagnosis not present

## 2022-11-11 DIAGNOSIS — I4819 Other persistent atrial fibrillation: Secondary | ICD-10-CM | POA: Diagnosis not present

## 2022-11-11 DIAGNOSIS — E11610 Type 2 diabetes mellitus with diabetic neuropathic arthropathy: Secondary | ICD-10-CM | POA: Diagnosis not present

## 2022-11-11 DIAGNOSIS — I110 Hypertensive heart disease with heart failure: Secondary | ICD-10-CM | POA: Diagnosis not present

## 2022-11-11 DIAGNOSIS — I2510 Atherosclerotic heart disease of native coronary artery without angina pectoris: Secondary | ICD-10-CM | POA: Diagnosis not present

## 2022-11-11 DIAGNOSIS — M4802 Spinal stenosis, cervical region: Secondary | ICD-10-CM | POA: Diagnosis not present

## 2022-11-11 DIAGNOSIS — I5033 Acute on chronic diastolic (congestive) heart failure: Secondary | ICD-10-CM | POA: Diagnosis not present

## 2022-11-11 DIAGNOSIS — Z4789 Encounter for other orthopedic aftercare: Secondary | ICD-10-CM | POA: Diagnosis not present

## 2022-11-11 DIAGNOSIS — F419 Anxiety disorder, unspecified: Secondary | ICD-10-CM | POA: Diagnosis not present

## 2022-11-11 DIAGNOSIS — M21371 Foot drop, right foot: Secondary | ICD-10-CM | POA: Diagnosis not present

## 2022-11-13 DIAGNOSIS — I5033 Acute on chronic diastolic (congestive) heart failure: Secondary | ICD-10-CM | POA: Diagnosis not present

## 2022-11-13 DIAGNOSIS — Z4789 Encounter for other orthopedic aftercare: Secondary | ICD-10-CM | POA: Diagnosis not present

## 2022-11-13 DIAGNOSIS — E11610 Type 2 diabetes mellitus with diabetic neuropathic arthropathy: Secondary | ICD-10-CM | POA: Diagnosis not present

## 2022-11-13 DIAGNOSIS — I4819 Other persistent atrial fibrillation: Secondary | ICD-10-CM | POA: Diagnosis not present

## 2022-11-13 DIAGNOSIS — I110 Hypertensive heart disease with heart failure: Secondary | ICD-10-CM | POA: Diagnosis not present

## 2022-11-13 DIAGNOSIS — F419 Anxiety disorder, unspecified: Secondary | ICD-10-CM | POA: Diagnosis not present

## 2022-11-13 DIAGNOSIS — M21371 Foot drop, right foot: Secondary | ICD-10-CM | POA: Diagnosis not present

## 2022-11-13 DIAGNOSIS — M4802 Spinal stenosis, cervical region: Secondary | ICD-10-CM | POA: Diagnosis not present

## 2022-11-13 DIAGNOSIS — I2510 Atherosclerotic heart disease of native coronary artery without angina pectoris: Secondary | ICD-10-CM | POA: Diagnosis not present

## 2022-11-19 DIAGNOSIS — J9601 Acute respiratory failure with hypoxia: Secondary | ICD-10-CM | POA: Diagnosis not present

## 2022-11-20 DIAGNOSIS — I2510 Atherosclerotic heart disease of native coronary artery without angina pectoris: Secondary | ICD-10-CM | POA: Diagnosis not present

## 2022-11-20 DIAGNOSIS — E11610 Type 2 diabetes mellitus with diabetic neuropathic arthropathy: Secondary | ICD-10-CM | POA: Diagnosis not present

## 2022-11-20 DIAGNOSIS — I110 Hypertensive heart disease with heart failure: Secondary | ICD-10-CM | POA: Diagnosis not present

## 2022-11-20 DIAGNOSIS — M4802 Spinal stenosis, cervical region: Secondary | ICD-10-CM | POA: Diagnosis not present

## 2022-11-20 DIAGNOSIS — I4819 Other persistent atrial fibrillation: Secondary | ICD-10-CM | POA: Diagnosis not present

## 2022-11-20 DIAGNOSIS — F419 Anxiety disorder, unspecified: Secondary | ICD-10-CM | POA: Diagnosis not present

## 2022-11-20 DIAGNOSIS — Z4789 Encounter for other orthopedic aftercare: Secondary | ICD-10-CM | POA: Diagnosis not present

## 2022-11-20 DIAGNOSIS — M21371 Foot drop, right foot: Secondary | ICD-10-CM | POA: Diagnosis not present

## 2022-11-20 DIAGNOSIS — I5033 Acute on chronic diastolic (congestive) heart failure: Secondary | ICD-10-CM | POA: Diagnosis not present

## 2022-11-23 ENCOUNTER — Telehealth: Payer: Self-pay | Admitting: Physician Assistant

## 2022-11-23 DIAGNOSIS — I4819 Other persistent atrial fibrillation: Secondary | ICD-10-CM

## 2022-11-23 DIAGNOSIS — J9601 Acute respiratory failure with hypoxia: Secondary | ICD-10-CM | POA: Diagnosis not present

## 2022-11-23 DIAGNOSIS — G959 Disease of spinal cord, unspecified: Secondary | ICD-10-CM | POA: Diagnosis not present

## 2022-11-23 MED ORDER — APIXABAN 5 MG PO TABS: 5.0000 mg | ORAL_TABLET | Freq: Two times a day (BID) | ORAL | 1 refills | Status: DC

## 2022-11-23 NOTE — Telephone Encounter (Signed)
Eliquis 5mg  refill request received. Patient is 88 years old, weight-95.3kg, Crea-0.96 on 08/27/22, Diagnosis-Afib, and last seen by Ermalinda Barrios on 3/524. Dose is appropriate based on dosing criteria. Will send in refill to requested pharmacy.

## 2022-11-23 NOTE — Telephone Encounter (Signed)
*  STAT* If patient is at the pharmacy, call can be transferred to refill team.   1. Which medications need to be refilled? (please list name of each medication and dose if known) apixaban (ELIQUIS) 5 MG TABS tablet   2. Which pharmacy/location (including street and city if local pharmacy) is medication to be sent to? [id number] - [place], [place] - [address] AT [place]   3. Do they need a 30 day or 90 day supply? 90 day   Patient has 4 tablets left.

## 2022-12-02 DIAGNOSIS — M2141 Flat foot [pes planus] (acquired), right foot: Secondary | ICD-10-CM | POA: Diagnosis not present

## 2022-12-17 DIAGNOSIS — M1712 Unilateral primary osteoarthritis, left knee: Secondary | ICD-10-CM | POA: Diagnosis not present

## 2022-12-20 DIAGNOSIS — J9601 Acute respiratory failure with hypoxia: Secondary | ICD-10-CM | POA: Diagnosis not present

## 2022-12-23 DIAGNOSIS — G959 Disease of spinal cord, unspecified: Secondary | ICD-10-CM | POA: Diagnosis not present

## 2022-12-25 DIAGNOSIS — M1711 Unilateral primary osteoarthritis, right knee: Secondary | ICD-10-CM | POA: Diagnosis not present

## 2022-12-25 DIAGNOSIS — M1712 Unilateral primary osteoarthritis, left knee: Secondary | ICD-10-CM | POA: Diagnosis not present

## 2023-01-12 ENCOUNTER — Encounter: Payer: Self-pay | Admitting: Cardiology

## 2023-01-12 ENCOUNTER — Ambulatory Visit: Payer: Medicare PPO | Attending: Cardiology | Admitting: Cardiology

## 2023-01-12 VITALS — BP 140/60 | HR 60 | Ht 60.0 in | Wt 210.0 lb

## 2023-01-12 DIAGNOSIS — Z7901 Long term (current) use of anticoagulants: Secondary | ICD-10-CM

## 2023-01-12 DIAGNOSIS — E785 Hyperlipidemia, unspecified: Secondary | ICD-10-CM

## 2023-01-12 DIAGNOSIS — I4819 Other persistent atrial fibrillation: Secondary | ICD-10-CM

## 2023-01-12 DIAGNOSIS — I2510 Atherosclerotic heart disease of native coronary artery without angina pectoris: Secondary | ICD-10-CM | POA: Diagnosis not present

## 2023-01-12 DIAGNOSIS — I5032 Chronic diastolic (congestive) heart failure: Secondary | ICD-10-CM | POA: Diagnosis not present

## 2023-01-12 MED ORDER — ROSUVASTATIN CALCIUM 20 MG PO TABS: 20.0000 mg | ORAL_TABLET | Freq: Every day | ORAL | 3 refills | Status: AC

## 2023-01-12 NOTE — Progress Notes (Signed)
Cardiology Office Note:    Date:  01/12/2023   ID:  Erika Gates, DOB Dec 15, 1934, MRN [id number]  PCP:  [name], MD   [facility] Providers Cardiologist:  Lesleigh Noe, MD (Inactive)     Referring MD: [name], MD    History of Present Illness:    Erika Gates is a 88 y.o. female former patient of Dr. Sherilyn Cooter Smith's with coronary artery disease status post CABG in 1996 with chronic diastolic heart failure hypertension hyperlipidemia and diabetes with atrial fibrillation paroxysmal following PEA arrest in August 16, 2022.  She been admitted in December 2023 with neurologic issues myopathy quadriparesis requiring cervical decompression.  2 days after she had PEA arrest.  New onset atrial fibrillation December 26.  Cardiology was consulted for A-fib RVR.  She was rate controlled on Toprol 100.  Lives at home with family PT OT was in [facility] for a month.  Heart rate had been in the 40s at 1 point.  Past Medical History:  Diagnosis Date   Allergic rhinitis    Anemia    Anxiety    Coronary atherosclerosis of unspecified type of vessel, native or graft    Diabetes mellitus    DJD (degenerative joint disease)    Hypercholesteremia    Hypertension    Low back pain    Morbid obesity (HCC)    Venous insufficiency    Vitamin D deficiency     Past Surgical History:  Procedure Laterality Date   ABDOMINAL HYSTERECTOMY     ANTERIOR CERVICAL DECOMP/DISCECTOMY FUSION N/A 08/14/2022   Procedure: Cerivcal Three-Four, Cerivcal Four-Five, Cerivcal Five-Six Anterior Cervical Discectomy Fusion;  Surgeon: Barnett Abu, MD;  Location: [facility] OR;  Service: Neurosurgery;  Laterality: N/A;  RM 19   CORONARY ARTERY BYPASS GRAFT     3 vessel- Dr. Laneta Simmers 8/96    Current Medications: Current Meds  Medication Sig   ACCU-CHEK GUIDE test strip USE TO CHECK BLOOD SUGAR IN VITRO ONCE A DAY 90 DAYS   Accu-Chek Softclix Lancets lancets SMARTSIG:stick Topical Daily    acetaminophen (TYLENOL) 500 MG tablet Take 1,000 mg by mouth every 6 (six) hours as needed for mild pain.   ammonium lactate (LAC-HYDRIN) 12 % lotion SMARTSIG:Topical Every 12 Hours   apixaban (ELIQUIS) 5 MG TABS tablet Take 1 tablet (5 mg total) by mouth 2 (two) times daily.   Cholecalciferol (VITAMIN D) 1000 UNITS capsule Take 2,000 Units by mouth daily.    clonazePAM (KLONOPIN) 0.5 MG disintegrating tablet Take 1 tablet (0.5 mg total) by mouth 2 (two) times daily as needed (anxiety).   dapagliflozin propanediol (FARXIGA) 10 MG TABS tablet Take 1 tablet (10 mg total) by mouth daily before breakfast.   diclofenac sodium (VOLTAREN) 1 % GEL Apply 2 g topically daily as needed (pain).   docusate sodium (COLACE) 100 MG capsule Take 1 capsule (100 mg total) by mouth 2 (two) times daily.   folic acid (FOLVITE) 1 MG tablet Take 1 tablet (1 mg total) by mouth daily.   gabapentin (NEURONTIN) 100 MG capsule Take 1 capsule (100 mg total) by mouth at bedtime. (Patient taking differently: Take 100 mg by mouth in the morning.)   lidocaine 4 % Place onto the skin.   metoprolol succinate (TOPROL-XL) 100 MG 24 hr tablet Take 1 tablet (100 mg total) by mouth daily.   Multiple Vitamins-Minerals (PROTEGRA CARDIO PO) Take 1 capsule by mouth daily.     pantoprazole (PROTONIX) 40 MG tablet Take  1 tablet (40 mg total) by mouth daily.   polyethylene glycol (MIRALAX / GLYCOLAX) 17 g packet Take by mouth.   rosuvastatin (CRESTOR) 20 MG tablet Take 1 tablet (20 mg total) by mouth daily.   sodium chloride (OCEAN) 0.65 % SOLN nasal spray Place 1 spray into both nostrils as needed for congestion.   vitamin E 100 UNIT capsule Take 100 Units by mouth daily.     Allergies:   Diltiazem and Metformin   Social History   Socioeconomic History   Marital status: Widowed    Spouse name: Not on file   Number of children: Not on file   Years of education: Not on file   Highest education level: Not on file  Occupational History    Occupation: retired  Tobacco Use   Smoking status: Never   Smokeless tobacco: Never  Vaping Use   Vaping Use: Never used  Substance and Sexual Activity   Alcohol use: No   Drug use: Never   Sexual activity: Not on file  Other Topics Concern   Not on file  Social History Narrative   Not on file   Social Determinants of Health   Financial Resource Strain: Not on file  Food Insecurity: No Food Insecurity (08/11/2022)   Hunger Vital Sign    Worried About Running Out of Food in the Last Year: Never true    Ran Out of Food in the Last Year: Never true  Transportation Needs: No Transportation Needs (08/11/2022)   PRAPARE - Administrator, Civil Service (Medical): No    Lack of Transportation (Non-Medical): No  Physical Activity: Not on file  Stress: Not on file  Social Connections: Not on file     Family History: The patient's family history includes Heart disease in her brother and mother.  ROS:   Please see the history of present illness.     All other systems reviewed and are negative.  EKGs/Labs/Other Studies Reviewed:    The following studies were reviewed today: Cardiac Studies & Procedures       ECHOCARDIOGRAM  ECHOCARDIOGRAM COMPLETE 07/14/2021  Narrative ECHOCARDIOGRAM REPORT    Patient Name:   Erika Gates Date of Exam: 07/14/2021 Medical Rec #:  [id number]        Height:       60.0 in Accession #:    [id number]       Weight:       220.9 lb Date of Birth:  1935-04-09        BSA:          1.947 m Patient Age:    86 years         BP:           113/53 mmHg Patient Gender: F                HR:           53 bpm. Exam Location:  Inpatient  Procedure: 2D Echo, Color Doppler, Cardiac Doppler and Intracardiac Opacification Agent  Indications:    Respiratory distress  History:        Patient has no prior history of Echocardiogram examinations. Risk Factors:Hypertension and Diabetes.  Sonographer:    Cleatis Polka Referring Phys: [id number]  BRANDI L OLLIS  IMPRESSIONS   1. Left ventricular ejection fraction, by estimation, is 60 to 65%. The left ventricle has normal function. The left ventricle has no regional wall motion abnormalities. There is mild left ventricular hypertrophy. Left  ventricular diastolic parameters are consistent with Grade I diastolic dysfunction (impaired relaxation). Elevated left ventricular end-diastolic pressure. The E/e' is 29. 2. Right ventricular systolic function is mildly reduced. The right ventricular size is normal. There is mildly elevated pulmonary artery systolic pressure. The estimated right ventricular systolic pressure is 36.9 mmHg. 3. The mitral valve is grossly normal. Trivial mitral valve regurgitation. 4. The aortic valve is tricuspid. Aortic valve regurgitation is not visualized. 5. The inferior vena cava is dilated in size with <50% respiratory variability, suggesting right atrial pressure of 15 mmHg.  Comparison(s): No prior Echocardiogram.  FINDINGS Left Ventricle: Left ventricular ejection fraction, by estimation, is 60 to 65%. The left ventricle has normal function. The left ventricle has no regional wall motion abnormalities. The left ventricular internal cavity size was normal in size. There is mild left ventricular hypertrophy. Left ventricular diastolic parameters are consistent with Grade I diastolic dysfunction (impaired relaxation). Elevated left ventricular end-diastolic pressure. The E/e' is 51.  Right Ventricle: The right ventricular size is normal. No increase in right ventricular wall thickness. Right ventricular systolic function is mildly reduced. There is mildly elevated pulmonary artery systolic pressure. The tricuspid regurgitant velocity is 2.34 m/s, and with an assumed right atrial pressure of 15 mmHg, the estimated right ventricular systolic pressure is 36.9 mmHg.  Left Atrium: Left atrial size was normal in size.  Right Atrium: Right atrial size was normal in  size.  Pericardium: There is no evidence of pericardial effusion.  Mitral Valve: The mitral valve is grossly normal. Trivial mitral valve regurgitation.  Tricuspid Valve: The tricuspid valve is grossly normal. Tricuspid valve regurgitation is trivial.  Aortic Valve: The aortic valve is tricuspid. Aortic valve regurgitation is not visualized. Aortic valve peak gradient measures 10.1 mmHg.  Pulmonic Valve: The pulmonic valve was grossly normal. Pulmonic valve regurgitation is trivial.  Aorta: The aortic root and ascending aorta are structurally normal, with no evidence of dilitation.  Venous: The inferior vena cava is dilated in size with less than 50% respiratory variability, suggesting right atrial pressure of 15 mmHg.  IAS/Shunts: No atrial level shunt detected by color flow Doppler.   LEFT VENTRICLE PLAX 2D LVIDd:         5.00 cm      Diastology LVIDs:         2.70 cm      LV e' medial:    3.92 cm/s LV PW:         1.20 cm      LV E/e' medial:  30.1 LV IVS:        1.20 cm      LV e' lateral:   4.24 cm/s LVOT diam:     2.00 cm      LV E/e' lateral: 27.8 LV SV:         113 LV SV Index:   58 LVOT Area:     3.14 cm  LV Volumes (MOD) LV vol d, MOD A2C: 121.0 ml LV vol d, MOD A4C: 102.0 ml LV vol s, MOD A2C: 54.1 ml LV vol s, MOD A4C: 42.3 ml LV SV MOD A2C:     66.9 ml LV SV MOD A4C:     102.0 ml LV SV MOD BP:      63.5 ml  RIGHT VENTRICLE            IVC RV Basal diam:  3.70 cm    IVC diam: 2.20 cm RV Mid diam:    2.80 cm RV  S prime:     7.94 cm/s TAPSE (M-mode): 1.8 cm  LEFT ATRIUM             Index        RIGHT ATRIUM           Index LA diam:        3.60 cm 1.85 cm/m   RA Area:     12.50 cm LA Vol (A2C):   67.8 ml 34.82 ml/m  RA Volume:   26.60 ml  13.66 ml/m LA Vol (A4C):   49.8 ml 25.58 ml/m LA Biplane Vol: 60.9 ml 31.28 ml/m AORTIC VALVE AV Area (Vmax): 2.61 cm AV Vmax:        159.00 cm/s AV Peak Grad:   10.1 mmHg LVOT Vmax:      132.00 cm/s LVOT Vmean:      82.900 cm/s LVOT VTI:       0.361 m  AORTA Ao Root diam: 3.00 cm Ao Asc diam:  2.70 cm  MITRAL VALVE                TRICUSPID VALVE MV Area (PHT): 3.46 cm     TR Peak grad:   21.9 mmHg MV Decel Time: 219 msec     TR Vmax:        234.00 cm/s MV E velocity: 118.00 cm/s MV A velocity: 90.60 cm/s   SHUNTS MV E/A ratio:  1.30         Systemic VTI:  0.36 m Systemic Diam: 2.00 cm  Zoila Shutter MD Electronically signed by Zoila Shutter MD Signature Date/Time: 07/14/2021/4:50:32 PM    Final              EKG:  no new  Recent Labs: 08/10/2022: TSH 1.783 08/18/2022: ALT 69 08/19/2022: Magnesium 2.5 08/25/2022: Hemoglobin 10.0; Platelets 214 08/27/2022: BUN 20; Creatinine, Ser 0.96; Potassium 3.2; Sodium 141  Recent Lipid Panel    Component Value Date/Time   CHOL 178 12/16/2012 1035   TRIG 165.0 (H) 12/16/2012 1035   HDL 46.10 12/16/2012 1035   CHOLHDL 4 12/16/2012 1035   VLDL 33.0 12/16/2012 1035   LDLCALC 99 12/16/2012 1035   LDLDIRECT 120.4 12/12/2009 1016     Risk Assessment/Calculations:              Physical Exam:    VS:  BP (!) 140/60   Pulse 60   Ht 5' (1.524 m)   Wt 210 lb (95.3 kg)   SpO2 94%   BMI 41.01 kg/m     Wt Readings from Last 3 Encounters:  01/12/23 210 lb (95.3 kg)  10/27/22 210 lb (95.3 kg)  08/18/22 211 lb 3.2 oz (95.8 kg)     GEN:  Well nourished, well developed in no acute distress, in wheelchair HEENT: Normal NECK: No JVD; No carotid bruits LYMPHATICS: No lymphadenopathy CARDIAC: ectopy RRR, no murmurs, rubs, gallops RESPIRATORY:  Clear to auscultation without rales, wheezing or rhonchi  ABDOMEN: Soft, non-tender, non-distended MUSCULOSKELETAL:  No edema; No deformity , mild weakness SKIN: Warm and dry NEUROLOGIC:  Alert and oriented x 3 PSYCHIATRIC:  Normal affect   ASSESSMENT:    1. Persistent atrial fibrillation (HCC)   2. Chronic anticoagulation   3. Chronic diastolic CHF (congestive heart failure) (HCC)   4.  Atherosclerosis of native coronary artery of native heart without angina pectoris   5. Hyperlipidemia with target LDL less than 70    PLAN:    In order of problems listed above:  Persistent atrial fibrillation - On Toprol 150 with sinus bradycardia 50.  This was decreased to Toprol 100 daily.  She had a rash on diltiazem.  On Eliquis.  Aspirin was stopped.  Dr. Wynelle Link with [facility] has been monitoring as well.  Chronic anticoagulation - On Eliquis 5 mg twice a day.  Neurosurgery aware.  No changes.  Coronary artery disease - Remote CABG in the 1990s.  No anginal symptoms beta-blocker statin.  Hyperlipidemia - There was some confusion over which statin she is to use, she had been prescribed both atorvastatin as well as rosuvastatin or Crestor.  Chronic diastolic heart failure - Blood pressure under control on Farxiga Lasix Toprol lisinopril           Medication Adjustments/Labs and Tests Ordered: Current medicines are reviewed at length with the patient today.  Concerns regarding medicines are outlined above.  No orders of the defined types were placed in this encounter.  Meds ordered this encounter  Medications   rosuvastatin (CRESTOR) 20 MG tablet    Sig: Take 1 tablet (20 mg total) by mouth daily.    Dispense:  90 tablet    Refill:  3    Please discontinue any other statin order on file.    Patient Instructions  Medication Instructions:  Please start Rosuvastatin 20 mg once a day. Make sure to remove any other statin prescription on hand already at home. Continue all other medications as listed.  *If you need a refill on your cardiac medications before your next appointment, please call your pharmacy*  Follow-Up: At [facility], you and your health needs are our priority.  As part of our continuing mission to provide you with exceptional heart care, we have created designated Provider Care Teams.  These Care Teams include your primary Cardiologist (physician)  and Advanced Practice Providers (APPs -  Physician Assistants and Nurse Practitioners) who all work together to provide you with the care you need, when you need it.  We recommend signing up for the patient portal called "MyChart".  Sign up information is provided on this After Visit Summary.  MyChart is used to connect with patients for Virtual Visits (Telemedicine).  Patients are able to view lab/test results, encounter notes, upcoming appointments, etc.  Non-urgent messages can be sent to your provider as well.   To learn more about what you can do with MyChart, go to [email or web address].    Your next appointment:   6 month(s)  Provider:   Jacolyn Reedy, PA-C            Signed, Donato Schultz, MD  01/12/2023 11:03 AM    [facility]

## 2023-01-12 NOTE — Patient Instructions (Signed)
Medication Instructions:  Please start Rosuvastatin 20 mg once a day. Make sure to remove any other statin prescription on hand already at home. Continue all other medications as listed.  *If you need a refill on your cardiac medications before your next appointment, please call your pharmacy*  Follow-Up: At [facility], you and your health needs are our priority.  As part of our continuing mission to provide you with exceptional heart care, we have created designated Provider Care Teams.  These Care Teams include your primary Cardiologist (physician) and Advanced Practice Providers (APPs -  Physician Assistants and Nurse Practitioners) who all work together to provide you with the care you need, when you need it.  We recommend signing up for the patient portal called "MyChart".  Sign up information is provided on this After Visit Summary.  MyChart is used to connect with patients for Virtual Visits (Telemedicine).  Patients are able to view lab/test results, encounter notes, upcoming appointments, etc.  Non-urgent messages can be sent to your provider as well.   To learn more about what you can do with MyChart, go to [email or web address].    Your next appointment:   6 month(s)  Provider:   Jacolyn Reedy, PA-C

## 2023-01-19 DIAGNOSIS — J9601 Acute respiratory failure with hypoxia: Secondary | ICD-10-CM | POA: Diagnosis not present

## 2023-01-23 DIAGNOSIS — G959 Disease of spinal cord, unspecified: Secondary | ICD-10-CM | POA: Diagnosis not present

## 2023-02-19 DIAGNOSIS — J9601 Acute respiratory failure with hypoxia: Secondary | ICD-10-CM | POA: Diagnosis not present

## 2023-02-22 DIAGNOSIS — G959 Disease of spinal cord, unspecified: Secondary | ICD-10-CM | POA: Diagnosis not present

## 2023-03-19 DIAGNOSIS — E1122 Type 2 diabetes mellitus with diabetic chronic kidney disease: Secondary | ICD-10-CM | POA: Diagnosis not present

## 2023-03-19 DIAGNOSIS — I10 Essential (primary) hypertension: Secondary | ICD-10-CM | POA: Diagnosis not present

## 2023-03-19 DIAGNOSIS — E785 Hyperlipidemia, unspecified: Secondary | ICD-10-CM | POA: Diagnosis not present

## 2023-03-19 DIAGNOSIS — N2581 Secondary hyperparathyroidism of renal origin: Secondary | ICD-10-CM | POA: Diagnosis not present

## 2023-03-19 DIAGNOSIS — I130 Hypertensive heart and chronic kidney disease with heart failure and stage 1 through stage 4 chronic kidney disease, or unspecified chronic kidney disease: Secondary | ICD-10-CM | POA: Diagnosis not present

## 2023-03-19 DIAGNOSIS — R600 Localized edema: Secondary | ICD-10-CM | POA: Diagnosis not present

## 2023-03-19 DIAGNOSIS — I480 Paroxysmal atrial fibrillation: Secondary | ICD-10-CM | POA: Diagnosis not present

## 2023-03-19 DIAGNOSIS — Z0000 Encounter for general adult medical examination without abnormal findings: Secondary | ICD-10-CM | POA: Diagnosis not present

## 2023-03-19 DIAGNOSIS — N1831 Chronic kidney disease, stage 3a: Secondary | ICD-10-CM | POA: Diagnosis not present

## 2023-03-19 DIAGNOSIS — I5032 Chronic diastolic (congestive) heart failure: Secondary | ICD-10-CM | POA: Diagnosis not present

## 2023-03-21 DIAGNOSIS — J9601 Acute respiratory failure with hypoxia: Secondary | ICD-10-CM | POA: Diagnosis not present

## 2023-03-25 DIAGNOSIS — G959 Disease of spinal cord, unspecified: Secondary | ICD-10-CM | POA: Diagnosis not present

## 2023-04-09 ENCOUNTER — Other Ambulatory Visit: Payer: Self-pay

## 2023-04-09 ENCOUNTER — Emergency Department (HOSPITAL_COMMUNITY): Payer: Medicare PPO

## 2023-04-09 ENCOUNTER — Inpatient Hospital Stay (HOSPITAL_COMMUNITY): Payer: Medicare PPO

## 2023-04-09 ENCOUNTER — Encounter (HOSPITAL_COMMUNITY): Payer: Self-pay | Admitting: Internal Medicine

## 2023-04-09 ENCOUNTER — Inpatient Hospital Stay (HOSPITAL_COMMUNITY)
Admission: EM | Admit: 2023-04-09 | Discharge: 2023-04-22 | DRG: 189 | Disposition: A | Discharge status: Skilled Nursing Facility | Payer: Medicare PPO | Attending: Internal Medicine | Admitting: Internal Medicine

## 2023-04-09 DIAGNOSIS — E7800 Pure hypercholesterolemia, unspecified: Secondary | ICD-10-CM | POA: Diagnosis present

## 2023-04-09 DIAGNOSIS — G9341 Metabolic encephalopathy: Secondary | ICD-10-CM | POA: Diagnosis present

## 2023-04-09 DIAGNOSIS — D62 Acute posthemorrhagic anemia: Secondary | ICD-10-CM | POA: Diagnosis present

## 2023-04-09 DIAGNOSIS — J9621 Acute and chronic respiratory failure with hypoxia: Secondary | ICD-10-CM | POA: Diagnosis not present

## 2023-04-09 DIAGNOSIS — K922 Gastrointestinal hemorrhage, unspecified: Secondary | ICD-10-CM | POA: Diagnosis not present

## 2023-04-09 DIAGNOSIS — I130 Hypertensive heart and chronic kidney disease with heart failure and stage 1 through stage 4 chronic kidney disease, or unspecified chronic kidney disease: Secondary | ICD-10-CM | POA: Diagnosis not present

## 2023-04-09 DIAGNOSIS — M109 Gout, unspecified: Secondary | ICD-10-CM | POA: Diagnosis not present

## 2023-04-09 DIAGNOSIS — J9601 Acute respiratory failure with hypoxia: Secondary | ICD-10-CM | POA: Diagnosis not present

## 2023-04-09 DIAGNOSIS — G8250 Quadriplegia, unspecified: Secondary | ICD-10-CM | POA: Diagnosis not present

## 2023-04-09 DIAGNOSIS — Z7901 Long term (current) use of anticoagulants: Secondary | ICD-10-CM

## 2023-04-09 DIAGNOSIS — K2971 Gastritis, unspecified, with bleeding: Secondary | ICD-10-CM | POA: Diagnosis not present

## 2023-04-09 DIAGNOSIS — N179 Acute kidney failure, unspecified: Secondary | ICD-10-CM | POA: Diagnosis present

## 2023-04-09 DIAGNOSIS — R531 Weakness: Secondary | ICD-10-CM | POA: Diagnosis not present

## 2023-04-09 DIAGNOSIS — K449 Diaphragmatic hernia without obstruction or gangrene: Secondary | ICD-10-CM | POA: Diagnosis not present

## 2023-04-09 DIAGNOSIS — K3189 Other diseases of stomach and duodenum: Secondary | ICD-10-CM | POA: Diagnosis not present

## 2023-04-09 DIAGNOSIS — J9622 Acute and chronic respiratory failure with hypercapnia: Secondary | ICD-10-CM | POA: Diagnosis present

## 2023-04-09 DIAGNOSIS — Z6841 Body Mass Index (BMI) 40.0 and over, adult: Secondary | ICD-10-CM | POA: Diagnosis not present

## 2023-04-09 DIAGNOSIS — I959 Hypotension, unspecified: Secondary | ICD-10-CM | POA: Diagnosis not present

## 2023-04-09 DIAGNOSIS — K264 Chronic or unspecified duodenal ulcer with hemorrhage: Secondary | ICD-10-CM | POA: Diagnosis present

## 2023-04-09 DIAGNOSIS — R0989 Other specified symptoms and signs involving the circulatory and respiratory systems: Secondary | ICD-10-CM | POA: Diagnosis not present

## 2023-04-09 DIAGNOSIS — T68XXXA Hypothermia, initial encounter: Secondary | ICD-10-CM | POA: Diagnosis not present

## 2023-04-09 DIAGNOSIS — Z79899 Other long term (current) drug therapy: Secondary | ICD-10-CM | POA: Diagnosis not present

## 2023-04-09 DIAGNOSIS — I6782 Cerebral ischemia: Secondary | ICD-10-CM | POA: Diagnosis not present

## 2023-04-09 DIAGNOSIS — E11610 Type 2 diabetes mellitus with diabetic neuropathic arthropathy: Secondary | ICD-10-CM | POA: Diagnosis present

## 2023-04-09 DIAGNOSIS — F419 Anxiety disorder, unspecified: Secondary | ICD-10-CM | POA: Diagnosis not present

## 2023-04-09 DIAGNOSIS — Z751 Person awaiting admission to adequate facility elsewhere: Secondary | ICD-10-CM

## 2023-04-09 DIAGNOSIS — I10 Essential (primary) hypertension: Secondary | ICD-10-CM | POA: Diagnosis not present

## 2023-04-09 DIAGNOSIS — I110 Hypertensive heart disease with heart failure: Secondary | ICD-10-CM | POA: Diagnosis present

## 2023-04-09 DIAGNOSIS — R0602 Shortness of breath: Secondary | ICD-10-CM | POA: Diagnosis not present

## 2023-04-09 DIAGNOSIS — G7289 Other specified myopathies: Secondary | ICD-10-CM | POA: Diagnosis present

## 2023-04-09 DIAGNOSIS — I5033 Acute on chronic diastolic (congestive) heart failure: Secondary | ICD-10-CM | POA: Diagnosis present

## 2023-04-09 DIAGNOSIS — R0689 Other abnormalities of breathing: Secondary | ICD-10-CM | POA: Diagnosis not present

## 2023-04-09 DIAGNOSIS — Z1152 Encounter for screening for COVID-19: Secondary | ICD-10-CM | POA: Diagnosis not present

## 2023-04-09 DIAGNOSIS — Z515 Encounter for palliative care: Secondary | ICD-10-CM | POA: Diagnosis not present

## 2023-04-09 DIAGNOSIS — K269 Duodenal ulcer, unspecified as acute or chronic, without hemorrhage or perforation: Secondary | ICD-10-CM | POA: Diagnosis not present

## 2023-04-09 DIAGNOSIS — D649 Anemia, unspecified: Secondary | ICD-10-CM

## 2023-04-09 DIAGNOSIS — Z90710 Acquired absence of both cervix and uterus: Secondary | ICD-10-CM

## 2023-04-09 DIAGNOSIS — D61818 Other pancytopenia: Secondary | ICD-10-CM | POA: Diagnosis present

## 2023-04-09 DIAGNOSIS — I5030 Unspecified diastolic (congestive) heart failure: Secondary | ICD-10-CM | POA: Diagnosis not present

## 2023-04-09 DIAGNOSIS — L89152 Pressure ulcer of sacral region, stage 2: Secondary | ICD-10-CM | POA: Diagnosis not present

## 2023-04-09 DIAGNOSIS — Z8249 Family history of ischemic heart disease and other diseases of the circulatory system: Secondary | ICD-10-CM

## 2023-04-09 DIAGNOSIS — N289 Disorder of kidney and ureter, unspecified: Secondary | ICD-10-CM

## 2023-04-09 DIAGNOSIS — I480 Paroxysmal atrial fibrillation: Secondary | ICD-10-CM | POA: Diagnosis present

## 2023-04-09 DIAGNOSIS — G959 Disease of spinal cord, unspecified: Secondary | ICD-10-CM | POA: Diagnosis present

## 2023-04-09 DIAGNOSIS — G9389 Other specified disorders of brain: Secondary | ICD-10-CM | POA: Diagnosis not present

## 2023-04-09 DIAGNOSIS — N390 Urinary tract infection, site not specified: Secondary | ICD-10-CM | POA: Diagnosis present

## 2023-04-09 DIAGNOSIS — K8020 Calculus of gallbladder without cholecystitis without obstruction: Secondary | ICD-10-CM | POA: Diagnosis not present

## 2023-04-09 DIAGNOSIS — Z981 Arthrodesis status: Secondary | ICD-10-CM

## 2023-04-09 DIAGNOSIS — J9602 Acute respiratory failure with hypercapnia: Secondary | ICD-10-CM | POA: Diagnosis not present

## 2023-04-09 DIAGNOSIS — K5730 Diverticulosis of large intestine without perforation or abscess without bleeding: Secondary | ICD-10-CM | POA: Diagnosis not present

## 2023-04-09 DIAGNOSIS — R0902 Hypoxemia: Secondary | ICD-10-CM | POA: Diagnosis not present

## 2023-04-09 DIAGNOSIS — R001 Bradycardia, unspecified: Secondary | ICD-10-CM | POA: Diagnosis not present

## 2023-04-09 DIAGNOSIS — R9389 Abnormal findings on diagnostic imaging of other specified body structures: Secondary | ICD-10-CM | POA: Diagnosis not present

## 2023-04-09 DIAGNOSIS — R195 Other fecal abnormalities: Secondary | ICD-10-CM | POA: Diagnosis present

## 2023-04-09 DIAGNOSIS — Z888 Allergy status to other drugs, medicaments and biological substances status: Secondary | ICD-10-CM

## 2023-04-09 DIAGNOSIS — R4182 Altered mental status, unspecified: Secondary | ICD-10-CM | POA: Diagnosis not present

## 2023-04-09 DIAGNOSIS — K429 Umbilical hernia without obstruction or gangrene: Secondary | ICD-10-CM | POA: Diagnosis not present

## 2023-04-09 DIAGNOSIS — R8290 Unspecified abnormal findings in urine: Secondary | ICD-10-CM | POA: Diagnosis not present

## 2023-04-09 DIAGNOSIS — R6510 Systemic inflammatory response syndrome (SIRS) of non-infectious origin without acute organ dysfunction: Secondary | ICD-10-CM | POA: Diagnosis not present

## 2023-04-09 DIAGNOSIS — R918 Other nonspecific abnormal finding of lung field: Secondary | ICD-10-CM | POA: Diagnosis not present

## 2023-04-09 DIAGNOSIS — I5031 Acute diastolic (congestive) heart failure: Secondary | ICD-10-CM

## 2023-04-09 DIAGNOSIS — Z951 Presence of aortocoronary bypass graft: Secondary | ICD-10-CM | POA: Diagnosis not present

## 2023-04-09 DIAGNOSIS — R680 Hypothermia, not associated with low environmental temperature: Secondary | ICD-10-CM | POA: Diagnosis present

## 2023-04-09 DIAGNOSIS — E785 Hyperlipidemia, unspecified: Secondary | ICD-10-CM | POA: Diagnosis not present

## 2023-04-09 DIAGNOSIS — E119 Type 2 diabetes mellitus without complications: Secondary | ICD-10-CM

## 2023-04-09 DIAGNOSIS — K219 Gastro-esophageal reflux disease without esophagitis: Secondary | ICD-10-CM | POA: Diagnosis not present

## 2023-04-09 DIAGNOSIS — Z7189 Other specified counseling: Secondary | ICD-10-CM | POA: Diagnosis not present

## 2023-04-09 DIAGNOSIS — I517 Cardiomegaly: Secondary | ICD-10-CM | POA: Diagnosis not present

## 2023-04-09 DIAGNOSIS — R0600 Dyspnea, unspecified: Secondary | ICD-10-CM | POA: Diagnosis not present

## 2023-04-09 DIAGNOSIS — I4510 Unspecified right bundle-branch block: Secondary | ICD-10-CM | POA: Diagnosis not present

## 2023-04-09 DIAGNOSIS — I2510 Atherosclerotic heart disease of native coronary artery without angina pectoris: Secondary | ICD-10-CM | POA: Diagnosis present

## 2023-04-09 DIAGNOSIS — K279 Peptic ulcer, site unspecified, unspecified as acute or chronic, without hemorrhage or perforation: Secondary | ICD-10-CM | POA: Diagnosis not present

## 2023-04-09 DIAGNOSIS — R5381 Other malaise: Secondary | ICD-10-CM | POA: Diagnosis not present

## 2023-04-09 DIAGNOSIS — Z7401 Bed confinement status: Secondary | ICD-10-CM | POA: Diagnosis not present

## 2023-04-09 LAB — PREPARE RBC (CROSSMATCH)

## 2023-04-09 LAB — I-STAT CHEM 8, ED
BUN: 34 mg/dL — ABNORMAL HIGH (ref 8–23)
Calcium, Ion: 1.39 mmol/L (ref 1.15–1.40)
Chloride: 100 mmol/L (ref 98–111)
Creatinine, Ser: 1.2 mg/dL — ABNORMAL HIGH (ref 0.44–1.00)
Glucose, Bld: 122 mg/dL — ABNORMAL HIGH (ref 70–99)
HCT: 22 % — ABNORMAL LOW (ref 36.0–46.0)
Hemoglobin: 7.5 g/dL — ABNORMAL LOW (ref 12.0–15.0)
Potassium: 4.2 mmol/L (ref 3.5–5.1)
Sodium: 139 mmol/L (ref 135–145)
TCO2: 32 mmol/L (ref 22–32)

## 2023-04-09 LAB — URINALYSIS, W/ REFLEX TO CULTURE (INFECTION SUSPECTED)
Bilirubin Urine: NEGATIVE
Glucose, UA: >=500 mg/dL — AB
Hgb urine dipstick: NEGATIVE
Ketones, ur: NEGATIVE mg/dL
Leukocytes,Ua: NEGATIVE
Nitrite: NEGATIVE
Protein, ur: 30 mg/dL — AB
Specific Gravity, Urine: 1.012 (ref 1.005–1.030)
pH: 5 (ref 5.0–8.0)

## 2023-04-09 LAB — CBC WITH DIFFERENTIAL/PLATELET
Abs Immature Granulocytes: 0.01 10*3/uL (ref 0.00–0.07)
Basophils Absolute: 0 10*3/uL (ref 0.0–0.1)
Basophils Relative: 1 %
Eosinophils Absolute: 0 10*3/uL (ref 0.0–0.5)
Eosinophils Relative: 1 %
HCT: 20.3 % — ABNORMAL LOW (ref 36.0–46.0)
Hemoglobin: 4.9 g/dL — CL (ref 12.0–15.0)
Immature Granulocytes: 0 %
Lymphocytes Relative: 21 %
Lymphs Abs: 0.8 10*3/uL (ref 0.7–4.0)
MCH: 18.3 pg — ABNORMAL LOW (ref 26.0–34.0)
MCHC: 24.1 g/dL — ABNORMAL LOW (ref 30.0–36.0)
MCV: 75.7 fL — ABNORMAL LOW (ref 80.0–100.0)
Monocytes Absolute: 0.3 10*3/uL (ref 0.1–1.0)
Monocytes Relative: 9 %
Neutro Abs: 2.5 10*3/uL (ref 1.7–7.7)
Neutrophils Relative %: 68 %
Platelets: 106 10*3/uL — ABNORMAL LOW (ref 150–400)
RBC: 2.68 MIL/uL — ABNORMAL LOW (ref 3.87–5.11)
RDW: 20.2 % — ABNORMAL HIGH (ref 11.5–15.5)
WBC: 3.6 10*3/uL — ABNORMAL LOW (ref 4.0–10.5)
nRBC: 1.9 % — ABNORMAL HIGH (ref 0.0–0.2)

## 2023-04-09 LAB — I-STAT ARTERIAL BLOOD GAS, ED
Acid-Base Excess: 5 mmol/L — ABNORMAL HIGH (ref 0.0–2.0)
Bicarbonate: 33.4 mmol/L — ABNORMAL HIGH (ref 20.0–28.0)
Calcium, Ion: 1.42 mmol/L — ABNORMAL HIGH (ref 1.15–1.40)
HCT: 20 % — ABNORMAL LOW (ref 36.0–46.0)
Hemoglobin: 6.8 g/dL — CL (ref 12.0–15.0)
O2 Saturation: 99 %
Patient temperature: 96.2
Potassium: 4.3 mmol/L (ref 3.5–5.1)
Sodium: 140 mmol/L (ref 135–145)
TCO2: 36 mmol/L — ABNORMAL HIGH (ref 22–32)
pCO2 arterial: 72.9 mmHg (ref 32–48)
pH, Arterial: 7.262 — ABNORMAL LOW (ref 7.35–7.45)
pO2, Arterial: 166 mmHg — ABNORMAL HIGH (ref 83–108)

## 2023-04-09 LAB — ECHOCARDIOGRAM COMPLETE
AR max vel: 2.14 cm2
AV Peak grad: 11.2 mmHg
Ao pk vel: 1.67 m/s
Area-P 1/2: 3.77 cm2
MV M vel: 3.79 m/s
MV Peak grad: 57.5 mmHg
S' Lateral: 2.7 cm

## 2023-04-09 LAB — HEMOGLOBIN AND HEMATOCRIT, BLOOD
HCT: 26.6 % — ABNORMAL LOW (ref 36.0–46.0)
Hemoglobin: 7.1 g/dL — ABNORMAL LOW (ref 12.0–15.0)

## 2023-04-09 LAB — I-STAT VENOUS BLOOD GAS, ED
Acid-Base Excess: 6 mmol/L — ABNORMAL HIGH (ref 0.0–2.0)
Acid-Base Excess: 5 mmol/L — ABNORMAL HIGH (ref 0.0–2.0)
Bicarbonate: 34.3 mmol/L — ABNORMAL HIGH (ref 20.0–28.0)
Bicarbonate: 32.1 mmol/L — ABNORMAL HIGH (ref 20.0–28.0)
Calcium, Ion: 1.39 mmol/L (ref 1.15–1.40)
Calcium, Ion: 1.31 mmol/L (ref 1.15–1.40)
HCT: 22 % — ABNORMAL LOW (ref 36.0–46.0)
HCT: 25 % — ABNORMAL LOW (ref 36.0–46.0)
Hemoglobin: 7.5 g/dL — ABNORMAL LOW (ref 12.0–15.0)
Hemoglobin: 8.5 g/dL — ABNORMAL LOW (ref 12.0–15.0)
O2 Saturation: 69 %
O2 Saturation: 97 %
Potassium: 4.2 mmol/L (ref 3.5–5.1)
Potassium: 4.4 mmol/L (ref 3.5–5.1)
Sodium: 140 mmol/L (ref 135–145)
Sodium: 139 mmol/L (ref 135–145)
TCO2: 37 mmol/L — ABNORMAL HIGH (ref 22–32)
TCO2: 34 mmol/L — ABNORMAL HIGH (ref 22–32)
pCO2, Ven: 79.5 mmHg (ref 44–60)
pCO2, Ven: 58.9 mmHg (ref 44–60)
pH, Ven: 7.243 — ABNORMAL LOW (ref 7.25–7.43)
pH, Ven: 7.344 (ref 7.25–7.43)
pO2, Ven: 44 mmHg (ref 32–45)
pO2, Ven: 94 mmHg — ABNORMAL HIGH (ref 32–45)

## 2023-04-09 LAB — I-STAT CG4 LACTIC ACID, ED
Lactic Acid, Venous: 0.8 mmol/L (ref 0.5–1.9)
Lactic Acid, Venous: 0.5 mmol/L (ref 0.5–1.9)

## 2023-04-09 LAB — ABO/RH: ABO/RH(D): O NEG

## 2023-04-09 LAB — COMPREHENSIVE METABOLIC PANEL
ALT: 22 U/L (ref 0–44)
AST: 32 U/L (ref 15–41)
Albumin: 3.2 g/dL — ABNORMAL LOW (ref 3.5–5.0)
Alkaline Phosphatase: 82 U/L (ref 38–126)
Anion gap: 10 (ref 5–15)
BUN: 32 mg/dL — ABNORMAL HIGH (ref 8–23)
CO2: 29 mmol/L (ref 22–32)
Calcium: 10 mg/dL (ref 8.9–10.3)
Chloride: 100 mmol/L (ref 98–111)
Creatinine, Ser: 1.13 mg/dL — ABNORMAL HIGH (ref 0.44–1.00)
GFR, Estimated: 47 mL/min — ABNORMAL LOW (ref >60)
Glucose, Bld: 127 mg/dL — ABNORMAL HIGH (ref 70–99)
Potassium: 4.2 mmol/L (ref 3.5–5.1)
Sodium: 139 mmol/L (ref 135–145)
Total Bilirubin: 0.5 mg/dL (ref 0.3–1.2)
Total Protein: 6 g/dL — ABNORMAL LOW (ref 6.5–8.1)

## 2023-04-09 LAB — BLOOD GAS, VENOUS
Acid-Base Excess: 3.2 mmol/L — ABNORMAL HIGH (ref 0.0–2.0)
Bicarbonate: 32 mmol/L — ABNORMAL HIGH (ref 20.0–28.0)
O2 Saturation: 75.8 %
Patient temperature: 36.3
pCO2, Ven: 66 mmHg — ABNORMAL HIGH (ref 44–60)
pH, Ven: 7.29 (ref 7.25–7.43)
pO2, Ven: 43 mmHg (ref 32–45)

## 2023-04-09 LAB — SARS CORONAVIRUS 2 BY RT PCR: SARS Coronavirus 2 by RT PCR: NEGATIVE

## 2023-04-09 LAB — TROPONIN I (HIGH SENSITIVITY)
Troponin I (High Sensitivity): 10 ng/L (ref <18)
Troponin I (High Sensitivity): 10 ng/L (ref <18)

## 2023-04-09 LAB — BRAIN NATRIURETIC PEPTIDE: B Natriuretic Peptide: 1659.1 pg/mL — ABNORMAL HIGH (ref 0.0–100.0)

## 2023-04-09 LAB — POC OCCULT BLOOD, ED: Fecal Occult Bld: POSITIVE — AB

## 2023-04-09 MED ORDER — SODIUM CHLORIDE 0.9% IV SOLUTION: Freq: Once | INTRAVENOUS | Status: AC

## 2023-04-09 MED ORDER — SODIUM CHLORIDE 0.9 % IV SOLN
2.0000 g | INTRAVENOUS | Status: DC
  Administered 2023-04-09 – 2023-04-15 (×7): 2 g via INTRAVENOUS
  Filled 2023-04-09 (×7): qty 20

## 2023-04-09 MED ORDER — ALBUTEROL SULFATE (2.5 MG/3ML) 0.083% IN NEBU
2.5000 mg | INHALATION_SOLUTION | RESPIRATORY_TRACT | Status: DC | PRN
  Administered 2023-04-16: 2.5 mg via RESPIRATORY_TRACT
  Filled 2023-04-09: qty 3

## 2023-04-09 MED ORDER — FUROSEMIDE 10 MG/ML IJ SOLN
40.0000 mg | Freq: Two times a day (BID) | INTRAMUSCULAR | Status: DC
  Administered 2023-04-10: 40 mg via INTRAVENOUS
  Filled 2023-04-09 (×3): qty 4

## 2023-04-09 MED ORDER — ROSUVASTATIN CALCIUM 20 MG PO TABS
20.0000 mg | ORAL_TABLET | Freq: Every evening | ORAL | Status: DC
  Administered 2023-04-09 – 2023-04-21 (×13): 20 mg via ORAL
  Filled 2023-04-09 (×13): qty 1

## 2023-04-09 MED ORDER — ACETAMINOPHEN 650 MG RE SUPP: 650.0000 mg | Freq: Four times a day (QID) | RECTAL | Status: DC | PRN

## 2023-04-09 MED ORDER — DICLOFENAC SODIUM 1 % TD GEL: 2.0000 g | Freq: Every day | TRANSDERMAL | Status: DC | PRN

## 2023-04-09 MED ORDER — DAPAGLIFLOZIN PROPANEDIOL 10 MG PO TABS: 10.0000 mg | ORAL_TABLET | Freq: Every day | ORAL | Status: DC

## 2023-04-09 MED ORDER — ACETAMINOPHEN 325 MG PO TABS
650.0000 mg | ORAL_TABLET | Freq: Four times a day (QID) | ORAL | Status: DC | PRN
  Administered 2023-04-09 – 2023-04-19 (×5): 650 mg via ORAL
  Filled 2023-04-09 (×6): qty 2

## 2023-04-09 MED ORDER — PANTOPRAZOLE SODIUM 40 MG IV SOLR
40.0000 mg | Freq: Two times a day (BID) | INTRAVENOUS | Status: DC
  Administered 2023-04-09 – 2023-04-10 (×3): 40 mg via INTRAVENOUS
  Filled 2023-04-09 (×3): qty 10

## 2023-04-09 MED ORDER — MIDODRINE HCL 5 MG PO TABS
5.0000 mg | ORAL_TABLET | Freq: Two times a day (BID) | ORAL | Status: DC
  Administered 2023-04-09 – 2023-04-14 (×10): 5 mg via ORAL
  Filled 2023-04-09 (×11): qty 1

## 2023-04-09 MED ORDER — FUROSEMIDE 10 MG/ML IJ SOLN
40.0000 mg | Freq: Once | INTRAMUSCULAR | Status: AC
  Administered 2023-04-09: 40 mg via INTRAVENOUS
  Filled 2023-04-09: qty 4

## 2023-04-09 MED ORDER — SODIUM CHLORIDE 0.9% FLUSH
3.0000 mL | Freq: Two times a day (BID) | INTRAVENOUS | Status: DC
  Administered 2023-04-09 – 2023-04-22 (×27): 3 mL via INTRAVENOUS

## 2023-04-09 MED ORDER — LIDOCAINE 5 % EX PTCH
1.0000 | MEDICATED_PATCH | Freq: Every day | CUTANEOUS | Status: DC | PRN
  Administered 2023-04-09: 1 via TRANSDERMAL
  Filled 2023-04-09: qty 1

## 2023-04-09 NOTE — H&P (View-Only) (Signed)
Referring Provider: Dr. Katrinka Blazing Primary Care Physician:  [name], MD Primary Gastroenterologist:  Gentry Fitz  Reason for Consultation:  Anemia  HPI: Erika Gates is a 88 y.o. female with multiple medical problems presented to ER with worsening weakness. Daughter in room and provides history along with chart review. Patient lying in bed on Bipap and not waking up during my evaluation. Daughter denies seeing any rectal bleeding or black stools. No N/V reported. She is not sure when her last colonoscopy was but knows it was at least 10 years ago. Hgb 7.5 then 4.9 then 6.8 then 8.5 following 2 U PRBCs. Heme positive stool. On Eliquis for Afib.  Past Medical History:  Diagnosis Date   Allergic rhinitis    Anemia    Anxiety    Coronary atherosclerosis of unspecified type of vessel, native or graft    Diabetes mellitus    DJD (degenerative joint disease)    Hypercholesteremia    Hypertension    Low back pain    Morbid obesity (HCC)    Venous insufficiency    Vitamin D deficiency     Past Surgical History:  Procedure Laterality Date   ABDOMINAL HYSTERECTOMY     ANTERIOR CERVICAL DECOMP/DISCECTOMY FUSION N/A 08/14/2022   Procedure: Cerivcal Three-Four, Cerivcal Four-Five, Cerivcal Five-Six Anterior Cervical Discectomy Fusion;  Surgeon: Barnett Abu, MD;  Location: [facility] OR;  Service: Neurosurgery;  Laterality: N/A;  RM 19   CORONARY ARTERY BYPASS GRAFT     3 vessel- Dr. Laneta Simmers 8/96    Prior to Admission medications   Medication Sig Start Date End Date Taking? Authorizing Provider  acetaminophen (TYLENOL) 325 MG tablet Take 650 mg by mouth 2 (two) times daily as needed for moderate pain, fever or headache.   Yes [provider]  ammonium lactate (LAC-HYDRIN) 12 % lotion Apply 1 Application topically daily as needed for dry skin.   Yes [provider]  apixaban (ELIQUIS) 5 MG TABS tablet Take 1 tablet (5 mg total) by mouth 2 (two) times daily. 11/23/22  Yes Dyann Kief, PA-C  Cholecalciferol (VITAMIN D-3 PO) Take 1 tablet by mouth daily.   Yes [provider]  clonazePAM (KLONOPIN) 0.5 MG disintegrating tablet Take 1 tablet (0.5 mg total) by mouth 2 (two) times daily as needed (anxiety). Patient taking differently: Take 0.5 mg by mouth 2 (two) times daily. 08/27/22  Yes Rai, Ripudeep K, MD  dapagliflozin propanediol (FARXIGA) 10 MG TABS tablet Take 1 tablet (10 mg total) by mouth daily before breakfast. 02/02/22  Yes Lyn Records, MD  diclofenac sodium (VOLTAREN) 1 % GEL Apply 2 g topically daily as needed (pain). 08/27/22  Yes Rai, Ripudeep K, MD  docusate sodium (COLACE) 100 MG capsule Take 1 capsule (100 mg total) by mouth 2 (two) times daily. Patient taking differently: Take 100 mg by mouth daily as needed (constipation). 08/27/22  Yes Rai, Ripudeep K, MD  furosemide (LASIX) 20 MG tablet Take 1 tablet (20 mg total) by mouth daily. 07/18/21 06/05/23 Yes Meredeth Ide, MD  gabapentin (NEURONTIN) 100 MG capsule Take 1 capsule (100 mg total) by mouth at bedtime. Patient taking differently: Take 100 mg by mouth daily. 03/17/18  Yes Gearldine Bienenstock, PA-C  lidocaine 4 % Place 1 patch onto the skin daily as needed (pain). 09/25/22  Yes [provider]  lisinopril (ZESTRIL) 20 MG tablet Take 1 tablet (20 mg total) by mouth daily. 07/18/21 06/05/23 Yes Meredeth Ide, MD  metoprolol succinate (TOPROL-XL)  100 MG 24 hr tablet Take 1 tablet (100 mg total) by mouth daily. 10/27/22  Yes Dyann Kief, PA-C  Multiple Vitamins-Minerals (MULTIVITAMIN WOMEN 50+) TABS Take 1 tablet by mouth daily.   Yes [provider]  pantoprazole (PROTONIX) 40 MG tablet Take 1 tablet (40 mg total) by mouth daily. 08/27/22  Yes Rai, Ripudeep K, MD  polyethylene glycol (MIRALAX / GLYCOLAX) 17 g packet Take 17 g by mouth daily as needed (constipation). 09/25/22  Yes [provider]  rosuvastatin (CRESTOR) 20 MG tablet Take 1 tablet (20 mg total) by mouth daily.  01/12/23  Yes Jake Bathe, MD  VITAMIN E PO Take 1 capsule by mouth daily.   Yes [provider]    Scheduled Meds:  furosemide  40 mg Intravenous BID   pantoprazole (PROTONIX) IV  40 mg Intravenous Q12H   rosuvastatin  20 mg Oral QPM   sodium chloride flush  3 mL Intravenous Q12H   Continuous Infusions:  cefTRIAXone (ROCEPHIN)  IV Stopped (04/09/23 1320)   PRN Meds:.acetaminophen **OR** acetaminophen, albuterol, lidocaine  Allergies as of 04/09/2023 - Review Complete 04/09/2023  Allergen Reaction Noted   Cardizem [diltiazem] Rash 10/27/2022   Glucophage [metformin] Other (See Comments)     Family History  Problem Relation Age of Onset   Heart disease Mother    Heart disease Brother     Social History   Socioeconomic History   Marital status: Widowed    Spouse name: Not on file   Number of children: Not on file   Years of education: Not on file   Highest education level: Not on file  Occupational History   Occupation: retired  Tobacco Use   Smoking status: Never   Smokeless tobacco: Never  Vaping Use   Vaping status: Never Used  Substance and Sexual Activity   Alcohol use: No   Drug use: Never   Sexual activity: Not on file  Other Topics Concern   Not on file  Social History Narrative   Not on file   Social Determinants of Health   Financial Resource Strain: Low Risk  (08/27/2022)   Received from [facility], [facility]   Overall Financial Resource Strain (CARDIA)    Difficulty of Paying Living Expenses: Not hard at all  Food Insecurity: No Food Insecurity (08/27/2022)   Received from [facility], [facility]   Hunger Vital Sign    Worried About Running Out of Food in the Last Year: Never true    Ran Out of Food in the Last Year: Never true  Transportation Needs: Unknown (08/27/2022)   Received from [facility], [facility]   [facility] -  Transportation    In the past 12 months, has lack of transportation kept you from medical appointments or from getting medications?: No    Lack of Transportation (Non-Medical): Not on file  Physical Activity: Not on file  Stress: Not on file  Social Connections: Not on file  Intimate Partner Violence: Not At Risk (08/11/2022)   Humiliation, Afraid, Rape, and Kick questionnaire    Fear of Current or Ex-Partner: No    Emotionally Abused: No    Physically Abused: No    Sexually Abused: No    Review of Systems: All negative except as stated above in HPI.  Physical Exam: Vital signs: Vitals:   04/09/23 1215 04/09/23 1230  BP: (!) 101/46 (!) 105/42  Pulse: (!) 52 [name])  50  Resp:    Temp:    SpO2: 100% 100%  T 96.6, R 20   General:   chronically ill-appearing, elderly, somnolent, obese  Head: normocephalic, atraumatic Eyes: anicteric sclera ENT: oropharynx clear Neck: supple, nontender Lungs:  Clear throughout to auscultation.   No wheezes, crackles, or rhonchi. No acute distress. Heart:  Regular rate and rhythm; no murmurs, clicks, rubs,  or gallops. Abdomen: minimal epigastric tenderness with guarding, otherwise nontender, soft, nondistended, +BS  Rectal:  Deferred Ext: no edema  GI:  Lab Results: Recent Labs    04/09/23 0217 04/09/23 0450 04/09/23 1210  WBC 3.6*  --   --   HGB 4.9* 6.8* 8.5*  HCT 20.3* 20.0* 25.0*  PLT 106*  --   --    BMET Recent Labs    04/09/23 0122 04/09/23 0127 04/09/23 0128 04/09/23 0450 04/09/23 1210  NA 139   < > 139 140 139  K 4.2   < > 4.2 4.3 4.4  CL 100  --  100  --   --   CO2 29  --   --   --   --   GLUCOSE 127*  --  122*  --   --   BUN 32*  --  34*  --   --   CREATININE 1.13*  --  1.20*  --   --   CALCIUM 10.0  --   --   --   --    < > = values in this interval not displayed.   LFT Recent Labs    04/09/23 0122  PROT 6.0*  ALBUMIN 3.2*  AST 32  ALT 22  ALKPHOS 82  BILITOT 0.5   PT/INR No results for input(s):  "LABPROT", "INR" in the last 72 hours.   Studies/Results: CT HEAD WO CONTRAST ( )  Result Date: 04/09/2023 CLINICAL DATA:  Mental status change, unknown cause. EXAM: CT HEAD WITHOUT CONTRAST TECHNIQUE: Contiguous axial images were obtained from the base of the skull through the vertex without intravenous contrast. RADIATION DOSE REDUCTION: This exam was performed according to the departmental dose-optimization program which includes automated exposure control, adjustment of the mA and/or kV according to patient size and/or use of iterative reconstruction technique. COMPARISON:  07/31/2022. FINDINGS: Brain: No acute intracranial hemorrhage, midline shift or mass effect. No extra-axial fluid collection. Periventricular white matter hypodensities are noted bilaterally. No hydrocephalus. A stable dural-based is noted over the frontal bone on the right, possible calcified meningioma, and unchanged from the prior exam. Vascular: No hyperdense vessel or unexpected calcification. Skull: Normal. Negative for fracture or focal lesion. Sinuses/Orbits: No acute finding. Other: None. IMPRESSION: 1. No acute intracranial process. 2. Chronic microvascular ischemic changes. 3. Stable extra-axial calcification over the frontal lobe on the right, possible meningioma and unchanged from the previous exam. Electronically Signed   By: Thornell Sartorius M.D.   On: 04/09/2023 02:44   DG Chest Port 1 View  Result Date: 04/09/2023 CLINICAL DATA:  Shortness of breath EXAM: PORTABLE CHEST 1 VIEW COMPARISON:  08/16/2022 FINDINGS: Check shadow is enlarged but stable. Postsurgical changes are again seen. Elevation of the right hemidiaphragm is noted. Central vascular congestion is seen without significant edema. No focal infiltrate is noted. Postsurgical changes in the cervical spine are seen. IMPRESSION: Central vascular congestion without significant edema. Electronically Signed   By: Alcide Clever M.D.   On: 04/09/2023 01:24     Impression/Plan: Symptomatic anemia with heme positive stool without overt bleeding - would not recommend  any endoscopic procedures at this time without overt bleeding due to her multiple comorbidities. Consider noncontrast CT to look for retroperitoneal bleed if anemia recurs. Supportive care. NPO. Dr. Levora Angel from [facility] will f/u tomorrow.    LOS: 0 days   Shirley Friar  04/09/2023, 1:23 PM  Questions please call [phone]

## 2023-04-09 NOTE — ED Triage Notes (Signed)
Pt arrives to ED c/o lethargy and bradycardia that started 2310 while pt was at home. Pt reported to have increased respirations, O2 saturation of 70's on RA and less responsive per family. Pt with HR of 30's and given 1mg  of Atropine at 0046 per EMS with rates improving to 50's. Pt placed on 2L with improvement of saturation to 90's

## 2023-04-09 NOTE — Consult Note (Signed)
Referring Provider: Dr. Katrinka Blazing Primary Care Physician:  [name], MD Primary Gastroenterologist:  Gentry Fitz  Reason for Consultation:  Anemia  HPI: Erika Gates is a 88 y.o. female with multiple medical problems presented to ER with worsening weakness. Daughter in room and provides history along with chart review. Patient lying in bed on Bipap and not waking up during my evaluation. Daughter denies seeing any rectal bleeding or black stools. No N/V reported. She is not sure when her last colonoscopy was but knows it was at least 10 years ago. Hgb 7.5 then 4.9 then 6.8 then 8.5 following 2 U PRBCs. Heme positive stool. On Eliquis for Afib.  Past Medical History:  Diagnosis Date   Allergic rhinitis    Anemia    Anxiety    Coronary atherosclerosis of unspecified type of vessel, native or graft    Diabetes mellitus    DJD (degenerative joint disease)    Hypercholesteremia    Hypertension    Low back pain    Morbid obesity (HCC)    Venous insufficiency    Vitamin D deficiency     Past Surgical History:  Procedure Laterality Date   ABDOMINAL HYSTERECTOMY     ANTERIOR CERVICAL DECOMP/DISCECTOMY FUSION N/A 08/14/2022   Procedure: Cerivcal Three-Four, Cerivcal Four-Five, Cerivcal Five-Six Anterior Cervical Discectomy Fusion;  Surgeon: Barnett Abu, MD;  Location: [facility] OR;  Service: Neurosurgery;  Laterality: N/A;  RM 19   CORONARY ARTERY BYPASS GRAFT     3 vessel- Dr. Laneta Simmers 8/96    Prior to Admission medications   Medication Sig Start Date End Date Taking? Authorizing Provider  acetaminophen (TYLENOL) 325 MG tablet Take 650 mg by mouth 2 (two) times daily as needed for moderate pain, fever or headache.   Yes [provider]  ammonium lactate (LAC-HYDRIN) 12 % lotion Apply 1 Application topically daily as needed for dry skin.   Yes [provider]  apixaban (ELIQUIS) 5 MG TABS tablet Take 1 tablet (5 mg total) by mouth 2 (two) times daily. 11/23/22  Yes Dyann Kief, PA-C  Cholecalciferol (VITAMIN D-3 PO) Take 1 tablet by mouth daily.   Yes [provider]  clonazePAM (KLONOPIN) 0.5 MG disintegrating tablet Take 1 tablet (0.5 mg total) by mouth 2 (two) times daily as needed (anxiety). Patient taking differently: Take 0.5 mg by mouth 2 (two) times daily. 08/27/22  Yes Rai, Ripudeep K, MD  dapagliflozin propanediol (FARXIGA) 10 MG TABS tablet Take 1 tablet (10 mg total) by mouth daily before breakfast. 02/02/22  Yes Lyn Records, MD  diclofenac sodium (VOLTAREN) 1 % GEL Apply 2 g topically daily as needed (pain). 08/27/22  Yes Rai, Ripudeep K, MD  docusate sodium (COLACE) 100 MG capsule Take 1 capsule (100 mg total) by mouth 2 (two) times daily. Patient taking differently: Take 100 mg by mouth daily as needed (constipation). 08/27/22  Yes Rai, Ripudeep K, MD  furosemide (LASIX) 20 MG tablet Take 1 tablet (20 mg total) by mouth daily. 07/18/21 06/05/23 Yes Meredeth Ide, MD  gabapentin (NEURONTIN) 100 MG capsule Take 1 capsule (100 mg total) by mouth at bedtime. Patient taking differently: Take 100 mg by mouth daily. 03/17/18  Yes Gearldine Bienenstock, PA-C  lidocaine 4 % Place 1 patch onto the skin daily as needed (pain). 09/25/22  Yes [provider]  lisinopril (ZESTRIL) 20 MG tablet Take 1 tablet (20 mg total) by mouth daily. 07/18/21 06/05/23 Yes Meredeth Ide, MD  metoprolol succinate (TOPROL-XL)  100 MG 24 hr tablet Take 1 tablet (100 mg total) by mouth daily. 10/27/22  Yes Dyann Kief, PA-C  Multiple Vitamins-Minerals (MULTIVITAMIN WOMEN 50+) TABS Take 1 tablet by mouth daily.   Yes [provider]  pantoprazole (PROTONIX) 40 MG tablet Take 1 tablet (40 mg total) by mouth daily. 08/27/22  Yes Rai, Ripudeep K, MD  polyethylene glycol (MIRALAX / GLYCOLAX) 17 g packet Take 17 g by mouth daily as needed (constipation). 09/25/22  Yes [provider]  rosuvastatin (CRESTOR) 20 MG tablet Take 1 tablet (20 mg total) by mouth daily.  01/12/23  Yes Jake Bathe, MD  VITAMIN E PO Take 1 capsule by mouth daily.   Yes [provider]    Scheduled Meds:  furosemide  40 mg Intravenous BID   pantoprazole (PROTONIX) IV  40 mg Intravenous Q12H   rosuvastatin  20 mg Oral QPM   sodium chloride flush  3 mL Intravenous Q12H   Continuous Infusions:  cefTRIAXone (ROCEPHIN)  IV Stopped (04/09/23 1320)   PRN Meds:.acetaminophen **OR** acetaminophen, albuterol, lidocaine  Allergies as of 04/09/2023 - Review Complete 04/09/2023  Allergen Reaction Noted   Cardizem [diltiazem] Rash 10/27/2022   Glucophage [metformin] Other (See Comments)     Family History  Problem Relation Age of Onset   Heart disease Mother    Heart disease Brother     Social History   Socioeconomic History   Marital status: Widowed    Spouse name: Not on file   Number of children: Not on file   Years of education: Not on file   Highest education level: Not on file  Occupational History   Occupation: retired  Tobacco Use   Smoking status: Never   Smokeless tobacco: Never  Vaping Use   Vaping status: Never Used  Substance and Sexual Activity   Alcohol use: No   Drug use: Never   Sexual activity: Not on file  Other Topics Concern   Not on file  Social History Narrative   Not on file   Social Determinants of Health   Financial Resource Strain: Low Risk  (08/27/2022)   Received from [facility], [facility]   Overall Financial Resource Strain (CARDIA)    Difficulty of Paying Living Expenses: Not hard at all  Food Insecurity: No Food Insecurity (08/27/2022)   Received from [facility], [facility]   Hunger Vital Sign    Worried About Running Out of Food in the Last Year: Never true    Ran Out of Food in the Last Year: Never true  Transportation Needs: Unknown (08/27/2022)   Received from [facility], [facility]   [facility] -  Transportation    In the past 12 months, has lack of transportation kept you from medical appointments or from getting medications?: No    Lack of Transportation (Non-Medical): Not on file  Physical Activity: Not on file  Stress: Not on file  Social Connections: Not on file  Intimate Partner Violence: Not At Risk (08/11/2022)   Humiliation, Afraid, Rape, and Kick questionnaire    Fear of Current or Ex-Partner: No    Emotionally Abused: No    Physically Abused: No    Sexually Abused: No    Review of Systems: All negative except as stated above in HPI.  Physical Exam: Vital signs: Vitals:   04/09/23 1215 04/09/23 1230  BP: (!) 101/46 (!) 105/42  Pulse: (!) 52 [name])  50  Resp:    Temp:    SpO2: 100% 100%  T 96.6, R 20   General:   chronically ill-appearing, elderly, somnolent, obese  Head: normocephalic, atraumatic Eyes: anicteric sclera ENT: oropharynx clear Neck: supple, nontender Lungs:  Clear throughout to auscultation.   No wheezes, crackles, or rhonchi. No acute distress. Heart:  Regular rate and rhythm; no murmurs, clicks, rubs,  or gallops. Abdomen: minimal epigastric tenderness with guarding, otherwise nontender, soft, nondistended, +BS  Rectal:  Deferred Ext: no edema  GI:  Lab Results: Recent Labs    04/09/23 0217 04/09/23 0450 04/09/23 1210  WBC 3.6*  --   --   HGB 4.9* 6.8* 8.5*  HCT 20.3* 20.0* 25.0*  PLT 106*  --   --    BMET Recent Labs    04/09/23 0122 04/09/23 0127 04/09/23 0128 04/09/23 0450 04/09/23 1210  NA 139   < > 139 140 139  K 4.2   < > 4.2 4.3 4.4  CL 100  --  100  --   --   CO2 29  --   --   --   --   GLUCOSE 127*  --  122*  --   --   BUN 32*  --  34*  --   --   CREATININE 1.13*  --  1.20*  --   --   CALCIUM 10.0  --   --   --   --    < > = values in this interval not displayed.   LFT Recent Labs    04/09/23 0122  PROT 6.0*  ALBUMIN 3.2*  AST 32  ALT 22  ALKPHOS 82  BILITOT 0.5   PT/INR No results for input(s):  "LABPROT", "INR" in the last 72 hours.   Studies/Results: CT HEAD WO CONTRAST ( )  Result Date: 04/09/2023 CLINICAL DATA:  Mental status change, unknown cause. EXAM: CT HEAD WITHOUT CONTRAST TECHNIQUE: Contiguous axial images were obtained from the base of the skull through the vertex without intravenous contrast. RADIATION DOSE REDUCTION: This exam was performed according to the departmental dose-optimization program which includes automated exposure control, adjustment of the mA and/or kV according to patient size and/or use of iterative reconstruction technique. COMPARISON:  07/31/2022. FINDINGS: Brain: No acute intracranial hemorrhage, midline shift or mass effect. No extra-axial fluid collection. Periventricular white matter hypodensities are noted bilaterally. No hydrocephalus. A stable dural-based is noted over the frontal bone on the right, possible calcified meningioma, and unchanged from the prior exam. Vascular: No hyperdense vessel or unexpected calcification. Skull: Normal. Negative for fracture or focal lesion. Sinuses/Orbits: No acute finding. Other: None. IMPRESSION: 1. No acute intracranial process. 2. Chronic microvascular ischemic changes. 3. Stable extra-axial calcification over the frontal lobe on the right, possible meningioma and unchanged from the previous exam. Electronically Signed   By: Thornell Sartorius M.D.   On: 04/09/2023 02:44   DG Chest Port 1 View  Result Date: 04/09/2023 CLINICAL DATA:  Shortness of breath EXAM: PORTABLE CHEST 1 VIEW COMPARISON:  08/16/2022 FINDINGS: Check shadow is enlarged but stable. Postsurgical changes are again seen. Elevation of the right hemidiaphragm is noted. Central vascular congestion is seen without significant edema. No focal infiltrate is noted. Postsurgical changes in the cervical spine are seen. IMPRESSION: Central vascular congestion without significant edema. Electronically Signed   By: Alcide Clever M.D.   On: 04/09/2023 01:24     Impression/Plan: Symptomatic anemia with heme positive stool without overt bleeding - would not recommend  any endoscopic procedures at this time without overt bleeding due to her multiple comorbidities. Consider noncontrast CT to look for retroperitoneal bleed if anemia recurs. Supportive care. NPO. Dr. Levora Angel from [facility] will f/u tomorrow.    LOS: 0 days   Shirley Friar  04/09/2023, 1:23 PM  Questions please call [phone]

## 2023-04-09 NOTE — Progress Notes (Signed)
   04/09/23 2353  BiPAP/CPAP/SIPAP  $ Face Mask Medium Yes  BiPAP/CPAP/SIPAP Pt Type Adult  BiPAP/CPAP/SIPAP Resmed  Mask Type Nasal mask  Mask Size Medium  IPAP 10 cmH20  EPAP 5 cmH2O  Pressure Support 5 cmH20  PEEP 5 cmH20  Flow Rate 3 lpm  Patient Home Equipment No  Nasal massage performed Yes  CPAP/SIPAP surface wiped down Yes  Safety Check Completed by RT for Home Unit Yes, no issues noted

## 2023-04-09 NOTE — Progress Notes (Signed)
Echocardiogram 2D Echocardiogram has been performed.  Erika Gates 04/09/2023, 3:51 PM

## 2023-04-09 NOTE — ED Notes (Signed)
Axillary temp of 97.3.  Previous tympanic temp of 98.1.  Bair hugger turned off.

## 2023-04-09 NOTE — TOC CM/SW Note (Signed)
Transition of Care [facility]) - Inpatient Brief Assessment   Patient Details  Name: Erika Gates MRN: [id number] Date of Birth: 1935-06-03  [facility] of [facility]) CM/SW Contact:    Gordy Clement, RN Phone Number: 04/09/2023, 3:39 PM   Clinical Narrative:  Patient is from home. Has had [facility] in the past. Daughter assists with caregiving.  Patient uses walker at baseline to ambulate. No O2 at baseline TOC will continue to follow patient for any additional discharge needs       Transition of Care Asessment: Insurance and Status: Insurance coverage has been reviewed [employer] Medicare Choice PPO) Patient has primary care physician: Yes Wynelle Link, Charise Carwin, MD) Home environment has been reviewed: From Home Prior level of function:: dependednt  uses walker to ambulate Prior/Current Home Services: Current home services (Has had [facility] in the past) Social Determinants of Health Reivew: SDOH reviewed no interventions necessary Readmission risk has been reviewed: Yes (16%) Transition of care needs: transition of care needs identified, TOC will continue to follow

## 2023-04-09 NOTE — ED Notes (Signed)
ED TO INPATIENT HANDOFF REPORT  ED Nurse Name and Phone #: Lenell Antu Name/Age/Gender Erika Gates 88 y.o. female Room/Bed: TRABC/TRABC  Code Status   Code Status: Full Code  Home/SNF/Other Home Patient oriented to: self, place, time, and situation Is this baseline? Yes   Triage Complete: Triage complete  Chief Complaint Acute respiratory failure with hypoxia and hypercapnia (HCC) [J96.01, J96.02]  Triage Note Pt arrives to ED c/o lethargy and bradycardia that started 2310 while pt was at home. Pt reported to have increased respirations, O2 saturation of 70's on RA and less responsive per family. Pt with HR of 30's and given 1mg  of Atropine at 0046 per EMS with rates improving to 50's. Pt placed on 2L with improvement of saturation to 90's   Allergies Allergies  Allergen Reactions   Cardizem [Diltiazem] Rash   Glucophage [Metformin] Other (See Comments)    "it made me sad"    Level of Care/Admitting Diagnosis ED Disposition     ED Disposition  Admit   Condition  --   Comment  Hospital Area: [facility] [[id number]]  Level of Care: Progressive [102]  Admit to Progressive based on following criteria: CARDIOVASCULAR & THORACIC of moderate stability with acute coronary syndrome symptoms/low risk myocardial infarction/hypertensive urgency/arrhythmias/heart failure potentially compromising stability and stable post cardiovascular intervention patients.  Admit to Progressive based on following criteria: RESPIRATORY PROBLEMS hypoxemic/hypercapnic respiratory failure that is responsive to NIPPV (BiPAP) or High Flow Nasal Cannula (6-80 lpm). Frequent assessment/intervention, no > Q2 hrs < Q4 hrs, to maintain oxygenation and pulmonary hygiene.  May admit patient to [facility] or [facility] if equivalent level of care is available:: No  Covid Evaluation: Asymptomatic - no recent exposure (last 10 days) testing not required  Diagnosis: Acute respiratory failure with  hypoxia and hypercapnia [facility]) [[id number]]  Admitting Physician: Clydie Braun [[id number]]  Attending Physician: Clydie Braun [[id number]]  Certification:: I certify this patient will need inpatient services for at least 2 midnights  Expected Medical Readiness: 04/12/2023          B Medical/Surgery History Past Medical History:  Diagnosis Date   Allergic rhinitis    Anemia    Anxiety    Coronary atherosclerosis of unspecified type of vessel, native or graft    Diabetes mellitus    DJD (degenerative joint disease)    Hypercholesteremia    Hypertension    Low back pain    Morbid obesity (HCC)    Venous insufficiency    Vitamin D deficiency    Past Surgical History:  Procedure Laterality Date   ABDOMINAL HYSTERECTOMY     ANTERIOR CERVICAL DECOMP/DISCECTOMY FUSION N/A 08/14/2022   Procedure: Cerivcal Three-Four, Cerivcal Four-Five, Cerivcal Five-Six Anterior Cervical Discectomy Fusion;  Surgeon: Barnett Abu, MD;  Location: [facility] OR;  Service: Neurosurgery;  Laterality: N/A;  RM 19   CORONARY ARTERY BYPASS GRAFT     3 vessel- Dr. Laneta Simmers 8/96     A IV Location/Drains/Wounds Patient Lines/Drains/Airways Status     Active Line/Drains/Airways     Name Placement date Placement time Site Days   Peripheral IV 04/09/23 20 G Anterior;Left;Proximal Forearm 04/09/23  0121  Forearm  less than 1            Intake/Output Last 24 hours  Intake/Output Summary (Last 24 hours) at 04/09/2023 1341 Last data filed at 04/09/2023 0438 Gross per 24 hour  Intake 315 ml  Output --  Net 315 ml    Labs/Imaging Results  for orders placed or performed during the hospital encounter of 04/09/23 (from the past 48 hour(s))  SARS Coronavirus 2 by RT PCR (hospital order, performed in [facility] hospital lab) *cepheid single result test* Anterior Nasal Swab     Status: None   Collection Time: 04/09/23  1:20 AM   Specimen: Anterior Nasal Swab  Result Value Ref Range   SARS Coronavirus 2 by RT PCR  NEGATIVE NEGATIVE    Comment: Performed at [facility], [address] [place], [place] [address]  Comprehensive metabolic panel     Status: Abnormal   Collection Time: 04/09/23  1:22 AM  Result Value Ref Range   Sodium 139 135 - 145 mmol/L   Potassium 4.2 3.5 - 5.1 mmol/L   Chloride 100 98 - 111 mmol/L   CO2 29 22 - 32 mmol/L   Glucose, Bld 127 (H) 70 - 99 mg/dL    Comment: Glucose reference range applies only to samples taken after fasting for at least 8 hours.   BUN 32 (H) 8 - 23 mg/dL   Creatinine, Ser 1.19 (H) 0.44 - 1.00 mg/dL   Calcium 14.7 8.9 - 82.9 mg/dL   Total Protein 6.0 (L) 6.5 - 8.1 g/dL   Albumin 3.2 (L) 3.5 - 5.0 g/dL   AST 32 15 - 41 U/L   ALT 22 0 - 44 U/L   Alkaline Phosphatase 82 38 - 126 U/L   Total Bilirubin 0.5 0.3 - 1.2 mg/dL   GFR, Estimated 47 (L) >60 mL/min    Comment: (NOTE) Calculated using the CKD-EPI Creatinine Equation (2021)    Anion gap 10 5 - 15    Comment: Performed at [facility], [address] [place], [place] [address]  Troponin I (High Sensitivity)     Status: None   Collection Time: 04/09/23  1:22 AM  Result Value Ref Range   Troponin I (High Sensitivity) 10 <18 ng/L    Comment: (NOTE) Elevated high sensitivity troponin I (hsTnI) values and significant  changes across serial measurements may suggest ACS but many other  chronic and acute conditions are known to elevate hsTnI results.  Refer to the "Links" section for chest pain algorithms and additional  guidance. Performed at [facility], [address], [place], [place] [address]   Brain natriuretic peptide     Status: Abnormal   Collection Time: 04/09/23  1:22 AM  Result Value Ref Range   B Natriuretic Peptide 1,659.1 (H) 0.0 - 100.0 pg/mL    Comment: Performed at [facility], [address], [place], [place] [address]  I-Stat venous blood gas, ED     Status: Abnormal   Collection Time: 04/09/23  1:27 AM  Result Value Ref Range   pH, Ven 7.243  (L) 7.25 - 7.43   pCO2, Ven 79.5 (HH) 44 - 60 mmHg   pO2, Ven 44 32 - 45 mmHg   Bicarbonate 34.3 (H) 20.0 - 28.0 mmol/L   TCO2 37 (H) 22 - 32 mmol/L   O2 Saturation 69 %   Acid-Base Excess 6.0 (H) 0.0 - 2.0 mmol/L   Sodium 140 135 - 145 mmol/L   Potassium 4.2 3.5 - 5.1 mmol/L   Calcium, Ion 1.39 1.15 - 1.40 mmol/L   HCT 22.0 (L) 36.0 - 46.0 %   Hemoglobin 7.5 (L) 12.0 - 15.0 g/dL   Sample type VENOUS    Comment NOTIFIED PHYSICIAN   I-stat chem 8, ed  Status: Abnormal   Collection Time: 04/09/23  1:28 AM  Result Value Ref Range   Sodium 139 135 - 145 mmol/L   Potassium 4.2 3.5 - 5.1 mmol/L   Chloride 100 98 - 111 mmol/L   BUN 34 (H) 8 - 23 mg/dL   Creatinine, Ser 1.61 (H) 0.44 - 1.00 mg/dL   Glucose, Bld 096 (H) 70 - 99 mg/dL    Comment: Glucose reference range applies only to samples taken after fasting for at least 8 hours.   Calcium, Ion 1.39 1.15 - 1.40 mmol/L   TCO2 32 22 - 32 mmol/L   Hemoglobin 7.5 (L) 12.0 - 15.0 g/dL   HCT 04.5 (L) 40.9 - 81.1 %  I-Stat CG4 Lactic Acid     Status: None   Collection Time: 04/09/23  1:29 AM  Result Value Ref Range   Lactic Acid, Venous 0.8 0.5 - 1.9 mmol/L  Urinalysis, w/ Reflex to Culture (Infection Suspected) -Urine, Catheterized     Status: Abnormal   Collection Time: 04/09/23  1:46 AM  Result Value Ref Range   Specimen Source URINE, CATHETERIZED    Color, Urine YELLOW YELLOW   APPearance HAZY (A) CLEAR   Specific Gravity, Urine 1.012 1.005 - 1.030   pH 5.0 5.0 - 8.0   Glucose, UA >=500 (A) NEGATIVE mg/dL   Hgb urine dipstick NEGATIVE NEGATIVE   Bilirubin Urine NEGATIVE NEGATIVE   Ketones, ur NEGATIVE NEGATIVE mg/dL   Protein, ur 30 (A) NEGATIVE mg/dL   Nitrite NEGATIVE NEGATIVE   Leukocytes,Ua NEGATIVE NEGATIVE   RBC / HPF 0-5 0 - 5 RBC/hpf   WBC, UA 0-5 0 - 5 WBC/hpf    Comment:        Reflex urine culture not performed if WBC <=10, OR if Squamous epithelial cells >5. If Squamous epithelial cells >5 suggest  recollection.    Bacteria, UA MANY (A) NONE SEEN   Squamous Epithelial / HPF 0-5 0 - 5 /HPF   Mucus PRESENT    Hyaline Casts, UA PRESENT     Comment: Performed at [facility], [address] [place], [place] [address]  CBC with Differential/Platelet     Status: Abnormal   Collection Time: 04/09/23  2:17 AM  Result Value Ref Range   WBC 3.6 (L) 4.0 - 10.5 K/uL   RBC 2.68 (L) 3.87 - 5.11 MIL/uL   Hemoglobin 4.9 (LL) 12.0 - 15.0 g/dL    Comment: REPEATED TO VERIFY Reticulocyte Hemoglobin testing may be clinically indicated, consider ordering this additional test [id number] THIS CRITICAL RESULT HAS VERIFIED AND BEEN CALLED TO ARNETT COBB RN BY BERTRAM TAYLOR ON 08 16 2024 AT 0244, AND HAS BEEN READ BACK.     HCT 20.3 (L) 36.0 - 46.0 %   MCV 75.7 (L) 80.0 - 100.0 fL   MCH 18.3 (L) 26.0 - 34.0 pg   MCHC 24.1 (L) 30.0 - 36.0 g/dL   RDW 86.5 (H) 78.4 - 69.6 %   Platelets 106 (L) 150 - 400 K/uL   nRBC 1.9 (H) 0.0 - 0.2 %   Neutrophils Relative % 68 %   Neutro Abs 2.5 1.7 - 7.7 K/uL   Lymphocytes Relative 21 %   Lymphs Abs 0.8 0.7 - 4.0 K/uL   Monocytes Relative 9 %   Monocytes Absolute 0.3 0.1 - 1.0 K/uL   Eosinophils Relative 1 %   Eosinophils Absolute 0.0 0.0 - 0.5 K/uL   Basophils Relative 1 %   Basophils Absolute 0.0  0.0 - 0.1 K/uL   Immature Granulocytes 0 %   Abs Immature Granulocytes 0.01 0.00 - 0.07 K/uL    Comment: Performed at [facility], [address] [place], [place] [address]  Type and screen [facility]     Status: None (Preliminary result)   Collection Time: 04/09/23  3:10 AM  Result Value Ref Range   ABO/RH(D) O NEG    Antibody Screen NEG    Sample Expiration 04/12/2023,2359    Unit Number [id number]    Blood Component Type RED CELLS,LR    Unit division 00    Status of Unit ISSUED    Transfusion Status OK TO TRANSFUSE    Crossmatch Result Compatible    Unit Number [id number]    Blood Component Type RED CELLS,LR     Unit division 00    Status of Unit ISSUED    Transfusion Status OK TO TRANSFUSE    Crossmatch Result      Compatible Performed at [facility], [address], [place], [place] [address]   Prepare RBC (crossmatch)     Status: None   Collection Time: 04/09/23  3:15 AM  Result Value Ref Range   Order Confirmation      ORDER PROCESSED BY BLOOD BANK Performed at [facility], [address] [place], [place] [address]   ABO/Rh     Status: None   Collection Time: 04/09/23  3:19 AM  Result Value Ref Range   ABO/RH(D)      O NEG Performed at [facility], [address], [place], [place] [address]   POC occult blood, ED     Status: Abnormal   Collection Time: 04/09/23  3:34 AM  Result Value Ref Range   Fecal Occult Bld POSITIVE (A) NEGATIVE  Troponin I (High Sensitivity)     Status: None   Collection Time: 04/09/23  4:08 AM  Result Value Ref Range   Troponin I (High Sensitivity) 10 <18 ng/L    Comment: (NOTE) Elevated high sensitivity troponin I (hsTnI) values and significant  changes across serial measurements may suggest ACS but many other  chronic and acute conditions are known to elevate hsTnI results.  Refer to the "Links" section for chest pain algorithms and additional  guidance. Performed at [facility], [address], [place], [place] [address]   I-Stat CG4 Lactic Acid     Status: None   Collection Time: 04/09/23  4:25 AM  Result Value Ref Range   Lactic Acid, Venous 0.5 0.5 - 1.9 mmol/L  I-Stat arterial blood gas, ED     Status: Abnormal   Collection Time: 04/09/23  4:50 AM  Result Value Ref Range   pH, Arterial 7.262 (L) 7.35 - 7.45   pCO2 arterial 72.9 (HH) 32 - 48 mmHg   pO2, Arterial 166 (H) 83 - 108 mmHg   Bicarbonate 33.4 (H) 20.0 - 28.0 mmol/L   TCO2 36 (H) 22 - 32 mmol/L   O2 Saturation 99 %   Acid-Base Excess 5.0 (H) 0.0 - 2.0 mmol/L   Sodium 140 135 - 145 mmol/L   Potassium 4.3 3.5 - 5.1 mmol/L   Calcium, Ion 1.42 (H) 1.15 -  1.40 mmol/L   HCT 20.0 (L) 36.0 - 46.0 %   Hemoglobin 6.8 (LL) 12.0 - 15.0 g/dL   Patient temperature 38.1 F    Collection site RADIAL, ALLEN'S TEST ACCEPTABLE    Drawn by RT  Sample type ARTERIAL    Comment NOTIFIED PHYSICIAN   I-Stat venous blood gas, ([facility] ED, [facility], [facility])     Status: Abnormal   Collection Time: 04/09/23 12:10 PM  Result Value Ref Range   pH, Ven 7.344 7.25 - 7.43   pCO2, Ven 58.9 44 - 60 mmHg   pO2, Ven 94 (H) 32 - 45 mmHg   Bicarbonate 32.1 (H) 20.0 - 28.0 mmol/L   TCO2 34 (H) 22 - 32 mmol/L   O2 Saturation 97 %   Acid-Base Excess 5.0 (H) 0.0 - 2.0 mmol/L   Sodium 139 135 - 145 mmol/L   Potassium 4.4 3.5 - 5.1 mmol/L   Calcium, Ion 1.31 1.15 - 1.40 mmol/L   HCT 25.0 (L) 36.0 - 46.0 %   Hemoglobin 8.5 (L) 12.0 - 15.0 g/dL   Sample type VENOUS    CT HEAD WO CONTRAST ( )  Result Date: 04/09/2023 CLINICAL DATA:  Mental status change, unknown cause. EXAM: CT HEAD WITHOUT CONTRAST TECHNIQUE: Contiguous axial images were obtained from the base of the skull through the vertex without intravenous contrast. RADIATION DOSE REDUCTION: This exam was performed according to the departmental dose-optimization program which includes automated exposure control, adjustment of the mA and/or kV according to patient size and/or use of iterative reconstruction technique. COMPARISON:  07/31/2022. FINDINGS: Brain: No acute intracranial hemorrhage, midline shift or mass effect. No extra-axial fluid collection. Periventricular white matter hypodensities are noted bilaterally. No hydrocephalus. A stable dural-based is noted over the frontal bone on the right, possible calcified meningioma, and unchanged from the prior exam. Vascular: No hyperdense vessel or unexpected calcification. Skull: Normal. Negative for fracture or focal lesion. Sinuses/Orbits: No acute finding. Other: None. IMPRESSION: 1. No acute intracranial process. 2. Chronic microvascular ischemic changes. 3. Stable extra-axial  calcification over the frontal lobe on the right, possible meningioma and unchanged from the previous exam. Electronically Signed   By: Thornell Sartorius M.D.   On: 04/09/2023 02:44   DG Chest Port 1 View  Result Date: 04/09/2023 CLINICAL DATA:  Shortness of breath EXAM: PORTABLE CHEST 1 VIEW COMPARISON:  08/16/2022 FINDINGS: Check shadow is enlarged but stable. Postsurgical changes are again seen. Elevation of the right hemidiaphragm is noted. Central vascular congestion is seen without significant edema. No focal infiltrate is noted. Postsurgical changes in the cervical spine are seen. IMPRESSION: Central vascular congestion without significant edema. Electronically Signed   By: Alcide Clever M.D.   On: 04/09/2023 01:24    Pending Labs Unresulted Labs (From admission, onward)     Start     Ordered   04/10/23 0500  CBC  Tomorrow morning,   R        04/09/23 0943   04/10/23 0500  Basic metabolic panel  Tomorrow morning,   R        04/09/23 0943   04/09/23 1600  Hemoglobin and hematocrit, blood  Once-Timed,   TIMED        04/09/23 1217   04/09/23 1221  Urine Culture (for pregnant, neutropenic or urologic patients or patients with an indwelling urinary catheter)  (Urine Labs)  Once,   R       Question:  Indication  Answer:  Altered mental status (if no other cause identified)   04/09/23 1220   04/09/23 1148  Blood gas, venous  Once,   R        04/09/23 1147   04/09/23 0101  CBC with Differential/Platelet  ONCE - STAT,   STAT  04/09/23 0100            Vitals/Pain Today's Vitals   04/09/23 1145 04/09/23 1154 04/09/23 1215 04/09/23 1230  BP: (!) 108/54 (!) 108/54 (!) 101/46 (!) 105/42  Pulse: (!) 54 (!) 54 (!) 52 (!) 50  Resp: 16 20    Temp:  (!) 96.6 F (35.9 C)    TempSrc:  Axillary    SpO2: 100% 100% 100% 100%  PainSc:        Isolation Precautions Airborne and Contact precautions  Medications Medications  sodium chloride flush (NS) 0.9 % injection 3 mL (3 mLs  Intravenous Given 04/09/23 1203)  acetaminophen (TYLENOL) tablet 650 mg (has no administration in time range)    Or  acetaminophen (TYLENOL) suppository 650 mg (has no administration in time range)  albuterol (PROVENTIL) (2.5 MG/3ML) 0.083% nebulizer solution 2.5 mg (has no administration in time range)  rosuvastatin (CRESTOR) tablet 20 mg (has no administration in time range)  pantoprazole (PROTONIX) injection 40 mg (40 mg Intravenous Given 04/09/23 1203)  lidocaine (LIDODERM) 5 % 1 patch (has no administration in time range)  furosemide (LASIX) injection 40 mg (has no administration in time range)  cefTRIAXone (ROCEPHIN) 2 g in sodium chloride 0.9 % 100 mL IVPB (0 g Intravenous Stopped 04/09/23 1320)  0.9 %  sodium chloride infusion (Manually program via Guardrails IV Fluids) (0 mLs Intravenous Stopped 04/09/23 0800)  furosemide (LASIX) injection 40 mg (40 mg Intravenous Given 04/09/23 0453)    Mobility walks with device     Focused Assessments Pulmonary Assessment Handoff:     R Recommendations: See Admitting Provider Note  Report given to:   Additional Notes:

## 2023-04-09 NOTE — H&P (Addendum)
History and Physical    Patient: Erika Gates:[id number] DOB: Nov 01, 1934 DOA: 04/09/2023 DOS: the patient was seen and examined on 04/09/2023 PCP: [name], MD  Patient coming from: Home via EMS  Chief Complaint:  Chief Complaint  Patient presents with   Bradycardia   Fatigue   HPI: Erika Gates is a 88 y.o. female with medical history significant of hypertension, hyperlipidemia, paroxysmal atrial fibrillation, CAD s/p CABG in 1996, diastolic CHF, neurologic myopathy with quadriparesis requiring cervical decompression 07/2022, diabetes mellitus type 2, and morbid obesity who presented from home after being noted to be more lethargic last night.  History is obtained mostly from the patient's daughter as she is currently on BiPAP.  Patient is normally not on oxygen at baseline and ambulates with the use of a walker.  She has been in her normal state of health until until yesterday evening when she was noted to be more somnolent and difficult to arouse.  Also reported that she had developed a cough yesterday evening.  She had not seen her primary care provider for weeks ago for increasing swelling and had been placed on a increased dose of Lasix every other day.  Patient denies having any recent fever, chest pain, abdominal pain, nausea, vomiting, or blood in her stools.  Her daughter cannot recall when her last colonoscopy was.  Patient was not noted to be hypotensive but in route with EMS had heart rates around 35 documented as sinus bradycardia for which patient was given atropine 1 mg x 1 dose with improvement to the 50s.  O2 saturations 90s with improvement on 2 L nasal cannula oxygen.  On admission temperature 92.5 F, pulse 43-89, respirations 23, blood pressure as low as 92/44, and O2 saturations initially noted to be as low as 88% with improvement on BiPAP.  Arterial blood gas significant for pH 7.262, pCO2 72.9, and pO2 166.  Labs significant for WBC 3.6, hemoglobin 4.9 with  low MCV and MCH, platelets 106, BUN 32, creatinine 1.13, BNP 1659.1, high-sensitivity troponin 10, and lactic acid 0.8.  Chest x-ray noted central vascular congestion without significant edema.  Stool guaiacs were positive.  Patient was typed and screened and ordered to be transfused 2 units of packed red blood cells.   Review of Systems: unable to review all systems due to the inability of the patient to answer questions. Past Medical History:  Diagnosis Date   Allergic rhinitis    Anemia    Anxiety    Coronary atherosclerosis of unspecified type of vessel, native or graft    Diabetes mellitus    DJD (degenerative joint disease)    Hypercholesteremia    Hypertension    Low back pain    Morbid obesity (HCC)    Venous insufficiency    Vitamin D deficiency    Past Surgical History:  Procedure Laterality Date   ABDOMINAL HYSTERECTOMY     ANTERIOR CERVICAL DECOMP/DISCECTOMY FUSION N/A 08/14/2022   Procedure: Cerivcal Three-Four, Cerivcal Four-Five, Cerivcal Five-Six Anterior Cervical Discectomy Fusion;  Surgeon: Barnett Abu, MD;  Location: [facility] OR;  Service: Neurosurgery;  Laterality: N/A;  RM 19   CORONARY ARTERY BYPASS GRAFT     3 vessel- Dr. Laneta Simmers 8/96   Social History:  reports that she has never smoked. She has never used smokeless tobacco. She reports that she does not drink alcohol and does not use drugs.  Allergies  Allergen Reactions   Diltiazem Rash   Metformin Other (See Comments)  REACTION: pt refuses METFORMIN "it made me sad"...    Family History  Problem Relation Age of Onset   Heart disease Mother    Heart disease Brother     Prior to Admission medications   Medication Sig Start Date End Date Taking? Authorizing Provider  ACCU-CHEK GUIDE test strip USE TO CHECK BLOOD SUGAR IN VITRO ONCE A DAY 90 DAYS 12/10/22   [provider]  Accu-Chek Softclix Lancets lancets SMARTSIG:stick Topical Daily 11/21/22   [provider]  acetaminophen (TYLENOL)  500 MG tablet Take 1,000 mg by mouth every 6 (six) hours as needed for mild pain.    [provider]  ammonium lactate (LAC-HYDRIN) 12 % lotion SMARTSIG:Topical Every 12 Hours    [provider]  apixaban (ELIQUIS) 5 MG TABS tablet Take 1 tablet (5 mg total) by mouth 2 (two) times daily. 11/23/22   Dyann Kief, PA-C  Cholecalciferol (VITAMIN D) 1000 UNITS capsule Take 2,000 Units by mouth daily.     [provider]  clonazePAM (KLONOPIN) 0.5 MG disintegrating tablet Take 1 tablet (0.5 mg total) by mouth 2 (two) times daily as needed (anxiety). 08/27/22   Rai, Delene Ruffini, MD  dapagliflozin propanediol (FARXIGA) 10 MG TABS tablet Take 1 tablet (10 mg total) by mouth daily before breakfast. 02/02/22   Lyn Records, MD  diclofenac sodium (VOLTAREN) 1 % GEL Apply 2 g topically daily as needed (pain). 08/27/22   Rai, Delene Ruffini, MD  docusate sodium (COLACE) 100 MG capsule Take 1 capsule (100 mg total) by mouth 2 (two) times daily. 08/27/22   Rai, Delene Ruffini, MD  folic acid (FOLVITE) 1 MG tablet Take 1 tablet (1 mg total) by mouth daily. 08/27/22   Rai, Delene Ruffini, MD  furosemide (LASIX) 20 MG tablet Take 1 tablet (20 mg total) by mouth daily. 07/18/21 08/11/22  Meredeth Ide, MD  gabapentin (NEURONTIN) 100 MG capsule Take 1 capsule (100 mg total) by mouth at bedtime. Patient taking differently: Take 100 mg by mouth in the morning. 03/17/18   Gearldine Bienenstock, PA-C  lidocaine 4 % Place onto the skin. 09/25/22   [provider]  lisinopril (ZESTRIL) 20 MG tablet Take 1 tablet (20 mg total) by mouth daily. 07/18/21 08/11/22  Meredeth Ide, MD  metoprolol succinate (TOPROL-XL) 100 MG 24 hr tablet Take 1 tablet (100 mg total) by mouth daily. 10/27/22   Dyann Kief, PA-C  Multiple Vitamins-Minerals (PROTEGRA CARDIO PO) Take 1 capsule by mouth daily.      [provider]  pantoprazole (PROTONIX) 40 MG tablet Take 1 tablet (40 mg total) by mouth daily. 08/27/22   Rai, Ripudeep  K, MD  polyethylene glycol (MIRALAX / GLYCOLAX) 17 g packet Take by mouth. 09/25/22   [provider]  rosuvastatin (CRESTOR) 20 MG tablet Take 1 tablet (20 mg total) by mouth daily. 01/12/23   Jake Bathe, MD  sodium chloride (OCEAN) 0.65 % SOLN nasal spray Place 1 spray into both nostrils as needed for congestion. 08/27/22   Rai, Delene Ruffini, MD  vitamin E 100 UNIT capsule Take 100 Units by mouth daily.    [provider]    Physical Exam: Vitals:   04/09/23 0645 04/09/23 0700 04/09/23 0715 04/09/23 0800  BP: 110/63 (!) 107/50 (!) 106/53 (!) 108/51  Pulse: (!) 51 (!) 54 (!) 56 62  Resp: 17 17 (!) 23 20  Temp:    98.2 F (36.8 C)  TempSrc:  SpO2: 100% 100% 100% 100%    Constitutional: Elderly female who appears to be no acute distress Eyes: PERRL, lids and conjunctivae normal ENMT: Mucous membranes are moist.   Neck: normal, supple  Respiratory: Decreased overall aeration currently on BiPAP with O2 saturations maintained. Cardiovascular: Bradycardic with at least 2+ pitting bilateral lower extremity  edema. 2+ pedal pulses.   Abdomen: no tenderness, no masses palpated.  Bowel sounds positive.  Musculoskeletal: no clubbing / cyanosis. No joint deformity upper and lower extremities. Good ROM, no contractures. Normal muscle tone.  Skin: no rashes, lesions, ulcers. No induration Neurologic: CN 2-12 grossly intact.  Strength 5/5 in all 4.  Psychiatric: Normal judgment and insight.  Alert and oriented to person and place at this time.  Normal mood.   Data Reviewed:  EKG revealed sinus bradycardia at 48 bpm.  Reviewed labs, imaging, and pertinent records as noted above in HPI.  Assessment and Plan:   Acute respiratory failure with hypoxia and hypercapnia secondary to acute on chronic diastolic congestive heart failure Patient was noted to be significantly lethargic by family yesterday evening. ABG significant for significant for pH 7.262, pCO2 72.9, and pO2 166.   She had been placed on BiPAP with improvement in symptoms.  Chest x-ray noting central venous congestion without frank edema.  BNP elevated at 1659.1.  Last echocardiogram noted 60 to 65% with grade 1 diastolic dysfunction.  EF patient had been given Lasix 40 mg IV. -Admit to a progressive bed -Continuous pulse oximetry with oxygen and to maintain O2 saturations greater than 90% -BiPAP.  Recheck venous blood gas to transition off of BiPAP -Strict I&Os and daily weights -Albuterol nebs as needed for shortness of breath/wheezing -Check echocardiogram.  Consider formally consulting cardiology if needed based off echocardiogram -Continue Lasix 40 mg IV twice daily as blood pressures will allow  Acute blood loss anemia secondary to GI bleed Hemoglobin 7.5 1 H&H and 4.9 on CBC with positive stool guaiac.  Previously hemoglobin noted to be around 10.  Patient had been typed and screened and ordered 2 units of packed red blood cells.  Most recent H&H 6.8 after 1 unit of PRBCs. -Continue with transfusion of second unit of packed red blood cells -Recheck H&H posttransfusion transfuse additional unit of PRBCs globin greater than 8 g/dL given heart history -Protonix IV -Will consider CT scan of the abdomen pelvis for bleeding if blood counts continue to drop -[facility] consulted, will follow-up for any further recommendations  Sinus bradycardia With EMS heart rates were reported to be in the 30s for which she was given atropine prior to arrival.  Home medication regimen includes metoprolol succinate 100 mg daily with last dose was yesterday.  Heart rates currently in the 50s to 60s. -Holding metoprolol -Consider additional atropine if needed  Transient hypotension On admission blood pressures noted to be as low as 98/45.  Thought secondary to acute blood loss. -Hold home blood pressure regimen -Goal MAP greater than 65  SIRS Abnormal urinalysis Patient was noted to be hypothermic initially placed on  Bair hugger with WBC 3.5.  Urine was positive for glucose with many bacteria seen,  but no significant WBCs -Check urine culture -Rocephin IV empirically  Pancytopenia Acute on chronic.  White blood cell count 3.6 with platelet count 107.  platelet counts previously have intermittently been low in the past. -Recheck CBC tomorrow morning  Paroxysmal atrial fibrillation on chronic anticoagulation Patient noted to be in sinus rhythm.  Patient with prior history of  atrial fibrillation after PEA arrest last year.  She has been on Eliquis ever since with last dose taken yesterday evening. -Hold Eliquis -Continue to monitor  Acute metabolic encephalopathy Patient was noted to be significantly lethargic for which family became concerned.  Suspect secondary to hypercapnia as patient more alert oriented since being on BiPAP -Neurochecks  Renal insufficiency On admission BUN 32 and creatinine 1.13.  Elevated BUN to creatinine ratio suggest prerenal cause.  Baseline creatinine previously noted around 0.85-0.96.  This is not greater than 0.3 increased to suggest acute kidney injury. -Continue to kidney function  Diabetes mellitus type 2, without long-term use of insulin On admission blood sugar was not significantly elevated. -Holding Marcelline Deist due to possible UTI as recommended by pharmacy  Hyperlipidemia -Continue statin  Genella Rife -Protoniniix IV   DVT prophylaxis: SCDs Advance Care Planning:   Code Status: Full Code    Consults: [facility]  Family Communication: Family updated sat bedside  Severity of Illness: The appropriate patient status for this patient is INPATIENT. Inpatient status is judged to be reasonable and necessary in order to provide the required intensity of service to ensure the patient's safety. The patient's presenting symptoms, physical exam findings, and initial radiographic and laboratory data in the context of their chronic comorbidities is felt to place them at high risk  for further clinical deterioration. Furthermore, it is not anticipated that the patient will be medically stable for discharge from the hospital within 2 midnights of admission.   * I certify that at the point of admission it is my clinical judgment that the patient will require inpatient hospital care spanning beyond 2 midnights from the point of admission due to high intensity of service, high risk for further deterioration and high frequency of surveillance required.*  Author: Clydie Braun, MD 04/09/2023 8:28 AM  For on call review [email or web address].

## 2023-04-09 NOTE — ED Provider Notes (Signed)
[facility] AT [facility] Provider Note   CSN: [id number] Arrival date & time: 04/09/23  0055     History  Chief Complaint  Patient presents with   Bradycardia   Fatigue    Erika Gates is a 88 y.o. female.  Brought to the emergency department by ambulance from home.  Family called EMS for mental status changes.  Patient was noted to be somewhat lethargic at home.  EMS report bradycardia during transport.  Heart rate was around 35, sinus bradycardia.  No hypotension.  She was treated with atropine and heart rate is in the 40s.       Home Medications Prior to Admission medications   Medication Sig Start Date End Date Taking? Authorizing Provider  ACCU-CHEK GUIDE test strip USE TO CHECK BLOOD SUGAR IN VITRO ONCE A DAY 90 DAYS 12/10/22   [provider]  Accu-Chek Softclix Lancets lancets SMARTSIG:stick Topical Daily 11/21/22   [provider]  acetaminophen (TYLENOL) 500 MG tablet Take 1,000 mg by mouth every 6 (six) hours as needed for mild pain.    [provider]  ammonium lactate (LAC-HYDRIN) 12 % lotion SMARTSIG:Topical Every 12 Hours    [provider]  apixaban (ELIQUIS) 5 MG TABS tablet Take 1 tablet (5 mg total) by mouth 2 (two) times daily. 11/23/22   Dyann Kief, PA-C  Cholecalciferol (VITAMIN D) 1000 UNITS capsule Take 2,000 Units by mouth daily.     [provider]  clonazePAM (KLONOPIN) 0.5 MG disintegrating tablet Take 1 tablet (0.5 mg total) by mouth 2 (two) times daily as needed (anxiety). 08/27/22   Rai, Delene Ruffini, MD  dapagliflozin propanediol (FARXIGA) 10 MG TABS tablet Take 1 tablet (10 mg total) by mouth daily before breakfast. 02/02/22   Lyn Records, MD  diclofenac sodium (VOLTAREN) 1 % GEL Apply 2 g topically daily as needed (pain). 08/27/22   Rai, Delene Ruffini, MD  docusate sodium (COLACE) 100 MG capsule Take 1 capsule (100 mg total) by mouth 2 (two) times daily. 08/27/22   Rai,  Delene Ruffini, MD  folic acid (FOLVITE) 1 MG tablet Take 1 tablet (1 mg total) by mouth daily. 08/27/22   Rai, Delene Ruffini, MD  furosemide (LASIX) 20 MG tablet Take 1 tablet (20 mg total) by mouth daily. 07/18/21 08/11/22  Meredeth Ide, MD  gabapentin (NEURONTIN) 100 MG capsule Take 1 capsule (100 mg total) by mouth at bedtime. Patient taking differently: Take 100 mg by mouth in the morning. 03/17/18   Gearldine Bienenstock, PA-C  lidocaine 4 % Place onto the skin. 09/25/22   [provider]  lisinopril (ZESTRIL) 20 MG tablet Take 1 tablet (20 mg total) by mouth daily. 07/18/21 08/11/22  Meredeth Ide, MD  metoprolol succinate (TOPROL-XL) 100 MG 24 hr tablet Take 1 tablet (100 mg total) by mouth daily. 10/27/22   Dyann Kief, PA-C  Multiple Vitamins-Minerals (PROTEGRA CARDIO PO) Take 1 capsule by mouth daily.      [provider]  pantoprazole (PROTONIX) 40 MG tablet Take 1 tablet (40 mg total) by mouth daily. 08/27/22   Rai, Ripudeep K, MD  polyethylene glycol (MIRALAX / GLYCOLAX) 17 g packet Take by mouth. 09/25/22   [provider]  rosuvastatin (CRESTOR) 20 MG tablet Take 1 tablet (20 mg total) by mouth daily. 01/12/23   Jake Bathe, MD  sodium chloride (OCEAN) 0.65 % SOLN nasal spray Place 1 spray into both nostrils as needed  for congestion. 08/27/22   Rai, Delene Ruffini, MD  vitamin E 100 UNIT capsule Take 100 Units by mouth daily.    [provider]      Allergies    Diltiazem and Metformin    Review of Systems   Review of Systems  Physical Exam Updated Vital Signs BP (!) 108/47   Pulse (!) 43   Temp (!) 92.5 F (33.6 C) (Rectal)   Resp 17   SpO2 100%  Physical Exam Vitals and nursing note reviewed.  Constitutional:      Appearance: She is well-developed.  HENT:     Head: Normocephalic and atraumatic.     Mouth/Throat:     Mouth: Mucous membranes are moist.  Eyes:     General: Vision grossly intact. Gaze aligned appropriately.     Extraocular Movements:  Extraocular movements intact.     Conjunctiva/sclera: Conjunctivae normal.  Cardiovascular:     Rate and Rhythm: Regular rhythm. Bradycardia present.     Pulses: Normal pulses.     Heart sounds: Normal heart sounds, S1 normal and S2 normal. No murmur heard.    No friction rub. No gallop.  Pulmonary:     Effort: Pulmonary effort is normal. No respiratory distress.     Breath sounds: Decreased air movement present. Decreased breath sounds present.  Abdominal:     General: Bowel sounds are normal.     Palpations: Abdomen is soft.     Tenderness: There is no abdominal tenderness. There is no guarding or rebound.     Hernia: No hernia is present.  Musculoskeletal:        General: No swelling.     Cervical back: Full passive range of motion without pain, normal range of motion and neck supple. No spinous process tenderness or muscular tenderness. Normal range of motion.     Right lower leg: Edema present.     Left lower leg: Edema present.  Skin:    General: Skin is warm and dry.     Capillary Refill: Capillary refill takes less than 2 seconds.     Findings: No ecchymosis, erythema, rash or wound.  Neurological:     General: No focal deficit present.     Mental Status: She is lethargic.     Cranial Nerves: Cranial nerves 2-12 are intact.     Sensory: Sensation is intact.     Motor: Motor function is intact.     Coordination: Coordination is intact.  Psychiatric:        Attention and Perception: Attention normal.        Mood and Affect: Mood normal.        Speech: Speech normal.        Behavior: Behavior normal.     ED Results / Procedures / Treatments   Labs (all labs ordered are listed, but only abnormal results are displayed) Labs Reviewed  COMPREHENSIVE METABOLIC PANEL - Abnormal; Notable for the following components:      Result Value   Glucose, Bld 127 (*)    BUN 32 (*)    Creatinine, Ser 1.13 (*)    Total Protein 6.0 (*)    Albumin 3.2 (*)    GFR, Estimated 47 (*)     All other components within normal limits  URINALYSIS, W/ REFLEX TO CULTURE (INFECTION SUSPECTED) - Abnormal; Notable for the following components:   APPearance HAZY (*)    Glucose, UA >=500 (*)    Protein, ur 30 (*)    Bacteria,  UA MANY (*)    All other components within normal limits  BRAIN NATRIURETIC PEPTIDE - Abnormal; Notable for the following components:   B Natriuretic Peptide 1,659.1 (*)    All other components within normal limits  CBC WITH DIFFERENTIAL/PLATELET - Abnormal; Notable for the following components:   WBC 3.6 (*)    RBC 2.68 (*)    Hemoglobin 4.9 (*)    HCT 20.3 (*)    MCV 75.7 (*)    MCH 18.3 (*)    MCHC 24.1 (*)    RDW 20.2 (*)    Platelets 106 (*)    nRBC 1.9 (*)    All other components within normal limits  I-STAT CHEM 8, ED - Abnormal; Notable for the following components:   BUN 34 (*)    Creatinine, Ser 1.20 (*)    Glucose, Bld 122 (*)    Hemoglobin 7.5 (*)    HCT 22.0 (*)    All other components within normal limits  I-STAT VENOUS BLOOD GAS, ED - Abnormal; Notable for the following components:   pH, Ven 7.243 (*)    pCO2, Ven 79.5 (*)    Bicarbonate 34.3 (*)    TCO2 37 (*)    Acid-Base Excess 6.0 (*)    HCT 22.0 (*)    Hemoglobin 7.5 (*)    All other components within normal limits  POC OCCULT BLOOD, ED - Abnormal; Notable for the following components:   Fecal Occult Bld POSITIVE (*)    All other components within normal limits  SARS CORONAVIRUS 2 BY RT PCR  CBC WITH DIFFERENTIAL/PLATELET  I-STAT CG4 LACTIC ACID, ED  I-STAT CG4 LACTIC ACID, ED  I-STAT ARTERIAL BLOOD GAS, ED  TYPE AND SCREEN  PREPARE RBC (CROSSMATCH)  ABO/RH  TROPONIN I (HIGH SENSITIVITY)  TROPONIN I (HIGH SENSITIVITY)    EKG EKG Interpretation Date/Time:  Friday April 09 2023 01:18:52 EDT Ventricular Rate:  48 PR Interval:  158 QRS Duration:  161 QT Interval:  513 QTC Calculation: 459 R Axis:   66  Text Interpretation: Sinus bradycardia Right bundle  branch block Confirmed by Gilda Crease [phone]) on 04/09/2023 2:13:46 AM  Radiology CT HEAD WO CONTRAST ( )  Result Date: 04/09/2023 CLINICAL DATA:  Mental status change, unknown cause. EXAM: CT HEAD WITHOUT CONTRAST TECHNIQUE: Contiguous axial images were obtained from the base of the skull through the vertex without intravenous contrast. RADIATION DOSE REDUCTION: This exam was performed according to the departmental dose-optimization program which includes automated exposure control, adjustment of the mA and/or kV according to patient size and/or use of iterative reconstruction technique. COMPARISON:  07/31/2022. FINDINGS: Brain: No acute intracranial hemorrhage, midline shift or mass effect. No extra-axial fluid collection. Periventricular white matter hypodensities are noted bilaterally. No hydrocephalus. A stable dural-based is noted over the frontal bone on the right, possible calcified meningioma, and unchanged from the prior exam. Vascular: No hyperdense vessel or unexpected calcification. Skull: Normal. Negative for fracture or focal lesion. Sinuses/Orbits: No acute finding. Other: None. IMPRESSION: 1. No acute intracranial process. 2. Chronic microvascular ischemic changes. 3. Stable extra-axial calcification over the frontal lobe on the right, possible meningioma and unchanged from the previous exam. Electronically Signed   By: Thornell Sartorius M.D.   On: 04/09/2023 02:44   DG Chest Port 1 View  Result Date: 04/09/2023 CLINICAL DATA:  Shortness of breath EXAM: PORTABLE CHEST 1 VIEW COMPARISON:  08/16/2022 FINDINGS: Check shadow is enlarged but stable. Postsurgical changes are again seen. Elevation of the  right hemidiaphragm is noted. Central vascular congestion is seen without significant edema. No focal infiltrate is noted. Postsurgical changes in the cervical spine are seen. IMPRESSION: Central vascular congestion without significant edema. Electronically Signed   By: Alcide Clever M.D.    On: 04/09/2023 01:24    Procedures .Critical Care  Performed by: Gilda Crease, MD Authorized by: Gilda Crease, MD   Critical care provider statement:    Critical care time (minutes):  30   Critical care was necessary to treat or prevent imminent or life-threatening deterioration of the following conditions:  CNS failure or compromise and respiratory failure   Critical care was time spent personally by me on the following activities:  Development of treatment plan with patient or surrogate, discussions with consultants, evaluation of patient's response to treatment, examination of patient, ordering and review of laboratory studies, ordering and review of radiographic studies, ordering and performing treatments and interventions, pulse oximetry, re-evaluation of patient's condition and review of old charts   I assumed direction of critical care for this patient from another provider in my specialty: no     Care discussed with: admitting provider       Medications Ordered in ED Medications  0.9 %  sodium chloride infusion (Manually program via Guardrails IV Fluids) (has no administration in time range)  furosemide (LASIX) injection 40 mg (has no administration in time range)    ED Course/ Medical Decision Making/ A&P                                 Medical Decision Making Amount and/or Complexity of Data Reviewed Independent Historian: caregiver and EMS External Data Reviewed: labs, ECG and notes. Labs: ordered. Decision-making details documented in ED Course. Radiology: ordered and independent interpretation performed. Decision-making details documented in ED Course. ECG/medicine tests: ordered and independent interpretation performed. Decision-making details documented in ED Course.  Risk Prescription drug management. Decision regarding hospitalization.   Differential diagnosis considered includes, but not limited to: TIA; Stroke; ICH; Seizure; electrolyte  abnormality; hypoglycemia; toxic/pharmacologic causes; CNS infection; psychiatric disorder  Brought to the emerged department by EMS from home.  Family accompanies the patient to the ED.  She was in her normal state of health most of the day and then tonight she was noted to have a change in her mental status.  EMS reports that she had some labored breathing and has been lethargic.  Patient also noted to be bradycardic.  She was given atropine with some improvement.  On arrival, patient is able to answer questions but is not alert.  She is found to be hypothermic.  Continues to be bradycardic.  Patient is borderline hypotensive.  Blood gas shows hypercarbia which may explain some of her altered mental status.  CT head unremarkable.  EMS reports that room air oxygen saturations were in the 70s.  She does have oxygen at home but family reports that she has not needed to use it in months.  Chest x-ray does not show significant pathology.  No pneumonia.  Some congestion.  Patient clinically does look volume overloaded.  Patient has a history of chronic anemia.  Her i-STAT hemoglobin was 7.5 but when she had a formal CBC performed, hemoglobin was 5.1.  This was redrawn and rerun, repeat was 4.9.  Patient will therefore be given blood transfusion.  IV diuresis with Lasix.  Patient placed on BiPAP for her hypercarbia.  She definitely has  chronic CO2 retention as her pH is 7.262, not nearly as low as I would expect for her CO2 levels.  She does awaken to voice, answer questions.  Continue BiPAP, monitor.         Final Clinical Impression(s) / ED Diagnoses Final diagnoses:  Acute on chronic respiratory failure with hypoxia and hypercapnia (HCC)  Bradycardia  Anemia, unspecified type  Hypothermia, initial encounter    Rx / DC Orders ED Discharge Orders     None         Gem Conkle, Canary Brim, MD 04/09/23 [phone]

## 2023-04-10 ENCOUNTER — Inpatient Hospital Stay (HOSPITAL_COMMUNITY): Payer: Medicare PPO

## 2023-04-10 DIAGNOSIS — J9602 Acute respiratory failure with hypercapnia: Secondary | ICD-10-CM | POA: Diagnosis not present

## 2023-04-10 DIAGNOSIS — J9601 Acute respiratory failure with hypoxia: Secondary | ICD-10-CM | POA: Diagnosis not present

## 2023-04-10 LAB — CBC
HCT: 29.1 % — ABNORMAL LOW (ref 36.0–46.0)
HCT: 30 % — ABNORMAL LOW (ref 36.0–46.0)
HCT: 30.6 % — ABNORMAL LOW (ref 36.0–46.0)
Hemoglobin: 8.1 g/dL — ABNORMAL LOW (ref 12.0–15.0)
Hemoglobin: 8.3 g/dL — ABNORMAL LOW (ref 12.0–15.0)
Hemoglobin: 8.7 g/dL — ABNORMAL LOW (ref 12.0–15.0)
MCH: 21.8 pg — ABNORMAL LOW (ref 26.0–34.0)
MCH: 22.2 pg — ABNORMAL LOW (ref 26.0–34.0)
MCH: 22.4 pg — ABNORMAL LOW (ref 26.0–34.0)
MCHC: 27.7 g/dL — ABNORMAL LOW (ref 30.0–36.0)
MCHC: 27.8 g/dL — ABNORMAL LOW (ref 30.0–36.0)
MCHC: 28.4 g/dL — ABNORMAL LOW (ref 30.0–36.0)
MCV: 78.4 fL — ABNORMAL LOW (ref 80.0–100.0)
MCV: 78.9 fL — ABNORMAL LOW (ref 80.0–100.0)
MCV: 80.2 fL (ref 80.0–100.0)
Platelets: 104 10*3/uL — ABNORMAL LOW (ref 150–400)
Platelets: 94 10*3/uL — ABNORMAL LOW (ref 150–400)
Platelets: 97 10*3/uL — ABNORMAL LOW (ref 150–400)
RBC: 3.71 MIL/uL — ABNORMAL LOW (ref 3.87–5.11)
RBC: 3.74 MIL/uL — ABNORMAL LOW (ref 3.87–5.11)
RBC: 3.88 MIL/uL (ref 3.87–5.11)
RDW: 18.8 % — ABNORMAL HIGH (ref 11.5–15.5)
RDW: 18.8 % — ABNORMAL HIGH (ref 11.5–15.5)
RDW: 19.2 % — ABNORMAL HIGH (ref 11.5–15.5)
WBC: 5.8 10*3/uL (ref 4.0–10.5)
WBC: 6.1 10*3/uL (ref 4.0–10.5)
WBC: 6.6 10*3/uL (ref 4.0–10.5)
nRBC: 1.3 % — ABNORMAL HIGH (ref 0.0–0.2)
nRBC: 1.7 % — ABNORMAL HIGH (ref 0.0–0.2)
nRBC: 1.9 % — ABNORMAL HIGH (ref 0.0–0.2)

## 2023-04-10 LAB — BASIC METABOLIC PANEL
Anion gap: 9 (ref 5–15)
BUN: 36 mg/dL — ABNORMAL HIGH (ref 8–23)
CO2: 27 mmol/L (ref 22–32)
Calcium: 9.7 mg/dL (ref 8.9–10.3)
Chloride: 102 mmol/L (ref 98–111)
Creatinine, Ser: 1.25 mg/dL — ABNORMAL HIGH (ref 0.44–1.00)
GFR, Estimated: 42 mL/min — ABNORMAL LOW (ref >60)
Glucose, Bld: 82 mg/dL (ref 70–99)
Potassium: 4.3 mmol/L (ref 3.5–5.1)
Sodium: 138 mmol/L (ref 135–145)

## 2023-04-10 LAB — GLUCOSE, CAPILLARY
Glucose-Capillary: 109 mg/dL — ABNORMAL HIGH (ref 70–99)
Glucose-Capillary: 114 mg/dL — ABNORMAL HIGH (ref 70–99)

## 2023-04-10 LAB — TSH: TSH: 6.567 u[IU]/mL — ABNORMAL HIGH (ref 0.350–4.500)

## 2023-04-10 LAB — HEMOGLOBIN A1C
Hgb A1c MFr Bld: 6.1 % — ABNORMAL HIGH (ref 4.8–5.6)
Mean Plasma Glucose: 128.37 mg/dL

## 2023-04-10 LAB — T4, FREE: Free T4: 0.81 ng/dL (ref 0.61–1.12)

## 2023-04-10 LAB — C-REACTIVE PROTEIN: CRP: 0.5 mg/dL (ref <1.0)

## 2023-04-10 LAB — BRAIN NATRIURETIC PEPTIDE: B Natriuretic Peptide: 715.5 pg/mL — ABNORMAL HIGH (ref 0.0–100.0)

## 2023-04-10 LAB — PROCALCITONIN: Procalcitonin: <0.1 ng/mL

## 2023-04-10 MED ORDER — PANTOPRAZOLE SODIUM 40 MG PO TBEC: 40.0000 mg | DELAYED_RELEASE_TABLET | Freq: Every day | ORAL | Status: DC

## 2023-04-10 MED ORDER — POLYETHYLENE GLYCOL 3350 17 G PO PACK: 17.0000 g | PACK | Freq: Every day | ORAL | Status: DC | PRN

## 2023-04-10 MED ORDER — DOCUSATE SODIUM 100 MG PO CAPS: 100.0000 mg | ORAL_CAPSULE | Freq: Every day | ORAL | Status: DC | PRN

## 2023-04-10 MED ORDER — PANTOPRAZOLE SODIUM 40 MG PO TBEC
40.0000 mg | DELAYED_RELEASE_TABLET | Freq: Two times a day (BID) | ORAL | Status: DC
  Administered 2023-04-10 – 2023-04-22 (×24): 40 mg via ORAL
  Filled 2023-04-10 (×24): qty 1

## 2023-04-10 MED ORDER — INSULIN ASPART 100 UNIT/ML IJ SOLN
0.0000 [IU] | Freq: Three times a day (TID) | INTRAMUSCULAR | Status: DC
  Administered 2023-04-13 – 2023-04-18 (×3): 1 [IU] via SUBCUTANEOUS

## 2023-04-10 MED ORDER — SODIUM CHLORIDE 0.9 % IV SOLN: INTRAVENOUS | Status: DC | PRN

## 2023-04-10 MED ORDER — FLUTICASONE PROPIONATE 50 MCG/ACT NA SUSP
2.0000 | Freq: Every day | NASAL | Status: DC
  Administered 2023-04-10 – 2023-04-22 (×13): 2 via NASAL
  Filled 2023-04-10: qty 16

## 2023-04-10 MED ORDER — SALINE SPRAY 0.65 % NA SOLN
1.0000 | NASAL | Status: DC | PRN
  Administered 2023-04-11 – 2023-04-20 (×3): 1 via NASAL
  Filled 2023-04-10: qty 44

## 2023-04-10 NOTE — Plan of Care (Signed)

## 2023-04-10 NOTE — Progress Notes (Signed)
PROGRESS NOTE                                                                                                                                                                                                             Patient Demographics:    Erika Gates, is a 88 y.o. female, DOB - [birth year], ZOX:[id number]  Outpatient Primary MD for the patient is [name], MD    LOS - 1  Admit date - 04/09/2023    Chief Complaint  Patient presents with   Bradycardia   Fatigue       Brief Narrative (HPI from H&P)   88 y.o. female with medical history significant of hypertension, hyperlipidemia, paroxysmal atrial fibrillation, CAD s/p CABG in 1996, diastolic CHF, neurologic myopathy with quadriparesis requiring cervical decompression 07/2022, diabetes mellitus type 2, and morbid obesity who presented from home after being noted to be more lethargic last night.  Workup in the ER suggestive of severe anemia, bradycardia and hypothermia, she also had mild hypercapnia, she was given packed RBC transfusion, her Eliquis was held, she was placed on PPI and BiPAP, GI was consulted and she was admitted to the hospital for further care   Subjective:    Erika Gates today has, No headache, No chest pain, No abdominal pain - No Nausea, No new weakness tingling or numbness, no SOB.   Assessment  & Plan :      Acute respiratory failure with hypoxia and hypercapnia secondary to acute on chronic diastolic congestive heart failure precipitated by severe anemia, hypercapnia worsened by poor respiratory effort due to hypoperfusion caused by severe anemia and poor respiratory effort.  Diuresed with Lasix, overnight BiPAP, mentation much improved, breathing much improved, transition to oxygen, up in chair in daytime if possible, I-S flutter valve for pulmonary toiletry, as needed Lasix, has been transfused 2 units of packed RBC with good improvement,  continue to monitor.   Acute blood loss anemia secondary to GI bleed  Stool Hemoccult positive, was on Eliquis, Eliquis on hold, IV PPI, s/p 2 units packed RBC, posttransfusion H&H stable, no frank melena or bright red blood per rectum, [facility] on board.  Continue to hold Eliquis and monitor H&H.   Sinus bradycardia - With EMS heart rates were reported to be in the 30s, was on high-dose beta-blocker which has been held.  Heart rate improving continue to monitor.  TSH borderline suggestive of sick thyroid.  Transient hypotension  Due to combination of severe anemia and bradycardia, much improved after supportive care  UTI.  No sepsis.  Empiric Rocephin follow cultures.   Pancytopenia  Acute on chronic.  Monitor.  Packed RBCs transfused.   Paroxysmal atrial fibrillation on chronic anticoagulation  Not on rate controlling agents currently, was on Eliquis, hold due to her anemia and monitor.   Acute metabolic encephalopathy  Initial lethargy due to hypotension and hypoperfusion, due to poor respiratory effort developed some hypercapnia, CT head unremarkable, overnight BiPAP on 04/09/2023, mentation much improved, monitor with supportive care.   AKI  To prerenal azotemia, transfuse, monitor, may need some IV fluids as needed.   Hyperlipidemia -Continue statin   Genella Rife  -Protoniniix IV  Diabetes mellitus type 2, without long-term use of insulin ISS   Lab Results  Component Value Date   HGBA1C 6.2 (H) 08/12/2022   CBG (last 3)  No results for input(s): "GLUCAP" in the last 72 hours.          Condition - Extremely Guarded  Family Communication  : Daughter bedside 04/10/2023  Code Status : Full code  Consults  : GI  PUD Prophylaxis : PPI   Procedures  :     CT abdomen pelvis.  1. No evidence of retroperitoneal hematoma. 2. Small right pleural effusion with atelectasis or infiltrate at the lung bases. 3. Anasarca. 4. Cholelithiasis. 5. Diverticulosis without diverticulitis. 6.  Periumbilical hernia to the right of midline containing fat and ascites. 7. Aortic atherosclerosis and coronary artery calcifications.   Echocardiogram.  1. No significant change from echo done in NOv 2022.  2. Left ventricular ejection fraction, by estimation, is 70 to 75%. The left ventricle has hyperdynamic function. The left ventricle has no regional wall motion abnormalities. There is mild left ventricular hypertrophy. Left ventricular diastolic parameters are consistent with Grade II diastolic dysfunction (pseudonormalization). Elevated left atrial pressure.  3. Right ventricular systolic function is mildly reduced. The right ventricular size is mildly enlarged.  4. Left atrial size was mildly dilated.  5. The mitral valve is normal in structure. Mild mitral valve regurgitation.  6. The aortic valve is normal in structure. Aortic valve regurgitation is not visualized.  7. The inferior vena cava is dilated in size with <50% respiratory variability, suggesting right atrial pressure of 15 mmHg  CT head.  Nonacute.      Disposition Plan  :    Status is: Inpatient  DVT Prophylaxis  :  SCDs    Lab Results  Component Value Date   PLT 94 (L) 04/10/2023    Diet :  Diet Order             Diet heart healthy/carb modified Room service appropriate? Yes; Fluid consistency: Thin  Diet effective now                    Inpatient Medications  Scheduled Meds:  furosemide  40 mg Intravenous BID   midodrine  5 mg Oral BID WC   pantoprazole (PROTONIX) IV  40 mg Intravenous Q12H   rosuvastatin  20 mg Oral QPM   sodium chloride flush  3 mL Intravenous Q12H   Continuous Infusions:  cefTRIAXone (ROCEPHIN)  IV Stopped (04/09/23 1311)   PRN Meds:.acetaminophen **OR** acetaminophen, albuterol, docusate sodium, lidocaine, polyethylene glycol, sodium chloride    Objective:   Vitals:   04/09/23 2353 04/10/23 0325 04/10/23 0344  04/10/23 0800  BP:   (!) 98/44 106/75  Pulse: (!) 55 (!) 53  (!) 57 63  Resp:  14 16 20   Temp:   (!) 96.5 F (35.8 C)   TempSrc:   Axillary   SpO2: 97% 94% 93% 95%  Weight:   104.2 kg     Wt Readings from Last 3 Encounters:  04/10/23 104.2 kg  01/12/23 95.3 kg  10/27/22 95.3 kg     Intake/Output Summary (Last 24 hours) at 04/10/2023 1003 Last data filed at 04/09/2023 2100 Gross per 24 hour  Intake 443.42 ml  Output --  Net 443.42 ml     Physical Exam  Awake Alert, No new F.N deficits, Normal affect [place].AT,PERRAL Supple Neck, No JVD,   Symmetrical Chest wall movement, Good air movement bilaterally, CTAB RRR,No Gallops,Rubs or new Murmurs,  +ve B.Sounds, Abd Soft, No tenderness,   No Cyanosis, Clubbing or edema      Data Review:    Recent Labs  Lab 04/09/23 0217 04/09/23 0450 04/09/23 1210 04/09/23 1623 04/09/23 2332 04/10/23 0158  WBC 3.6*  --   --   --  6.6 5.8  HGB 4.9* 6.8* 8.5* 7.1* 8.1* 8.7*  HCT 20.3* 20.0* 25.0* 26.6* 29.1* 30.6*  PLT 106*  --   --   --  104* 94*  MCV 75.7*  --   --   --  78.4* 78.9*  MCH 18.3*  --   --   --  21.8* 22.4*  MCHC 24.1*  --   --   --  27.8* 28.4*  RDW 20.2*  --   --   --  18.8* 18.8*  LYMPHSABS 0.8  --   --   --   --   --   MONOABS 0.3  --   --   --   --   --   EOSABS 0.0  --   --   --   --   --   BASOSABS 0.0  --   --   --   --   --     Recent Labs  Lab 04/09/23 0122 04/09/23 0127 04/09/23 0128 04/09/23 0129 04/09/23 0425 04/09/23 0450 04/09/23 1210 04/10/23 0158  NA 139 140 139  --   --  140 139 138  K 4.2 4.2 4.2  --   --  4.3 4.4 4.3  CL 100  --  100  --   --   --   --  102  CO2 29  --   --   --   --   --   --  27  ANIONGAP 10  --   --   --   --   --   --  9  GLUCOSE 127*  --  122*  --   --   --   --  82  BUN 32*  --  34*  --   --   --   --  36*  CREATININE 1.13*  --  1.20*  --   --   --   --  1.25*  AST 32  --   --   --   --   --   --   --   ALT 22  --   --   --   --   --   --   --   ALKPHOS 82  --   --   --   --   --   --   --  BILITOT 0.5  --   --   --    --   --   --   --   ALBUMIN 3.2*  --   --   --   --   --   --   --   CRP  --   --   --   --   --   --   --  0.5  LATICACIDVEN  --   --   --  0.8 0.5  --   --   --   TSH  --   --   --   --   --   --   --  6.567*  BNP 1,659.1*  --   --   --   --   --   --   --   CALCIUM 10.0  --   --   --   --   --   --  9.7      Recent Labs  Lab 04/09/23 0122 04/09/23 0129 04/09/23 0425 04/10/23 0158  CRP  --   --   --  0.5  LATICACIDVEN  --  0.8 0.5  --   TSH  --   --   --  6.567*  BNP 1,659.1*  --   --   --   CALCIUM 10.0  --   --  9.7    Recent Labs  Lab 04/09/23 0122 04/09/23 0128 04/09/23 0129 04/09/23 0217 04/09/23 0425 04/09/23 2332 04/10/23 0158  WBC  --   --   --  3.6*  --  6.6 5.8  PLT  --   --   --  106*  --  104* 94*  CRP  --   --   --   --   --   --  0.5  LATICACIDVEN  --   --  0.8  --  0.5  --   --   CREATININE 1.13* 1.20*  --   --   --   --  1.25*   Recent Labs    04/10/23 0158  TSH 6.567*      Micro Results Recent Results (from the past 240 hour(s))  SARS Coronavirus 2 by RT PCR (hospital order, performed in [facility] hospital lab) *cepheid single result test* Anterior Nasal Swab     Status: None   Collection Time: 04/09/23  1:20 AM   Specimen: Anterior Nasal Swab  Result Value Ref Range Status   SARS Coronavirus 2 by RT PCR NEGATIVE NEGATIVE Final    Comment: Performed at [facility], [address], [place], [place] [address]    Radiology Reports DG Chest [place] 1 View  Result Date: 04/10/2023 CLINICAL DATA:  Shortness of breath. EXAM: PORTABLE CHEST 1 VIEW COMPARISON:  04/09/2023 FINDINGS: The cardio pericardial silhouette is enlarged. A stable asymmetric elevation right hemidiaphragm. Bibasilar atelectasis or infiltrate noted. Vascular congestion without overt pulmonary edema. Telemetry leads overlie the chest. IMPRESSION: 1. Bibasilar atelectasis or infiltrate. 2. Vascular congestion without overt pulmonary edema. Electronically Signed   By: Kennith Center M.D.   On: 04/10/2023 09:31   CT ABDOMEN PELVIS WO CONTRAST  Result Date: 04/09/2023 CLINICAL DATA:  Retroperitoneal bleed suspected. EXAM: CT ABDOMEN AND PELVIS WITHOUT CONTRAST TECHNIQUE: Multidetector CT imaging of the abdomen and pelvis was performed following the standard protocol without IV contrast. RADIATION DOSE REDUCTION: This exam was performed according to the departmental dose-optimization program which includes automated exposure control, adjustment of the mA  and/or kV according to patient size and/or use of iterative reconstruction technique. COMPARISON:  None Available. FINDINGS: Lower chest: The heart is enlarged and multi-vessel coronary artery calcifications are noted. There is a small right pleural effusion with atelectasis or infiltrate at the lung bases. Hepatobiliary: No focal liver abnormality is seen. Multiple stones are present within the gallbladder. No biliary ductal dilatation. Pancreas: Unremarkable. No pancreatic ductal dilatation or surrounding inflammatory changes. Spleen: Normal in size without focal abnormality. Adrenals/Urinary Tract: The adrenal glands are within normal limits. No renal calculus or hydronephrosis. The bladder is unremarkable. Stomach/Bowel: Stomach is within normal limits. Appendix appears normal. No evidence of bowel wall thickening, distention, or inflammatory changes. No free air or pneumatosis. Scattered diverticula are present along the colon without evidence of diverticulitis. Vascular/Lymphatic: Aortic atherosclerosis. No enlarged abdominal or pelvic lymph nodes. Reproductive: Status post hysterectomy. No adnexal masses. Other: Small amount of free fluid is noted in the pelvis. Anasarca is noted. No evidence of retroperitoneal hemorrhage is seen. There is a right lower quadrant periumbilical hernia containing ascites. Musculoskeletal: Sternotomy wires are noted. Degenerative changes are present in the thoracolumbar spine. No acute osseous  abnormality. IMPRESSION: 1. No evidence of retroperitoneal hematoma. 2. Small right pleural effusion with atelectasis or infiltrate at the lung bases. 3. Anasarca. 4. Cholelithiasis. 5. Diverticulosis without diverticulitis. 6. Periumbilical hernia to the right of midline containing fat and ascites. 7. Aortic atherosclerosis and coronary artery calcifications. Electronically Signed   By: Thornell Sartorius M.D.   On: 04/09/2023 21:45   ECHOCARDIOGRAM COMPLETE  Result Date: 04/09/2023    ECHOCARDIOGRAM REPORT   Patient Name:   DIANDREA TIMIAN Date of Exam: 04/09/2023 Medical Rec #:  [id number]        Height:       60.0 in Accession #:    [id number]       Weight:       210.0 lb Date of Birth:  [birth year]        BSA:          1.906 m Patient Age:    88 years         BP:           108/49 mmHg Patient Gender: F                HR:           64 bpm. Exam Location:  Inpatient Procedure: 2D Echo, Cardiac Doppler and Color Doppler Indications:    CHF-Acute Diastolic I50.31  History:        Patient has prior history of Echocardiogram examinations, most                 recent 07/14/2021. CHF, Arrythmias:Atrial Fibrillation and                 Bradycardia; Risk Factors:Hypertension and Dyslipidemia.  Sonographer:    Lucendia Herrlich Referring Phys: [id number] RONDELL A SMITH IMPRESSIONS  1. No significant change from echo done in NOv 2022.  2. Left ventricular ejection fraction, by estimation, is 70 to 75%. The left ventricle has hyperdynamic function. The left ventricle has no regional wall motion abnormalities. There is mild left ventricular hypertrophy. Left ventricular diastolic parameters are consistent with Grade II diastolic dysfunction (pseudonormalization). Elevated left atrial pressure.  3. Right ventricular systolic function is mildly reduced. The right ventricular size is mildly enlarged.  4. Left atrial size was mildly dilated.  5. The mitral valve is normal in structure. Mild mitral valve  regurgitation.  6. The aortic  valve is normal in structure. Aortic valve regurgitation is not visualized.  7. The inferior vena cava is dilated in size with <50% respiratory variability, suggesting right atrial pressure of 15 mmHg. FINDINGS  Left Ventricle: Left ventricular ejection fraction, by estimation, is 70 to 75%. The left ventricle has hyperdynamic function. The left ventricle has no regional wall motion abnormalities. The left ventricular internal cavity size was normal in size. There is mild left ventricular hypertrophy. Left ventricular diastolic parameters are consistent with Grade II diastolic dysfunction (pseudonormalization). Elevated left atrial pressure. Right Ventricle: The right ventricular size is mildly enlarged. Right vetricular wall thickness was not assessed. Right ventricular systolic function is mildly reduced. Left Atrium: Left atrial size was mildly dilated. Right Atrium: Right atrial size was normal in size. Pericardium: There is no evidence of pericardial effusion. Mitral Valve: The mitral valve is normal in structure. Mild mitral valve regurgitation. Tricuspid Valve: The tricuspid valve is normal in structure. Tricuspid valve regurgitation is mild. Aortic Valve: The aortic valve is normal in structure. Aortic valve regurgitation is not visualized. Aortic valve peak gradient measures 11.2 mmHg. Pulmonic Valve: The pulmonic valve was normal in structure. Pulmonic valve regurgitation is mild. Aorta: The aortic root and ascending aorta are structurally normal, with no evidence of dilitation. Venous: The inferior vena cava is dilated in size with less than 50% respiratory variability, suggesting right atrial pressure of 15 mmHg. IAS/Shunts: No atrial level shunt detected by color flow Doppler.  LEFT VENTRICLE PLAX 2D LVIDd:         4.20 cm   Diastology LVIDs:         2.70 cm   LV e' medial:    5.77 cm/s LV PW:         0.90 cm   LV E/e' medial:  22.9 LV IVS:        1.20 cm   LV e' lateral:   7.62 cm/s LVOT diam:     1.90  cm   LV E/e' lateral: 17.3 LV SV:         85 LV SV Index:   45 LVOT Area:     2.84 cm  RIGHT VENTRICLE            IVC RV S prime:     8.27 cm/s  IVC diam: 2.60 cm TAPSE (M-mode): 1.7 cm LEFT ATRIUM             Index        RIGHT ATRIUM           Index LA diam:        3.30 cm 1.73 cm/m   RA Area:     15.00 cm LA Vol (A2C):   49.3 ml 25.87 ml/m  RA Volume:   35.90 ml  18.84 ml/m LA Vol (A4C):   75.6 ml 39.67 ml/m LA Biplane Vol: 62.9 ml 33.01 ml/m  AORTIC VALVE AV Area (Vmax): 2.14 cm AV Vmax:        167.00 cm/s AV Peak Grad:   11.2 mmHg LVOT Vmax:      126.33 cm/s LVOT Vmean:     81.267 cm/s LVOT VTI:       0.299 m  AORTA Ao Root diam: 3.00 cm Ao Asc diam:  3.20 cm MITRAL VALVE                TRICUSPID VALVE MV Area (PHT): 3.77 cm     TR Peak grad:  49.6 mmHg MV Decel Time: 201 msec     TR Vmax:        352.00 cm/s MR Peak grad: 57.5 mmHg MR Vmax:      379.00 cm/s   SHUNTS MV E velocity: 132.00 cm/s  Systemic VTI:  0.30 m MV A velocity: 103.00 cm/s  Systemic Diam: 1.90 cm MV E/A ratio:  1.28 Dietrich Pates MD Electronically signed by Dietrich Pates MD Signature Date/Time: 04/09/2023/5:15:57 PM    Final    CT HEAD WO CONTRAST ( )  Result Date: 04/09/2023 CLINICAL DATA:  Mental status change, unknown cause. EXAM: CT HEAD WITHOUT CONTRAST TECHNIQUE: Contiguous axial images were obtained from the base of the skull through the vertex without intravenous contrast. RADIATION DOSE REDUCTION: This exam was performed according to the departmental dose-optimization program which includes automated exposure control, adjustment of the mA and/or kV according to patient size and/or use of iterative reconstruction technique. COMPARISON:  07/31/2022. FINDINGS: Brain: No acute intracranial hemorrhage, midline shift or mass effect. No extra-axial fluid collection. Periventricular white matter hypodensities are noted bilaterally. No hydrocephalus. A stable dural-based is noted over the frontal bone on the right, possible calcified  meningioma, and unchanged from the prior exam. Vascular: No hyperdense vessel or unexpected calcification. Skull: Normal. Negative for fracture or focal lesion. Sinuses/Orbits: No acute finding. Other: None. IMPRESSION: 1. No acute intracranial process. 2. Chronic microvascular ischemic changes. 3. Stable extra-axial calcification over the frontal lobe on the right, possible meningioma and unchanged from the previous exam. Electronically Signed   By: Thornell Sartorius M.D.   On: 04/09/2023 02:44   DG Chest Port 1 View  Result Date: 04/09/2023 CLINICAL DATA:  Shortness of breath EXAM: PORTABLE CHEST 1 VIEW COMPARISON:  08/16/2022 FINDINGS: Check shadow is enlarged but stable. Postsurgical changes are again seen. Elevation of the right hemidiaphragm is noted. Central vascular congestion is seen without significant edema. No focal infiltrate is noted. Postsurgical changes in the cervical spine are seen. IMPRESSION: Central vascular congestion without significant edema. Electronically Signed   By: Alcide Clever M.D.   On: 04/09/2023 01:24      Signature  -   Susa Raring M.D on 04/10/2023 at 10:03 AM   -  To page go to [email or web address]

## 2023-04-10 NOTE — Evaluation (Signed)
Clinical/Bedside Swallow Evaluation Patient Details  Name: Erika Gates MRN: [id number] Date of Birth: May 17, 1935  Today's Date: 04/10/2023 Time: SLP Start Time (ACUTE ONLY): 1233 SLP Stop Time (ACUTE ONLY): 1248 SLP Time Calculation (min) (ACUTE ONLY): 15 min  Past Medical History:  Past Medical History:  Diagnosis Date   Allergic rhinitis    Anemia    Anxiety    Coronary atherosclerosis of unspecified type of vessel, native or graft    Diabetes mellitus    DJD (degenerative joint disease)    Hypercholesteremia    Hypertension    Low back pain    Morbid obesity (HCC)    Venous insufficiency    Vitamin D deficiency    Past Surgical History:  Past Surgical History:  Procedure Laterality Date   ABDOMINAL HYSTERECTOMY     ANTERIOR CERVICAL DECOMP/DISCECTOMY FUSION N/A 08/14/2022   Procedure: Cerivcal Three-Four, Cerivcal Four-Five, Cerivcal Five-Six Anterior Cervical Discectomy Fusion;  Surgeon: Barnett Abu, MD;  Location: [facility] OR;  Service: Neurosurgery;  Laterality: N/A;  RM 19   CORONARY ARTERY BYPASS GRAFT     3 vessel- Dr. Laneta Simmers 8/96   HPI:  Erika Gates is an 88 yo female presenting to ED 8/16 with AMS. CXR 8/17 with bibasilar atelectasis or infiltrate and vascular congestion without overt pulmonary edema. CTH unremarkable. Found to have UTI, AKI, acute respiratory failure requiring BiPAP 8/16. Seen by SLP December 2023 s/p ACDF with dysphagia, although was upgraded to regular texture diet with thin liquids. PMH includes HTN, HLD, paroxysmal A-fib, CAD s/p CABG 1996, diastolic CHF, neurologic myopathy with quadriparesis requiring cervical decompression 2023, T2DM, morbid obesity    Assessment / Plan / Recommendation  Clinical Impression  Pt and her daughter present in room, report pt just finished lunch tray consisting of regular textures with thin liquids. She reports some odynophagia, which her daughter attrributes to wearing BiPAP yesterday and sleeping with her  mouth open. Pt receiving supplemental O2 via [place] today and does not report the sensation of shortness of breath. Oral motor exam WFL. Observed with trials of thin liquids, purees, and solids with no dysphagia noted. Following consecutive sips of thin liquids via straw, pt with one instance of delayed throat clear. Pt's daughter reports frequent coughing/throat clearing throughout the day today that does not seem to be a direct result of eating/drinking. No further signs concerning for aspiration during subsequent straw sips with cueing for controlled single sips. Recommend continuing regular diet with controlled sips of thin liquids. Given pt's overall respiratory status plan to f/u to further assess swallowing throughout the course of a more taxing meal. Will continue to follow as able. SLP Visit Diagnosis: Dysphagia, unspecified (R13.10)    Aspiration Risk  Mild aspiration risk    Diet Recommendation Regular;Thin liquid    Liquid Administration via: Cup;Straw Medication Administration: Whole meds with puree Supervision: Staff to assist with self feeding;Full supervision/cueing for compensatory strategies Compensations: Minimize environmental distractions;Slow rate;Small sips/bites Postural Changes: Seated upright at 90 degrees;Remain upright for at least 30 minutes after po intake    Other  Recommendations Oral Care Recommendations: Oral care BID    Recommendations for follow up therapy are one component of a multi-disciplinary discharge planning process, led by the attending physician.  Recommendations may be updated based on patient status, additional functional criteria and insurance authorization.  Follow up Recommendations No SLP follow up      Assistance Recommended at Discharge    Functional Status Assessment Patient has had a recent  decline in their functional status and demonstrates the ability to make significant improvements in function in a reasonable and predictable amount of  time.  Frequency and Duration min 2x/week  1 week       Prognosis Prognosis for improved oropharyngeal function: Good Barriers to Reach Goals: Cognitive deficits      Swallow Study   General HPI: Erika Gates is an 88 yo female presenting to ED 8/16 with AMS. CXR 8/17 with bibasilar atelectasis or infiltrate and vascular congestion without overt pulmonary edema. CTH unremarkable. Found to have UTI, AKI, acute respiratory failure requiring BiPAP 8/16. Seen by SLP December 2023 s/p ACDF with dysphagia, although was upgraded to regular texture diet with thin liquids. PMH includes HTN, HLD, paroxysmal A-fib, CAD s/p CABG 1996, diastolic CHF, neurologic myopathy with quadriparesis requiring cervical decompression 2023, T2DM, morbid obesity Type of Study: Bedside Swallow Evaluation Previous Swallow Assessment: see HPI Diet Prior to this Study: Regular;Thin liquids (Level 0) Temperature Spikes Noted: No Respiratory Status: Nasal cannula History of Recent Intubation: No Behavior/Cognition: Alert;Cooperative;Pleasant mood Oral Cavity Assessment: Within Functional Limits Oral Care Completed by SLP: No Oral Cavity - Dentition: Adequate natural dentition Vision: Functional for self-feeding Self-Feeding Abilities: Total assist Patient Positioning: Upright in chair Baseline Vocal Quality: Normal Volitional Cough: Strong Volitional Swallow: Able to elicit    Oral/Motor/Sensory Function Overall Oral Motor/Sensory Function: Within functional limits   Ice Chips Ice chips: Not tested   Thin Liquid Thin Liquid: Impaired Presentation: Straw Pharyngeal  Phase Impairments: Throat Clearing - Delayed    Nectar Thick Nectar Thick Liquid: Not tested   Honey Thick Honey Thick Liquid: Not tested   Puree Puree: Within functional limits Presentation: Spoon   Solid     Solid: Within functional limits      Gwynneth Aliment, M.A., CF-SLP Speech Language Pathology, Acute Rehabilitation  Services  Secure Chat preferred [phone]  04/10/2023,12:56 PM

## 2023-04-10 NOTE — Evaluation (Signed)
Occupational Therapy Evaluation Patient Details Name: Erika Gates MRN: [id number] DOB: Nov 25, 1934 Today's Date: 04/10/2023   History of Present Illness 88 y.o. female presented to ED 04/09/23 due to lethargy. +severe anemia, bradycardia and hypothermia, acute respiratory failure requiring BiPAP; +GI bleed;   PMH significant of hypertension, hyperlipidemia, paroxysmal atrial fibrillation, CAD s/p CABG in 1996, diastolic CHF, neurologic myopathy with quadriparesis requiring cervical decompression 07/2022, diabetes mellitus type 2, and morbid obesity   Clinical Impression   At baseline, pt completes UB ADLs Independent to Contact guard assist, LB ADLs with Min assist assist, and functional transfers/mobility household distances with a RW with Mod I. At baseline, pt receives assistance from daughter for all IADLs. Pt now presents with decreased activity tolerance, generalized B UE weakness, decreased Bilateral shoulder AROM, decreased B UE fine and gross motor coordination, decreased cognition, and decreased safety and independence with ADLs and functional mobility/transfers. Pt currently demonstrates ability to complete UB ADLs with Min to Mod assist, LB ADLs with Max assist to Total assist +2, and functional transfers with a RW with Min +2 to Mod +2 assist. Pt will benefit from acute skilled OT services to address deficits outlined below, decrease caregiver burden, and increase safety and independence with ADLs, functional transfers, and functional mobility. Post acute discharge, pt will benefit from continued skilled OT services in the home to maximize rehab potential.       If plan is discharge home, recommend the following: Two people to help with walking and/or transfers;A lot of help with bathing/dressing/bathroom;Assistance with cooking/housework;Assistance with feeding;Direct supervision/assist for medications management;Direct supervision/assist for financial management;Assist for  transportation;Help with stairs or ramp for entrance;Supervision due to cognitive status    Functional Status Assessment  Patient has had a recent decline in their functional status and demonstrates the ability to make significant improvements in function in a reasonable and predictable amount of time.  Equipment Recommendations  Other (comment) (Pt may benefit from a hospital bed in the home. Discussed this option with pt and daughter with both stating they want to think about it and wait on it at this point.)    Recommendations for Other Services       Precautions / Restrictions Precautions Precautions: Fall Restrictions Weight Bearing Restrictions: No      Mobility Bed Mobility Overal bed mobility: Needs Assistance Bed Mobility: Sit to Supine, Rolling Rolling: Mod assist, Used rails     Sit to supine: Mod assist, +2 for physical assistance, +2 for safety/equipment, HOB elevated   General bed mobility comments: Required assistance to manage B UE and torso during transfer.    Transfers Overall transfer level: Needs assistance Equipment used: Rolling walker (2 wheels) Transfers: Sit to/from Stand, Bed to chair/wheelchair/BSC Sit to Stand: Min assist, +2 physical assistance     Step pivot transfers: Mod assist, +2 physical assistance, +2 safety/equipment (Pt fatigued after sitting in recliner for 2.5 hours. Pt perforned bed to tecliner transfer earlier this day with Min assist +2.)            Balance Overall balance assessment: Needs assistance Sitting-balance support: No upper extremity supported, Feet supported Sitting balance-Leahy Scale: Fair     Standing balance support: Bilateral upper extremity supported, During functional activity, Reliant on assistive device for balance Standing balance-Leahy Scale: Poor                             ADL either performed or assessed with clinical judgement  ADL Overall ADL's : Needs  assistance/impaired Eating/Feeding: Minimal assistance;Sitting   Grooming: Minimal assistance;Sitting   Upper Body Bathing: Moderate assistance;Sitting   Lower Body Bathing: Total assistance;Bed level Lower Body Bathing Details (indicate cue type and reason): +2 helpful for physical assist Upper Body Dressing : Minimal assistance;Sitting   Lower Body Dressing: Maximal assistance;Sit to/from stand   Toilet Transfer: Moderate assistance;+2 for physical assistance;+2 for safety/equipment;BSC/3in1;Rolling walker (2 wheels);Requires wide/bariatric (step-pivot)   Toileting- Clothing Manipulation and Hygiene: Total assistance;Sit to/from stand         General ADL Comments: Pt with decreased activity tolerance during ADLs. Pt tolerated sitting in recliner for 2.5 hours today but presenting with fatigue affecting functional level with transfers.     Vision Baseline Vision/History: 1 Wears glasses (bifocals) Ability to See in Adequate Light: 0 Adequate (with glasses) Patient Visual Report: No change from baseline       Perception         Praxis         Pertinent Vitals/Pain Pain Assessment Pain Assessment: No/denies pain     Extremity/Trunk Assessment Upper Extremity Assessment Upper Extremity Assessment: Left hand dominant;Generalized weakness;RUE deficits/detail;LUE deficits/detail RUE Deficits / Details: Generalized weakness, decreased fine and gross motor coordinaiton. Impaired shoulder flexion at baseline. However, pt and daughter state current AROM shoulder flexion is decreased from baseline. Pt currently demonstrates AROM to approx. 90 degrees. RUE: Shoulder pain with ROM RUE Sensation: WNL RUE Coordination: decreased fine motor;decreased gross motor LUE Deficits / Details: Generalized weakness, decreased fine and gross motor coordinaiton. Impaired shoulder flexion at baseline. However, pt and daughter state current AROM shoulder flexion is decreased from baseline. Pt  currently demonstrates AROM to approx. 80 degrees. LUE: Shoulder pain with ROM LUE Sensation: WNL LUE Coordination: decreased fine motor;decreased gross motor   Lower Extremity Assessment Lower Extremity Assessment: Defer to PT evaluation   Cervical / Trunk Assessment Cervical / Trunk Assessment: Other exceptions Cervical / Trunk Exceptions: increased body habitus   Communication Communication Communication: Difficulty communicating thoughts/reduced clarity of speech;Hearing impairment (low volume of speech)   Cognition Arousal: Alert Behavior During Therapy: Flat affect Overall Cognitive Status: Impaired/Different from baseline Area of Impairment: Safety/judgement, Problem solving, Awareness                         Safety/Judgement: Decreased awareness of deficits, Decreased awareness of safety Awareness: Emergent Problem Solving: Slow processing, Difficulty sequencing, Requires verbal cues, Requires tactile cues General Comments: PT AAOx4 with increased time and pleasant throughout session. Pt currently requires occasional to min cues for safety and sequencing during funcitonal tasks. Pt with short-term memory deficits at baseline.     General Comments  VSS on 2L continuous O2 through nasal cannula throughout session. Pt's daughter present throughout session. RN present during a portion of session.    Exercises     Shoulder Instructions      Home Living Family/patient expects to be discharged to:: Private residence Living Arrangements: Children (daughter) Available Help at Discharge: Family;Available 24 hours/day Type of Home: House Home Access: Ramped entrance     Home Layout: One level     Bathroom Shower/Tub: Producer, television/film/video: Handicapped height Bathroom Accessibility: Yes How Accessible: Accessible via walker Home Equipment: Rollator (4 wheels);Cane - single point;BSC/3in1;Hand held Programmer, systems (2 wheels);Wheelchair -  manual;Tub bench;Adaptive equipment;Other (comment) (Adjustible bed (not a hospital bed)) Adaptive Equipment: Reacher;Long-handled shoe horn        Prior Functioning/Environment Prior  Level of Function : Needs assist             Mobility Comments: able to get OOB Independently, sit to stand Independently and walk throughout house with RW; uses w/c for up/down ramp when leaving home ADLs Comments: At baseline, pt completes UB ADLs Independent to CGA and LB ADLs with Min assist. Daughter assists pt with ADLs and assists pt with all IADLs.        OT Problem List: Decreased strength;Decreased activity tolerance;Impaired balance (sitting and/or standing);Decreased coordination;Decreased knowledge of use of DME or AE;Cardiopulmonary status limiting activity;Pain      OT Treatment/Interventions: Self-care/ADL training;Therapeutic exercise;DME and/or AE instruction;Therapeutic activities;Patient/family education;Balance training    OT Goals(Current goals can be found in the care plan section) Acute Rehab OT Goals Patient Stated Goal: To return home with daughter OT Goal Formulation: With patient/family Time For Goal Achievement: 04/24/23 Potential to Achieve Goals: Good ADL Goals Pt Will Perform Grooming: with set-up;sitting Pt Will Perform Lower Body Bathing: with adaptive equipment;sitting/lateral leans;sit to/from stand;with mod assist Pt Will Perform Lower Body Dressing: with adaptive equipment;sitting/lateral leans;sit to/from stand;with min assist Pt Will Transfer to Toilet: with supervision;ambulating;bedside commode (with least restrictive AD) Pt Will Perform Toileting - Clothing Manipulation and hygiene: sit to/from stand;with mod assist Pt/caregiver will Perform Home Exercise Program: Increased ROM;Increased strength;Both right and left upper extremity;With theraband;With theraputty;With Supervision;With written HEP provided (Increased activity tolerance)  OT Frequency: Min  1X/week    Co-evaluation              AM-PAC OT "6 Clicks" Daily Activity     Outcome Measure Help from another person eating meals?: A Little Help from another person taking care of personal grooming?: A Little Help from another person toileting, which includes using toliet, bedpan, or urinal?: Total Help from another person bathing (including washing, rinsing, drying)?: A Lot Help from another person to put on and taking off regular upper body clothing?: A Little Help from another person to put on and taking off regular lower body clothing?: A Lot 6 Click Score: 14   End of Session Equipment Utilized During Treatment: Gait belt;Rolling walker (2 wheels);Oxygen Nurse Communication: Mobility status;Other (comment) (Pt needs new Purewick placed)  Activity Tolerance: Patient limited by fatigue;Patient tolerated treatment well Patient left: in bed;with call bell/phone within reach;with bed alarm set;with family/visitor present;with nursing/sitter in room  OT Visit Diagnosis: Unsteadiness on feet (R26.81);Other abnormalities of gait and mobility (R26.89);Muscle weakness (generalized) (M62.81);Ataxia, unspecified (R27.0)                Time: 4098-1191 OT Time Calculation (min): 59 min Charges:  OT General Charges $OT Visit: 1 Visit OT Evaluation $OT Eval Low Complexity: 1 Low OT Treatments $Self Care/Home Management : 38-52 mins  Rhyanna Sorce "Orson Eva., OTR/L, MA Acute Rehab [phone]   Lendon Colonel 04/10/2023, 3:14 PM

## 2023-04-10 NOTE — Progress Notes (Signed)
[facility] Progress Note  Erika Gates 88 y.o. 01-22-1935  CC: Anemia   Subjective: Patient seen and examined at bedside.  Family at bedside.  He denied any overt bleeding.  Denied abdominal pain.  ROS : Afebrile, negative for vomiting.   Objective: Vital signs in last 24 hours: Vitals:   04/10/23 0344 04/10/23 0800  BP: (!) 98/44 106/75  Pulse: (!) 57 63  Resp: 16 20  Temp: (!) 96.5 F (35.8 C)   SpO2: 93% 95%    Physical Exam: Elderly patient, resting comfortably in the bed.  Not in acute distress.  Abdominal exam benign.  Lab Results: Recent Labs    04/09/23 0122 04/09/23 0127 04/09/23 0128 04/09/23 0450 04/09/23 1210 04/10/23 0158  NA 139   < > 139   < > 139 138  K 4.2   < > 4.2   < > 4.4 4.3  CL 100  --  100  --   --  102  CO2 29  --   --   --   --  27  GLUCOSE 127*  --  122*  --   --  82  BUN 32*  --  34*  --   --  36*  CREATININE 1.13*  --  1.20*  --   --  1.25*  CALCIUM 10.0  --   --   --   --  9.7   < > = values in this interval not displayed.   Recent Labs    04/09/23 0122  AST 32  ALT 22  ALKPHOS 82  BILITOT 0.5  PROT 6.0*  ALBUMIN 3.2*   Recent Labs    04/09/23 0217 04/09/23 0450 04/09/23 2332 04/10/23 0158  WBC 3.6*  --  6.6 5.8  NEUTROABS 2.5  --   --   --   HGB 4.9*   < > 8.1* 8.7*  HCT 20.3*   < > 29.1* 30.6*  MCV 75.7*  --  78.4* 78.9*  PLT 106*  --  104* 94*   < > = values in this interval not displayed.   No results for input(s): "LABPROT", "INR" in the last 72 hours.    Assessment/Plan: -Anemia in a patient with multiple comorbidities with heme positive stool.  Hemoglobin was down to 4.9.  Hemoglobin improved to 8.7 with blood transfusion. -Neurogenic myopathy with quadriparesis requiring cervical decompression summer 2023 -Paroxysmal atrial fibrillation -Eliquis on hold -Coronary artery disease with CHF -Multiple other comorbidities  Recommendations -------------------------- -No overt bleeding.   Discussed with family at bedside.  They would like to avoid invasive procedures at this time. -Recommend conservative management.  Recommend Protonix 40 mg twice a day for 4 weeks followed by Protonix 40 mg once a day for another 4 weeks -Okay to resume anticoagulation after 48 hours if hemoglobin stable. -No further inpatient GI workup planned.  GI will sign off.  Call us back if needed.   Kathi Der MD, FACP 04/10/2023, 10:16 AM  Contact #  [phone]

## 2023-04-10 NOTE — Evaluation (Signed)
Physical Therapy Evaluation Patient Details Name: Erika Gates MRN: [id number] DOB: 05/05/1935 Today's Date: 04/10/2023  History of Present Illness  88 y.o. female presented to ED 04/09/23 due to lethargy. +severe anemia, bradycardia and hypothermia, acute respiratory failure requiring BiPAP; +GI bleed;   PMH significant of hypertension, hyperlipidemia, paroxysmal atrial fibrillation, CAD s/p CABG in 1996, diastolic CHF, neurologic myopathy with quadriparesis requiring cervical decompression 07/2022, diabetes mellitus type 2, and morbid obesity  Clinical Impression   Pt admitted secondary to problem above with deficits below. PTA patient was living with family and was able to get herself out of bed, sit to stand and walk with RW household distances. They have a ramp and a wheelchair they use if pt goes into community.  Pt currently requires mod assist for bed mobility, min +2 to stand, and minguard assist to walk 4 ft to chair. Hopeful pt will be able to maintain her mobility in order to allow discharge home with family.  Anticipate patient will benefit from PT to address problems listed below.Will continue to follow acutely to maximize functional mobility independence and safety.           If plan is discharge home, recommend the following: A little help with walking and/or transfers;Assistance with cooking/housework;Assist for transportation;Help with stairs or ramp for entrance   Can travel by private vehicle        Equipment Recommendations Hospital bed (if pt remains weak)  Recommendations for Other Services       Functional Status Assessment Patient has had a recent decline in their functional status and demonstrates the ability to make significant improvements in function in a reasonable and predictable amount of time.     Precautions / Restrictions Precautions Precautions: Fall Restrictions Weight Bearing Restrictions: No      Mobility  Bed Mobility Overal bed mobility:  Needs Assistance Bed Mobility: Supine to Sit     Supine to sit: Mod assist, HOB elevated, Used rails     General bed mobility comments: pt able to use rail to elevate torso; use of bed pad to help scoot out to EOB;    Transfers Overall transfer level: Needs assistance Equipment used: Rolling walker (2 wheels) Transfers: Sit to/from Stand, Bed to chair/wheelchair/BSC Sit to Stand: Min assist, +2 physical assistance   Step pivot transfers: Min assist, +2 physical assistance       General transfer comment: pushes from surface; light min assist with daughter providing assist on rt side; small steps to chair    Ambulation/Gait Ambulation/Gait assistance: Min assist, +2 safety/equipment Gait Distance (Feet): 4 Feet Assistive device: Rolling walker (2 wheels) Gait Pattern/deviations: Step-to pattern, Decreased stride length   Gait velocity interpretation: <1.8 ft/sec, indicate of risk for recurrent falls   General Gait Details: walker at lowest height and too tall for pt; cues for upright posture  Stairs            Wheelchair Mobility     Tilt Bed    Modified Rankin (Stroke Patients Only)       Balance Overall balance assessment: Needs assistance Sitting-balance support: No upper extremity supported, Feet supported Sitting balance-Leahy Scale: Fair     Standing balance support: Bilateral upper extremity supported, During functional activity Standing balance-Leahy Scale: Poor                               Pertinent Vitals/Pain Pain Assessment Pain Assessment: No/denies pain  Home Living Family/patient expects to be discharged to:: Private residence Living Arrangements: Children Available Help at Discharge: Family;Available 24 hours/day Type of Home: House Home Access: Ramped entrance       Home Layout: One level Home Equipment: Rollator (4 wheels);Cane - single point;BSC/3in1;Hand held Programmer, systems (2 wheels);Shower  seat;Wheelchair - manual Additional Comments: Daughter states they may need hospital bed upon discharge    Prior Function Prior Level of Function : Needs assist             Mobility Comments: able to get OOB I'ly, sit to stand I'ly and walk throughout house with RW; uses w/c for up/down ramp when leaving home       Extremity/Trunk Assessment   Upper Extremity Assessment Upper Extremity Assessment: Defer to OT evaluation    Lower Extremity Assessment Lower Extremity Assessment: RLE deficits/detail;LLE deficits/detail RLE Deficits / Details: incr edema per daughter; strength grossly 3+ LLE Deficits / Details: incr edema per daughter; strength grossly 3+    Cervical / Trunk Assessment Cervical / Trunk Assessment: Other exceptions Cervical / Trunk Exceptions: overweight  Communication   Communication Communication: Difficulty communicating thoughts/reduced clarity of speech;Other (comment);Hearing impairment (low volume)  Cognition Arousal: Alert Behavior During Therapy: Flat affect                                   General Comments: oriented to self, DOB, following commands        General Comments General comments (skin integrity, edema, etc.): On 2L O2 with sats >90% throughout    Exercises General Exercises - Lower Extremity Long Arc Quad: AROM, Both, 5 reps, Seated Heel Raises: AROM, Both, 10 reps, Seated   Assessment/Plan    PT Assessment Patient needs continued PT services  PT Problem List Decreased strength;Decreased activity tolerance;Decreased balance;Decreased mobility;Decreased knowledge of use of DME;Cardiopulmonary status limiting activity;Obesity;Pain       PT Treatment Interventions DME instruction;Gait training;Functional mobility training;Therapeutic activities;Therapeutic exercise;Balance training;Patient/family education    PT Goals (Current goals can be found in the Care Plan section)  Acute Rehab PT Goals Patient Stated Goal:  agrees with moving well enough to go home PT Goal Formulation: With patient/family Time For Goal Achievement: 04/24/23 Potential to Achieve Goals: Good    Frequency Min 1X/week     Co-evaluation               AM-PAC PT "6 Clicks" Mobility  Outcome Measure Help needed turning from your back to your side while in a flat bed without using bedrails?: A Lot Help needed moving from lying on your back to sitting on the side of a flat bed without using bedrails?: A Lot Help needed moving to and from a bed to a chair (including a wheelchair)?: Total Help needed standing up from a chair using your arms (e.g., wheelchair or bedside chair)?: Total Help needed to walk in hospital room?: Total Help needed climbing 3-5 steps with a railing? : Total 6 Click Score: 8    End of Session Equipment Utilized During Treatment: Oxygen Activity Tolerance: Patient tolerated treatment well Patient left: in chair;with call bell/phone within reach;with chair alarm set;with family/visitor present Nurse Communication: Mobility status PT Visit Diagnosis: Other abnormalities of gait and mobility (R26.89);Muscle weakness (generalized) (M62.81)    Time: 1610-9604 PT Time Calculation (min) (ACUTE ONLY): 26 min   Charges:   PT Evaluation $PT Eval Low Complexity: 1 Low PT Treatments $Therapeutic  Activity: 8-22 mins PT General Charges $$ ACUTE PT VISIT: 1 Visit          Jerolyn Center, PT Acute Rehabilitation Services  Office [phone]   Zena Amos 04/10/2023, 11:36 AM

## 2023-04-10 NOTE — Progress Notes (Signed)
   04/10/23 2030  BiPAP/CPAP/SIPAP  BiPAP/CPAP/SIPAP Pt Type Adult  BiPAP/CPAP/SIPAP Resmed  Mask Type Full face mask  Mask Size Medium  Respiratory Rate 19 breaths/min  IPAP 12 cmH20  EPAP 5 cmH2O  Flow Rate 3 lpm  Patient Home Equipment No  Auto Titrate No  Nasal massage performed Yes  CPAP/SIPAP surface wiped down Yes  Safety Check Completed by RT for Home Unit Yes, no issues noted  BiPAP/CPAP /SiPAP Vitals  Pulse Rate 67  Resp 19  BP (!) 103/54  SpO2 99 %  Bilateral Breath Sounds Diminished  MEWS Score/Color  MEWS Score 1  MEWS Score Color Chilton Si

## 2023-04-11 ENCOUNTER — Inpatient Hospital Stay (HOSPITAL_COMMUNITY): Payer: Medicare PPO

## 2023-04-11 DIAGNOSIS — J9601 Acute respiratory failure with hypoxia: Secondary | ICD-10-CM | POA: Diagnosis not present

## 2023-04-11 DIAGNOSIS — J9602 Acute respiratory failure with hypercapnia: Secondary | ICD-10-CM | POA: Diagnosis not present

## 2023-04-11 LAB — BASIC METABOLIC PANEL
Anion gap: 6 (ref 5–15)
BUN: 31 mg/dL — ABNORMAL HIGH (ref 8–23)
CO2: 32 mmol/L (ref 22–32)
Calcium: 9.5 mg/dL (ref 8.9–10.3)
Chloride: 101 mmol/L (ref 98–111)
Creatinine, Ser: 1.34 mg/dL — ABNORMAL HIGH (ref 0.44–1.00)
GFR, Estimated: 39 mL/min — ABNORMAL LOW (ref >60)
Glucose, Bld: 106 mg/dL — ABNORMAL HIGH (ref 70–99)
Potassium: 4.1 mmol/L (ref 3.5–5.1)
Sodium: 139 mmol/L (ref 135–145)

## 2023-04-11 LAB — GLUCOSE, CAPILLARY
Glucose-Capillary: 95 mg/dL (ref 70–99)
Glucose-Capillary: 125 mg/dL — ABNORMAL HIGH (ref 70–99)
Glucose-Capillary: 101 mg/dL — ABNORMAL HIGH (ref 70–99)
Glucose-Capillary: 128 mg/dL — ABNORMAL HIGH (ref 70–99)

## 2023-04-11 LAB — CBC WITH DIFFERENTIAL/PLATELET
Abs Immature Granulocytes: 0.02 10*3/uL (ref 0.00–0.07)
Basophils Absolute: 0 10*3/uL (ref 0.0–0.1)
Basophils Relative: 0 %
Eosinophils Absolute: 0.1 10*3/uL (ref 0.0–0.5)
Eosinophils Relative: 1 %
HCT: 29.4 % — ABNORMAL LOW (ref 36.0–46.0)
Hemoglobin: 8.1 g/dL — ABNORMAL LOW (ref 12.0–15.0)
Immature Granulocytes: 0 %
Lymphocytes Relative: 11 %
Lymphs Abs: 0.7 10*3/uL (ref 0.7–4.0)
MCH: 22.4 pg — ABNORMAL LOW (ref 26.0–34.0)
MCHC: 27.6 g/dL — ABNORMAL LOW (ref 30.0–36.0)
MCV: 81.4 fL (ref 80.0–100.0)
Monocytes Absolute: 0.7 10*3/uL (ref 0.1–1.0)
Monocytes Relative: 11 %
Neutro Abs: 4.8 10*3/uL (ref 1.7–7.7)
Neutrophils Relative %: 77 %
Platelets: 92 10*3/uL — ABNORMAL LOW (ref 150–400)
RBC: 3.61 MIL/uL — ABNORMAL LOW (ref 3.87–5.11)
RDW: 20 % — ABNORMAL HIGH (ref 11.5–15.5)
WBC: 6.4 10*3/uL (ref 4.0–10.5)
nRBC: 0.9 % — ABNORMAL HIGH (ref 0.0–0.2)

## 2023-04-11 LAB — TYPE AND SCREEN
ABO/RH(D): O NEG
Antibody Screen: NEGATIVE
Unit division: 0
Unit division: 0
Unit division: 0

## 2023-04-11 LAB — BPAM RBC
Blood Product Expiration Date: 202408222359
Blood Product Expiration Date: 202408232359
Blood Product Expiration Date: 202408242359
ISSUE DATE / TIME: 202408160425
ISSUE DATE / TIME: 202408160849
ISSUE DATE / TIME: 202408161837
Unit Type and Rh: 9500
Unit Type and Rh: 9500
Unit Type and Rh: 9500

## 2023-04-11 LAB — TSH: TSH: 4.308 u[IU]/mL (ref 0.350–4.500)

## 2023-04-11 LAB — BRAIN NATRIURETIC PEPTIDE: B Natriuretic Peptide: 870.1 pg/mL — ABNORMAL HIGH (ref 0.0–100.0)

## 2023-04-11 LAB — MAGNESIUM: Magnesium: 2.3 mg/dL (ref 1.7–2.4)

## 2023-04-11 LAB — PROCALCITONIN: Procalcitonin: <0.1 ng/mL

## 2023-04-11 LAB — C-REACTIVE PROTEIN: CRP: 0.7 mg/dL (ref <1.0)

## 2023-04-11 MED ORDER — FUROSEMIDE 10 MG/ML IJ SOLN
40.0000 mg | Freq: Once | INTRAMUSCULAR | Status: AC
  Administered 2023-04-11: 40 mg via INTRAVENOUS
  Filled 2023-04-11: qty 4

## 2023-04-11 NOTE — TOC Initial Note (Signed)
Transition of Care [facility]) - Initial/Assessment Note    Patient Details  Name: Erika Gates MRN: [id number] Date of Birth: 1935/08/10  [facility]) CM/SW Contact:    Jimmy Picket, LCSW Phone Number: 04/11/2023, 1:54 PM  Clinical Narrative:                  CSW received SNF consult. CSW met with pt and two of her three daughters at bedside. CSW introduced self and explained role at the hospital. Pt reports that PTA pt lives at home with her daughter and grandson. Pt is mostly independent with mobility, she does use a walker and requires minimal assistance with ADL's.   CSW reviewed PT/OT recommendations for SNF. Pt and family report that do not want SNF they want inpatient rehab. Pts [name] reports that [facility] and was denied for [facility] but was accepted at [facility] IP and did well.Erika Gates is requesting an update from CIR directly. CSW informed the team. Members FL2 is competed but [place] requested it not be sent out yet. Family does have the medicare.gov list.   CSW will continue to follow.   Expected Discharge Plan: Skilled Nursing Facility Barriers to Discharge: Insurance Authorization, Continued Medical Work up   Patient Goals and CMS Choice Patient states their goals for this hospitalization and ongoing recovery are:: To go to CIR CMS Medicare.gov Compare Post Acute Care list provided to:: Patient Choice offered to / list presented to : Patient, Adult Children      Expected Discharge Plan and Services       Living arrangements for the past 2 months: Single Family Home                                      Prior Living Arrangements/Services Living arrangements for the past 2 months: Single Family Home Lives with:: Adult Children Patient language and need for interpreter reviewed:: Yes Do you feel safe going back to the place where you live?: Yes      Need for Family Participation in Patient Care: Yes (Comment) Care giver  support system in place?: Yes (comment) Current home services: DME    Activities of Daily Living Home Assistive Devices/Equipment: Dan Humphreys (specify type) ADL Screening (condition at time of admission) Patient's cognitive ability adequate to safely complete daily activities?: Yes Is the patient deaf or have difficulty hearing?: No Does the patient have difficulty seeing, even when wearing glasses/contacts?: No Does the patient have difficulty concentrating, remembering, or making decisions?: Yes Patient able to express need for assistance with ADLs?: Yes Does the patient have difficulty dressing or bathing?: Yes Independently performs ADLs?: Yes (appropriate for developmental age) Does the patient have difficulty walking or climbing stairs?: Yes Weakness of Legs: Both Weakness of Arms/Hands: Both  Permission Sought/Granted Permission sought to share information with : Family Supports Permission granted to share information with : Yes, Verbal Permission Granted              Emotional Assessment Appearance:: Appears stated age Attitude/Demeanor/Rapport: Engaged Affect (typically observed): Accepting, Appropriate Orientation: : Oriented to Self, Oriented to Place, Oriented to  Time Alcohol / Substance Use: Not Applicable Psych Involvement: No (comment)  Admission diagnosis:  Bradycardia [R00.1] Hypothermia, initial encounter [T68.XXXA] Acute respiratory failure with hypoxia and hypercapnia (HCC) [J96.01, J96.02] Acute on chronic respiratory failure with hypoxia and hypercapnia (HCC) [J96.21, J96.22] Anemia, unspecified type [  D64.9] Patient Active Problem List   Diagnosis Date Noted   Acute respiratory failure with hypoxia and hypercapnia (HCC) 04/09/2023   Diastolic congestive heart failure (HCC) 04/09/2023   Acute blood loss anemia 04/09/2023   GI bleed 04/09/2023   Sinus bradycardia 04/09/2023   Transient hypotension 04/09/2023   SIRS (systemic inflammatory response  syndrome) (HCC) 04/09/2023   Abnormal urinalysis 04/09/2023   Pancytopenia (HCC) 04/09/2023   Paroxysmal atrial fibrillation (HCC) 04/09/2023   Acute metabolic encephalopathy 04/09/2023   Renal insufficiency 04/09/2023   GERD (gastroesophageal reflux disease) 04/09/2023   Cervical stenosis of spinal canal 08/13/2022   Weakness 08/10/2022   Upper extremity weakness 08/10/2022   Weakness of left lower extremity 08/10/2022   Anxiety 08/10/2022   Acute respiratory failure with hypoxia and hypercarbia (HCC) 07/14/2021   DDD (degenerative disc disease), lumbar with facet arthropathy and Spinal stenosis 03/12/2017   Primary osteoarthritis of both knees 03/12/2017   History of coronary artery disease s/p CABG  03/12/2017   Gout 12/16/2012   MORBID OBESITY 06/12/2009   VITAMIN D DEFICIENCY 01/06/2009   VENOUS INSUFFICIENCY 01/15/2008   Hyperlipidemia with target LDL less than 70 09/15/2007   ALLERGIC RHINITIS 09/15/2007   HYPERCHOLESTEROLEMIA 08/08/2007   ANXIETY 08/08/2007   Essential hypertension 08/08/2007   Coronary atherosclerosis 08/08/2007   DEGENERATIVE JOINT DISEASE 08/08/2007   LOW BACK PAIN, CHRONIC 08/08/2007   ANEMIA, HX OF 08/08/2007   PCP:  [name], MD Pharmacy:   [id number] - [place], [place] - [address] AT [facility] [address] [place] [address] Phone: [phone] Fax: [phone]     Social Determinants of Health (SDOH) Social History: SDOH Screenings   Food Insecurity: No Food Insecurity (04/09/2023)  Housing: Low Risk  (04/09/2023)  Transportation Needs: No Transportation Needs (04/09/2023)  Utilities: Not At Risk (04/09/2023)  Financial Resource Strain: Low Risk  (08/27/2022)   Received from [facility], [facility]  Tobacco Use: Low Risk  (01/12/2023)  Health Literacy: Adequate Health Literacy (08/27/2022)   Received from [facility], [facility]   SDOH Interventions:     Readmission Risk Interventions     No data to display

## 2023-04-11 NOTE — Plan of Care (Signed)
  Problem: Education: Goal: Knowledge of General Education information will improve Description Including pain rating scale, medication(s)/side effects and non-pharmacologic comfort measures Outcome: Progressing   Problem: Health Behavior/Discharge Planning: Goal: Ability to manage health-related needs will improve Outcome: Progressing   Problem: Clinical Measurements: Goal: Ability to maintain clinical measurements within normal limits will improve Outcome: Progressing Goal: Will remain free from infection Outcome: Progressing Goal: Diagnostic test results will improve Outcome: Progressing Goal: Respiratory complications will improve Outcome: Progressing   Problem: Activity: Goal: Risk for activity intolerance will decrease Outcome: Progressing   Problem: Nutrition: Goal: Adequate nutrition will be maintained Outcome: Progressing   Problem: Coping: Goal: Level of anxiety will decrease Outcome: Progressing   Problem: Elimination: Goal: Will not experience complications related to bowel motility Outcome: Progressing   Problem: Safety: Goal: Ability to remain free from injury will improve Outcome: Progressing   Problem: Skin Integrity: Goal: Risk for impaired skin integrity will decrease Outcome: Progressing   

## 2023-04-11 NOTE — Plan of Care (Signed)

## 2023-04-11 NOTE — Plan of Care (Signed)

## 2023-04-11 NOTE — Progress Notes (Addendum)
Physical Therapy Treatment Patient Details Name: Erika Gates MRN: [id number] DOB: 08-10-1935 Today's Date: 04/11/2023   History of Present Illness 88 y.o. female presented to ED 04/09/23 due to lethargy. +severe anemia, bradycardia and hypothermia, acute respiratory failure requiring BiPAP; +GI bleed;   PMH significant of hypertension, hyperlipidemia, paroxysmal atrial fibrillation, CAD s/p CABG in 1996, diastolic CHF, neurologic myopathy with quadriparesis requiring cervical decompression 07/2022, diabetes mellitus type 2, and morbid obesity    PT Comments  Patient continues to require mod assist for supine to sit, today only required min assist of 1 for standing and transfer to chair. As walking to chair, pt with bil knees partially buckling and then pt "catches herself." Pt/daughter reported 8/17 that this happens sometimes. It was not safe to ambulate her with +1 assist (recommend close follow with chair). Spoke to pt (and called daughter, [name], with pt's permission) re: recommendation for additional rehab prior to return home. Patient is open to this option. [name] wants to discuss with her sister. She agrees pt is not currently at a level she can manage at home, but remains hopeful pt will improve enough to go straight home. They will consider.    Addendum-Notified by [facility] team that daughters are hopeful for CIR admission. CIR has done initial screen and pt is a potential candidate. Recommendations updated.    If plan is discharge home, recommend the following: Assistance with cooking/housework;Assist for transportation;Help with stairs or ramp for entrance;Two people to help with walking and/or transfers   Can travel by private vehicle        Equipment Recommendations  Hospital bed (if pt remains weak (family has been considering))    Recommendations for Other Services       Precautions / Restrictions Precautions Precautions: Fall Restrictions Weight Bearing Restrictions: No      Mobility  Bed Mobility Overal bed mobility: Needs Assistance Bed Mobility: Rolling, Supine to Sit     Supine to sit: Mod assist     General bed mobility comments: assist to move legs over EOB, pt pulled up to sitting from flat bed wihout use of rail with min assist, then again required mod assist to scoot  out to EOB    Transfers Overall transfer level: Needs assistance Equipment used: Rolling walker (2 wheels) Transfers: Sit to/from Stand, Bed to chair/wheelchair/BSC Sit to Stand: Min assist   Step pivot transfers: Min assist (Pt fatigued after sitting in recliner for 2.5 hours. Pt perforned bed to tecliner transfer earlier this day with Min assist +2.)       General transfer comment: x1 from bed; x2 from chair; pushes from surface; walked 4 ft to chair with better step length compared to 8/17, however with bil knee partial buckling    Ambulation/Gait Ambulation/Gait assistance: Min assist Gait Distance (Feet): 4 Feet Assistive device: Rolling walker (2 wheels) Gait Pattern/deviations: Step-to pattern, Decreased stride length   Gait velocity interpretation: <1.8 ft/sec, indicate of risk for recurrent falls   General Gait Details: walker at lowest height and too tall for pt; cues for upright posture; partial buckling of knees therefore longer distance not attempted (will need +2 chair follow)   Stairs             Wheelchair Mobility     Tilt Bed    Modified Rankin (Stroke Patients Only)       Balance Overall balance assessment: Needs assistance Sitting-balance support: No upper extremity supported, Feet supported Sitting balance-Leahy Scale: Fair  Standing balance support: Bilateral upper extremity supported, During functional activity, Reliant on assistive device for balance Standing balance-Leahy Scale: Poor                              Cognition Arousal: Alert Behavior During Therapy: Flat affect Overall Cognitive Status:  Impaired/Different from baseline Area of Impairment: Safety/judgement, Problem solving, Awareness                         Safety/Judgement: Decreased awareness of deficits, Decreased awareness of safety Awareness: Emergent Problem Solving: Slow processing, Requires verbal cues, Requires tactile cues          Exercises      General Comments General comments (skin integrity, edema, etc.): sats as low as 87% on 2L during activity      Pertinent Vitals/Pain      Home Living Family/patient expects to be discharged to:: Private residence Living Arrangements: Children (daughter) Available Help at Discharge: Family;Available 24 hours/day Type of Home: House Home Access: Ramped entrance       Home Layout: One level Home Equipment: Rollator (4 wheels);Cane - single point;BSC/3in1;Hand held Programmer, systems (2 wheels);Wheelchair - manual;Tub bench;Adaptive equipment;Other (comment) (Adjustible bed (not a hospital bed)) Additional Comments: Daughter states they may need hospital bed upon discharge    Prior Function            PT Goals (current goals can now be found in the care plan section) Acute Rehab PT Goals Patient Stated Goal: agrees with moving well enough to go home Time For Goal Achievement: 04/24/23 Potential to Achieve Goals: Good Progress towards PT goals: Progressing toward goals    Frequency    Min 1X/week      PT Plan      Co-evaluation              AM-PAC PT "6 Clicks" Mobility   Outcome Measure  Help needed turning from your back to your side while in a flat bed without using bedrails?: A Lot Help needed moving from lying on your back to sitting on the side of a flat bed without using bedrails?: A Lot Help needed moving to and from a bed to a chair (including a wheelchair)?: A Little Help needed standing up from a chair using your arms (e.g., wheelchair or bedside chair)?: A Little Help needed to walk in hospital room?:  Total Help needed climbing 3-5 steps with a railing? : Total 6 Click Score: 12    End of Session Equipment Utilized During Treatment: Oxygen Activity Tolerance: Patient limited by fatigue Patient left: in chair;with call bell/phone within reach;with chair alarm set Nurse Communication: Mobility status;Other (comment) (talked to dtr about ?SNF; wants to talk to sisters to decide) PT Visit Diagnosis: Other abnormalities of gait and mobility (R26.89);Muscle weakness (generalized) (M62.81)     Time: 0102-7253 PT Time Calculation (min) (ACUTE ONLY): 25 min  Charges:    $Gait Training: 8-22 mins $Therapeutic Activity: 8-22 mins PT General Charges $$ ACUTE PT VISIT: 1 Visit                      Jerolyn Center, PT Acute Rehabilitation Services  Office [phone]    Zena Amos 04/11/2023, 11:11 AM

## 2023-04-11 NOTE — [place]2 (Signed)
[place] MEDICAID FL2 LEVEL OF CARE FORM     IDENTIFICATION  Patient Name: Erika Gates Birthdate: 1935-02-18 Sex: female Admission Date (Current Location): 04/09/2023  [facility] and [place] Number:  Producer, television/film/video and Address:  [facility], [address] [place], [place] [address]      Provider Number: [id number]  Attending Physician Name and Address:  Leroy Sea, MD  Relative Name and Phone Number:  Corrisa, Reppond (Daughter)  [phone]    Current Level of Care: Hospital Recommended Level of Care: Skilled Nursing Facility Prior Approval Number:    Date Approved/Denied:   PASRR Number: [id number]  Discharge Plan: SNF    Current Diagnoses: Patient Active Problem List   Diagnosis Date Noted   Acute respiratory failure with hypoxia and hypercapnia (HCC) 04/09/2023   Diastolic congestive heart failure (HCC) 04/09/2023   Acute blood loss anemia 04/09/2023   GI bleed 04/09/2023   Sinus bradycardia 04/09/2023   Transient hypotension 04/09/2023   SIRS (systemic inflammatory response syndrome) (HCC) 04/09/2023   Abnormal urinalysis 04/09/2023   Pancytopenia (HCC) 04/09/2023   Paroxysmal atrial fibrillation (HCC) 04/09/2023   Acute metabolic encephalopathy 04/09/2023   Renal insufficiency 04/09/2023   GERD (gastroesophageal reflux disease) 04/09/2023   Cervical stenosis of spinal canal 08/13/2022   Weakness 08/10/2022   Upper extremity weakness 08/10/2022   Weakness of left lower extremity 08/10/2022   Anxiety 08/10/2022   Acute respiratory failure with hypoxia and hypercarbia (HCC) 07/14/2021   DDD (degenerative disc disease), lumbar with facet arthropathy and Spinal stenosis 03/12/2017   Primary osteoarthritis of both knees 03/12/2017   History of coronary artery disease s/p CABG  03/12/2017   Gout 12/16/2012   MORBID OBESITY 06/12/2009   VITAMIN D DEFICIENCY 01/06/2009   VENOUS INSUFFICIENCY 01/15/2008   Hyperlipidemia  with target LDL less than 70 09/15/2007   ALLERGIC RHINITIS 09/15/2007   HYPERCHOLESTEROLEMIA 08/08/2007   ANXIETY 08/08/2007   Essential hypertension 08/08/2007   Coronary atherosclerosis 08/08/2007   DEGENERATIVE JOINT DISEASE 08/08/2007   LOW BACK PAIN, CHRONIC 08/08/2007   ANEMIA, HX OF 08/08/2007    Orientation RESPIRATION BLADDER Height & Weight     Self, Time, Situation, Place  Other (Comment) (CPAP, 2L) Incontinent, External catheter Weight: 237 lb 14 oz (107.9 kg) Height:     BEHAVIORAL SYMPTOMS/MOOD NEUROLOGICAL BOWEL NUTRITION STATUS      Continent Diet (see discharge summary)  AMBULATORY STATUS COMMUNICATION OF NEEDS Skin   Limited Assist Verbally Normal                       Personal Care Assistance Level of Assistance  Bathing, Feeding, Dressing Bathing Assistance: Limited assistance Feeding assistance: Independent Dressing Assistance: Limited assistance     Functional Limitations Info  Sight, Hearing, Speech Sight Info: Adequate Hearing Info: Adequate Speech Info: Adequate    SPECIAL CARE FACTORS FREQUENCY  PT (By licensed PT), OT (By licensed OT)     PT Frequency: 5x/week OT Frequency: 5x/week            Contractures Contractures Info: Not present    Additional Factors Info  Code Status, Allergies, Insulin Sliding Scale Code Status Info: FULL Allergies Info: Cardizem (Diltiazem)  Glucophage (Metformin)   Insulin Sliding Scale Info: see discharge summary       Current Medications (04/11/2023):  This is the current hospital active medication list Current Facility-Administered Medications  Medication Dose Route Frequency Provider Last Rate Last Admin   0.9 %  sodium chloride infusion   Intravenous PRN Leroy Sea, MD 10 mL/hr at 04/10/23 1427 New Bag at 04/10/23 1427   acetaminophen (TYLENOL) tablet 650 mg  650 mg Oral Q6H PRN Clydie Braun, MD   650 mg at 04/11/23 1019   Or   acetaminophen (TYLENOL) suppository 650 mg  650 mg  Rectal Q6H PRN Madelyn Flavors A, MD       albuterol (PROVENTIL) (2.5 MG/3ML) 0.083% nebulizer solution 2.5 mg  2.5 mg Nebulization Q2H PRN Smith, Rondell A, MD       cefTRIAXone (ROCEPHIN) 2 g in sodium chloride 0.9 % 100 mL IVPB  2 g Intravenous Q24H Smith, Rondell A, MD 200 mL/hr at 04/11/23 1120 2 g at 04/11/23 1120   docusate sodium (COLACE) capsule 100 mg  100 mg Oral Daily PRN Leroy Sea, MD       fluticasone (FLONASE) 50 MCG/ACT nasal spray 2 spray  2 spray Each Nare Daily Leroy Sea, MD   2 spray at 04/11/23 0921   insulin aspart (novoLOG) injection 0-9 Units  0-9 Units Subcutaneous TID WC Leroy Sea, MD       lidocaine (LIDODERM) 5 % 1 patch  1 patch Transdermal Daily PRN Madelyn Flavors A, MD   1 patch at 04/09/23 2249   midodrine (PROAMATINE) tablet 5 mg  5 mg Oral BID WC Smith, Rondell A, MD   5 mg at 04/11/23 0919   pantoprazole (PROTONIX) EC tablet 40 mg  40 mg Oral BID Pham, Minh Q, RPH-CPP   40 mg at 04/11/23 0919   Followed by   Melene Muller ON 05/09/2023] pantoprazole (PROTONIX) EC tablet 40 mg  40 mg Oral Daily Pham, Minh Q, RPH-CPP       polyethylene glycol (MIRALAX / GLYCOLAX) packet 17 g  17 g Oral Daily PRN Leroy Sea, MD       rosuvastatin (CRESTOR) tablet 20 mg  20 mg Oral QPM Smith, Rondell A, MD   20 mg at 04/10/23 1707   sodium chloride (OCEAN) 0.65 % nasal spray 1 spray  1 spray Each Nare PRN Leroy Sea, MD   1 spray at 04/11/23 0920   sodium chloride flush (NS) 0.9 % injection 3 mL  3 mL Intravenous Q12H Madelyn Flavors A, MD   3 mL at 04/11/23 1610     Discharge Medications: Please see discharge summary for a list of discharge medications.  Relevant Imaging Results:  Relevant Lab Results:   Additional Information SSN: [id number]  Bodin Gorka A [place], [place]

## 2023-04-11 NOTE — Progress Notes (Addendum)
PROGRESS NOTE                                                                                                                                                                                                             Patient Demographics:    Erika Gates, is a 88 y.o. female, DOB - April 23, 1935, XBJ:[id number]  Outpatient Primary MD for the patient is [name], MD    LOS - 2  Admit date - 04/09/2023    Chief Complaint  Patient presents with   Bradycardia   Fatigue       Brief Narrative (HPI from H&P)   88 y.o. female with medical history significant of hypertension, hyperlipidemia, paroxysmal atrial fibrillation, CAD s/p CABG in 1996, diastolic CHF, neurologic myopathy with quadriparesis requiring cervical decompression 07/2022, diabetes mellitus type 2, and morbid obesity who presented from home after being noted to be more lethargic last night.  Workup in the ER suggestive of severe anemia, bradycardia and hypothermia, she also had mild hypercapnia, she was given packed RBC transfusion, her Eliquis was held, she was placed on PPI and BiPAP, GI was consulted and she was admitted to the hospital for further care   Subjective:   Patient in bed, appears comfortable, denies any headache, no fever, no chest pain or pressure, no shortness of breath , no abdominal pain. No focal weakness.   Assessment  & Plan :     Acute respiratory failure with hypoxia and hypercapnia secondary to acute on chronic diastolic congestive heart failure precipitated by severe anemia, hypercapnia worsened by poor respiratory effort due to hypoperfusion caused by severe anemia and poor respiratory effort.  Diuresed with Lasix, overnight BiPAP, mentation much improved, breathing much improved, transition to oxygen, up in chair in daytime if possible, I-S flutter valve for pulmonary toiletry, as needed Lasix, has been transfused 2 units of packed RBC  with good improvement, continue to monitor.   Acute blood loss anemia secondary to GI bleed  Stool Hemoccult positive, was on Eliquis, Eliquis on hold, IV PPI, s/p 2 units packed RBC, posttransfusion H&H stable, no frank melena or bright red blood per rectum by [facility] - family refused invasive procedures and no EGD, H&H stable post PRBCs, Case discussed with [facility] physician Dr. Levora Angel, outpatient follow-up with [facility] postdischarge, resume Eliquis on 04/12/2023 if stable.  Sinus bradycardia - With EMS heart rates were reported to be in the 30s, was on high-dose beta-blocker which has been held.  Heart rate improving continue to monitor.  TSH borderline suggestive of sick thyroid.  Transient hypotension  Due to combination of severe anemia and bradycardia, much improved after supportive care  UTI.  No sepsis.  Empiric Rocephin follow cultures.   Pancytopenia  Acute on chronic.  Monitor.  Packed RBCs transfused.   Paroxysmal atrial fibrillation on chronic anticoagulation  Not on rate controlling agents currently, was on Eliquis, hold due to her anemia and monitor.   Acute metabolic encephalopathy  Initial lethargy due to hypotension and hypoperfusion, due to poor respiratory effort developed some hypercapnia, CT head unremarkable, overnight BiPAP on 04/09/2023, mentation much improved, monitor with supportive care.   AKI.  Hydrated, home Lasix held, now developing some edema and crackles, gentle Lasix x 1 on 04/11/2023 and monitor.   Hyperlipidemia -Continue statin   Genella Rife  -Protoniniix IV  Diabetes mellitus type 2, without long-term use of insulin ISS   Lab Results  Component Value Date   HGBA1C 6.1 (H) 04/10/2023   CBG (last 3)  Recent Labs    04/10/23 1244 04/10/23 2336 04/11/23 0903  GLUCAP 109* 114* 95            Condition - Extremely Guarded  Family Communication  : Daughter bedside 04/10/2023  Code Status : Full code  Consults  : GI  PUD Prophylaxis :  PPI   Procedures  :     CT abdomen pelvis.  1. No evidence of retroperitoneal hematoma. 2. Small right pleural effusion with atelectasis or infiltrate at the lung bases. 3. Anasarca. 4. Cholelithiasis. 5. Diverticulosis without diverticulitis. 6. Periumbilical hernia to the right of midline containing fat and ascites. 7. Aortic atherosclerosis and coronary artery calcifications.   Echocardiogram.  1. No significant change from echo done in NOv 2022.  2. Left ventricular ejection fraction, by estimation, is 70 to 75%. The left ventricle has hyperdynamic function. The left ventricle has no regional wall motion abnormalities. There is mild left ventricular hypertrophy. Left ventricular diastolic parameters are consistent with Grade II diastolic dysfunction (pseudonormalization). Elevated left atrial pressure.  3. Right ventricular systolic function is mildly reduced. The right ventricular size is mildly enlarged.  4. Left atrial size was mildly dilated.  5. The mitral valve is normal in structure. Mild mitral valve regurgitation.  6. The aortic valve is normal in structure. Aortic valve regurgitation is not visualized.  7. The inferior vena cava is dilated in size with <50% respiratory variability, suggesting right atrial pressure of 15 mmHg  CT head.  Nonacute.      Disposition Plan  :    Status is: Inpatient  DVT Prophylaxis  :  SCDs    Lab Results  Component Value Date   PLT 92 (L) 04/11/2023    Diet :  Diet Order             Diet heart healthy/carb modified Room service appropriate? Yes; Fluid consistency: Thin  Diet effective now                    Inpatient Medications  Scheduled Meds:  fluticasone  2 spray Each Nare Daily   furosemide  40 mg Intravenous Once   insulin aspart  0-9 Units Subcutaneous TID WC   midodrine  5 mg Oral BID WC   pantoprazole  40 mg Oral BID   Followed by   [  START ON 05/09/2023] pantoprazole  40 mg Oral Daily   rosuvastatin  20 mg Oral QPM    sodium chloride flush  3 mL Intravenous Q12H   Continuous Infusions:  sodium chloride 10 mL/hr at 04/10/23 1427   cefTRIAXone (ROCEPHIN)  IV 2 g (04/10/23 1430)   PRN Meds:.sodium chloride, acetaminophen **OR** acetaminophen, albuterol, docusate sodium, lidocaine, polyethylene glycol, sodium chloride    Objective:   Vitals:   04/10/23 2045 04/10/23 2332 04/11/23 0354 04/11/23 0520  BP: (!) 107/43 (!) 114/50 (!) 117/44   Pulse: 65 71 69 73  Resp: (!) 21 18 19 19   Temp: (!) 96.9 F (36.1 C) (!) 96.7 F (35.9 C) (!) 96.8 F (36 C)   TempSrc: Axillary Axillary Axillary   SpO2: 100% 97% 99% 99%  Weight:    107.9 kg    Wt Readings from Last 3 Encounters:  04/11/23 107.9 kg  01/12/23 95.3 kg  10/27/22 95.3 kg     Intake/Output Summary (Last 24 hours) at 04/11/2023 0917 Last data filed at 04/11/2023 0606 Gross per 24 hour  Intake --  Output 1600 ml  Net -1600 ml     Physical Exam  Awake Alert, No new F.N deficits, Normal affect [place].AT,PERRAL Supple Neck, No JVD,   Symmetrical Chest wall movement, Good air movement bilaterally, bibasilar crackles RRR,No Gallops,Rubs or new Murmurs,  +ve B.Sounds, Abd Soft, No tenderness,   No Cyanosis, trace edema     Data Review:    Recent Labs  Lab 04/09/23 0217 04/09/23 0450 04/09/23 1623 04/09/23 2332 04/10/23 0158 04/10/23 1153 04/11/23 0209  WBC 3.6*  --   --  6.6 5.8 6.1 6.4  HGB 4.9*   < > 7.1* 8.1* 8.7* 8.3* 8.1*  HCT 20.3*   < > 26.6* 29.1* 30.6* 30.0* 29.4*  PLT 106*  --   --  104* 94* 97* 92*  MCV 75.7*  --   --  78.4* 78.9* 80.2 81.4  MCH 18.3*  --   --  21.8* 22.4* 22.2* 22.4*  MCHC 24.1*  --   --  27.8* 28.4* 27.7* 27.6*  RDW 20.2*  --   --  18.8* 18.8* 19.2* 20.0*  LYMPHSABS 0.8  --   --   --   --   --  0.7  MONOABS 0.3  --   --   --   --   --  0.7  EOSABS 0.0  --   --   --   --   --  0.1  BASOSABS 0.0  --   --   --   --   --  0.0   < > = values in this interval not displayed.    Recent Labs  Lab  04/09/23 0122 04/09/23 0127 04/09/23 0128 04/09/23 0129 04/09/23 0425 04/09/23 0450 04/09/23 1210 04/10/23 0158 04/10/23 1153 04/11/23 0209  NA 139   < > 139  --   --  140 139 138  --  139  K 4.2   < > 4.2  --   --  4.3 4.4 4.3  --  4.1  CL 100  --  100  --   --   --   --  102  --  101  CO2 29  --   --   --   --   --   --  27  --  32  ANIONGAP 10  --   --   --   --   --   --  9  --  6  GLUCOSE 127*  --  122*  --   --   --   --  82  --  106*  BUN 32*  --  34*  --   --   --   --  36*  --  31*  CREATININE 1.13*  --  1.20*  --   --   --   --  1.25*  --  1.34*  AST 32  --   --   --   --   --   --   --   --   --   ALT 22  --   --   --   --   --   --   --   --   --   ALKPHOS 82  --   --   --   --   --   --   --   --   --   BILITOT 0.5  --   --   --   --   --   --   --   --   --   ALBUMIN 3.2*  --   --   --   --   --   --   --   --   --   CRP  --   --   --   --   --   --   --  0.5  --  0.7  PROCALCITON  --   --   --   --   --   --   --   --  <0.10 <0.10  LATICACIDVEN  --   --   --  0.8 0.5  --   --   --   --   --   TSH  --   --   --   --   --   --   --  6.567*  --  4.308  HGBA1C  --   --   --   --   --   --   --   --  6.1*  --   BNP 1,659.1*  --   --   --   --   --   --   --  715.5* 870.1*  MG  --   --   --   --   --   --   --   --   --  2.3  CALCIUM 10.0  --   --   --   --   --   --  9.7  --  9.5   < > = values in this interval not displayed.      Recent Labs  Lab 04/09/23 0122 04/09/23 0129 04/09/23 0425 04/10/23 0158 04/10/23 1153 04/11/23 0209  CRP  --   --   --  0.5  --  0.7  PROCALCITON  --   --   --   --  <0.10 <0.10  LATICACIDVEN  --  0.8 0.5  --   --   --   TSH  --   --   --  6.567*  --  4.308  HGBA1C  --   --   --   --  6.1*  --   BNP 1,659.1*  --   --   --  715.5* 870.1*  MG  --   --   --   --   --  2.3  CALCIUM 10.0  --   --  9.7  --  9.5    Recent Labs  Lab 04/09/23 0122 04/09/23 0128 04/09/23 0129 04/09/23 0217 04/09/23 0425 04/09/23 2332  04/10/23 0158 04/10/23 1153 04/11/23 0209  WBC  --   --   --  3.6*  --  6.6 5.8 6.1 6.4  PLT  --   --   --  106*  --  104* 94* 97* 92*  CRP  --   --   --   --   --   --  0.5  --  0.7  PROCALCITON  --   --   --   --   --   --   --  <0.10 <0.10  LATICACIDVEN  --   --  0.8  --  0.5  --   --   --   --   CREATININE 1.13* 1.20*  --   --   --   --  1.25*  --  1.34*   Recent Labs    04/11/23 0209  TSH 4.308      Micro Results Recent Results (from the past 240 hour(s))  SARS Coronavirus 2 by RT PCR (hospital order, performed in [facility] hospital lab) *cepheid single result test* Anterior Nasal Swab     Status: None   Collection Time: 04/09/23  1:20 AM   Specimen: Anterior Nasal Swab  Result Value Ref Range Status   SARS Coronavirus 2 by RT PCR NEGATIVE NEGATIVE Final    Comment: Performed at [facility], [address] [place], [place] [address]    Radiology Reports DG Chest [place] 1 View  Result Date: 04/11/2023 CLINICAL DATA:  Shortness of breath. EXAM: PORTABLE CHEST 1 VIEW COMPARISON:  04/10/2023 FINDINGS: Sternotomy wires unchanged. Lungs are somewhat hypoinflated with stable elevation of the right hemidiaphragm. Minimal linear scarring over the right base. Minimal prominence of the central pulmonary vessels improved compared to the previous exam and likely minimal residual vascular congestion. Mild stable cardiomegaly. No effusion or pneumothorax. Remainder of the exam is unchanged. IMPRESSION: 1. Interval improvement with minimal residual vascular congestion. 2. Minimal linear scarring over the right base. Electronically Signed   By: Elberta Fortis M.D.   On: 04/11/2023 08:17   DG Chest Port 1 View  Result Date: 04/10/2023 CLINICAL DATA:  Shortness of breath. EXAM: PORTABLE CHEST 1 VIEW COMPARISON:  04/09/2023 FINDINGS: The cardio pericardial silhouette is enlarged. A stable asymmetric elevation right hemidiaphragm. Bibasilar atelectasis or infiltrate noted. Vascular  congestion without overt pulmonary edema. Telemetry leads overlie the chest. IMPRESSION: 1. Bibasilar atelectasis or infiltrate. 2. Vascular congestion without overt pulmonary edema. Electronically Signed   By: Kennith Center M.D.   On: 04/10/2023 09:31   CT ABDOMEN PELVIS WO CONTRAST  Result Date: 04/09/2023 CLINICAL DATA:  Retroperitoneal bleed suspected. EXAM: CT ABDOMEN AND PELVIS WITHOUT CONTRAST TECHNIQUE: Multidetector CT imaging of the abdomen and pelvis was performed following the standard protocol without IV contrast. RADIATION DOSE REDUCTION: This exam was performed according to the departmental dose-optimization program which includes automated exposure control, adjustment of the mA and/or kV according to patient size and/or use of iterative reconstruction technique. COMPARISON:  None Available. FINDINGS: Lower chest: The heart is enlarged and multi-vessel coronary artery calcifications are noted. There is a small right pleural effusion with atelectasis or infiltrate at the lung bases. Hepatobiliary: No focal liver abnormality is seen. Multiple stones are present within the gallbladder. No biliary ductal dilatation. Pancreas: Unremarkable. No pancreatic ductal dilatation or surrounding inflammatory changes. Spleen:  Normal in size without focal abnormality. Adrenals/Urinary Tract: The adrenal glands are within normal limits. No renal calculus or hydronephrosis. The bladder is unremarkable. Stomach/Bowel: Stomach is within normal limits. Appendix appears normal. No evidence of bowel wall thickening, distention, or inflammatory changes. No free air or pneumatosis. Scattered diverticula are present along the colon without evidence of diverticulitis. Vascular/Lymphatic: Aortic atherosclerosis. No enlarged abdominal or pelvic lymph nodes. Reproductive: Status post hysterectomy. No adnexal masses. Other: Small amount of free fluid is noted in the pelvis. Anasarca is noted. No evidence of retroperitoneal  hemorrhage is seen. There is a right lower quadrant periumbilical hernia containing ascites. Musculoskeletal: Sternotomy wires are noted. Degenerative changes are present in the thoracolumbar spine. No acute osseous abnormality. IMPRESSION: 1. No evidence of retroperitoneal hematoma. 2. Small right pleural effusion with atelectasis or infiltrate at the lung bases. 3. Anasarca. 4. Cholelithiasis. 5. Diverticulosis without diverticulitis. 6. Periumbilical hernia to the right of midline containing fat and ascites. 7. Aortic atherosclerosis and coronary artery calcifications. Electronically Signed   By: Thornell Sartorius M.D.   On: 04/09/2023 21:45   ECHOCARDIOGRAM COMPLETE  Result Date: 04/09/2023    ECHOCARDIOGRAM REPORT   Patient Name:   Erika Gates Date of Exam: 04/09/2023 Medical Rec #:  [id number]        Height:       60.0 in Accession #:    [id number]       Weight:       210.0 lb Date of Birth:  06/04/1935        BSA:          1.906 m Patient Age:    88 years         BP:           108/49 mmHg Patient Gender: F                HR:           64 bpm. Exam Location:  Inpatient Procedure: 2D Echo, Cardiac Doppler and Color Doppler Indications:    CHF-Acute Diastolic I50.31  History:        Patient has prior history of Echocardiogram examinations, most                 recent 07/14/2021. CHF, Arrythmias:Atrial Fibrillation and                 Bradycardia; Risk Factors:Hypertension and Dyslipidemia.  Sonographer:    Lucendia Herrlich Referring Phys: [id number] RONDELL A SMITH IMPRESSIONS  1. No significant change from echo done in NOv 2022.  2. Left ventricular ejection fraction, by estimation, is 70 to 75%. The left ventricle has hyperdynamic function. The left ventricle has no regional wall motion abnormalities. There is mild left ventricular hypertrophy. Left ventricular diastolic parameters are consistent with Grade II diastolic dysfunction (pseudonormalization). Elevated left atrial pressure.  3. Right ventricular  systolic function is mildly reduced. The right ventricular size is mildly enlarged.  4. Left atrial size was mildly dilated.  5. The mitral valve is normal in structure. Mild mitral valve regurgitation.  6. The aortic valve is normal in structure. Aortic valve regurgitation is not visualized.  7. The inferior vena cava is dilated in size with <50% respiratory variability, suggesting right atrial pressure of 15 mmHg. FINDINGS  Left Ventricle: Left ventricular ejection fraction, by estimation, is 70 to 75%. The left ventricle has hyperdynamic function. The left ventricle has no regional wall motion abnormalities. The left ventricular internal cavity size was  normal in size. There is mild left ventricular hypertrophy. Left ventricular diastolic parameters are consistent with Grade II diastolic dysfunction (pseudonormalization). Elevated left atrial pressure. Right Ventricle: The right ventricular size is mildly enlarged. Right vetricular wall thickness was not assessed. Right ventricular systolic function is mildly reduced. Left Atrium: Left atrial size was mildly dilated. Right Atrium: Right atrial size was normal in size. Pericardium: There is no evidence of pericardial effusion. Mitral Valve: The mitral valve is normal in structure. Mild mitral valve regurgitation. Tricuspid Valve: The tricuspid valve is normal in structure. Tricuspid valve regurgitation is mild. Aortic Valve: The aortic valve is normal in structure. Aortic valve regurgitation is not visualized. Aortic valve peak gradient measures 11.2 mmHg. Pulmonic Valve: The pulmonic valve was normal in structure. Pulmonic valve regurgitation is mild. Aorta: The aortic root and ascending aorta are structurally normal, with no evidence of dilitation. Venous: The inferior vena cava is dilated in size with less than 50% respiratory variability, suggesting right atrial pressure of 15 mmHg. IAS/Shunts: No atrial level shunt detected by color flow Doppler.  LEFT  VENTRICLE PLAX 2D LVIDd:         4.20 cm   Diastology LVIDs:         2.70 cm   LV e' medial:    5.77 cm/s LV PW:         0.90 cm   LV E/e' medial:  22.9 LV IVS:        1.20 cm   LV e' lateral:   7.62 cm/s LVOT diam:     1.90 cm   LV E/e' lateral: 17.3 LV SV:         85 LV SV Index:   45 LVOT Area:     2.84 cm  RIGHT VENTRICLE            IVC RV S prime:     8.27 cm/s  IVC diam: 2.60 cm TAPSE (M-mode): 1.7 cm LEFT ATRIUM             Index        RIGHT ATRIUM           Index LA diam:        3.30 cm 1.73 cm/m   RA Area:     15.00 cm LA Vol (A2C):   49.3 ml 25.87 ml/m  RA Volume:   35.90 ml  18.84 ml/m LA Vol (A4C):   75.6 ml 39.67 ml/m LA Biplane Vol: 62.9 ml 33.01 ml/m  AORTIC VALVE AV Area (Vmax): 2.14 cm AV Vmax:        167.00 cm/s AV Peak Grad:   11.2 mmHg LVOT Vmax:      126.33 cm/s LVOT Vmean:     81.267 cm/s LVOT VTI:       0.299 m  AORTA Ao Root diam: 3.00 cm Ao Asc diam:  3.20 cm MITRAL VALVE                TRICUSPID VALVE MV Area (PHT): 3.77 cm     TR Peak grad:   49.6 mmHg MV Decel Time: 201 msec     TR Vmax:        352.00 cm/s MR Peak grad: 57.5 mmHg MR Vmax:      379.00 cm/s   SHUNTS MV E velocity: 132.00 cm/s  Systemic VTI:  0.30 m MV A velocity: 103.00 cm/s  Systemic Diam: 1.90 cm MV E/A ratio:  1.28 Dietrich Pates MD Electronically signed by Dietrich Pates  MD Signature Date/Time: 04/09/2023/5:15:57 PM    Final    CT HEAD WO CONTRAST ( )  Result Date: 04/09/2023 CLINICAL DATA:  Mental status change, unknown cause. EXAM: CT HEAD WITHOUT CONTRAST TECHNIQUE: Contiguous axial images were obtained from the base of the skull through the vertex without intravenous contrast. RADIATION DOSE REDUCTION: This exam was performed according to the departmental dose-optimization program which includes automated exposure control, adjustment of the mA and/or kV according to patient size and/or use of iterative reconstruction technique. COMPARISON:  07/31/2022. FINDINGS: Brain: No acute intracranial hemorrhage,  midline shift or mass effect. No extra-axial fluid collection. Periventricular white matter hypodensities are noted bilaterally. No hydrocephalus. A stable dural-based is noted over the frontal bone on the right, possible calcified meningioma, and unchanged from the prior exam. Vascular: No hyperdense vessel or unexpected calcification. Skull: Normal. Negative for fracture or focal lesion. Sinuses/Orbits: No acute finding. Other: None. IMPRESSION: 1. No acute intracranial process. 2. Chronic microvascular ischemic changes. 3. Stable extra-axial calcification over the frontal lobe on the right, possible meningioma and unchanged from the previous exam. Electronically Signed   By: Thornell Sartorius M.D.   On: 04/09/2023 02:44   DG Chest Port 1 View  Result Date: 04/09/2023 CLINICAL DATA:  Shortness of breath EXAM: PORTABLE CHEST 1 VIEW COMPARISON:  08/16/2022 FINDINGS: Check shadow is enlarged but stable. Postsurgical changes are again seen. Elevation of the right hemidiaphragm is noted. Central vascular congestion is seen without significant edema. No focal infiltrate is noted. Postsurgical changes in the cervical spine are seen. IMPRESSION: Central vascular congestion without significant edema. Electronically Signed   By: Alcide Clever M.D.   On: 04/09/2023 01:24      Signature  -   Susa Raring M.D on 04/11/2023 at 9:17 AM   -  To page go to [email or web address]

## 2023-04-11 NOTE — Progress Notes (Signed)
Inpatient Rehab Admissions Coordinator:   Per TOC request patient was screened for CIR candidacy by Laura Staley, MS, CCC-SLP . At this time, Pt. Appears to be a a potential candidate for CIR. I will place   order for rehab consult per protocol for full assessment. Please contact me any with questions.  Laura Staley, MS, CCC-SLP Rehab Admissions Coordinator  [phone] (celll) [phone] (office)  

## 2023-04-11 NOTE — Progress Notes (Signed)
SATURATION QUALIFICATIONS: (This note is used to comply with regulatory documentation for home oxygen)  Patient Saturations on Room Air at Rest = 85%  Patient Saturations on Room Air while Ambulating = NA--desaturates at rest on RA  Patient Saturations on 2 Liters of oxygen while Ambulating = 87%  Please briefly explain why patient needs home oxygen:  To maintain oxygen saturation during functional mobility.    Jerolyn Center, PT Acute Rehabilitation Services  Office [phone]

## 2023-04-12 DIAGNOSIS — Z515 Encounter for palliative care: Secondary | ICD-10-CM

## 2023-04-12 DIAGNOSIS — R001 Bradycardia, unspecified: Secondary | ICD-10-CM | POA: Diagnosis not present

## 2023-04-12 DIAGNOSIS — J9621 Acute and chronic respiratory failure with hypoxia: Principal | ICD-10-CM

## 2023-04-12 DIAGNOSIS — J9601 Acute respiratory failure with hypoxia: Secondary | ICD-10-CM | POA: Diagnosis not present

## 2023-04-12 DIAGNOSIS — R6510 Systemic inflammatory response syndrome (SIRS) of non-infectious origin without acute organ dysfunction: Secondary | ICD-10-CM

## 2023-04-12 DIAGNOSIS — G8250 Quadriplegia, unspecified: Secondary | ICD-10-CM | POA: Diagnosis not present

## 2023-04-12 DIAGNOSIS — R5381 Other malaise: Secondary | ICD-10-CM | POA: Diagnosis not present

## 2023-04-12 DIAGNOSIS — G9341 Metabolic encephalopathy: Secondary | ICD-10-CM | POA: Diagnosis not present

## 2023-04-12 DIAGNOSIS — J9602 Acute respiratory failure with hypercapnia: Secondary | ICD-10-CM | POA: Diagnosis not present

## 2023-04-12 DIAGNOSIS — Z7189 Other specified counseling: Secondary | ICD-10-CM | POA: Diagnosis not present

## 2023-04-12 DIAGNOSIS — J9622 Acute and chronic respiratory failure with hypercapnia: Secondary | ICD-10-CM

## 2023-04-12 LAB — CBC
HCT: 30.4 % — ABNORMAL LOW (ref 36.0–46.0)
Hemoglobin: 8.3 g/dL — ABNORMAL LOW (ref 12.0–15.0)
MCH: 22.5 pg — ABNORMAL LOW (ref 26.0–34.0)
MCHC: 27.3 g/dL — ABNORMAL LOW (ref 30.0–36.0)
MCV: 82.4 fL (ref 80.0–100.0)
Platelets: 92 10*3/uL — ABNORMAL LOW (ref 150–400)
RBC: 3.69 MIL/uL — ABNORMAL LOW (ref 3.87–5.11)
RDW: 21.3 % — ABNORMAL HIGH (ref 11.5–15.5)
WBC: 7.6 10*3/uL (ref 4.0–10.5)
nRBC: 0 % (ref 0.0–0.2)

## 2023-04-12 LAB — CBC WITH DIFFERENTIAL/PLATELET
Abs Immature Granulocytes: 0.02 10*3/uL (ref 0.00–0.07)
Basophils Absolute: 0 10*3/uL (ref 0.0–0.1)
Basophils Relative: 0 %
Eosinophils Absolute: 0.1 10*3/uL (ref 0.0–0.5)
Eosinophils Relative: 2 %
HCT: 29.6 % — ABNORMAL LOW (ref 36.0–46.0)
Hemoglobin: 7.9 g/dL — ABNORMAL LOW (ref 12.0–15.0)
Immature Granulocytes: 0 %
Lymphocytes Relative: 9 %
Lymphs Abs: 0.6 10*3/uL — ABNORMAL LOW (ref 0.7–4.0)
MCH: 21.7 pg — ABNORMAL LOW (ref 26.0–34.0)
MCHC: 26.7 g/dL — ABNORMAL LOW (ref 30.0–36.0)
MCV: 81.3 fL (ref 80.0–100.0)
Monocytes Absolute: 0.9 10*3/uL (ref 0.1–1.0)
Monocytes Relative: 13 %
Neutro Abs: 5.2 10*3/uL (ref 1.7–7.7)
Neutrophils Relative %: 76 %
Platelets: 88 10*3/uL — ABNORMAL LOW (ref 150–400)
RBC: 3.64 MIL/uL — ABNORMAL LOW (ref 3.87–5.11)
RDW: 21.2 % — ABNORMAL HIGH (ref 11.5–15.5)
WBC: 6.7 10*3/uL (ref 4.0–10.5)
nRBC: 0 % (ref 0.0–0.2)

## 2023-04-12 LAB — PROCALCITONIN: Procalcitonin: <0.1 ng/mL

## 2023-04-12 LAB — BASIC METABOLIC PANEL
Anion gap: 5 (ref 5–15)
BUN: 22 mg/dL (ref 8–23)
CO2: 35 mmol/L — ABNORMAL HIGH (ref 22–32)
Calcium: 9.7 mg/dL (ref 8.9–10.3)
Chloride: 100 mmol/L (ref 98–111)
Creatinine, Ser: 0.97 mg/dL (ref 0.44–1.00)
GFR, Estimated: 57 mL/min — ABNORMAL LOW (ref >60)
Glucose, Bld: 96 mg/dL (ref 70–99)
Potassium: 3.7 mmol/L (ref 3.5–5.1)
Sodium: 140 mmol/L (ref 135–145)

## 2023-04-12 LAB — GLUCOSE, CAPILLARY
Glucose-Capillary: 83 mg/dL (ref 70–99)
Glucose-Capillary: 110 mg/dL — ABNORMAL HIGH (ref 70–99)
Glucose-Capillary: 111 mg/dL — ABNORMAL HIGH (ref 70–99)
Glucose-Capillary: 127 mg/dL — ABNORMAL HIGH (ref 70–99)

## 2023-04-12 LAB — C-REACTIVE PROTEIN: CRP: 0.8 mg/dL (ref <1.0)

## 2023-04-12 LAB — BRAIN NATRIURETIC PEPTIDE: B Natriuretic Peptide: 895.5 pg/mL — ABNORMAL HIGH (ref 0.0–100.0)

## 2023-04-12 LAB — MAGNESIUM: Magnesium: 2.2 mg/dL (ref 1.7–2.4)

## 2023-04-12 MED ORDER — FUROSEMIDE 10 MG/ML IJ SOLN
40.0000 mg | Freq: Once | INTRAMUSCULAR | Status: AC
  Administered 2023-04-12: 40 mg via INTRAVENOUS
  Filled 2023-04-12: qty 4

## 2023-04-12 MED ORDER — NAPHAZOLINE-PHENIRAMINE 0.025-0.3 % OP SOLN
1.0000 [drp] | Freq: Four times a day (QID) | OPHTHALMIC | Status: DC | PRN
  Administered 2023-04-13 – 2023-04-16 (×4): 1 [drp] via OPHTHALMIC
  Filled 2023-04-12: qty 15

## 2023-04-12 MED ORDER — POTASSIUM CHLORIDE CRYS ER 20 MEQ PO TBCR
40.0000 meq | EXTENDED_RELEASE_TABLET | Freq: Once | ORAL | Status: AC
  Administered 2023-04-12: 40 meq via ORAL
  Filled 2023-04-12: qty 2

## 2023-04-12 NOTE — Progress Notes (Signed)
Subjective: No bleeding.  Objective: Vital signs in last 24 hours: Temp:  [97.6 F (36.4 C)-97.8 F (36.6 C)] 97.7 F (36.5 C) (08/19 0724) Pulse Rate:  [71-80] 73 (08/19 0724) Resp:  [18-22] 22 (08/19 0724) BP: (105-137)/(38-68) 128/68 (08/19 0847) SpO2:  [94 %-97 %] 95 % (08/19 0724) Weight:  [104.6 kg] 104.6 kg (08/19 0500) Weight change: -3.3 kg    PE: GEN:  Overweight, elderly and somewhat frail-appearing NEURO:  Quadriparesis ABD:  Protuberant, soft, non-tender   Lab Results: CBC    Component Value Date/Time   WBC 6.7 04/12/2023 0244   RBC 3.64 (L) 04/12/2023 0244   HGB 7.9 (L) 04/12/2023 0244   HCT 29.6 (L) 04/12/2023 0244   PLT 88 (L) 04/12/2023 0244   MCV 81.3 04/12/2023 0244   MCH 21.7 (L) 04/12/2023 0244   MCHC 26.7 (L) 04/12/2023 0244   RDW 21.2 (H) 04/12/2023 0244   LYMPHSABS 0.6 (L) 04/12/2023 0244   MONOABS 0.9 04/12/2023 0244   EOSABS 0.1 04/12/2023 0244   BASOSABS 0.0 04/12/2023 0244  CMP     Component Value Date/Time   NA 140 04/12/2023 0244   NA 146 (H) 09/24/2021 1603   K 3.7 04/12/2023 0244   CL 100 04/12/2023 0244   CO2 35 (H) 04/12/2023 0244   GLUCOSE 96 04/12/2023 0244   BUN 22 04/12/2023 0244   BUN 22 09/24/2021 1603   CREATININE 0.97 04/12/2023 0244   CALCIUM 9.7 04/12/2023 0244   PROT 6.0 (L) 04/09/2023 0122   ALBUMIN 3.2 (L) 04/09/2023 0122   AST 32 04/09/2023 0122   ALT 22 04/09/2023 0122   ALKPHOS 82 04/09/2023 0122   BILITOT 0.5 04/09/2023 0122   GFR 79.22 06/21/2013 1053   EGFR 54 (L) 09/24/2021 1603   GFRNONAA 57 (L) 04/12/2023 0244   Assessment:   Anemia. Hemoccult-positive stools. Atrial fibrillation. Quadriparesis from neurogenic myopathy. Chronic anticoagulation, apixaban.  Plan:   Reviewed options with family and patient.  We have opted for Endoscopy to further evaluate.  If this is unrevealing, will likely not pursue any further GI testing in absence of new/interval symptoms. Continue PPI. Apixaban on  hold. Endoscopy tomorrow for further evaluation. Risks (bleeding, infection, bowel perforation that could require surgery, sedation-related changes in cardiopulmonary systems), benefits (identification and possible treatment of source of symptoms, exclusion of certain causes of symptoms), and alternatives (watchful waiting, radiographic imaging studies, empiric medical treatment) of upper endoscopy (EGD) were explained to patient/family in detail and patient wishes to proceed.  [facility] will follow.   [name] 04/12/2023, 10:19 AM   Cell [phone] If no answer or after 5 PM call [phone]

## 2023-04-12 NOTE — Consult Note (Signed)
Physical Medicine and Rehabilitation Consult Reason for Consult:debility and confusion after being admitted for acute respiratory failure Referring Physician: Thedore Mins   HPI: Erika Gates is a 88 y.o. female with a history of paroxysmal atrial fibrillation, CAD status post CABG in 31 and cervical myelopathy with decompression and December 2023, morbid obesity who presented on 04/09/2019 for with increased lethargy.  Workup in the emergency department suggested significant anemia and hypothermia as well as bradycardia and hypercapnia.  Patient was transfused packed red blood cells and Eliquis was held.  She was placed on BiPAP for respiratory support.  Patient also was diuresed with Lasix and improvement was noted the following morning.  Stool was Hemoccult positive and family has been discussing an EGD with the medical team.  Mentation improving with use of BiPAP.  Patient has been seen by therapies.  She is requiring min assist for sit to stand transfers and she was able to walk 4 feet with a rolling walker.  Prior to admission patient was able to stand and ambulate in her household at modified independent level.  She could perform most ADLs at a contact-guard assist level.  Daughter provides assistance   Review of Systems  Constitutional:  Negative for fever.  HENT:  Negative for hearing loss.   Eyes: Negative.   Respiratory:  Positive for cough and shortness of breath.   Cardiovascular:  Negative for chest pain.  Gastrointestinal:  Negative for heartburn and nausea.  Genitourinary:  Negative for dysuria and urgency.  Musculoskeletal:  Positive for myalgias.  Skin: Negative.   Neurological:  Positive for weakness.  Psychiatric/Behavioral:  Negative for depression.    Past Medical History:  Diagnosis Date   Allergic rhinitis    Anemia    Anxiety    Coronary atherosclerosis of unspecified type of vessel, native or graft    Diabetes mellitus    DJD (degenerative joint disease)     Hypercholesteremia    Hypertension    Low back pain    Morbid obesity (HCC)    Venous insufficiency    Vitamin D deficiency    Past Surgical History:  Procedure Laterality Date   ABDOMINAL HYSTERECTOMY     ANTERIOR CERVICAL DECOMP/DISCECTOMY FUSION N/A 08/14/2022   Procedure: Cerivcal Three-Four, Cerivcal Four-Five, Cerivcal Five-Six Anterior Cervical Discectomy Fusion;  Surgeon: Barnett Abu, MD;  Location: [facility] OR;  Service: Neurosurgery;  Laterality: N/A;  RM 19   CORONARY ARTERY BYPASS GRAFT     3 vessel- Dr. Laneta Simmers 8/96   Family History  Problem Relation Age of Onset   Heart disease Mother    Heart disease Brother    Social History:  reports that she has never smoked. She has never used smokeless tobacco. She reports that she does not drink alcohol and does not use drugs. Allergies:  Allergies  Allergen Reactions   Cardizem [Diltiazem] Rash   Glucophage [Metformin] Other (See Comments)    "it made me sad"   Medications Prior to Admission  Medication Sig Dispense Refill   acetaminophen (TYLENOL) 325 MG tablet Take 650 mg by mouth 2 (two) times daily as needed for moderate pain, fever or headache.     ammonium lactate (LAC-HYDRIN) 12 % lotion Apply 1 Application topically daily as needed for dry skin.     apixaban (ELIQUIS) 5 MG TABS tablet Take 1 tablet (5 mg total) by mouth 2 (two) times daily. 180 tablet 1   Cholecalciferol (VITAMIN D-3 PO) Take 1 tablet by mouth daily.  clonazePAM (KLONOPIN) 0.5 MG disintegrating tablet Take 1 tablet (0.5 mg total) by mouth 2 (two) times daily as needed (anxiety). (Patient taking differently: Take 0.5 mg by mouth 2 (two) times daily.) 30 tablet 0   dapagliflozin propanediol (FARXIGA) 10 MG TABS tablet Take 1 tablet (10 mg total) by mouth daily before breakfast. 30 tablet 9   diclofenac sodium (VOLTAREN) 1 % GEL Apply 2 g topically daily as needed (pain).     docusate sodium (COLACE) 100 MG capsule Take 1 capsule (100 mg total) by  mouth 2 (two) times daily. (Patient taking differently: Take 100 mg by mouth daily as needed (constipation).) 10 capsule 0   furosemide (LASIX) 20 MG tablet Take 1 tablet (20 mg total) by mouth daily. 30 tablet 11   gabapentin (NEURONTIN) 100 MG capsule Take 1 capsule (100 mg total) by mouth at bedtime. (Patient taking differently: Take 100 mg by mouth daily.) 30 capsule 0   lidocaine 4 % Place 1 patch onto the skin daily as needed (pain).     lisinopril (ZESTRIL) 20 MG tablet Take 1 tablet (20 mg total) by mouth daily. 30 tablet 11   metoprolol succinate (TOPROL-XL) 100 MG 24 hr tablet Take 1 tablet (100 mg total) by mouth daily. 90 tablet 3   Multiple Vitamins-Minerals (MULTIVITAMIN WOMEN 50+) TABS Take 1 tablet by mouth daily.     pantoprazole (PROTONIX) 40 MG tablet Take 1 tablet (40 mg total) by mouth daily. 30 tablet 3   polyethylene glycol (MIRALAX / GLYCOLAX) 17 g packet Take 17 g by mouth daily as needed (constipation).     rosuvastatin (CRESTOR) 20 MG tablet Take 1 tablet (20 mg total) by mouth daily. 90 tablet 3   VITAMIN E PO Take 1 capsule by mouth daily.      Home: Home Living Family/patient expects to be discharged to:: Private residence Living Arrangements: Children (daughter) Available Help at Discharge: Family, Available 24 hours/day Type of Home: House Home Access: Ramped entrance Home Layout: One level Bathroom Shower/Tub: Health visitor: Handicapped height Bathroom Accessibility: Yes Home Equipment: Rollator (4 wheels), Cane - single point, BSC/3in1, Hand held shower head, Rolling Walker (2 wheels), Wheelchair - manual, Tub bench, Adaptive equipment, Other (comment) (Adjustible bed (not a hospital bed)) Adaptive Equipment: Reacher, Long-handled shoe horn Additional Comments: Daughter states they may need hospital bed upon discharge  Functional History: Prior Function Prior Level of Function : Needs assist Mobility Comments: able to get OOB  Independently, sit to stand Independently and walk throughout house with RW; uses w/c for up/down ramp when leaving home ADLs Comments: At baseline, pt completes UB ADLs Independent to CGA and LB ADLs with Min assist. Daughter assists pt with ADLs and assists pt with all IADLs. Functional Status:  Mobility: Bed Mobility Overal bed mobility: Needs Assistance Bed Mobility: Rolling, Supine to Sit Rolling: Mod assist, Used rails Supine to sit: Mod assist Sit to supine: Mod assist, +2 for physical assistance, +2 for safety/equipment, HOB elevated General bed mobility comments: assist to move legs over EOB, pt pulled up to sitting from flat bed wihout use of rail with min assist, then again required mod assist to scoot  out to EOB Transfers Overall transfer level: Needs assistance Equipment used: Rolling walker (2 wheels) Transfers: Sit to/from Stand, Bed to chair/wheelchair/BSC Sit to Stand: Min assist Bed to/from chair/wheelchair/BSC transfer type:: Step pivot Step pivot transfers: Min assist (Pt fatigued after sitting in recliner for 2.5 hours. Pt perforned bed to tecliner transfer  earlier this day with Min assist +2.) General transfer comment: x1 from bed; x2 from chair; pushes from surface; walked 4 ft to chair with better step length compared to 8/17, however with bil knee partial buckling Ambulation/Gait Ambulation/Gait assistance: Min assist Gait Distance (Feet): 4 Feet Assistive device: Rolling walker (2 wheels) Gait Pattern/deviations: Step-to pattern, Decreased stride length General Gait Details: walker at lowest height and too tall for pt; cues for upright posture; partial buckling of knees therefore longer distance not attempted (will need +2 chair follow) Gait velocity interpretation: <1.8 ft/sec, indicate of risk for recurrent falls    ADL: ADL Overall ADL's : Needs assistance/impaired Eating/Feeding: Minimal assistance, Sitting Grooming: Minimal assistance, Sitting Upper  Body Bathing: Moderate assistance, Sitting Lower Body Bathing: Total assistance, Bed level Lower Body Bathing Details (indicate cue type and reason): +2 helpful for physical assist Upper Body Dressing : Minimal assistance, Sitting Lower Body Dressing: Maximal assistance, Sit to/from stand Toilet Transfer: Moderate assistance, +2 for physical assistance, +2 for safety/equipment, BSC/3in1, Rolling walker (2 wheels), Requires wide/bariatric (step-pivot) Toileting- Clothing Manipulation and Hygiene: Total assistance, Sit to/from stand General ADL Comments: Pt with decreased activity tolerance during ADLs. Pt tolerated sitting in recliner for 2.5 hours today but presenting with fatigue affecting functional level with transfers.  Cognition: Cognition Overall Cognitive Status: Impaired/Different from baseline Orientation Level: Oriented to person, Oriented to place, Disoriented to time, Disoriented to situation Cognition Arousal: Alert Behavior During Therapy: Flat affect Overall Cognitive Status: Impaired/Different from baseline Area of Impairment: Safety/judgement, Problem solving, Awareness Safety/Judgement: Decreased awareness of deficits, Decreased awareness of safety Awareness: Emergent Problem Solving: Slow processing, Requires verbal cues, Requires tactile cues General Comments: PT AAOx4 with increased time and pleasant throughout session. Pt currently requires occasional to min cues for safety and sequencing during funcitonal tasks. Pt with short-term memory deficits at baseline.  Blood pressure 132/60, pulse 75, temperature (!) 97.5 F (36.4 C), temperature source Oral, resp. rate 20, weight 104.6 kg, SpO2 96%. Physical Exam Constitutional:      Appearance: She is obese.  HENT:     Head: Normocephalic.     Right Ear: External ear normal.     Left Ear: External ear normal.     Nose: Nose normal.     Mouth/Throat:     Mouth: Mucous membranes are moist.  Eyes:     Extraocular  Movements: Extraocular movements intact.     Pupils: Pupils are equal, round, and reactive to light.  Cardiovascular:     Rate and Rhythm: Normal rate.  Pulmonary:     Effort: Pulmonary effort is normal.  Abdominal:     Palpations: Abdomen is soft.  Musculoskeletal:        General: Tenderness (knees) present.     Cervical back: Normal range of motion.     Comments: Significant charcot foot deformity RLE, mild deformity LLE  Neurological:     Mental Status: She is alert.     Comments: Alert and oriented x 3. Normal insight and awareness. Intact Memory. Normal language and speech. Cranial nerve exam unremarkable. MMT: LUE limited by shoulder pain but otherwise 3- prox to 4/5 distally. RUE 4 /5 prox to distal. LE 3-HF, KE and 3-4/5 ADF/PF bilaterally. Decreased LT in right foot/ankle.    Psychiatric:        Mood and Affect: Mood normal.        Behavior: Behavior normal.     Results for orders placed or performed during the hospital encounter of 04/09/23 (from  the past 24 hour(s))  Glucose, capillary     Status: Abnormal   Collection Time: 04/11/23  1:06 PM  Result Value Ref Range   Glucose-Capillary 125 (H) 70 - 99 mg/dL  Glucose, capillary     Status: Abnormal   Collection Time: 04/11/23  4:28 PM  Result Value Ref Range   Glucose-Capillary 101 (H) 70 - 99 mg/dL  Glucose, capillary     Status: Abnormal   Collection Time: 04/11/23  9:26 PM  Result Value Ref Range   Glucose-Capillary 128 (H) 70 - 99 mg/dL  Procalcitonin     Status: None   Collection Time: 04/12/23  2:44 AM  Result Value Ref Range   Procalcitonin <0.10 ng/mL  Magnesium     Status: None   Collection Time: 04/12/23  2:44 AM  Result Value Ref Range   Magnesium 2.2 1.7 - 2.4 mg/dL  C-reactive protein     Status: None   Collection Time: 04/12/23  2:44 AM  Result Value Ref Range   CRP 0.8 <1.0 mg/dL  CBC with Differential/Platelet     Status: Abnormal   Collection Time: 04/12/23  2:44 AM  Result Value Ref Range    WBC 6.7 4.0 - 10.5 K/uL   RBC 3.64 (L) 3.87 - 5.11 MIL/uL   Hemoglobin 7.9 (L) 12.0 - 15.0 g/dL   HCT 40.9 (L) 81.1 - 91.4 %   MCV 81.3 80.0 - 100.0 fL   MCH 21.7 (L) 26.0 - 34.0 pg   MCHC 26.7 (L) 30.0 - 36.0 g/dL   RDW 78.2 (H) 95.6 - 21.3 %   Platelets 88 (L) 150 - 400 K/uL   nRBC 0.0 0.0 - 0.2 %   Neutrophils Relative % 76 %   Neutro Abs 5.2 1.7 - 7.7 K/uL   Lymphocytes Relative 9 %   Lymphs Abs 0.6 (L) 0.7 - 4.0 K/uL   Monocytes Relative 13 %   Monocytes Absolute 0.9 0.1 - 1.0 K/uL   Eosinophils Relative 2 %   Eosinophils Absolute 0.1 0.0 - 0.5 K/uL   Basophils Relative 0 %   Basophils Absolute 0.0 0.0 - 0.1 K/uL   Immature Granulocytes 0 %   Abs Immature Granulocytes 0.02 0.00 - 0.07 K/uL  Brain natriuretic peptide     Status: Abnormal   Collection Time: 04/12/23  2:44 AM  Result Value Ref Range   B Natriuretic Peptide 895.5 (H) 0.0 - 100.0 pg/mL  Basic metabolic panel     Status: Abnormal   Collection Time: 04/12/23  2:44 AM  Result Value Ref Range   Sodium 140 135 - 145 mmol/L   Potassium 3.7 3.5 - 5.1 mmol/L   Chloride 100 98 - 111 mmol/L   CO2 35 (H) 22 - 32 mmol/L   Glucose, Bld 96 70 - 99 mg/dL   BUN 22 8 - 23 mg/dL   Creatinine, Ser 0.86 0.44 - 1.00 mg/dL   Calcium 9.7 8.9 - 57.8 mg/dL   GFR, Estimated 57 (L) >60 mL/min   Anion gap 5 5 - 15  Glucose, capillary     Status: None   Collection Time: 04/12/23  7:26 AM  Result Value Ref Range   Glucose-Capillary 83 70 - 99 mg/dL  CBC     Status: Abnormal   Collection Time: 04/12/23 11:49 AM  Result Value Ref Range   WBC 7.6 4.0 - 10.5 K/uL   RBC 3.69 (L) 3.87 - 5.11 MIL/uL   Hemoglobin 8.3 (L) 12.0 - 15.0 g/dL  HCT 30.4 (L) 36.0 - 46.0 %   MCV 82.4 80.0 - 100.0 fL   MCH 22.5 (L) 26.0 - 34.0 pg   MCHC 27.3 (L) 30.0 - 36.0 g/dL   RDW 95.6 (H) 21.3 - 08.6 %   Platelets 92 (L) 150 - 400 K/uL   nRBC 0.0 0.0 - 0.2 %  Glucose, capillary     Status: Abnormal   Collection Time: 04/12/23 11:54 AM  Result Value  Ref Range   Glucose-Capillary 110 (H) 70 - 99 mg/dL  Blood transfusion report - scanned     Status: None ()   Collection Time: 04/12/23 11:57 AM   Narrative   Ordered by an unspecified provider.  Blood transfusion report - scanned     Status: None   Collection Time: 04/12/23 11:57 AM   Narrative   Ordered by an unspecified provider.   DG Chest Port 1 View  Result Date: 04/11/2023 CLINICAL DATA:  Shortness of breath. EXAM: PORTABLE CHEST 1 VIEW COMPARISON:  04/10/2023 FINDINGS: Sternotomy wires unchanged. Lungs are somewhat hypoinflated with stable elevation of the right hemidiaphragm. Minimal linear scarring over the right base. Minimal prominence of the central pulmonary vessels improved compared to the previous exam and likely minimal residual vascular congestion. Mild stable cardiomegaly. No effusion or pneumothorax. Remainder of the exam is unchanged. IMPRESSION: 1. Interval improvement with minimal residual vascular congestion. 2. Minimal linear scarring over the right base. Electronically Signed   By: Elberta Fortis M.D.   On: 04/11/2023 08:17    Assessment/Plan: Diagnosis: 88 yo female with hx of chronic tetraplegia related to cervical myelopathy who presented with respiratory failure and hypercapnia due to acute on chronic CHF. Pt's mental status has been clearing Does the need for close, 24 hr/day medical supervision in concert with the patient's rehab needs make it unreasonable for this patient to be served in a less intensive setting? Yes Co-Morbidities requiring supervision/potential complications:  -Bilateral charcot foot deformities (R>L) -Acute blood loss anemia d/t GI bleed -Sinus bradycardia -UTI -Paroxysmal Atrial Fib -DM II Due to bladder management, bowel management, safety, skin/wound care, disease management, medication administration, pain management, and patient education, does the patient require 24 hr/day rehab nursing? Yes Does the patient require coordinated care  of a physician, rehab nurse, therapy disciplines of PT, OT to address physical and functional deficits in the context of the above medical diagnosis(es)? Yes Addressing deficits in the following areas: balance, endurance, locomotion, strength, transferring, bowel/bladder control, bathing, dressing, feeding, grooming, toileting, and psychosocial support Can the patient actively participate in an intensive therapy program of at least 3 hrs of therapy per day at least 5 days per week? Yes The potential for patient to make measurable gains while on inpatient rehab is excellent Anticipated functional outcomes upon discharge from inpatient rehab are supervision  with PT, supervision and min assist with OT, n/a with SLP. Estimated rehab length of stay to reach the above functional goals is: 10-14 days Anticipated discharge destination: Home Overall Rehab/Functional Prognosis: excellent  POST ACUTE RECOMMENDATIONS: This patient's condition is appropriate for continued rehabilitative care in the following setting: CIR Patient has agreed to participate in recommended program. Yes Note that insurance prior authorization may be required for reimbursement for recommended care.  Comment: Rehab Admissions Coordinator to follow up. Spoke with daughter who can provide needed assistance at home.    I have personally performed a face to face diagnostic evaluation of this patient. Additionally, I have examined the patient's medical record including any pertinent  labs and radiographic images. If the physician assistant has documented in this note, I have reviewed and edited or otherwise concur with the physician assistant's documentation.  Thanks,  Ranelle Oyster, MD 04/12/2023

## 2023-04-12 NOTE — Plan of Care (Signed)
  Problem: Education: Goal: Knowledge of General Education information will improve Description: Including pain rating scale, medication(s)/side effects and non-pharmacologic comfort measures Outcome: Progressing   Problem: Health Behavior/Discharge Planning: Goal: Ability to manage health-related needs will improve Outcome: Progressing   Problem: Clinical Measurements: Goal: Ability to maintain clinical measurements within normal limits will improve Outcome: Progressing Goal: Will remain free from infection Outcome: Progressing Goal: Respiratory complications will improve Outcome: Progressing Goal: Cardiovascular complication will be avoided Outcome: Progressing   Problem: Activity: Goal: Risk for activity intolerance will decrease Outcome: Progressing   Problem: Nutrition: Goal: Adequate nutrition will be maintained Outcome: Progressing   Problem: Coping: Goal: Level of anxiety will decrease Outcome: Progressing   Problem: Elimination: Goal: Will not experience complications related to bowel motility Outcome: Progressing Goal: Will not experience complications related to urinary retention Outcome: Progressing   Problem: Pain Managment: Goal: General experience of comfort will improve Outcome: Progressing   Problem: Skin Integrity: Goal: Risk for impaired skin integrity will decrease Outcome: Progressing

## 2023-04-12 NOTE — Consult Note (Signed)
Consultation Note Date: 04/12/2023   Patient Name: Erika Gates  DOB: 29-May-1935  MRN: [id number]  Age / Sex: 88 y.o., female  PCP: Erika James, MD Referring Physician: Leroy Sea, MD  Reason for Consultation: Establishing goals of care  HPI/Patient Profile: 88 y.o. female  with past medical history of hypertension, hyperlipidemia, paroxysmal atrial fibrillation, CAD s/p CABG in 1996, diastolic CHF, neurologic myopathy with quadriparesis requiring cervical decompression 07/2022, diabetes mellitus type 2, and morbid obesity admitted on 04/09/2023 with lethargy and bradycardia.   Patient was admitted for acute respiratory failure with hypoxia and hypercapnia secondary to acute on chronic diastolic congestive heart failure, acute blood loss anemia secondary to GI bleed, acute metabolic encephalopathy, AKI, transient hypotension.  Family is faced with treatment decisions and medical decision making including interventions such as EGD. PMT has been consulted to assist with goals of care conversation.  Clinical Assessment and Goals of Care:  I have reviewed medical records including EPIC notes, labs and imaging, assessed the patient and then met the bedside with patient's neighbor Erika Gates and [name] participating via phone to discuss diagnosis prognosis, GOC, EOL wishes, disposition and options.  I introduced Palliative Medicine as specialized medical care for people living with serious illness. It focuses on providing relief from the symptoms and stress of a serious illness. The goal is to improve quality of life for both the patient and the family.  We discussed a brief life review of the patient and then focused on their current illness.   I attempted to elicit values and goals of care important to the patient.    Medical History Review and Understanding:  We reviewed patient's acute illness in  detail.  Patient and daughter report a good understanding after receiving updates from primary team.  Social History: Patient lives at home and her daughter has moved in with her.  She has 2 daughters and is a woman of strong faith.  She is widowed.  Functional and Nutritional State: Patient was ambulating with a walker prior to admission.  Per family, she was mostly independent with some assistance as needed from daughter.  Albumin of on 8/16 noted.  Palliative Symptoms: Patient denies any symptoms at this time   Code Status: Concepts specific to code status, artifical feeding and hydration, and rehospitalization were considered and discussed.  Patient and her daughter states that they have already talked about this and she continues to desire full CODE STATUS.  Discussion: Provided therapeutic listening and emotional support as patient's daughter and neighbor share their disappointment that she was not considered a candidate for CIR.  Allowing her the best opportunity to participate in is much rehabilitation as possible is a primary goal and priority.  They have dealt with this denial for CIR in the past, right after patient's cardiac arrest in January 2024, and feel that [facility] has "given up on her."  They feel that there is pressure in this healthcare system to push Erika Gates towards SNF and they continue to be interested in CIR, despite having very little hope that this will actually work out.  Erika Gates shares that patient currently has everything she needs at home when she does return.    Discussed the importance of continued conversation with family and the medical providers regarding overall plan of care and treatment options, ensuring decisions are within the context of the patient's values and GOCs.   Questions and concerns were addressed.  The family was encouraged to call with questions or concerns.  PMT will continue to support holistically.   SUMMARY OF RECOMMENDATIONS   -Continue full  code/full scope treatment -Goals are clear for improvement and family is very hopeful for CIR placement -Ongoing goals of care discussions pending clinical course -Psychosocial and emotional support provided -PMT will continue to follow and support as needed  Prognosis:  Unable to determine  Discharge Planning: To Be Determined      Primary Diagnoses: Present on Admission:  Acute respiratory failure with hypoxia and hypercapnia (HCC)  Diastolic congestive heart failure (HCC)  Acute blood loss anemia  GI bleed  Sinus bradycardia  Transient hypotension  SIRS (systemic inflammatory response syndrome) (HCC)  Abnormal urinalysis  Pancytopenia (HCC)  Paroxysmal atrial fibrillation (HCC)  Acute metabolic encephalopathy  GERD (gastroesophageal reflux disease)   Physical Exam Vitals and nursing note reviewed.  Constitutional:      General: She is not in acute distress.    Appearance: She is ill-appearing.     Interventions: Nasal cannula in place.     Comments: 2L  Cardiovascular:     Rate and Rhythm: Normal rate.  Skin:    General: Skin is warm and dry.  Neurological:     Mental Status: She is alert and oriented to person, place, and time.  Psychiatric:        Mood and Affect: Mood normal.        Behavior: Behavior normal.     Vital Signs: BP 128/68 (BP Location: Right Arm)   Pulse 73   Temp 97.7 F (36.5 C) (Oral)   Resp (!) 22   Wt 104.6 kg   SpO2 95%   BMI 45.04 kg/m  Pain Scale: 0-10   Pain Score: Asleep   SpO2: SpO2: 95 % O2 Device:SpO2: 95 % O2 Flow Rate: .O2 Flow Rate (L/min): 2 L/min   Palliative Assessment/Data: 40%     MDM: high    Erika Gates Erika Salles, PA-C  Palliative Medicine Team Team phone # [phone]  Thank you for allowing the Palliative Medicine Team to assist in the care of this patient. Please utilize secure chat with additional questions, if there is no response within 30 minutes please call the above phone  number.  Palliative Medicine Team providers are available by phone from 7am to 7pm daily and can be reached through the team cell phone.  Should this patient require assistance outside of these hours, please call the patient's attending physician.

## 2023-04-12 NOTE — Plan of Care (Signed)
  Problem: Clinical Measurements: Goal: Ability to maintain clinical measurements within normal limits will improve Outcome: Progressing Goal: Diagnostic test results will improve Outcome: Progressing Goal: Respiratory complications will improve Outcome: Progressing   Problem: Pain Managment: Goal: General experience of comfort will improve Outcome: Progressing

## 2023-04-12 NOTE — Progress Notes (Signed)
PROGRESS NOTE                                                                                                                                                                                                             Patient Demographics:    Erika Gates, is a 88 y.o. female, DOB - May 10, 1935, WUJ:[id number]  Outpatient Primary MD for the patient is [name], MD    LOS - 3  Admit date - 04/09/2023    Chief Complaint  Patient presents with   Bradycardia   Fatigue       Brief Narrative (HPI from H&P)   88 y.o. female with medical history significant of hypertension, hyperlipidemia, paroxysmal atrial fibrillation, CAD s/p CABG in 1996, diastolic CHF, neurologic myopathy with quadriparesis requiring cervical decompression 07/2022, diabetes mellitus type 2, and morbid obesity who presented from home after being noted to be more lethargic last night.  Workup in the ER suggestive of severe anemia, bradycardia and hypothermia, she also had mild hypercapnia, she was given packed RBC transfusion, her Eliquis was held, she was placed on PPI and BiPAP, GI was consulted and she was admitted to the hospital for further care   Subjective:   Patient in bed, appears comfortable, denies any headache, no fever, no chest pain or pressure, no shortness of breath , no abdominal pain. No focal weakness.   Assessment  & Plan :     Acute respiratory failure with hypoxia and hypercapnia secondary to acute on chronic diastolic congestive heart failure precipitated by severe anemia, hypercapnia worsened by poor respiratory effort due to hypoperfusion caused by severe anemia and poor respiratory effort.  Diuresed with Lasix, overnight BiPAP, mentation much improved, breathing much improved, transition to oxygen, up in chair in daytime if possible, I-S flutter valve for pulmonary toiletry, as needed Lasix, has been transfused 2 units of packed RBC  with good improvement, continue to monitor.   Acute blood loss anemia secondary to GI bleed  Stool Hemoccult positive, was on Eliquis, Eliquis on hold, IV PPI, s/p 2 units packed RBC, posttransfusion H&H stable, no frank melena or bright red blood per rectum by [facility] - family refused invasive procedures and no EGD, H&H stable post PRBCs, Case discussed with [facility] physician Dr. Levora Angel, initially GI was under the impression that family does not want EGD  after their discussions with family on 04/10/2023, however family informs me on 04/12/2023 that they are open to pursuing EGD, communicated with Dr. Dulce Sellar who is on-call for GI he will come and talk to the patient's family soon.  For now continue to hold Eliquis till this is sorted out.  Will also involve palliative care for long-term goals of care.   Sinus bradycardia - With EMS heart rates were reported to be in the 30s, was on high-dose beta-blocker which has been held.  Heart rate improving continue to monitor.  TSH borderline suggestive of sick thyroid.  Transient hypotension  Due to combination of severe anemia and bradycardia, much improved after supportive care  UTI.  No sepsis.  Empiric Rocephin follow cultures.   Pancytopenia  Acute on chronic.  Monitor.  Packed RBCs transfused.   Paroxysmal atrial fibrillation on chronic anticoagulation  Not on rate controlling agents currently, was on Eliquis, hold due to her anemia and monitor.   Acute metabolic encephalopathy  Initial lethargy due to hypotension and hypoperfusion, due to poor respiratory effort developed some hypercapnia, CT head unremarkable, overnight BiPAP on 04/09/2023, mentation much improved, monitor with supportive care.   AKI.  Hydrated, home Lasix held, now developing some edema and crackles, gentle Lasix x 1 on 04/11/2023, improved to monitor.   Hyperlipidemia -Continue statin   GERD  - PPI IV  Diabetes mellitus type 2, without long-term use of insulin ISS   Lab  Results  Component Value Date   HGBA1C 6.1 (H) 04/10/2023   CBG (last 3)  Recent Labs    04/11/23 1628 04/11/23 2126 04/12/23 0726  GLUCAP 101* 128* 83            Condition - Extremely Guarded  Family Communication  : Daughter bedside 04/10/2023, 04/12/2023  Code Status : Full code  Consults  : GI, palliative care for goals of care  PUD Prophylaxis : PPI   Procedures  :     CT abdomen pelvis.  1. No evidence of retroperitoneal hematoma. 2. Small right pleural effusion with atelectasis or infiltrate at the lung bases. 3. Anasarca. 4. Cholelithiasis. 5. Diverticulosis without diverticulitis. 6. Periumbilical hernia to the right of midline containing fat and ascites. 7. Aortic atherosclerosis and coronary artery calcifications.   Echocardiogram.  1. No significant change from echo done in NOv 2022.  2. Left ventricular ejection fraction, by estimation, is 70 to 75%. The left ventricle has hyperdynamic function. The left ventricle has no regional wall motion abnormalities. There is mild left ventricular hypertrophy. Left ventricular diastolic parameters are consistent with Grade II diastolic dysfunction (pseudonormalization). Elevated left atrial pressure.  3. Right ventricular systolic function is mildly reduced. The right ventricular size is mildly enlarged.  4. Left atrial size was mildly dilated.  5. The mitral valve is normal in structure. Mild mitral valve regurgitation.  6. The aortic valve is normal in structure. Aortic valve regurgitation is not visualized.  7. The inferior vena cava is dilated in size with <50% respiratory variability, suggesting right atrial pressure of 15 mmHg  CT head.  Nonacute.      Disposition Plan  :    Status is: Inpatient  DVT Prophylaxis  :  SCDs    Lab Results  Component Value Date   PLT 88 (L) 04/12/2023    Diet :  Diet Order             Diet heart healthy/carb modified Room service appropriate? Yes; Fluid consistency: Thin  Diet  effective now                    Inpatient Medications  Scheduled Meds:  fluticasone  2 spray Each Nare Daily   insulin aspart  0-9 Units Subcutaneous TID WC   midodrine  5 mg Oral BID WC   pantoprazole  40 mg Oral BID   Followed by   Melene Muller ON 05/09/2023] pantoprazole  40 mg Oral Daily   rosuvastatin  20 mg Oral QPM   sodium chloride flush  3 mL Intravenous Q12H   Continuous Infusions:  sodium chloride 10 mL/hr at 04/10/23 1427   cefTRIAXone (ROCEPHIN)  IV 2 g (04/11/23 1120)   PRN Meds:.sodium chloride, acetaminophen **OR** acetaminophen, albuterol, docusate sodium, lidocaine, polyethylene glycol, sodium chloride    Objective:   Vitals:   04/12/23 0400 04/12/23 0500 04/12/23 0724 04/12/23 0847  BP: (!) 131/58  (!) 105/51 128/68  Pulse: 77  73   Resp: 18  (!) 22   Temp: 97.8 F (36.6 C)  97.7 F (36.5 C)   TempSrc: Oral  Oral   SpO2: 96%  95%   Weight:  104.6 kg      Wt Readings from Last 3 Encounters:  04/12/23 104.6 kg  01/12/23 95.3 kg  10/27/22 95.3 kg     Intake/Output Summary (Last 24 hours) at 04/12/2023 1002 Last data filed at 04/12/2023 0852 Gross per 24 hour  Intake 3 ml  Output 1000 ml  Net -997 ml     Physical Exam  Awake Alert, No new F.N deficits, Normal affect [place].AT,PERRAL Supple Neck, No JVD,   Symmetrical Chest wall movement, Good air movement bilaterally, bibasilar crackles RRR,No Gallops,Rubs or new Murmurs,  +ve B.Sounds, Abd Soft, No tenderness,   No Cyanosis, trace edema     Data Review:    Recent Labs  Lab 04/09/23 0217 04/09/23 0450 04/09/23 2332 04/10/23 0158 04/10/23 1153 04/11/23 0209 04/12/23 0244  WBC 3.6*  --  6.6 5.8 6.1 6.4 6.7  HGB 4.9*   < > 8.1* 8.7* 8.3* 8.1* 7.9*  HCT 20.3*   < > 29.1* 30.6* 30.0* 29.4* 29.6*  PLT 106*  --  104* 94* 97* 92* 88*  MCV 75.7*  --  78.4* 78.9* 80.2 81.4 81.3  MCH 18.3*  --  21.8* 22.4* 22.2* 22.4* 21.7*  MCHC 24.1*  --  27.8* 28.4* 27.7* 27.6* 26.7*  RDW 20.2*  --   18.8* 18.8* 19.2* 20.0* 21.2*  LYMPHSABS 0.8  --   --   --   --  0.7 0.6*  MONOABS 0.3  --   --   --   --  0.7 0.9  EOSABS 0.0  --   --   --   --  0.1 0.1  BASOSABS 0.0  --   --   --   --  0.0 0.0   < > = values in this interval not displayed.    Recent Labs  Lab 04/09/23 0122 04/09/23 0127 04/09/23 0128 04/09/23 0129 04/09/23 0425 04/09/23 0450 04/09/23 1210 04/10/23 0158 04/10/23 1153 04/11/23 0209 04/12/23 0244  NA 139   < > 139  --   --  140 139 138  --  139 140  K 4.2   < > 4.2  --   --  4.3 4.4 4.3  --  4.1 3.7  CL 100  --  100  --   --   --   --  102  --  101  100  CO2 29  --   --   --   --   --   --  27  --  32 35*  ANIONGAP 10  --   --   --   --   --   --  9  --  6 5  GLUCOSE 127*  --  122*  --   --   --   --  82  --  106* 96  BUN 32*  --  34*  --   --   --   --  36*  --  31* 22  CREATININE 1.13*  --  1.20*  --   --   --   --  1.25*  --  1.34* 0.97  AST 32  --   --   --   --   --   --   --   --   --   --   ALT 22  --   --   --   --   --   --   --   --   --   --   ALKPHOS 82  --   --   --   --   --   --   --   --   --   --   BILITOT 0.5  --   --   --   --   --   --   --   --   --   --   ALBUMIN 3.2*  --   --   --   --   --   --   --   --   --   --   CRP  --   --   --   --   --   --   --  0.5  --  0.7 0.8  PROCALCITON  --   --   --   --   --   --   --   --  <0.10 <0.10 <0.10  LATICACIDVEN  --   --   --  0.8 0.5  --   --   --   --   --   --   TSH  --   --   --   --   --   --   --  6.567*  --  4.308  --   HGBA1C  --   --   --   --   --   --   --   --  6.1*  --   --   BNP 1,659.1*  --   --   --   --   --   --   --  715.5* 870.1* 895.5*  MG  --   --   --   --   --   --   --   --   --  2.3 2.2  CALCIUM 10.0  --   --   --   --   --   --  9.7  --  9.5 9.7   < > = values in this interval not displayed.      Recent Labs  Lab 04/09/23 0122 04/09/23 0129 04/09/23 0425 04/10/23 0158 04/10/23 1153 04/11/23 0209 04/12/23 0244  CRP  --   --   --  0.5  --  0.7 0.8   PROCALCITON  --   --   --   --  <0.10 <  0.10 <0.10  LATICACIDVEN  --  0.8 0.5  --   --   --   --   TSH  --   --   --  6.567*  --  4.308  --   HGBA1C  --   --   --   --  6.1*  --   --   BNP 1,659.1*  --   --   --  715.5* 870.1* 895.5*  MG  --   --   --   --   --  2.3 2.2  CALCIUM 10.0  --   --  9.7  --  9.5 9.7    Recent Labs  Lab 04/09/23 0122 04/09/23 0128 04/09/23 0129 04/09/23 0217 04/09/23 0425 04/09/23 2332 04/10/23 0158 04/10/23 1153 04/11/23 0209 04/12/23 0244  WBC  --   --   --    < >  --  6.6 5.8 6.1 6.4 6.7  PLT  --   --   --    < >  --  104* 94* 97* 92* 88*  CRP  --   --   --   --   --   --  0.5  --  0.7 0.8  PROCALCITON  --   --   --   --   --   --   --  <0.10 <0.10 <0.10  LATICACIDVEN  --   --  0.8  --  0.5  --   --   --   --   --   CREATININE 1.13* 1.20*  --   --   --   --  1.25*  --  1.34* 0.97   < > = values in this interval not displayed.   Recent Labs    04/11/23 0209  TSH 4.308      Micro Results Recent Results (from the past 240 hour(s))  SARS Coronavirus 2 by RT PCR (hospital order, performed in [facility] hospital lab) *cepheid single result test* Anterior Nasal Swab     Status: None   Collection Time: 04/09/23  1:20 AM   Specimen: Anterior Nasal Swab  Result Value Ref Range Status   SARS Coronavirus 2 by RT PCR NEGATIVE NEGATIVE Final    Comment: Performed at [facility], [address] [place], [place] [address]    Radiology Reports DG Chest [place] 1 View  Result Date: 04/11/2023 CLINICAL DATA:  Shortness of breath. EXAM: PORTABLE CHEST 1 VIEW COMPARISON:  04/10/2023 FINDINGS: Sternotomy wires unchanged. Lungs are somewhat hypoinflated with stable elevation of the right hemidiaphragm. Minimal linear scarring over the right base. Minimal prominence of the central pulmonary vessels improved compared to the previous exam and likely minimal residual vascular congestion. Mild stable cardiomegaly. No effusion or pneumothorax. Remainder of the  exam is unchanged. IMPRESSION: 1. Interval improvement with minimal residual vascular congestion. 2. Minimal linear scarring over the right base. Electronically Signed   By: Elberta Fortis M.D.   On: 04/11/2023 08:17   DG Chest Port 1 View  Result Date: 04/10/2023 CLINICAL DATA:  Shortness of breath. EXAM: PORTABLE CHEST 1 VIEW COMPARISON:  04/09/2023 FINDINGS: The cardio pericardial silhouette is enlarged. A stable asymmetric elevation right hemidiaphragm. Bibasilar atelectasis or infiltrate noted. Vascular congestion without overt pulmonary edema. Telemetry leads overlie the chest. IMPRESSION: 1. Bibasilar atelectasis or infiltrate. 2. Vascular congestion without overt pulmonary edema. Electronically Signed   By: Kennith Center M.D.   On: 04/10/2023 09:31   CT ABDOMEN PELVIS WO CONTRAST  Result Date: 04/09/2023 CLINICAL DATA:  Retroperitoneal bleed suspected. EXAM: CT ABDOMEN AND PELVIS WITHOUT CONTRAST TECHNIQUE: Multidetector CT imaging of the abdomen and pelvis was performed following the standard protocol without IV contrast. RADIATION DOSE REDUCTION: This exam was performed according to the departmental dose-optimization program which includes automated exposure control, adjustment of the mA and/or kV according to patient size and/or use of iterative reconstruction technique. COMPARISON:  None Available. FINDINGS: Lower chest: The heart is enlarged and multi-vessel coronary artery calcifications are noted. There is a small right pleural effusion with atelectasis or infiltrate at the lung bases. Hepatobiliary: No focal liver abnormality is seen. Multiple stones are present within the gallbladder. No biliary ductal dilatation. Pancreas: Unremarkable. No pancreatic ductal dilatation or surrounding inflammatory changes. Spleen: Normal in size without focal abnormality. Adrenals/Urinary Tract: The adrenal glands are within normal limits. No renal calculus or hydronephrosis. The bladder is unremarkable.  Stomach/Bowel: Stomach is within normal limits. Appendix appears normal. No evidence of bowel wall thickening, distention, or inflammatory changes. No free air or pneumatosis. Scattered diverticula are present along the colon without evidence of diverticulitis. Vascular/Lymphatic: Aortic atherosclerosis. No enlarged abdominal or pelvic lymph nodes. Reproductive: Status post hysterectomy. No adnexal masses. Other: Small amount of free fluid is noted in the pelvis. Anasarca is noted. No evidence of retroperitoneal hemorrhage is seen. There is a right lower quadrant periumbilical hernia containing ascites. Musculoskeletal: Sternotomy wires are noted. Degenerative changes are present in the thoracolumbar spine. No acute osseous abnormality. IMPRESSION: 1. No evidence of retroperitoneal hematoma. 2. Small right pleural effusion with atelectasis or infiltrate at the lung bases. 3. Anasarca. 4. Cholelithiasis. 5. Diverticulosis without diverticulitis. 6. Periumbilical hernia to the right of midline containing fat and ascites. 7. Aortic atherosclerosis and coronary artery calcifications. Electronically Signed   By: Thornell Sartorius M.D.   On: 04/09/2023 21:45   ECHOCARDIOGRAM COMPLETE  Result Date: 04/09/2023    ECHOCARDIOGRAM REPORT   Patient Name:   Erika Gates Date of Exam: 04/09/2023 Medical Rec #:  [id number]        Height:       60.0 in Accession #:    [id number]       Weight:       210.0 lb Date of Birth:  02/10/1935        BSA:          1.906 m Patient Age:    88 years         BP:           108/49 mmHg Patient Gender: F                HR:           64 bpm. Exam Location:  Inpatient Procedure: 2D Echo, Cardiac Doppler and Color Doppler Indications:    CHF-Acute Diastolic I50.31  History:        Patient has prior history of Echocardiogram examinations, most                 recent 07/14/2021. CHF, Arrythmias:Atrial Fibrillation and                 Bradycardia; Risk Factors:Hypertension and Dyslipidemia.   Sonographer:    Lucendia Herrlich Referring Phys: [id number] RONDELL A SMITH IMPRESSIONS  1. No significant change from echo done in NOv 2022.  2. Left ventricular ejection fraction, by estimation, is 70 to 75%. The left ventricle has hyperdynamic function. The left ventricle has no regional wall motion abnormalities. There  is mild left ventricular hypertrophy. Left ventricular diastolic parameters are consistent with Grade II diastolic dysfunction (pseudonormalization). Elevated left atrial pressure.  3. Right ventricular systolic function is mildly reduced. The right ventricular size is mildly enlarged.  4. Left atrial size was mildly dilated.  5. The mitral valve is normal in structure. Mild mitral valve regurgitation.  6. The aortic valve is normal in structure. Aortic valve regurgitation is not visualized.  7. The inferior vena cava is dilated in size with <50% respiratory variability, suggesting right atrial pressure of 15 mmHg. FINDINGS  Left Ventricle: Left ventricular ejection fraction, by estimation, is 70 to 75%. The left ventricle has hyperdynamic function. The left ventricle has no regional wall motion abnormalities. The left ventricular internal cavity size was normal in size. There is mild left ventricular hypertrophy. Left ventricular diastolic parameters are consistent with Grade II diastolic dysfunction (pseudonormalization). Elevated left atrial pressure. Right Ventricle: The right ventricular size is mildly enlarged. Right vetricular wall thickness was not assessed. Right ventricular systolic function is mildly reduced. Left Atrium: Left atrial size was mildly dilated. Right Atrium: Right atrial size was normal in size. Pericardium: There is no evidence of pericardial effusion. Mitral Valve: The mitral valve is normal in structure. Mild mitral valve regurgitation. Tricuspid Valve: The tricuspid valve is normal in structure. Tricuspid valve regurgitation is mild. Aortic Valve: The aortic valve is  normal in structure. Aortic valve regurgitation is not visualized. Aortic valve peak gradient measures 11.2 mmHg. Pulmonic Valve: The pulmonic valve was normal in structure. Pulmonic valve regurgitation is mild. Aorta: The aortic root and ascending aorta are structurally normal, with no evidence of dilitation. Venous: The inferior vena cava is dilated in size with less than 50% respiratory variability, suggesting right atrial pressure of 15 mmHg. IAS/Shunts: No atrial level shunt detected by color flow Doppler.  LEFT VENTRICLE PLAX 2D LVIDd:         4.20 cm   Diastology LVIDs:         2.70 cm   LV e' medial:    5.77 cm/s LV PW:         0.90 cm   LV E/e' medial:  22.9 LV IVS:        1.20 cm   LV e' lateral:   7.62 cm/s LVOT diam:     1.90 cm   LV E/e' lateral: 17.3 LV SV:         85 LV SV Index:   45 LVOT Area:     2.84 cm  RIGHT VENTRICLE            IVC RV S prime:     8.27 cm/s  IVC diam: 2.60 cm TAPSE (M-mode): 1.7 cm LEFT ATRIUM             Index        RIGHT ATRIUM           Index LA diam:        3.30 cm 1.73 cm/m   RA Area:     15.00 cm LA Vol (A2C):   49.3 ml 25.87 ml/m  RA Volume:   35.90 ml  18.84 ml/m LA Vol (A4C):   75.6 ml 39.67 ml/m LA Biplane Vol: 62.9 ml 33.01 ml/m  AORTIC VALVE AV Area (Vmax): 2.14 cm AV Vmax:        167.00 cm/s AV Peak Grad:   11.2 mmHg LVOT Vmax:      126.33 cm/s LVOT Vmean:     81.267  cm/s LVOT VTI:       0.299 m  AORTA Ao Root diam: 3.00 cm Ao Asc diam:  3.20 cm MITRAL VALVE                TRICUSPID VALVE MV Area (PHT): 3.77 cm     TR Peak grad:   49.6 mmHg MV Decel Time: 201 msec     TR Vmax:        352.00 cm/s MR Peak grad: 57.5 mmHg MR Vmax:      379.00 cm/s   SHUNTS MV E velocity: 132.00 cm/s  Systemic VTI:  0.30 m MV A velocity: 103.00 cm/s  Systemic Diam: 1.90 cm MV E/A ratio:  1.28 Dietrich Pates MD Electronically signed by Dietrich Pates MD Signature Date/Time: 04/09/2023/5:15:57 PM    Final    CT HEAD WO CONTRAST ( )  Result Date: 04/09/2023 CLINICAL DATA:  Mental  status change, unknown cause. EXAM: CT HEAD WITHOUT CONTRAST TECHNIQUE: Contiguous axial images were obtained from the base of the skull through the vertex without intravenous contrast. RADIATION DOSE REDUCTION: This exam was performed according to the departmental dose-optimization program which includes automated exposure control, adjustment of the mA and/or kV according to patient size and/or use of iterative reconstruction technique. COMPARISON:  07/31/2022. FINDINGS: Brain: No acute intracranial hemorrhage, midline shift or mass effect. No extra-axial fluid collection. Periventricular white matter hypodensities are noted bilaterally. No hydrocephalus. A stable dural-based is noted over the frontal bone on the right, possible calcified meningioma, and unchanged from the prior exam. Vascular: No hyperdense vessel or unexpected calcification. Skull: Normal. Negative for fracture or focal lesion. Sinuses/Orbits: No acute finding. Other: None. IMPRESSION: 1. No acute intracranial process. 2. Chronic microvascular ischemic changes. 3. Stable extra-axial calcification over the frontal lobe on the right, possible meningioma and unchanged from the previous exam. Electronically Signed   By: Thornell Sartorius M.D.   On: 04/09/2023 02:44   DG Chest Port 1 View  Result Date: 04/09/2023 CLINICAL DATA:  Shortness of breath EXAM: PORTABLE CHEST 1 VIEW COMPARISON:  08/16/2022 FINDINGS: Check shadow is enlarged but stable. Postsurgical changes are again seen. Elevation of the right hemidiaphragm is noted. Central vascular congestion is seen without significant edema. No focal infiltrate is noted. Postsurgical changes in the cervical spine are seen. IMPRESSION: Central vascular congestion without significant edema. Electronically Signed   By: Alcide Clever M.D.   On: 04/09/2023 01:24      Signature  -   Susa Raring M.D on 04/12/2023 at 10:02 AM   -  To page go to [email or web address]

## 2023-04-12 NOTE — Progress Notes (Addendum)
Inpatient Rehab Coordinator Note:  I met with patient, daughter, [name] at bedside to discuss CIR recommendations and goals/expectations of CIR stay.  We reviewed 3 hrs/day of therapy, physician follow up, and average length of stay 2 weeks (dependent upon progress) with goals of min A.  Family is interested in pursuing CIR. Medical director is planning to do consult. Will open with insurance for prior approval once all updated clinicals are in system.   Addendum: opened insurance for prior authorization this pm. Will continue to follow for potential CIR admission.   Rehab Admissons Coordinator [place], [place], [place] [phone]

## 2023-04-12 NOTE — Progress Notes (Signed)
PT Cancellation Note  Patient Details Name: Erika Gates MRN: [id number] DOB: 04/21/1935   Cancelled Treatment:    Reason Eval/Treat Not Completed: Fatigue/lethargy limiting ability to participate;Patient declined, no reason specified. Pt declines PT, reports she recently returned to bed and was tired from being up during the day. PT will follow up tomorrow.   Arlyss Gandy 04/12/2023, 4:30 PM

## 2023-04-13 ENCOUNTER — Inpatient Hospital Stay (HOSPITAL_COMMUNITY): Payer: Medicare PPO | Admitting: Certified Registered"

## 2023-04-13 ENCOUNTER — Encounter (HOSPITAL_COMMUNITY)
Admission: EM | Disposition: A | Discharge status: Skilled Nursing Facility | Payer: Self-pay | Source: Home / Self Care | Attending: Internal Medicine

## 2023-04-13 DIAGNOSIS — K279 Peptic ulcer, site unspecified, unspecified as acute or chronic, without hemorrhage or perforation: Secondary | ICD-10-CM | POA: Diagnosis not present

## 2023-04-13 DIAGNOSIS — K449 Diaphragmatic hernia without obstruction or gangrene: Secondary | ICD-10-CM | POA: Diagnosis not present

## 2023-04-13 DIAGNOSIS — J9601 Acute respiratory failure with hypoxia: Secondary | ICD-10-CM | POA: Diagnosis not present

## 2023-04-13 DIAGNOSIS — I5030 Unspecified diastolic (congestive) heart failure: Secondary | ICD-10-CM

## 2023-04-13 DIAGNOSIS — I130 Hypertensive heart and chronic kidney disease with heart failure and stage 1 through stage 4 chronic kidney disease, or unspecified chronic kidney disease: Secondary | ICD-10-CM

## 2023-04-13 DIAGNOSIS — J9602 Acute respiratory failure with hypercapnia: Secondary | ICD-10-CM | POA: Diagnosis not present

## 2023-04-13 HISTORY — PX: ESOPHAGOGASTRODUODENOSCOPY (EGD) WITH PROPOFOL: SHX5813

## 2023-04-13 LAB — BRAIN NATRIURETIC PEPTIDE: B Natriuretic Peptide: 819.2 pg/mL — ABNORMAL HIGH (ref 0.0–100.0)

## 2023-04-13 LAB — CBC WITH DIFFERENTIAL/PLATELET
Abs Immature Granulocytes: 0.02 10*3/uL (ref 0.00–0.07)
Basophils Absolute: 0 10*3/uL (ref 0.0–0.1)
Basophils Relative: 0 %
Eosinophils Absolute: 0.1 10*3/uL (ref 0.0–0.5)
Eosinophils Relative: 2 %
HCT: 29 % — ABNORMAL LOW (ref 36.0–46.0)
Hemoglobin: 7.8 g/dL — ABNORMAL LOW (ref 12.0–15.0)
Immature Granulocytes: 0 %
Lymphocytes Relative: 8 %
Lymphs Abs: 0.6 10*3/uL — ABNORMAL LOW (ref 0.7–4.0)
MCH: 22.5 pg — ABNORMAL LOW (ref 26.0–34.0)
MCHC: 26.9 g/dL — ABNORMAL LOW (ref 30.0–36.0)
MCV: 83.6 fL (ref 80.0–100.0)
Monocytes Absolute: 0.9 10*3/uL (ref 0.1–1.0)
Monocytes Relative: 13 %
Neutro Abs: 5.2 10*3/uL (ref 1.7–7.7)
Neutrophils Relative %: 77 %
Platelets: 87 10*3/uL — ABNORMAL LOW (ref 150–400)
RBC: 3.47 MIL/uL — ABNORMAL LOW (ref 3.87–5.11)
RDW: 22 % — ABNORMAL HIGH (ref 11.5–15.5)
WBC: 6.9 10*3/uL (ref 4.0–10.5)
nRBC: 0 % (ref 0.0–0.2)

## 2023-04-13 LAB — GLUCOSE, CAPILLARY
Glucose-Capillary: 83 mg/dL (ref 70–99)
Glucose-Capillary: 82 mg/dL (ref 70–99)
Glucose-Capillary: 143 mg/dL — ABNORMAL HIGH (ref 70–99)
Glucose-Capillary: 117 mg/dL — ABNORMAL HIGH (ref 70–99)

## 2023-04-13 LAB — BASIC METABOLIC PANEL
Anion gap: 7 (ref 5–15)
BUN: 22 mg/dL (ref 8–23)
CO2: 35 mmol/L — ABNORMAL HIGH (ref 22–32)
Calcium: 9.6 mg/dL (ref 8.9–10.3)
Chloride: 97 mmol/L — ABNORMAL LOW (ref 98–111)
Creatinine, Ser: 0.98 mg/dL (ref 0.44–1.00)
GFR, Estimated: 56 mL/min — ABNORMAL LOW (ref >60)
Glucose, Bld: 134 mg/dL — ABNORMAL HIGH (ref 70–99)
Potassium: 4.5 mmol/L (ref 3.5–5.1)
Sodium: 139 mmol/L (ref 135–145)

## 2023-04-13 LAB — C-REACTIVE PROTEIN: CRP: 2.5 mg/dL — ABNORMAL HIGH (ref <1.0)

## 2023-04-13 LAB — MAGNESIUM: Magnesium: 2.1 mg/dL (ref 1.7–2.4)

## 2023-04-13 LAB — PROCALCITONIN: Procalcitonin: <0.1 ng/mL

## 2023-04-13 SURGERY — ESOPHAGOGASTRODUODENOSCOPY (EGD) WITH PROPOFOL
Anesthesia: Monitor Anesthesia Care | Laterality: Left

## 2023-04-13 MED ORDER — LIDOCAINE 2% (20 MG/ML) 5 ML SYRINGE
INTRAMUSCULAR | Status: DC | PRN
  Administered 2023-04-13: 80 mg via INTRAVENOUS

## 2023-04-13 MED ORDER — PROPOFOL 500 MG/50ML IV EMUL
INTRAVENOUS | Status: DC | PRN
  Administered 2023-04-13: 50 ug/kg/min via INTRAVENOUS

## 2023-04-13 MED ORDER — PROPOFOL 10 MG/ML IV BOLUS
INTRAVENOUS | Status: DC | PRN
  Administered 2023-04-13: 40 mg via INTRAVENOUS
  Administered 2023-04-13: 20 mg via INTRAVENOUS

## 2023-04-13 MED ORDER — LACTATED RINGERS IV SOLN: INTRAVENOUS | Status: DC

## 2023-04-13 MED ORDER — PHENYLEPHRINE 80 MCG/ML (10ML) SYRINGE FOR IV PUSH (FOR BLOOD PRESSURE SUPPORT)
PREFILLED_SYRINGE | INTRAVENOUS | Status: DC | PRN
  Administered 2023-04-13 (×2): 160 ug via INTRAVENOUS

## 2023-04-13 SURGICAL SUPPLY — 15 items

## 2023-04-13 NOTE — Op Note (Signed)
[facility] Patient Name: Erika Gates Procedure Date : 04/13/2023 MRN: [id number] Attending MD: Willis Modena , MD, [id number] Date of Birth: [birth year] CSN: [id number] Age: 88 Admit Type: Inpatient Procedure:                Upper GI endoscopy Indications:              Acute post hemorrhagic anemia, Heme positive stool Providers:                Willis Modena, MD, Adin Hector, RN, Janae Sauce.                            Steele Berg, RN Referring MD:             [facility] Medicines:                Monitored Anesthesia Care Complications:            No immediate complications. Estimated Blood Loss:     Estimated blood loss: none. Procedure:                Pre-Anesthesia Assessment:                           - Prior to the procedure, a History and Physical                            was performed, and patient medications and                            allergies were reviewed. The patient's tolerance of                            previous anesthesia was also reviewed. The risks                            and benefits of the procedure and the sedation                            options and risks were discussed with the patient.                            All questions were answered, and informed consent                            was obtained. Prior Anticoagulants: The patient has                            taken Eliquis (apixaban), last dose was 5 days                            prior to procedure. ASA Grade Assessment: III - A                            patient with severe systemic disease. After  reviewing the risks and benefits, the patient was                            deemed in satisfactory condition to undergo the                            procedure.                           After obtaining informed consent, the endoscope was                            passed under direct vision. Throughout the                            procedure, the  patient's blood pressure, pulse, and                            oxygen saturations were monitored continuously. The                            [id number]-H190 ([id number]) Olympus endoscope was introduced                            through the mouth, and advanced to the third part                            of duodenum. The upper GI endoscopy was                            accomplished without difficulty. The patient                            tolerated the procedure well. Scope In: Scope Out: Findings:      A small hiatal hernia was present.      The exam of the esophagus was otherwise normal.      A few dispersed small erosions with no stigmata of recent bleeding were       found in the gastric body, in the gastric antrum and in the prepyloric       region of the stomach.      The exam of the stomach was otherwise normal.      Patchy mildly erythematous mucosa without active bleeding and with no       stigmata of bleeding was found in the duodenal bulb.      One non-bleeding superficial duodenal ulcer with no stigmata of bleeding       was found in the duodenal bulb. The lesion was 4 mm in largest dimension.      The exam of the duodenum was otherwise normal.      No old or fresh blood was seen to the extent of our examination. Impression:               - Small hiatal hernia.                           -  Erosive gastropathy with no stigmata of recent                            bleeding.                           - Erythematous duodenopathy.                           - Non-bleeding duodenal ulcer with no stigmata of                            bleeding.                           - No specimens collected. Recommendation:           - Return patient to hospital ward for ongoing care.                           - Soft diet today, advance as tolerated.                           - Continue present medications.                           - Pantoprazole 40 mg po bid until further notice.                            - Hold Eliquis for another one week.                           [facility] will sign-off; we will arrange                            outpatient CBC 2 weeks and outpatient office                            follow-up with [place] in 6 weeks; please call with any                            questions; thank you for the consultation. Procedure Code(s):        --- Professional ---                           [id number], Esophagogastroduodenoscopy, flexible,                            transoral; diagnostic, including collection of                            specimen(s) by brushing or washing, when performed                            (separate procedure) Diagnosis Code(s):        --- Professional ---  K44.9, Diaphragmatic hernia without obstruction or                            gangrene                           K31.89, Other diseases of stomach and duodenum                           K26.9, Duodenal ulcer, unspecified as acute or                            chronic, without hemorrhage or perforation                           D62, Acute posthemorrhagic anemia                           R19.5, Other fecal abnormalities CPT copyright 2022 [employer]. All rights reserved. The codes documented in this report are preliminary and upon coder review may  be revised to meet current compliance requirements. Willis Modena, MD 04/13/2023 12:07:11 PM This report has been signed electronically. Number of Addenda: 0

## 2023-04-13 NOTE — Progress Notes (Signed)
Occupational Therapy Treatment Patient Details Name: Erika Gates MRN: [id number] DOB: [birth year] Today's Date: 04/13/2023   History of present illness 88 y.o. female presented to ED 04/09/23 due to lethargy. +severe anemia, bradycardia and hypothermia, acute respiratory failure requiring BiPAP; +GI bleed;   PMH significant of hypertension, hyperlipidemia, paroxysmal atrial fibrillation, CAD s/p CABG in 1996, diastolic CHF, neurologic myopathy with quadriparesis requiring cervical decompression 07/2022, diabetes mellitus type 2, and morbid obesity   OT comments  Pt making steady progress towards OT goals. Focused session on progression of mobility with RW with Min A x 2 (close chair follow w/ seated rest breaks). Pt with limitations due to tachycardia (HR up to 145bpm w/ activity), increased WOB and L knee pain but motivated to continue working during session. Daughter present, hopeful for consideration of inpatient rehab based on pt's current decline from PLOF.  SpO2 >90-91% on 2 L O2 with activity      If plan is discharge home, recommend the following:  A lot of help with walking and/or transfers;A lot of help with bathing/dressing/bathroom;Assistance with cooking/housework   Equipment Recommendations  None recommended by OT (potentially hospital bed)    Recommendations for Other Services Rehab consult    Precautions / Restrictions Precautions Precautions: Fall Precaution Comments: watch HR, O2 Restrictions Weight Bearing Restrictions: No       Mobility Bed Mobility Overal bed mobility: Needs Assistance Bed Mobility: Sit to Supine, Supine to Sit     Supine to sit: Mod assist Sit to supine: Mod assist   General bed mobility comments: assist for BLE mgmt, scooting hips to EOB and lifting trunk - good effort on pt part. Assist for BLE back to bed    Transfers Overall transfer level: Needs assistance Equipment used: Rolling walker (2 wheels) Transfers: Sit to/from  Stand Sit to Stand: Min assist           General transfer comment: Min A to rise from bed and armchair in room x 2. cues for hand placement and securing chair to stand     Balance Overall balance assessment: Needs assistance Sitting-balance support: No upper extremity supported, Feet supported Sitting balance-Leahy Scale: Fair     Standing balance support: Bilateral upper extremity supported, During functional activity, Reliant on assistive device for balance Standing balance-Leahy Scale: Poor                             ADL either performed or assessed with clinical judgement   ADL Overall ADL's : Needs assistance/impaired                                     Functional mobility during ADLs: Minimal assistance;+2 for safety/equipment;Rolling walker (2 wheels);Cueing for sequencing;Cueing for safety General ADL Comments: focus on progression of standing tolerance and attempts at mobility in room to maximize ADL participation    Extremity/Trunk Assessment Upper Extremity Assessment Upper Extremity Assessment: RUE deficits/detail;LUE deficits/detail RUE Deficits / Details: Generalized weakness, decreased fine and gross motor coordinaiton. Impaired shoulder flexion at baseline. However, pt and daughter state current AROM shoulder flexion is decreased from baseline. Pt currently demonstrates AROM to approx. 90 degrees. RUE Coordination: decreased fine motor;decreased gross motor LUE Deficits / Details: Generalized weakness, decreased fine and gross motor coordinaiton. Impaired shoulder flexion at baseline. However, pt and daughter state current AROM shoulder flexion is decreased from baseline. Pt  currently demonstrates AROM to approx. 80 degrees. LUE Coordination: decreased fine motor;decreased gross motor   Lower Extremity Assessment Lower Extremity Assessment: Defer to PT evaluation        Vision   Vision Assessment?: No apparent visual deficits    Perception     Praxis      Cognition Arousal: Alert Behavior During Therapy: Flat affect, WFL for tasks assessed/performed Overall Cognitive Status: Impaired/Different from baseline Area of Impairment: Safety/judgement, Problem solving, Awareness                         Safety/Judgement: Decreased awareness of deficits, Decreased awareness of safety Awareness: Emergent Problem Solving: Slow processing, Requires verbal cues, Requires tactile cues          Exercises      Shoulder Instructions       General Comments Daughter present and supportive    Pertinent Vitals/ Pain       Pain Assessment Pain Assessment: Faces Faces Pain Scale: Hurts little more Pain Location: L knee Pain Descriptors / Indicators: Grimacing, Guarding Pain Intervention(s): Monitored during session, Limited activity within patient's tolerance  Home Living                                          Prior Functioning/Environment              Frequency  Min 1X/week        Progress Toward Goals  OT Goals(current goals can now be found in the care plan section)  Progress towards OT goals: Progressing toward goals  Acute Rehab OT Goals Patient Stated Goal: be able to walk around home OT Goal Formulation: With patient/family Time For Goal Achievement: 04/24/23 Potential to Achieve Goals: Good ADL Goals Pt Will Perform Grooming: with set-up;sitting Pt Will Perform Lower Body Bathing: with adaptive equipment;sitting/lateral leans;sit to/from stand;with mod assist Pt Will Perform Lower Body Dressing: with adaptive equipment;sitting/lateral leans;sit to/from stand;with min assist Pt Will Transfer to Toilet: with supervision;ambulating;bedside commode Pt Will Perform Toileting - Clothing Manipulation and hygiene: sit to/from stand;with mod assist Pt/caregiver will Perform Home Exercise Program: Increased ROM;Increased strength;Both right and left upper  extremity;With theraband;With theraputty;With Supervision;With written HEP provided  Plan      Co-evaluation                 AM-PAC OT "6 Clicks" Daily Activity     Outcome Measure   Help from another person eating meals?: None Help from another person taking care of personal grooming?: A Little Help from another person toileting, which includes using toliet, bedpan, or urinal?: Total Help from another person bathing (including washing, rinsing, drying)?: A Lot Help from another person to put on and taking off regular upper body clothing?: A Little Help from another person to put on and taking off regular lower body clothing?: A Lot 6 Click Score: 15    End of Session Equipment Utilized During Treatment: Gait belt;Rolling walker (2 wheels);Oxygen  OT Visit Diagnosis: Unsteadiness on feet (R26.81);Other abnormalities of gait and mobility (R26.89);Muscle weakness (generalized) (M62.81);Ataxia, unspecified (R27.0)   Activity Tolerance Patient tolerated treatment well   Patient Left in bed;with call bell/phone within reach;with family/visitor present   Nurse Communication          Time: 1610-9604 OT Time Calculation (min): 36 min  Charges: OT General Charges $OT Visit: 1 Visit  OT Treatments $Self Care/Home Management : 8-22 mins $Therapeutic Activity: 8-22 mins  Bradd Canary, OTR/L Acute Rehab Services Office: [phone]   Lorre Munroe 04/13/2023, 11:23 AM

## 2023-04-13 NOTE — Transfer of Care (Signed)
Immediate Anesthesia Transfer of Care Note  Patient: Erika Gates  Procedure(s) Performed: ESOPHAGOGASTRODUODENOSCOPY (EGD) WITH PROPOFOL (Left)  Patient Location: PACU and Endoscopy Unit  Anesthesia Type:MAC  Level of Consciousness: awake and alert   Airway & Oxygen Therapy: Patient Spontanous Breathing and Patient connected to nasal cannula oxygen  Post-op Assessment: Report given to RN and Post -op Vital signs reviewed and stable  Post vital signs: Reviewed and stable  Last Vitals:  Vitals Value Taken Time  BP 102/76 04/13/23 1200  Temp    Pulse 83 04/13/23 1201  Resp 21 04/13/23 1201  SpO2 95 % 04/13/23 1201  Vitals shown include unfiled device data.  Last Pain:  Vitals:   04/13/23 1103  TempSrc: Temporal  PainSc: 5          Complications: No notable events documented.

## 2023-04-13 NOTE — Progress Notes (Signed)
Inpatient Rehabilitation Admissions Coordinator   I await medical workup completion and insurance approval for possible CIR admit.  Ottie Glazier, RN, MSN Rehab Admissions Coordinator [phone] 04/13/2023 11:37 AM

## 2023-04-13 NOTE — Interval H&P Note (Signed)
History and Physical Interval Note:  04/13/2023 11:31 AM  Erika Gates  has presented today for surgery, with the diagnosis of anemia, hemoccult positive stool.  The various methods of treatment have been discussed with the patient and family. After consideration of risks, benefits and other options for treatment, the patient has consented to  Procedure(s): ESOPHAGOGASTRODUODENOSCOPY (EGD) WITH PROPOFOL (Left) as a surgical intervention.  The patient's history has been reviewed, patient examined, no change in status, stable for surgery.  I have reviewed the patient's chart and labs.  Questions were answered to the patient's satisfaction.     [name]

## 2023-04-13 NOTE — Plan of Care (Signed)
  Problem: Health Behavior/Discharge Planning: Goal: Ability to manage health-related needs will improve Outcome: Progressing   Problem: Clinical Measurements: Goal: Ability to maintain clinical measurements within normal limits will improve Outcome: Progressing Goal: Diagnostic test results will improve Outcome: Progressing Goal: Respiratory complications will improve Outcome: Progressing   

## 2023-04-13 NOTE — Progress Notes (Signed)
PT Cancellation Note  Patient Details Name: Erika Gates MRN: [id number] DOB: October 20, 1934   Cancelled Treatment:    Reason Eval/Treat Not Completed: Patient at procedure or test/unavailable   Jerolyn Center, PT Acute Rehabilitation Services  Office [phone]  Zena Amos 04/13/2023, 11:40 AM

## 2023-04-13 NOTE — Anesthesia Preprocedure Evaluation (Signed)
Anesthesia Evaluation  Patient identified by MRN, date of birth, ID band Patient awake    Reviewed: Allergy & Precautions, NPO status , Patient's Chart, lab work & pertinent test results, reviewed documented beta blocker date and time   History of Anesthesia Complications Negative for: history of anesthetic complications  Airway Mallampati: III  TM Distance: >3 FB Neck ROM: Limited    Dental  (+) Edentulous Upper   Pulmonary neg pulmonary ROS   breath sounds clear to auscultation + decreased breath sounds      Cardiovascular hypertension, + CAD, +CHF and + Orthopnea  + dysrhythmias Atrial Fibrillation  Rhythm:Regular Rate:Normal     Neuro/Psych   Anxiety        GI/Hepatic ,GERD  ,,  Endo/Other  diabetes  Morbid obesity  Renal/GU CRFRenal disease     Musculoskeletal  (+) Arthritis ,    Abdominal   Peds  Hematology  (+) Blood dyscrasia, anemia   Anesthesia Other Findings   Reproductive/Obstetrics                              Anesthesia Physical Anesthesia Plan  ASA: 3  Anesthesia Plan: MAC   Post-op Pain Management:    Induction:   PONV Risk Score and Plan: 1 and Ondansetron and Propofol infusion  Airway Management Planned:   Additional Equipment:   Intra-op Plan:   Post-operative Plan:   Informed Consent: I have reviewed the patients History and Physical, chart, labs and discussed the procedure including the risks, benefits and alternatives for the proposed anesthesia with the patient or authorized representative who has indicated his/her understanding and acceptance.     Dental advisory given  Plan Discussed with: CRNA  Anesthesia Plan Comments:          Anesthesia Quick Evaluation

## 2023-04-13 NOTE — Progress Notes (Signed)
PROGRESS NOTE                                                                                                                                                                                                             Patient Demographics:    Erika Gates, is a 88 y.o. female, DOB - Dec 31, 1934, ZOX:[id number]  Outpatient Primary MD for the patient is [name], MD    LOS - 4  Admit date - 04/09/2023    Chief Complaint  Patient presents with   Bradycardia   Fatigue       Brief Narrative (HPI from H&P)   88 y.o. female with medical history significant of hypertension, hyperlipidemia, paroxysmal atrial fibrillation, CAD s/p CABG in 1996, diastolic CHF, neurologic myopathy with quadriparesis requiring cervical decompression 07/2022, diabetes mellitus type 2, and morbid obesity who presented from home after being noted to be more lethargic last night.  Workup in the ER suggestive of severe anemia, bradycardia and hypothermia, she also had mild hypercapnia, she was given packed RBC transfusion, her Eliquis was held, she was placed on PPI and BiPAP, GI was consulted and she was admitted to the hospital for further care   Subjective:   Patient in bed, appears comfortable, denies any headache, no fever, no chest pain or pressure, no shortness of breath , no abdominal pain. No focal weakness.   Assessment  & Plan :     Acute respiratory failure with hypoxia and hypercapnia secondary to acute on chronic diastolic congestive heart failure precipitated by severe anemia, hypercapnia worsened by poor respiratory effort due to hypoperfusion caused by severe anemia and poor respiratory effort.  Diuresed with Lasix, overnight BiPAP, mentation much improved, breathing much improved, transition to oxygen, up in chair in daytime if possible, I-S flutter valve for pulmonary toiletry, as needed Lasix, has been transfused 2 units of packed RBC  with good improvement, continue to monitor.   Acute blood loss anemia secondary to GI bleed  Stool Hemoccult positive, was on Eliquis, Eliquis on hold, IV PPI, s/p 2 units packed RBC, posttransfusion H&H stable, no frank melena or bright red blood per rectum by [facility] - family refused invasive procedures and no EGD, H&H stable post PRBCs, family initially hesitant for EGD now has agreed EGD by Dr. Dulce Sellar on 04/13/2023, decision of Eliquis depending on EGD  results and GI recommendations, will also involve palliative care for long-term goals of care.   Sinus bradycardia - With EMS heart rates were reported to be in the 30s, was on high-dose beta-blocker which has been held.  Heart rate improving continue to monitor.  TSH borderline suggestive of sick thyroid.  Transient hypotension  Due to combination of severe anemia and bradycardia, much improved after supportive care  UTI.  No sepsis.  Empiric Rocephin follow cultures.   Pancytopenia  Acute on chronic.  Monitor.  Packed RBCs transfused.   Paroxysmal atrial fibrillation on chronic anticoagulation  Not on rate controlling agents currently, was on Eliquis, hold due to her anemia and monitor.   Acute metabolic encephalopathy  Initial lethargy due to hypotension and hypoperfusion, due to poor respiratory effort developed some hypercapnia, CT head unremarkable, overnight BiPAP on 04/09/2023, mentation much improved, monitor with supportive care.   AKI.  Stable and resolved after transfusion, currently getting as needed Lasix  Hyperlipidemia -Continue statin   GERD  - PPI IV  Diabetes mellitus type 2, without long-term use of insulin ISS   Lab Results  Component Value Date   HGBA1C 6.1 (H) 04/10/2023   CBG (last 3)  Recent Labs    04/12/23 1700 04/12/23 2138 04/13/23 0821  GLUCAP 111* 127* 83            Condition - Extremely Guarded  Family Communication  : Daughter bedside 04/10/2023, 04/12/2023, 04/13/2023  Code Status : Full  code  Consults  : GI, palliative care for goals of care  PUD Prophylaxis : PPI   Procedures  :     EGD due 04/13/2023.    CT abdomen pelvis.  1. No evidence of retroperitoneal hematoma. 2. Small right pleural effusion with atelectasis or infiltrate at the lung bases. 3. Anasarca. 4. Cholelithiasis. 5. Diverticulosis without diverticulitis. 6. Periumbilical hernia to the right of midline containing fat and ascites. 7. Aortic atherosclerosis and coronary artery calcifications.   Echocardiogram.  1. No significant change from echo done in NOv 2022.  2. Left ventricular ejection fraction, by estimation, is 70 to 75%. The left ventricle has hyperdynamic function. The left ventricle has no regional wall motion abnormalities. There is mild left ventricular hypertrophy. Left ventricular diastolic parameters are consistent with Grade II diastolic dysfunction (pseudonormalization). Elevated left atrial pressure.  3. Right ventricular systolic function is mildly reduced. The right ventricular size is mildly enlarged.  4. Left atrial size was mildly dilated.  5. The mitral valve is normal in structure. Mild mitral valve regurgitation.  6. The aortic valve is normal in structure. Aortic valve regurgitation is not visualized.  7. The inferior vena cava is dilated in size with <50% respiratory variability, suggesting right atrial pressure of 15 mmHg  CT head.  Nonacute.      Disposition Plan  :    Status is: Inpatient  DVT Prophylaxis  :  SCDs    Lab Results  Component Value Date   PLT 87 (L) 04/13/2023    Diet :  Diet Order             Diet NPO time specified Except for: Sips with Meds  Diet effective midnight                    Inpatient Medications  Scheduled Meds:  fluticasone  2 spray Each Nare Daily   insulin aspart  0-9 Units Subcutaneous TID WC   midodrine  5 mg Oral BID WC  pantoprazole  40 mg Oral BID   Followed by   Melene Muller ON 05/09/2023] pantoprazole  40 mg Oral Daily    rosuvastatin  20 mg Oral QPM   sodium chloride flush  3 mL Intravenous Q12H   Continuous Infusions:  sodium chloride 10 mL/hr at 04/10/23 1427   cefTRIAXone (ROCEPHIN)  IV 2 g (04/12/23 1239)   PRN Meds:.sodium chloride, acetaminophen **OR** acetaminophen, albuterol, docusate sodium, lidocaine, naphazoline-pheniramine, polyethylene glycol, sodium chloride    Objective:   Vitals:   04/12/23 1700 04/12/23 2008 04/13/23 0000 04/13/23 0800  BP: 138/73 (!) 121/55 (!) 107/51 137/64  Pulse: 81 83 86 84  Resp: 17 20 18 16   Temp: 97.6 F (36.4 C) 97.8 F (36.6 C) 97.8 F (36.6 C) 98.3 F (36.8 C)  TempSrc: Oral Oral Oral Oral  SpO2: 98% 95% 97% 96%  Weight:        Wt Readings from Last 3 Encounters:  04/12/23 104.6 kg  01/12/23 95.3 kg  10/27/22 95.3 kg     Intake/Output Summary (Last 24 hours) at 04/13/2023 1042 Last data filed at 04/13/2023 0823 Gross per 24 hour  Intake 3 ml  Output 900 ml  Net -897 ml     Physical Exam  Awake Alert, No new F.N deficits, Normal affect [place].AT,PERRAL Supple Neck, No JVD,   Symmetrical Chest wall movement, Good air movement bilaterally, bibasilar crackles RRR,No Gallops,Rubs or new Murmurs,  +ve B.Sounds, Abd Soft, No tenderness,   No Cyanosis, trace edema     Data Review:    Recent Labs  Lab 04/09/23 0217 04/09/23 0450 04/10/23 1153 04/11/23 0209 04/12/23 0244 04/12/23 1149 04/13/23 0116  WBC 3.6*   < > 6.1 6.4 6.7 7.6 6.9  HGB 4.9*   < > 8.3* 8.1* 7.9* 8.3* 7.8*  HCT 20.3*   < > 30.0* 29.4* 29.6* 30.4* 29.0*  PLT 106*   < > 97* 92* 88* 92* 87*  MCV 75.7*   < > 80.2 81.4 81.3 82.4 83.6  MCH 18.3*   < > 22.2* 22.4* 21.7* 22.5* 22.5*  MCHC 24.1*   < > 27.7* 27.6* 26.7* 27.3* 26.9*  RDW 20.2*   < > 19.2* 20.0* 21.2* 21.3* 22.0*  LYMPHSABS 0.8  --   --  0.7 0.6*  --  0.6*  MONOABS 0.3  --   --  0.7 0.9  --  0.9  EOSABS 0.0  --   --  0.1 0.1  --  0.1  BASOSABS 0.0  --   --  0.0 0.0  --  0.0   < > = values in this  interval not displayed.    Recent Labs  Lab 04/09/23 0122 04/09/23 0127 04/09/23 0128 04/09/23 0129 04/09/23 0425 04/09/23 0450 04/09/23 1210 04/10/23 0158 04/10/23 1153 04/11/23 0209 04/12/23 0244 04/13/23 0116  NA 139   < > 139  --   --    < > 139 138  --  139 140 139  K 4.2   < > 4.2  --   --    < > 4.4 4.3  --  4.1 3.7 4.5  CL 100  --  100  --   --   --   --  102  --  101 100 97*  CO2 29  --   --   --   --   --   --  27  --  32 35* 35*  ANIONGAP 10  --   --   --   --   --   --  9  --  6 5 7   GLUCOSE 127*  --  122*  --   --   --   --  82  --  106* 96 134*  BUN 32*  --  34*  --   --   --   --  36*  --  31* 22 22  CREATININE 1.13*  --  1.20*  --   --   --   --  1.25*  --  1.34* 0.97 0.98  AST 32  --   --   --   --   --   --   --   --   --   --   --   ALT 22  --   --   --   --   --   --   --   --   --   --   --   ALKPHOS 82  --   --   --   --   --   --   --   --   --   --   --   BILITOT 0.5  --   --   --   --   --   --   --   --   --   --   --   ALBUMIN 3.2*  --   --   --   --   --   --   --   --   --   --   --   CRP  --   --   --   --   --   --   --  0.5  --  0.7 0.8 2.5*  PROCALCITON  --   --   --   --   --   --   --   --  <0.10 <0.10 <0.10 <0.10  LATICACIDVEN  --   --   --  0.8 0.5  --   --   --   --   --   --   --   TSH  --   --   --   --   --   --   --  6.567*  --  4.308  --   --   HGBA1C  --   --   --   --   --   --   --   --  6.1*  --   --   --   BNP 1,659.1*  --   --   --   --   --   --   --  715.5* 870.1* 895.5* 819.2*  MG  --   --   --   --   --   --   --   --   --  2.3 2.2 2.1  CALCIUM 10.0  --   --   --   --   --   --  9.7  --  9.5 9.7 9.6   < > = values in this interval not displayed.      Recent Labs  Lab 04/09/23 0122 04/09/23 0129 04/09/23 0425 04/10/23 0158 04/10/23 1153 04/11/23 0209 04/12/23 0244 04/13/23 0116  CRP  --   --   --  0.5  --  0.7 0.8 2.5*  PROCALCITON  --   --   --   --  <0.10 <0.10 <0.10 <0.10  LATICACIDVEN  --  0.8 0.5  --   --    --   --   --  TSH  --   --   --  6.567*  --  4.308  --   --   HGBA1C  --   --   --   --  6.1*  --   --   --   BNP 1,659.1*  --   --   --  715.5* 870.1* 895.5* 819.2*  MG  --   --   --   --   --  2.3 2.2 2.1  CALCIUM 10.0  --   --  9.7  --  9.5 9.7 9.6    Recent Labs  Lab 04/09/23 0128 04/09/23 0129 04/09/23 0217 04/09/23 0425 04/09/23 2332 04/10/23 0158 04/10/23 1153 04/11/23 0209 04/12/23 0244 04/12/23 1149 04/13/23 0116  WBC  --   --    < >  --    < > 5.8 6.1 6.4 6.7 7.6 6.9  PLT  --   --    < >  --    < > 94* 97* 92* 88* 92* 87*  CRP  --   --   --   --   --  0.5  --  0.7 0.8  --  2.5*  PROCALCITON  --   --   --   --   --   --  <0.10 <0.10 <0.10  --  <0.10  LATICACIDVEN  --  0.8  --  0.5  --   --   --   --   --   --   --   CREATININE 1.20*  --   --   --   --  1.25*  --  1.34* 0.97  --  0.98   < > = values in this interval not displayed.   Recent Labs    04/11/23 0209  TSH 4.308      Micro Results Recent Results (from the past 240 hour(s))  SARS Coronavirus 2 by RT PCR (hospital order, performed in [facility] hospital lab) *cepheid single result test* Anterior Nasal Swab     Status: None   Collection Time: 04/09/23  1:20 AM   Specimen: Anterior Nasal Swab  Result Value Ref Range Status   SARS Coronavirus 2 by RT PCR NEGATIVE NEGATIVE Final    Comment: Performed at [facility], [address], [place], [place] [address]    Radiology Reports DG Chest [place] 1 View  Result Date: 04/11/2023 CLINICAL DATA:  Shortness of breath. EXAM: PORTABLE CHEST 1 VIEW COMPARISON:  04/10/2023 FINDINGS: Sternotomy wires unchanged. Lungs are somewhat hypoinflated with stable elevation of the right hemidiaphragm. Minimal linear scarring over the right base. Minimal prominence of the central pulmonary vessels improved compared to the previous exam and likely minimal residual vascular congestion. Mild stable cardiomegaly. No effusion or pneumothorax. Remainder of the exam is  unchanged. IMPRESSION: 1. Interval improvement with minimal residual vascular congestion. 2. Minimal linear scarring over the right base. Electronically Signed   By: Elberta Fortis M.D.   On: 04/11/2023 08:17   DG Chest Port 1 View  Result Date: 04/10/2023 CLINICAL DATA:  Shortness of breath. EXAM: PORTABLE CHEST 1 VIEW COMPARISON:  04/09/2023 FINDINGS: The cardio pericardial silhouette is enlarged. A stable asymmetric elevation right hemidiaphragm. Bibasilar atelectasis or infiltrate noted. Vascular congestion without overt pulmonary edema. Telemetry leads overlie the chest. IMPRESSION: 1. Bibasilar atelectasis or infiltrate. 2. Vascular congestion without overt pulmonary edema. Electronically Signed   By: Kennith Center M.D.   On: 04/10/2023 09:31   CT ABDOMEN PELVIS WO  CONTRAST  Result Date: 04/09/2023 CLINICAL DATA:  Retroperitoneal bleed suspected. EXAM: CT ABDOMEN AND PELVIS WITHOUT CONTRAST TECHNIQUE: Multidetector CT imaging of the abdomen and pelvis was performed following the standard protocol without IV contrast. RADIATION DOSE REDUCTION: This exam was performed according to the departmental dose-optimization program which includes automated exposure control, adjustment of the mA and/or kV according to patient size and/or use of iterative reconstruction technique. COMPARISON:  None Available. FINDINGS: Lower chest: The heart is enlarged and multi-vessel coronary artery calcifications are noted. There is a small right pleural effusion with atelectasis or infiltrate at the lung bases. Hepatobiliary: No focal liver abnormality is seen. Multiple stones are present within the gallbladder. No biliary ductal dilatation. Pancreas: Unremarkable. No pancreatic ductal dilatation or surrounding inflammatory changes. Spleen: Normal in size without focal abnormality. Adrenals/Urinary Tract: The adrenal glands are within normal limits. No renal calculus or hydronephrosis. The bladder is unremarkable.  Stomach/Bowel: Stomach is within normal limits. Appendix appears normal. No evidence of bowel wall thickening, distention, or inflammatory changes. No free air or pneumatosis. Scattered diverticula are present along the colon without evidence of diverticulitis. Vascular/Lymphatic: Aortic atherosclerosis. No enlarged abdominal or pelvic lymph nodes. Reproductive: Status post hysterectomy. No adnexal masses. Other: Small amount of free fluid is noted in the pelvis. Anasarca is noted. No evidence of retroperitoneal hemorrhage is seen. There is a right lower quadrant periumbilical hernia containing ascites. Musculoskeletal: Sternotomy wires are noted. Degenerative changes are present in the thoracolumbar spine. No acute osseous abnormality. IMPRESSION: 1. No evidence of retroperitoneal hematoma. 2. Small right pleural effusion with atelectasis or infiltrate at the lung bases. 3. Anasarca. 4. Cholelithiasis. 5. Diverticulosis without diverticulitis. 6. Periumbilical hernia to the right of midline containing fat and ascites. 7. Aortic atherosclerosis and coronary artery calcifications. Electronically Signed   By: Thornell Sartorius M.D.   On: 04/09/2023 21:45   ECHOCARDIOGRAM COMPLETE  Result Date: 04/09/2023    ECHOCARDIOGRAM REPORT   Patient Name:   LOELLA FORINO Date of Exam: 04/09/2023 Medical Rec #:  [id number]        Height:       60.0 in Accession #:    [id number]       Weight:       210.0 lb Date of Birth:  August 29, 1934        BSA:          1.906 m Patient Age:    88 years         BP:           108/49 mmHg Patient Gender: F                HR:           64 bpm. Exam Location:  Inpatient Procedure: 2D Echo, Cardiac Doppler and Color Doppler Indications:    CHF-Acute Diastolic I50.31  History:        Patient has prior history of Echocardiogram examinations, most                 recent 07/14/2021. CHF, Arrythmias:Atrial Fibrillation and                 Bradycardia; Risk Factors:Hypertension and Dyslipidemia.   Sonographer:    Lucendia Herrlich Referring Phys: [id number] RONDELL A SMITH IMPRESSIONS  1. No significant change from echo done in NOv 2022.  2. Left ventricular ejection fraction, by estimation, is 70 to 75%. The left ventricle has hyperdynamic function. The left ventricle has no regional wall motion  abnormalities. There is mild left ventricular hypertrophy. Left ventricular diastolic parameters are consistent with Grade II diastolic dysfunction (pseudonormalization). Elevated left atrial pressure.  3. Right ventricular systolic function is mildly reduced. The right ventricular size is mildly enlarged.  4. Left atrial size was mildly dilated.  5. The mitral valve is normal in structure. Mild mitral valve regurgitation.  6. The aortic valve is normal in structure. Aortic valve regurgitation is not visualized.  7. The inferior vena cava is dilated in size with <50% respiratory variability, suggesting right atrial pressure of 15 mmHg. FINDINGS  Left Ventricle: Left ventricular ejection fraction, by estimation, is 70 to 75%. The left ventricle has hyperdynamic function. The left ventricle has no regional wall motion abnormalities. The left ventricular internal cavity size was normal in size. There is mild left ventricular hypertrophy. Left ventricular diastolic parameters are consistent with Grade II diastolic dysfunction (pseudonormalization). Elevated left atrial pressure. Right Ventricle: The right ventricular size is mildly enlarged. Right vetricular wall thickness was not assessed. Right ventricular systolic function is mildly reduced. Left Atrium: Left atrial size was mildly dilated. Right Atrium: Right atrial size was normal in size. Pericardium: There is no evidence of pericardial effusion. Mitral Valve: The mitral valve is normal in structure. Mild mitral valve regurgitation. Tricuspid Valve: The tricuspid valve is normal in structure. Tricuspid valve regurgitation is mild. Aortic Valve: The aortic valve is  normal in structure. Aortic valve regurgitation is not visualized. Aortic valve peak gradient measures 11.2 mmHg. Pulmonic Valve: The pulmonic valve was normal in structure. Pulmonic valve regurgitation is mild. Aorta: The aortic root and ascending aorta are structurally normal, with no evidence of dilitation. Venous: The inferior vena cava is dilated in size with less than 50% respiratory variability, suggesting right atrial pressure of 15 mmHg. IAS/Shunts: No atrial level shunt detected by color flow Doppler.  LEFT VENTRICLE PLAX 2D LVIDd:         4.20 cm   Diastology LVIDs:         2.70 cm   LV e' medial:    5.77 cm/s LV PW:         0.90 cm   LV E/e' medial:  22.9 LV IVS:        1.20 cm   LV e' lateral:   7.62 cm/s LVOT diam:     1.90 cm   LV E/e' lateral: 17.3 LV SV:         85 LV SV Index:   45 LVOT Area:     2.84 cm  RIGHT VENTRICLE            IVC RV S prime:     8.27 cm/s  IVC diam: 2.60 cm TAPSE (M-mode): 1.7 cm LEFT ATRIUM             Index        RIGHT ATRIUM           Index LA diam:        3.30 cm 1.73 cm/m   RA Area:     15.00 cm LA Vol (A2C):   49.3 ml 25.87 ml/m  RA Volume:   35.90 ml  18.84 ml/m LA Vol (A4C):   75.6 ml 39.67 ml/m LA Biplane Vol: 62.9 ml 33.01 ml/m  AORTIC VALVE AV Area (Vmax): 2.14 cm AV Vmax:        167.00 cm/s AV Peak Grad:   11.2 mmHg LVOT Vmax:      126.33 cm/s LVOT Vmean:  81.267 cm/s LVOT VTI:       0.299 m  AORTA Ao Root diam: 3.00 cm Ao Asc diam:  3.20 cm MITRAL VALVE                TRICUSPID VALVE MV Area (PHT): 3.77 cm     TR Peak grad:   49.6 mmHg MV Decel Time: 201 msec     TR Vmax:        352.00 cm/s MR Peak grad: 57.5 mmHg MR Vmax:      379.00 cm/s   SHUNTS MV E velocity: 132.00 cm/s  Systemic VTI:  0.30 m MV A velocity: 103.00 cm/s  Systemic Diam: 1.90 cm MV E/A ratio:  1.28 Dietrich Pates MD Electronically signed by Dietrich Pates MD Signature Date/Time: 04/09/2023/5:15:57 PM    Final       Signature  -   Susa Raring M.D on 04/13/2023 at 10:42 AM   -  To page  go to [email or web address]

## 2023-04-13 NOTE — Progress Notes (Signed)
SLP Cancellation Note  Patient Details Name: KERRIE COLLUMS MRN: [id number] DOB: 09-Jun-1935   Cancelled treatment:       Reason Eval/Treat Not Completed: Other (comment) (NPO for EGD today)   Angela Nevin, MA, CCC-SLP Speech Therapy

## 2023-04-13 NOTE — Progress Notes (Addendum)
Heart Failure Navigator Progress Note  Assessed for Heart & Vascular TOC clinic readiness.   Patient admitted with acute respiratory failure with hypoxia and hypercapnia. BNP 1659 and EF 70-75%. Has a PMH of neurologic myopathy with quadriparesis requiring cervical decompression 07/2022. Discharging to CIR. Will not schedule HF TOC appt at this time.   Sharen Hones, PharmD, BCPS Heart Failure Stewardship Pharmacist Phone [phone]

## 2023-04-14 DIAGNOSIS — J9601 Acute respiratory failure with hypoxia: Secondary | ICD-10-CM | POA: Diagnosis not present

## 2023-04-14 DIAGNOSIS — I5033 Acute on chronic diastolic (congestive) heart failure: Secondary | ICD-10-CM | POA: Diagnosis not present

## 2023-04-14 DIAGNOSIS — J9602 Acute respiratory failure with hypercapnia: Secondary | ICD-10-CM | POA: Diagnosis not present

## 2023-04-14 LAB — BASIC METABOLIC PANEL
Anion gap: 6 (ref 5–15)
BUN: 14 mg/dL (ref 8–23)
CO2: 36 mmol/L — ABNORMAL HIGH (ref 22–32)
Calcium: 9.7 mg/dL (ref 8.9–10.3)
Chloride: 97 mmol/L — ABNORMAL LOW (ref 98–111)
Creatinine, Ser: 0.73 mg/dL (ref 0.44–1.00)
GFR, Estimated: >60 mL/min (ref >60)
Glucose, Bld: 96 mg/dL (ref 70–99)
Potassium: 4.5 mmol/L (ref 3.5–5.1)
Sodium: 139 mmol/L (ref 135–145)

## 2023-04-14 LAB — CBC WITH DIFFERENTIAL/PLATELET
Abs Immature Granulocytes: 0.01 10*3/uL (ref 0.00–0.07)
Basophils Absolute: 0 10*3/uL (ref 0.0–0.1)
Basophils Relative: 0 %
Eosinophils Absolute: 0.2 10*3/uL (ref 0.0–0.5)
Eosinophils Relative: 3 %
HCT: 29.5 % — ABNORMAL LOW (ref 36.0–46.0)
Hemoglobin: 7.8 g/dL — ABNORMAL LOW (ref 12.0–15.0)
Immature Granulocytes: 0 %
Lymphocytes Relative: 9 %
Lymphs Abs: 0.6 10*3/uL — ABNORMAL LOW (ref 0.7–4.0)
MCH: 22.2 pg — ABNORMAL LOW (ref 26.0–34.0)
MCHC: 26.4 g/dL — ABNORMAL LOW (ref 30.0–36.0)
MCV: 84 fL (ref 80.0–100.0)
Monocytes Absolute: 1 10*3/uL (ref 0.1–1.0)
Monocytes Relative: 14 %
Neutro Abs: 5 10*3/uL (ref 1.7–7.7)
Neutrophils Relative %: 74 %
Platelets: 86 10*3/uL — ABNORMAL LOW (ref 150–400)
RBC: 3.51 MIL/uL — ABNORMAL LOW (ref 3.87–5.11)
RDW: 22.7 % — ABNORMAL HIGH (ref 11.5–15.5)
WBC: 6.7 10*3/uL (ref 4.0–10.5)
nRBC: 0 % (ref 0.0–0.2)

## 2023-04-14 LAB — BRAIN NATRIURETIC PEPTIDE: B Natriuretic Peptide: 1263 pg/mL — ABNORMAL HIGH (ref 0.0–100.0)

## 2023-04-14 LAB — GLUCOSE, CAPILLARY
Glucose-Capillary: 82 mg/dL (ref 70–99)
Glucose-Capillary: 100 mg/dL — ABNORMAL HIGH (ref 70–99)
Glucose-Capillary: 123 mg/dL — ABNORMAL HIGH (ref 70–99)
Glucose-Capillary: 112 mg/dL — ABNORMAL HIGH (ref 70–99)
Glucose-Capillary: 103 mg/dL — ABNORMAL HIGH (ref 70–99)

## 2023-04-14 LAB — MAGNESIUM: Magnesium: 2 mg/dL (ref 1.7–2.4)

## 2023-04-14 LAB — PROCALCITONIN: Procalcitonin: <0.1 ng/mL

## 2023-04-14 LAB — C-REACTIVE PROTEIN: CRP: 4.1 mg/dL — ABNORMAL HIGH (ref <1.0)

## 2023-04-14 MED ORDER — GUAIFENESIN 100 MG/5ML PO LIQD
5.0000 mL | ORAL | Status: DC | PRN
  Administered 2023-04-14: 5 mL via ORAL
  Filled 2023-04-14: qty 5

## 2023-04-14 NOTE — Progress Notes (Signed)
PROGRESS NOTE                                                                                                                                                                                                             Patient Demographics:    Erika Gates, is a 88 y.o. female, DOB - 1934/12/22, HKV:[id number]  Outpatient Primary MD for the patient is [name], MD    LOS - 5  Admit date - 04/09/2023    Chief Complaint  Patient presents with   Bradycardia   Fatigue       Brief Narrative (HPI from H&P)    -88 y.o. female with medical history significant of hypertension, hyperlipidemia, paroxysmal atrial fibrillation, CAD s/p CABG in 1996, diastolic CHF, neurologic myopathy with quadriparesis requiring cervical decompression 07/2022, diabetes mellitus type 2, and morbid obesity who presented from home after being noted to be more lethargic last night.  Workup in the ER suggestive of severe anemia, bradycardia and hypothermia, she also had mild hypercapnia, she was given packed RBC transfusion, her Eliquis was held, she was placed on PPI and BiPAP, GI was consulted and she was admitted to the hospital for further care   Subjective:   Patient denies any complaints today.   Assessment  & Plan :     Acute respiratory failure with hypoxia and hypercapnia secondary to acute on chronic diastolic congestive heart failure precipitated by severe anemia, hypercapnia worsened by poor respiratory effort due to hypoperfusion caused by severe anemia and poor respiratory effort. - Diuresed with Lasix, overnight BiPAP, mentation much improved, breathing much improved, transition to oxygen, up in chair in daytime if possible, I-S flutter valve for pulmonary toiletry, as needed Lasix, has been transfused 2 units of packed RBC with good improvement, continue to monitor. - she remains on 2 L [place], she was encouraged to use incentive spirometer.    Acute blood loss anemia secondary to GI bleed  - Stool Hemoccult positive, was on Eliquis, Eliquis on hold, IV PPI, s/p 2 units packed RBC, posttransfusion H&H stable, no frank melena or bright red blood per rectum by [facility] - family refused invasive procedures and no EGD, H&H stable post PRBCs, family initially hesitant for EGD now has agreed EGD by Dr. Dulce Sellar on 04/13/2023. -This post endoscopy 8/28 by Dr. Dulce Sellar, significant for erosive gastritis,  duodenopathy with nonbleeding duodenal ulcer, continue with Protonix 40 mg p.o. twice daily, hold Eliquis x 1 week.   Sinus bradycardia - With EMS heart rates were reported to be in the 30s, was on high-dose beta-blocker which has been held.  Heart rate improving continue to monitor.  TSH borderline suggestive of sick thyroid.  Transient hypotension  Due to combination of severe anemia and bradycardia, much improved after supportive care  UTI.  No sepsis.  Empiric Rocephin follow cultures.   Pancytopenia  Acute on chronic.  Monitor.  Packed RBCs transfused.   Paroxysmal atrial fibrillation on chronic anticoagulation  Not on rate controlling agents currently, was on Eliquis, hold due to her anemia and monitor.   Acute metabolic encephalopathy  Initial lethargy due to hypotension and hypoperfusion, due to poor respiratory effort developed some hypercapnia, CT head unremarkable, overnight BiPAP on 04/09/2023, mentation much improved, monitor with supportive care.   AKI.  Stable and resolved after transfusion, currently getting as needed Lasix  Hyperlipidemia -Continue statin   GERD  - PPI IV  Diabetes mellitus type 2, without long-term use of insulin ISS   Lab Results  Component Value Date   HGBA1C 6.1 (H) 04/10/2023   CBG (last 3)  Recent Labs    04/13/23 2127 04/14/23 0755 04/14/23 1252  GLUCAP 117* 82 100*            Condition - Extremely Guarded  Family Communication  : Daughter bedside   Code Status : Full  code  Consults  : GI, palliative care for goals of care  PUD Prophylaxis : PPI   Procedures  :     EGD due 04/13/2023.    CT abdomen pelvis.  1. No evidence of retroperitoneal hematoma. 2. Small right pleural effusion with atelectasis or infiltrate at the lung bases. 3. Anasarca. 4. Cholelithiasis. 5. Diverticulosis without diverticulitis. 6. Periumbilical hernia to the right of midline containing fat and ascites. 7. Aortic atherosclerosis and coronary artery calcifications.   Echocardiogram.  1. No significant change from echo done in NOv 2022.  2. Left ventricular ejection fraction, by estimation, is 70 to 75%. The left ventricle has hyperdynamic function. The left ventricle has no regional wall motion abnormalities. There is mild left ventricular hypertrophy. Left ventricular diastolic parameters are consistent with Grade II diastolic dysfunction (pseudonormalization). Elevated left atrial pressure.  3. Right ventricular systolic function is mildly reduced. The right ventricular size is mildly enlarged.  4. Left atrial size was mildly dilated.  5. The mitral valve is normal in structure. Mild mitral valve regurgitation.  6. The aortic valve is normal in structure. Aortic valve regurgitation is not visualized.  7. The inferior vena cava is dilated in size with <50% respiratory variability, suggesting right atrial pressure of 15 mmHg  CT head.  Nonacute.      Disposition Plan  :    Status is: Inpatient  DVT Prophylaxis  :  SCDs    Lab Results  Component Value Date   PLT 86 (L) 04/14/2023    Diet :  Diet Order             DIET DYS 3 Room service appropriate? Yes; Fluid consistency: Thin  Diet effective now                    Inpatient Medications  Scheduled Meds:  fluticasone  2 spray Each Nare Daily   insulin aspart  0-9 Units Subcutaneous TID WC   midodrine  5 mg Oral  BID WC   pantoprazole  40 mg Oral BID   Followed by   Melene Muller ON 05/09/2023] pantoprazole  40 mg  Oral Daily   rosuvastatin  20 mg Oral QPM   sodium chloride flush  3 mL Intravenous Q12H   Continuous Infusions:  sodium chloride 10 mL/hr at 04/10/23 1427   cefTRIAXone (ROCEPHIN)  IV 2 g (04/14/23 1228)   lactated ringers 10 mL/hr at 04/13/23 1112   PRN Meds:.sodium chloride, acetaminophen **OR** acetaminophen, albuterol, docusate sodium, guaiFENesin, lidocaine, naphazoline-pheniramine, polyethylene glycol, sodium chloride    Objective:   Vitals:   04/14/23 0515 04/14/23 0526 04/14/23 0800 04/14/23 1200  BP:  130/73 134/72 (!) 144/78  Pulse: 88 86 83 74  Resp: 18 18 20 20   Temp:   (!) 97.2 F (36.2 C)   TempSrc:   Oral   SpO2: 95% 97% 92% 95%  Weight:        Wt Readings from Last 3 Encounters:  04/14/23 98.6 kg  01/12/23 95.3 kg  10/27/22 95.3 kg     Intake/Output Summary (Last 24 hours) at 04/14/2023 1328 Last data filed at 04/14/2023 0525 Gross per 24 hour  Intake --  Output 1200 ml  Net -1200 ml     Physical Exam  Awake Alert, Oriented X 3, No new F.N deficits, Normal affect Symmetrical Chest wall movement, Good air movement bilaterally, CTAB RRR,No Gallops,Rubs or new Murmurs, No Parasternal Heave +ve B.Sounds, Abd Soft, No tenderness, No rebound - guarding or rigidity. No Cyanosis, Clubbing or edema, No new Rash or bruise        Data Review:    Recent Labs  Lab 04/09/23 0217 04/09/23 0450 04/11/23 0209 04/12/23 0244 04/12/23 1149 04/13/23 0116 04/14/23 0125  WBC 3.6*   < > 6.4 6.7 7.6 6.9 6.7  HGB 4.9*   < > 8.1* 7.9* 8.3* 7.8* 7.8*  HCT 20.3*   < > 29.4* 29.6* 30.4* 29.0* 29.5*  PLT 106*   < > 92* 88* 92* 87* 86*  MCV 75.7*   < > 81.4 81.3 82.4 83.6 84.0  MCH 18.3*   < > 22.4* 21.7* 22.5* 22.5* 22.2*  MCHC 24.1*   < > 27.6* 26.7* 27.3* 26.9* 26.4*  RDW 20.2*   < > 20.0* 21.2* 21.3* 22.0* 22.7*  LYMPHSABS 0.8  --  0.7 0.6*  --  0.6* 0.6*  MONOABS 0.3  --  0.7 0.9  --  0.9 1.0  EOSABS 0.0  --  0.1 0.1  --  0.1 0.2  BASOSABS 0.0  --  0.0  0.0  --  0.0 0.0   < > = values in this interval not displayed.    Recent Labs  Lab 04/09/23 0122 04/09/23 0127 04/09/23 0129 04/09/23 0425 04/09/23 0450 04/10/23 0158 04/10/23 1153 04/11/23 0209 04/12/23 0244 04/13/23 0116 04/14/23 0125  NA 139   < >  --   --    < > 138  --  139 140 139 139  K 4.2   < >  --   --    < > 4.3  --  4.1 3.7 4.5 4.5  CL 100   < >  --   --   --  102  --  101 100 97* 97*  CO2 29  --   --   --   --  27  --  32 35* 35* 36*  ANIONGAP 10  --   --   --   --  9  --  6 5 7 6   GLUCOSE 127*   < >  --   --   --  82  --  106* 96 134* 96  BUN 32*   < >  --   --   --  36*  --  31* 22 22 14   CREATININE 1.13*   < >  --   --   --  1.25*  --  1.34* 0.97 0.98 0.73  AST 32  --   --   --   --   --   --   --   --   --   --   ALT 22  --   --   --   --   --   --   --   --   --   --   ALKPHOS 82  --   --   --   --   --   --   --   --   --   --   BILITOT 0.5  --   --   --   --   --   --   --   --   --   --   ALBUMIN 3.2*  --   --   --   --   --   --   --   --   --   --   CRP  --   --   --   --   --  0.5  --  0.7 0.8 2.5* 4.1*  PROCALCITON  --   --   --   --   --   --  <0.10 <0.10 <0.10 <0.10 <0.10  LATICACIDVEN  --   --  0.8 0.5  --   --   --   --   --   --   --   TSH  --   --   --   --   --  6.567*  --  4.308  --   --   --   HGBA1C  --   --   --   --   --   --  6.1*  --   --   --   --   BNP 1,659.1*  --   --   --   --   --  715.5* 870.1* 895.5* 819.2* 1,263.0*  MG  --   --   --   --   --   --   --  2.3 2.2 2.1 2.0  CALCIUM 10.0  --   --   --   --  9.7  --  9.5 9.7 9.6 9.7   < > = values in this interval not displayed.      Recent Labs  Lab 04/09/23 0129 04/09/23 0425 04/10/23 0158 04/10/23 1153 04/11/23 0209 04/12/23 0244 04/13/23 0116 04/14/23 0125  CRP  --   --  0.5  --  0.7 0.8 2.5* 4.1*  PROCALCITON  --   --   --  <0.10 <0.10 <0.10 <0.10 <0.10  LATICACIDVEN 0.8 0.5  --   --   --   --   --   --   TSH  --   --  6.567*  --  4.308  --   --   --   HGBA1C  --    --   --  6.1*  --   --   --   --   BNP  --   --   --  715.5* 870.1* 895.5* 819.2* 1,263.0*  MG  --   --   --   --  2.3 2.2 2.1 2.0  CALCIUM  --   --  9.7  --  9.5 9.7 9.6 9.7    Recent Labs  Lab 04/09/23 0129 04/09/23 0217 04/09/23 0425 04/09/23 2332 04/10/23 0158 04/10/23 1153 04/11/23 0209 04/12/23 0244 04/12/23 1149 04/13/23 0116 04/14/23 0125  WBC  --    < >  --    < > 5.8 6.1 6.4 6.7 7.6 6.9 6.7  PLT  --    < >  --    < > 94* 97* 92* 88* 92* 87* 86*  CRP  --   --   --   --  0.5  --  0.7 0.8  --  2.5* 4.1*  PROCALCITON  --   --   --   --   --  <0.10 <0.10 <0.10  --  <0.10 <0.10  LATICACIDVEN 0.8  --  0.5  --   --   --   --   --   --   --   --   CREATININE  --   --   --   --  1.25*  --  1.34* 0.97  --  0.98 0.73   < > = values in this interval not displayed.   No results for input(s): "TSH", "T4TOTAL", "T3FREE", "THYROIDAB" in the last 72 hours.  Invalid input(s): "FREET3"     Micro Results Recent Results (from the past 240 hour(s))  SARS Coronavirus 2 by RT PCR (hospital order, performed in [facility] hospital lab) *cepheid single result test* Anterior Nasal Swab     Status: None   Collection Time: 04/09/23  1:20 AM   Specimen: Anterior Nasal Swab  Result Value Ref Range Status   SARS Coronavirus 2 by RT PCR NEGATIVE NEGATIVE Final    Comment: Performed at [facility], [address] [place], [place] [address]    Radiology Reports DG Chest [place] 1 View  Result Date: 04/11/2023 CLINICAL DATA:  Shortness of breath. EXAM: PORTABLE CHEST 1 VIEW COMPARISON:  04/10/2023 FINDINGS: Sternotomy wires unchanged. Lungs are somewhat hypoinflated with stable elevation of the right hemidiaphragm. Minimal linear scarring over the right base. Minimal prominence of the central pulmonary vessels improved compared to the previous exam and likely minimal residual vascular congestion. Mild stable cardiomegaly. No effusion or pneumothorax. Remainder of the exam is unchanged.  IMPRESSION: 1. Interval improvement with minimal residual vascular congestion. 2. Minimal linear scarring over the right base. Electronically Signed   By: Elberta Fortis M.D.   On: 04/11/2023 08:17      Signature  -   Mliss Fritz Bereket Gernert M.D on 04/14/2023 at 1:28 PM   -  To page go to [email or web address]

## 2023-04-14 NOTE — TOC Progression Note (Signed)
Transition of Care [facility]) - Progression Note    Patient Details  Name: Erika Gates MRN: [id number] Date of Birth: 1935/03/13  [facility] CM/SW Contact  Gordy Clement, RN Phone Number: 04/14/2023, 11:36 AM  Clinical Narrative:     Patient's insurance has denied CIR.  Patient will now DC to home with Home Health. Patient has had [facility] in the past and would like to use them again. Liaison has been contacted. CM has spoken with Daughter who will transport home. Patient has all needed DME in the home. No additional TOC needs     Expected Discharge Plan: Skilled Nursing Facility Barriers to Discharge: English as a second language teacher, Continued Medical Work up  Expected Discharge Plan and Services       Living arrangements for the past 2 months: Single Family Home                                       Social Determinants of Health (SDOH) Interventions SDOH Screenings   Food Insecurity: No Food Insecurity (04/09/2023)  Housing: Low Risk  (04/09/2023)  Transportation Needs: No Transportation Needs (04/09/2023)  Utilities: Not At Risk (04/09/2023)  Financial Resource Strain: Low Risk  (08/27/2022)   Received from [facility], [facility]  Tobacco Use: Low Risk  (01/12/2023)  Health Literacy: Adequate Health Literacy (08/27/2022)   Received from [facility], [facility]    Readmission Risk Interventions     No data to display

## 2023-04-14 NOTE — Progress Notes (Signed)
Inpatient Rehabilitation Admissions Coordinator   I met with patient and her daughter, [name], at bedside. I notified them that we have received a denial from [facility] after peer to peer with Dr Riley Kill and Dr Madelin Rear at [facility]. I discussed appeal rights, but [name] prefers d/c home. I will notify acute team and TOC. We will sign off.    Ottie Glazier, RN, MSN Rehab Admissions Coordinator [phone] 04/14/2023 11:10 AM

## 2023-04-14 NOTE — Progress Notes (Signed)
Physical Therapy Treatment Patient Details Name: Erika Gates MRN: [id number] DOB: [birth year] Today's Date: 04/14/2023   History of Present Illness 88 y.o. female presented to ED 04/09/23 due to lethargy. +severe anemia, bradycardia and hypothermia, acute respiratory failure requiring BiPAP; +GI bleed;   PMH significant of hypertension, hyperlipidemia, paroxysmal atrial fibrillation, CAD s/p CABG in 1996, diastolic CHF, neurologic myopathy with quadriparesis requiring cervical decompression 07/2022, diabetes mellitus type 2, and morbid obesity    PT Comments  Patient able to progress ambulation to up to 10 ft with RW and 2L O2 with sats 88% or better and max HR 154 bpm. She ambulated 3 times with seated rests between. After PT session, informed by CM that insurance denied admission to CIR and daughter has chosen to take pt home with HHPT.     If plan is discharge home, recommend the following: Assistance with cooking/housework;Assist for transportation;Help with stairs or ramp for entrance;Two people to help with walking and/or transfers   Can travel by private vehicle        Equipment Recommendations  Hospital bed (if pt remains weak (family has been considering))    Recommendations for Other Services       Precautions / Restrictions Precautions Precautions: Fall Precaution Comments: watch HR, O2 Restrictions Weight Bearing Restrictions: No     Mobility  Bed Mobility Overal bed mobility: Needs Assistance Bed Mobility: Supine to Sit     Supine to sit: Mod assist     General bed mobility comments: assist for BLE mgmt, no assist to raise trunk; scooting hips to EOB good effort on pt part.    Transfers Overall transfer level: Needs assistance Equipment used: Rolling walker (2 wheels) Transfers: Sit to/from Stand Sit to Stand: Min assist   Step pivot transfers: Min assist       General transfer comment: Min A to rise from bed and armchair in room x 2. cues for hand  placement and securing chair to stand    Ambulation/Gait Ambulation/Gait assistance: Min assist Gait Distance (Feet): 4 Feet (seated rest; 6 ft; seated rest; 10 ft) Assistive device: Rolling walker (2 wheels) Gait Pattern/deviations: Step-to pattern, Decreased stride length, Trunk flexed   Gait velocity interpretation: <1.8 ft/sec, indicate of risk for recurrent falls   General Gait Details: walker at lowest height and too tall for pt; cues for upright posture; close follow with chair (by dtr)   Stairs             Wheelchair Mobility     Tilt Bed    Modified Rankin (Stroke Patients Only)       Balance Overall balance assessment: Needs assistance Sitting-balance support: No upper extremity supported, Feet supported Sitting balance-Leahy Scale: Fair     Standing balance support: Bilateral upper extremity supported, During functional activity, Reliant on assistive device for balance Standing balance-Leahy Scale: Poor                              Cognition Arousal: Alert Behavior During Therapy: Flat affect, WFL for tasks assessed/performed Overall Cognitive Status: Impaired/Different from baseline Area of Impairment: Problem solving, Awareness                           Awareness: Emergent Problem Solving: Slow processing, Requires verbal cues, Requires tactile cues General Comments: PT AAOx4 with increased time and pleasant throughout session. Pt currently requires occasional to min cues for  sequencing during funcitonal tasks. Pt with short-term memory deficits at baseline.        Exercises General Exercises - Lower Extremity Ankle Circles/Pumps: AROM, Both, 10 reps Heel Slides: AAROM, Both, 10 reps Hip ABduction/ADduction: AAROM, Both, 10 reps    General Comments General comments (skin integrity, edema, etc.): Daughter present. On 2L with sats >=88% and HR up to 154 bpm      Pertinent Vitals/Pain Pain Assessment Pain Assessment:  No/denies pain    Home Living                          Prior Function            PT Goals (current goals can now be found in the care plan section) Acute Rehab PT Goals Patient Stated Goal: agrees with moving well enough to go home Time For Goal Achievement: 04/24/23 Potential to Achieve Goals: Good Progress towards PT goals: Progressing toward goals    Frequency    Min 1X/week      PT Plan      Co-evaluation              AM-PAC PT "6 Clicks" Mobility   Outcome Measure  Help needed turning from your back to your side while in a flat bed without using bedrails?: A Lot Help needed moving from lying on your back to sitting on the side of a flat bed without using bedrails?: A Lot Help needed moving to and from a bed to a chair (including a wheelchair)?: A Little Help needed standing up from a chair using your arms (e.g., wheelchair or bedside chair)?: A Little Help needed to walk in hospital room?: Total Help needed climbing 3-5 steps with a railing? : Total 6 Click Score: 12    End of Session Equipment Utilized During Treatment: Oxygen;Gait belt Activity Tolerance: Patient limited by fatigue Patient left: in chair;with call bell/phone within reach;with chair alarm set;with family/visitor present   PT Visit Diagnosis: Other abnormalities of gait and mobility (R26.89);Muscle weakness (generalized) (M62.81)     Time: 1610-9604 PT Time Calculation (min) (ACUTE ONLY): 32 min  Charges:    $Gait Training: 8-22 mins $Therapeutic Exercise: 8-22 mins PT General Charges $$ ACUTE PT VISIT: 1 Visit                      Jerolyn Center, PT Acute Rehabilitation Services  Office [phone]    Zena Amos 04/14/2023, 11:22 AM

## 2023-04-14 NOTE — Anesthesia Postprocedure Evaluation (Signed)
Anesthesia Post Note  Patient: Erika Gates  Procedure(s) Performed: ESOPHAGOGASTRODUODENOSCOPY (EGD) WITH PROPOFOL (Left)     Patient location during evaluation: PACU Anesthesia Type: MAC Level of consciousness: awake and alert Pain management: pain level controlled Vital Signs Assessment: post-procedure vital signs reviewed and stable Respiratory status: spontaneous breathing, nonlabored ventilation, respiratory function stable and patient connected to nasal cannula oxygen Cardiovascular status: stable and blood pressure returned to baseline Postop Assessment: no apparent nausea or vomiting Anesthetic complications: no   No notable events documented.  Last Vitals:  Vitals:   04/14/23 0515 04/14/23 0526  BP:  130/73  Pulse: 88 86  Resp: 18 18  Temp:    SpO2: 95% 97%    Last Pain:  Vitals:   04/14/23 0446  TempSrc: Oral  PainSc:                  Erika Gates

## 2023-04-14 NOTE — Progress Notes (Signed)
   Medical records reviewed including progress notes, labs, imaging.  EGD results and insurance denial of CIR noted.  Plan is for discharge home when medically ready.  Goals of care at this time are clear for full code/full scope treatment.  Patient and family have been encouraged to reach out to PMT with additional needs.  PMT will continue to follow peripherally. Thank you for your referral and allowing PMT to assist in Erika Gates's care.    Richardson Dopp, [facility] Palliative Medicine Team  Team Phone # [phone]   NO CHARGE

## 2023-04-15 DIAGNOSIS — J9602 Acute respiratory failure with hypercapnia: Secondary | ICD-10-CM | POA: Diagnosis not present

## 2023-04-15 DIAGNOSIS — D62 Acute posthemorrhagic anemia: Secondary | ICD-10-CM | POA: Diagnosis not present

## 2023-04-15 DIAGNOSIS — J9601 Acute respiratory failure with hypoxia: Secondary | ICD-10-CM | POA: Diagnosis not present

## 2023-04-15 DIAGNOSIS — K264 Chronic or unspecified duodenal ulcer with hemorrhage: Secondary | ICD-10-CM | POA: Diagnosis not present

## 2023-04-15 LAB — CBC WITH DIFFERENTIAL/PLATELET
Abs Immature Granulocytes: 0.02 10*3/uL (ref 0.00–0.07)
Basophils Absolute: 0 10*3/uL (ref 0.0–0.1)
Basophils Relative: 1 %
Eosinophils Absolute: 0.2 10*3/uL (ref 0.0–0.5)
Eosinophils Relative: 3 %
HCT: 27.8 % — ABNORMAL LOW (ref 36.0–46.0)
Hemoglobin: 7.5 g/dL — ABNORMAL LOW (ref 12.0–15.0)
Immature Granulocytes: 0 %
Lymphocytes Relative: 7 %
Lymphs Abs: 0.5 10*3/uL — ABNORMAL LOW (ref 0.7–4.0)
MCH: 22.7 pg — ABNORMAL LOW (ref 26.0–34.0)
MCHC: 27 g/dL — ABNORMAL LOW (ref 30.0–36.0)
MCV: 84.2 fL (ref 80.0–100.0)
Monocytes Absolute: 0.9 10*3/uL (ref 0.1–1.0)
Monocytes Relative: 12 %
Neutro Abs: 5.5 10*3/uL (ref 1.7–7.7)
Neutrophils Relative %: 77 %
Platelets: 84 10*3/uL — ABNORMAL LOW (ref 150–400)
RBC: 3.3 MIL/uL — ABNORMAL LOW (ref 3.87–5.11)
RDW: 22.6 % — ABNORMAL HIGH (ref 11.5–15.5)
WBC: 7.1 10*3/uL (ref 4.0–10.5)
nRBC: 0 % (ref 0.0–0.2)

## 2023-04-15 LAB — BASIC METABOLIC PANEL
Anion gap: 7 (ref 5–15)
BUN: 12 mg/dL (ref 8–23)
CO2: 35 mmol/L — ABNORMAL HIGH (ref 22–32)
Calcium: 9.8 mg/dL (ref 8.9–10.3)
Chloride: 97 mmol/L — ABNORMAL LOW (ref 98–111)
Creatinine, Ser: 0.78 mg/dL (ref 0.44–1.00)
GFR, Estimated: >60 mL/min (ref >60)
Glucose, Bld: 89 mg/dL (ref 70–99)
Potassium: 4.2 mmol/L (ref 3.5–5.1)
Sodium: 139 mmol/L (ref 135–145)

## 2023-04-15 LAB — PREPARE RBC (CROSSMATCH)

## 2023-04-15 LAB — GLUCOSE, CAPILLARY
Glucose-Capillary: 111 mg/dL — ABNORMAL HIGH (ref 70–99)
Glucose-Capillary: 92 mg/dL (ref 70–99)
Glucose-Capillary: 117 mg/dL — ABNORMAL HIGH (ref 70–99)
Glucose-Capillary: 123 mg/dL — ABNORMAL HIGH (ref 70–99)

## 2023-04-15 LAB — PROCALCITONIN: Procalcitonin: <0.1 ng/mL

## 2023-04-15 MED ORDER — VITAMIN B-12 1000 MCG PO TABS
500.0000 ug | ORAL_TABLET | Freq: Every day | ORAL | Status: DC
  Administered 2023-04-15 – 2023-04-22 (×8): 500 ug via ORAL
  Filled 2023-04-15 (×8): qty 1

## 2023-04-15 MED ORDER — SODIUM CHLORIDE 0.9 % IV SOLN
250.0000 mg | Freq: Every day | INTRAVENOUS | Status: AC
  Administered 2023-04-15 – 2023-04-16 (×2): 250 mg via INTRAVENOUS
  Filled 2023-04-15 (×2): qty 20

## 2023-04-15 MED ORDER — SODIUM CHLORIDE 0.9% IV SOLUTION: Freq: Once | INTRAVENOUS | Status: AC

## 2023-04-15 NOTE — Progress Notes (Signed)
Speech Language Pathology Treatment: Dysphagia  Patient Details Name: Erika Gates MRN: [id number] DOB: 12/21/1934 Today's Date: 04/15/2023 Time: 1213-1222 SLP Time Calculation (min) (ACUTE ONLY): 9 min  Assessment / Plan / Recommendation Clinical Impression  Pt seen for further assessment of swallowing. She reports still experiencing intermittent coughing throughout the day, but states that it does not appear to be directly related to swallowing. Observed with trials of thin liquids and solids with no overt s/s of dysphagia or aspiration. Pt independently initiating small controlled sips via straw, which likely positively affects her ability to manage POs. Recommend continuing diet of Dys 3 textures with thin liquids. No further SLP f/u is needed at this time. Will s/o.    HPI HPI: Erika Gates is an 88 yo female presenting to ED 8/16 with AMS. CXR 8/17 with bibasilar atelectasis or infiltrate and vascular congestion without overt pulmonary edema. CTH unremarkable. Found to have UTI, AKI, acute respiratory failure requiring BiPAP 8/16. Seen by SLP December 2023 s/p ACDF with dysphagia, although was upgraded to regular texture diet with thin liquids. EGD completed 8/20 with findings significant for a small hiatal hernia, patchy mildly erythematous mucosa without active bleeding, one non-bleeding superficial duodenal ulcer.  PMH includes HTN, HLD, paroxysmal A-fib, CAD s/p CABG 1996, diastolic CHF, neurologic myopathy with quadriparesis requiring cervical decompression 2023, T2DM, morbid obesity      SLP Plan  All goals met      Recommendations for follow up therapy are one component of a multi-disciplinary discharge planning process, led by the attending physician.  Recommendations may be updated based on patient status, additional functional criteria and insurance authorization.    Recommendations  Diet recommendations: Dysphagia 3 (mechanical soft);Thin liquid Liquids provided via:  Cup;Straw Medication Administration: Whole meds with puree Supervision: Patient able to self feed;Intermittent supervision to cue for compensatory strategies Compensations: Minimize environmental distractions;Slow rate;Small sips/bites Postural Changes and/or Swallow Maneuvers: Seated upright 90 degrees;Upright 30-60 min after meal                  Oral care BID   PRN Dysphagia, unspecified (R13.10)     All goals met     Gwynneth Aliment, M.A., CF-SLP Speech Language Pathology, Acute Rehabilitation Services  Secure Chat preferred [phone]   04/15/2023, 12:30 PM

## 2023-04-15 NOTE — Progress Notes (Signed)
   Medical records reviewed including progress notes, labs, imaging. Family is hopeful for improvement and plan is for discharge home when medically ready.  Goals of care at this time are clear for full code/full scope treatment.  Patient and family have been encouraged to reach out to PMT with additional needs.  PMT will sign off at this time. Please re-consult if acute palliative needs arise.   Richardson Dopp, [facility] Palliative Medicine Team  Team Phone # [phone]   NO CHARGE

## 2023-04-15 NOTE — Progress Notes (Signed)
PROGRESS NOTE                                                                                                                                                                                                             Patient Demographics:    Erika Gates, is a 88 y.o. female, DOB - [birth year], ZOX:[id number]  Outpatient Primary MD for the patient is [name], MD    LOS - 6  Admit date - 04/09/2023    Chief Complaint  Patient presents with   Bradycardia   Fatigue       Brief Narrative (HPI from H&P)     -88 y.o. female with medical history significant of hypertension, hyperlipidemia, paroxysmal atrial fibrillation, CAD s/p CABG in 1996, diastolic CHF, neurologic myopathy with quadriparesis requiring cervical decompression 07/2022, diabetes mellitus type 2, and morbid obesity who presented from home after being noted to be more lethargic last night.  Workup in the ER suggestive of severe anemia, bradycardia and hypothermia, she also had mild hypercapnia, she was given packed RBC transfusion, her Eliquis was held, she was placed on PPI and BiPAP, GI was consulted and she was admitted to the hospital for further care   Subjective:   Patient denies any complaints today.  Reports BM yesterday , was dark in color.   Assessment  & Plan :     Acute respiratory failure with hypoxia and hypercapnia secondary to acute on chronic diastolic congestive heart failure precipitated by severe anemia, hypercapnia worsened by poor respiratory effort due to hypoperfusion caused by severe anemia and poor respiratory effort. - Diuresed with Lasix, overnight BiPAP, mentation much improved, breathing much improved, transition to oxygen, up in chair in daytime if possible, I-S flutter valve for pulmonary toiletry, as needed Lasix, has been transfused 2 units of packed RBC with good improvement, continue to monitor. - she remains on 2 L [place], she  was encouraged to use incentive spirometer.   Acute blood loss anemia secondary to GI bleed  - Stool Hemoccult positive, was on Eliquis, Eliquis on hold, IV PPI, s/p 2 units packed RBC, posttransfusion H&H stable, no frank melena or bright red blood per rectum by [facility] - family refused invasive procedures and no EGD, H&H stable post PRBCs, family initially hesitant for EGD now has agreed EGD by Dr. Dulce Sellar on 04/13/2023. -This  post endoscopy 8/28 by Dr. Dulce Sellar, significant for erosive gastritis, duodenopathy with nonbleeding duodenal ulcer, continue with Protonix 40 mg p.o. twice daily, hold Eliquis x 1 week. -Hemoglobin 7.5 this morning, will give another unit PRBC, given her frailty, and she needs to go back on Eliquis and less monitored environment in the outpatient setting next week -Continue with IV iron, to receive x 2 days   Sinus bradycardia - With EMS heart rates were reported to be in the 30s, was on high-dose beta-blocker which has been held.  Heart rate improving continue to monitor.  TSH borderline suggestive of sick thyroid.  Transient hypotension  Due to combination of severe anemia and bradycardia, much improved after supportive care  UTI.  No sepsis.  Empiric Rocephin follow cultures.   Pancytopenia  Acute on chronic.  Monitor.  Packed RBCs transfused.   Paroxysmal atrial fibrillation on chronic anticoagulation  Not on rate controlling agents currently, was on Eliquis, hold due to her anemia and monitor.   Acute metabolic encephalopathy  Initial lethargy due to hypotension and hypoperfusion, due to poor respiratory effort developed some hypercapnia, CT head unremarkable, overnight BiPAP on 04/09/2023, mentation much improved, monitor with supportive care.   AKI.  Stable and resolved after transfusion, currently getting as needed Lasix  Hyperlipidemia -Continue statin   GERD  - PPI IV  Diabetes mellitus type 2, without long-term use of insulin ISS   Lab Results   Component Value Date   HGBA1C 6.1 (H) 04/10/2023   CBG (last 3)  Recent Labs    04/14/23 2040 04/15/23 0853 04/15/23 1151  GLUCAP 103* 111* 92            Condition - Extremely Guarded  Family Communication  : Daughter bedside   Code Status : Full code  Consults  : GI, palliative care for goals of care  PUD Prophylaxis : PPI   Procedures  :     EGD due 04/13/2023.    CT abdomen pelvis.  1. No evidence of retroperitoneal hematoma. 2. Small right pleural effusion with atelectasis or infiltrate at the lung bases. 3. Anasarca. 4. Cholelithiasis. 5. Diverticulosis without diverticulitis. 6. Periumbilical hernia to the right of midline containing fat and ascites. 7. Aortic atherosclerosis and coronary artery calcifications.   Echocardiogram.  1. No significant change from echo done in NOv 2022.  2. Left ventricular ejection fraction, by estimation, is 70 to 75%. The left ventricle has hyperdynamic function. The left ventricle has no regional wall motion abnormalities. There is mild left ventricular hypertrophy. Left ventricular diastolic parameters are consistent with Grade II diastolic dysfunction (pseudonormalization). Elevated left atrial pressure.  3. Right ventricular systolic function is mildly reduced. The right ventricular size is mildly enlarged.  4. Left atrial size was mildly dilated.  5. The mitral valve is normal in structure. Mild mitral valve regurgitation.  6. The aortic valve is normal in structure. Aortic valve regurgitation is not visualized.  7. The inferior vena cava is dilated in size with <50% respiratory variability, suggesting right atrial pressure of 15 mmHg  CT head.  Nonacute.      Disposition Plan  :    Status is: Inpatient  DVT Prophylaxis  :  SCDs    Lab Results  Component Value Date   PLT 84 (L) 04/15/2023    Diet :  Diet Order             DIET DYS 3 Room service appropriate? Yes; Fluid consistency: Thin  Diet effective now  Inpatient Medications  Scheduled Meds:  sodium chloride   Intravenous Once   vitamin B-12  500 mcg Oral Daily   fluticasone  2 spray Each Nare Daily   insulin aspart  0-9 Units Subcutaneous TID WC   pantoprazole  40 mg Oral BID   Followed by   Melene Muller ON 05/09/2023] pantoprazole  40 mg Oral Daily   rosuvastatin  20 mg Oral QPM   sodium chloride flush  3 mL Intravenous Q12H   Continuous Infusions:  sodium chloride 10 mL/hr at 04/10/23 1427   cefTRIAXone (ROCEPHIN)  IV 2 g (04/15/23 1230)   ferric gluconate (FERRLECIT) IVPB 250 mg (04/15/23 1026)   lactated ringers 10 mL/hr at 04/13/23 1112   PRN Meds:.sodium chloride, acetaminophen **OR** acetaminophen, albuterol, docusate sodium, guaiFENesin, lidocaine, naphazoline-pheniramine, polyethylene glycol, sodium chloride    Objective:   Vitals:   04/15/23 0523 04/15/23 0854 04/15/23 0855 04/15/23 0856  BP: (!) 148/64   134/63  Pulse: 91   (!) 105  Resp: (!) 25   (!) 26  Temp: (!) 97.4 F (36.3 C)   98.4 F (36.9 C)  TempSrc: Oral Oral Oral Oral  SpO2: 91%   92%  Weight:        Wt Readings from Last 3 Encounters:  04/15/23 99.8 kg  01/12/23 95.3 kg  10/27/22 95.3 kg     Intake/Output Summary (Last 24 hours) at 04/15/2023 1353 Last data filed at 04/15/2023 0524 Gross per 24 hour  Intake 240 ml  Output 1500 ml  Net -1260 ml     Physical Exam  Awake Alert, Oriented X 3, No new F.N deficits, Normal affect Symmetrical Chest wall movement, Good air movement bilaterally, CTAB RRR,No Gallops,Rubs or new Murmurs, No Parasternal Heave +ve B.Sounds, Abd Soft, No tenderness, No rebound - guarding or rigidity. No Cyanosis, Clubbing or edema, No new Rash or bruise        Data Review:    Recent Labs  Lab 04/11/23 0209 04/12/23 0244 04/12/23 1149 04/13/23 0116 04/14/23 0125 04/15/23 0310  WBC 6.4 6.7 7.6 6.9 6.7 7.1  HGB 8.1* 7.9* 8.3* 7.8* 7.8* 7.5*  HCT 29.4* 29.6* 30.4* 29.0* 29.5* 27.8*  PLT 92*  88* 92* 87* 86* 84*  MCV 81.4 81.3 82.4 83.6 84.0 84.2  MCH 22.4* 21.7* 22.5* 22.5* 22.2* 22.7*  MCHC 27.6* 26.7* 27.3* 26.9* 26.4* 27.0*  RDW 20.0* 21.2* 21.3* 22.0* 22.7* 22.6*  LYMPHSABS 0.7 0.6*  --  0.6* 0.6* 0.5*  MONOABS 0.7 0.9  --  0.9 1.0 0.9  EOSABS 0.1 0.1  --  0.1 0.2 0.2  BASOSABS 0.0 0.0  --  0.0 0.0 0.0    Recent Labs  Lab 04/09/23 0122 04/09/23 0122 04/09/23 0127 04/09/23 0129 04/09/23 0425 04/09/23 0450 04/10/23 0158 04/10/23 1153 04/11/23 0209 04/12/23 0244 04/13/23 0116 04/14/23 0125 04/15/23 0310  NA 139  --    < >  --   --    < > 138  --  139 140 139 139 139  K 4.2  --    < >  --   --    < > 4.3  --  4.1 3.7 4.5 4.5 4.2  CL 100  --    < >  --   --   --  102  --  101 100 97* 97* 97*  CO2 29  --   --   --   --   --  27  --  32 35* 35* 36*  35*  ANIONGAP 10  --   --   --   --   --  9  --  6 5 7 6 7   GLUCOSE 127*  --    < >  --   --   --  82  --  106* 96 134* 96 89  BUN 32*  --    < >  --   --   --  36*  --  31* 22 22 14 12   CREATININE 1.13*  --    < >  --   --   --  1.25*  --  1.34* 0.97 0.98 0.73 0.78  AST 32  --   --   --   --   --   --   --   --   --   --   --   --   ALT 22  --   --   --   --   --   --   --   --   --   --   --   --   ALKPHOS 82  --   --   --   --   --   --   --   --   --   --   --   --   BILITOT 0.5  --   --   --   --   --   --   --   --   --   --   --   --   ALBUMIN 3.2*  --   --   --   --   --   --   --   --   --   --   --   --   CRP  --   --   --   --   --   --  0.5  --  0.7 0.8 2.5* 4.1*  --   PROCALCITON  --    < >  --   --   --   --   --  <0.10 <0.10 <0.10 <0.10 <0.10 <0.10  LATICACIDVEN  --   --   --  0.8 0.5  --   --   --   --   --   --   --   --   TSH  --   --   --   --   --   --  6.567*  --  4.308  --   --   --   --   HGBA1C  --   --   --   --   --   --   --  6.1*  --   --   --   --   --   BNP 1,659.1*  --   --   --   --   --   --  715.5* 870.1* 895.5* 819.2* 1,263.0*  --   MG  --   --   --   --   --   --   --   --  2.3 2.2  2.1 2.0  --   CALCIUM 10.0  --   --   --   --   --  9.7  --  9.5 9.7 9.6 9.7 9.8   < > = values in this interval not displayed.      Recent Labs  Lab 04/09/23 0122 04/09/23 0129 04/09/23 0425 04/10/23 0158 04/10/23  1153 04/11/23 0209 04/12/23 0244 04/13/23 0116 04/14/23 0125 04/15/23 0310  CRP  --   --   --  0.5  --  0.7 0.8 2.5* 4.1*  --   PROCALCITON   < >  --   --   --  <0.10 <0.10 <0.10 <0.10 <0.10 <0.10  LATICACIDVEN  --  0.8 0.5  --   --   --   --   --   --   --   TSH  --   --   --  6.567*  --  4.308  --   --   --   --   HGBA1C  --   --   --   --  6.1*  --   --   --   --   --   BNP  --   --   --   --  715.5* 870.1* 895.5* 819.2* 1,263.0*  --   MG  --   --   --   --   --  2.3 2.2 2.1 2.0  --   CALCIUM  --   --   --  9.7  --  9.5 9.7 9.6 9.7 9.8   < > = values in this interval not displayed.    Recent Labs  Lab 04/09/23 0129 04/09/23 0217 04/09/23 0425 04/09/23 2332 04/10/23 0158 04/10/23 1153 04/11/23 0209 04/12/23 0244 04/12/23 1149 04/13/23 0116 04/14/23 0125 04/15/23 0310  WBC  --    < >  --    < > 5.8   < > 6.4 6.7 7.6 6.9 6.7 7.1  PLT  --    < >  --    < > 94*   < > 92* 88* 92* 87* 86* 84*  CRP  --   --   --   --  0.5  --  0.7 0.8  --  2.5* 4.1*  --   PROCALCITON  --   --   --   --   --    < > <0.10 <0.10  --  <0.10 <0.10 <0.10  LATICACIDVEN 0.8  --  0.5  --   --   --   --   --   --   --   --   --   CREATININE  --   --   --   --  1.25*  --  1.34* 0.97  --  0.98 0.73 0.78   < > = values in this interval not displayed.   No results for input(s): "TSH", "T4TOTAL", "T3FREE", "THYROIDAB" in the last 72 hours.  Invalid input(s): "FREET3"     Micro Results Recent Results (from the past 240 hour(s))  SARS Coronavirus 2 by RT PCR (hospital order, performed in [facility] hospital lab) *cepheid single result test* Anterior Nasal Swab     Status: None   Collection Time: 04/09/23  1:20 AM   Specimen: Anterior Nasal Swab  Result Value Ref Range Status    SARS Coronavirus 2 by RT PCR NEGATIVE NEGATIVE Final    Comment: Performed at [facility], [address] [place], [place] [address]    Radiology Reports No results found.    Signature  -   Huey Bienenstock M.D on 04/15/2023 at 1:53 PM   -  To page go to [email or web address]

## 2023-04-15 NOTE — Progress Notes (Signed)
PT Cancellation Note  Patient Details Name: DELANIE WAGG MRN: [id number] DOB: 1934-12-21   Cancelled Treatment:    Reason Eval/Treat Not Completed: Patient at procedure or test/unavailable  Blood products have been running <15 minutes. Discussed ?need for hospital bed at home and daughter, [name], still undecided. Anticipates discharge home tomorrow. Will notify [facility] team that she may want hospital bed.    Jerolyn Center, PT Acute Rehabilitation Services  Office [phone]  Zena Amos 04/15/2023, 3:22 PM

## 2023-04-16 ENCOUNTER — Inpatient Hospital Stay (HOSPITAL_COMMUNITY): Payer: Medicare PPO

## 2023-04-16 DIAGNOSIS — J9602 Acute respiratory failure with hypercapnia: Secondary | ICD-10-CM | POA: Diagnosis not present

## 2023-04-16 DIAGNOSIS — I5033 Acute on chronic diastolic (congestive) heart failure: Secondary | ICD-10-CM | POA: Diagnosis not present

## 2023-04-16 DIAGNOSIS — J9601 Acute respiratory failure with hypoxia: Secondary | ICD-10-CM | POA: Diagnosis not present

## 2023-04-16 LAB — GLUCOSE, CAPILLARY
Glucose-Capillary: 85 mg/dL (ref 70–99)
Glucose-Capillary: 92 mg/dL (ref 70–99)
Glucose-Capillary: 125 mg/dL — ABNORMAL HIGH (ref 70–99)
Glucose-Capillary: 144 mg/dL — ABNORMAL HIGH (ref 70–99)

## 2023-04-16 LAB — BASIC METABOLIC PANEL
Anion gap: 7 (ref 5–15)
BUN: 13 mg/dL (ref 8–23)
CO2: 37 mmol/L — ABNORMAL HIGH (ref 22–32)
Calcium: 9.9 mg/dL (ref 8.9–10.3)
Chloride: 97 mmol/L — ABNORMAL LOW (ref 98–111)
Creatinine, Ser: 0.77 mg/dL (ref 0.44–1.00)
GFR, Estimated: >60 mL/min (ref >60)
Glucose, Bld: 111 mg/dL — ABNORMAL HIGH (ref 70–99)
Potassium: 3.7 mmol/L (ref 3.5–5.1)
Sodium: 141 mmol/L (ref 135–145)

## 2023-04-16 LAB — TYPE AND SCREEN
ABO/RH(D): O NEG
Antibody Screen: NEGATIVE
Unit division: 0

## 2023-04-16 LAB — BPAM RBC
Blood Product Expiration Date: 202408272359
ISSUE DATE / TIME: 202408221432
Unit Type and Rh: 9500

## 2023-04-16 LAB — HEMOGLOBIN AND HEMATOCRIT, BLOOD
HCT: 29.3 % — ABNORMAL LOW (ref 36.0–46.0)
HCT: 31.5 % — ABNORMAL LOW (ref 36.0–46.0)
Hemoglobin: 8 g/dL — ABNORMAL LOW (ref 12.0–15.0)
Hemoglobin: 8.5 g/dL — ABNORMAL LOW (ref 12.0–15.0)

## 2023-04-16 MED ORDER — FUROSEMIDE 10 MG/ML IJ SOLN
40.0000 mg | Freq: Once | INTRAMUSCULAR | Status: AC
  Administered 2023-04-16: 40 mg via INTRAVENOUS
  Filled 2023-04-16: qty 4

## 2023-04-16 NOTE — Progress Notes (Signed)
Physical Therapy Treatment Patient Details Name: Erika Gates MRN: [id number] DOB: April 04, 1935 Today's Date: 04/16/2023   History of Present Illness 88 y.o. female presented to ED 04/09/23 due to lethargy. +severe anemia, bradycardia and hypothermia, acute respiratory failure requiring BiPAP; +GI bleed;   PMH significant of hypertension, hyperlipidemia, paroxysmal atrial fibrillation, CAD s/p CABG in 1996, diastolic CHF, neurologic myopathy with quadriparesis requiring cervical decompression 07/2022, diabetes mellitus type 2, and morbid obesity    PT Comments  Pt seen for PT tx with friends present for session, pt pleasant & agreeable. Pt on 3L/min via nasal cannula throughout session. Pt is able to transfer STS with min assist & attempts to take 1-2 steps with RW but HR quickly increased to max 170 bpm & PT instructed pt to sit down. Pt then engages in BLE strengthening exercises with PRN cuing for technique. Notified nurse of pt's elevated HR with standing attempts.    If plan is discharge home, recommend the following: Assistance with cooking/housework;Assist for transportation;Help with stairs or ramp for entrance;Two people to help with walking and/or transfers   Can travel by private vehicle        Equipment Recommendations  Hospital bed    Recommendations for Other Services       Precautions / Restrictions Precautions Precautions: Fall Precaution Comments: watch HR, O2 Restrictions Weight Bearing Restrictions: No     Mobility  Bed Mobility               General bed mobility comments: not tested, pt received & left sitting in recliner    Transfers Overall transfer level: Needs assistance Equipment used: Rolling walker (2 wheels) Transfers: Sit to/from Stand Sit to Stand: Min assist           General transfer comment: extra time to power up to standing, assistance for anterior weight shifting    Ambulation/Gait Ambulation/Gait assistance:  (Pt initiates  taking 1-2 steps with RW & min assist but HR increased & PT terminated gait attempts 2/2 elevated HR)                 Stairs             Wheelchair Mobility     Tilt Bed    Modified Rankin (Stroke Patients Only)       Balance Overall balance assessment: Needs assistance Sitting-balance support: No upper extremity supported, Feet supported Sitting balance-Leahy Scale: Fair     Standing balance support: Bilateral upper extremity supported, During functional activity, Reliant on assistive device for balance Standing balance-Leahy Scale: Poor                              Cognition Arousal: Alert Behavior During Therapy: WFL for tasks assessed/performed                                   General Comments: Pt follows simple instructions with extra time during session, pleasant & agreeable to participate.        Exercises General Exercises - Lower Extremity Long Arc Quad: AROM, Seated, Strengthening, Both, 10 reps Hip ABduction/ADduction: Strengthening, Seated, Both, 10 reps (hip adduction pillow squeezes) Hip Flexion/Marching: Strengthening, Seated, Both, 10 reps    General Comments General comments (skin integrity, edema, etc.): Pt on 3L/min via nasal cannula, max HR 170 bpm - nurse made aware. Pt with c/o slight SOB with  standing/stepping attempts but this resolves with seated rest & PT educating pt on pursed lip breathing.      Pertinent Vitals/Pain Pain Assessment Pain Assessment: Faces Faces Pain Scale: Hurts whole lot Pain Location: L knee, chronic pain Pain Descriptors / Indicators: Grimacing, Guarding, Discomfort Pain Intervention(s): Monitored during session, Repositioned, Limited activity within patient's tolerance    Home Living                          Prior Function            PT Goals (current goals can now be found in the care plan section) Acute Rehab PT Goals Patient Stated Goal: agrees with  moving well enough to go home PT Goal Formulation: With patient/family Time For Goal Achievement: 04/24/23 Potential to Achieve Goals: Good Progress towards PT goals: Progressing toward goals    Frequency    Min 1X/week      PT Plan      Co-evaluation              AM-PAC PT "6 Clicks" Mobility   Outcome Measure  Help needed turning from your back to your side while in a flat bed without using bedrails?: A Lot Help needed moving from lying on your back to sitting on the side of a flat bed without using bedrails?: A Lot Help needed moving to and from a bed to a chair (including a wheelchair)?: A Little Help needed standing up from a chair using your arms (e.g., wheelchair or bedside chair)?: A Little Help needed to walk in hospital room?: Total Help needed climbing 3-5 steps with a railing? : Total 6 Click Score: 12    End of Session Equipment Utilized During Treatment: Oxygen Activity Tolerance: Treatment limited secondary to medical complications (Comment) (limited 2/2 HR) Patient left: in chair;with call bell/phone within reach;with family/visitor present Nurse Communication: Mobility status (HR) PT Visit Diagnosis: Other abnormalities of gait and mobility (R26.89);Muscle weakness (generalized) (M62.81);Difficulty in walking, not elsewhere classified (R26.2)     Time: 1610-9604 PT Time Calculation (min) (ACUTE ONLY): 12 min  Charges:    $Therapeutic Activity: 8-22 mins PT General Charges $$ ACUTE PT VISIT: 1 Visit                     Aleda Grana, PT, DPT 04/16/23, 2:35 PM    [name] 04/16/2023, 2:34 PM

## 2023-04-16 NOTE — Progress Notes (Signed)
Occupational Therapy Treatment Patient Details Name: Erika Gates MRN: [id number] DOB: 07/30/1935 Today's Date: 04/16/2023   History of present illness 88 y.o. female presented to ED 04/09/23 due to lethargy. +severe anemia, bradycardia and hypothermia, acute respiratory failure requiring BiPAP; +GI bleed;   PMH significant of hypertension, hyperlipidemia, paroxysmal atrial fibrillation, CAD s/p CABG in 1996, diastolic CHF, neurologic myopathy with quadriparesis requiring cervical decompression 07/2022, diabetes mellitus type 2, and morbid obesity   OT comments  Pt with gradual progression towards goals, reports increased discomfort and stiffness from lack of OOB activity yesterday. Overall, pt requires Mod A for bed mobility out of soiled bed, Min A for pivot to chair with RW. Educated on AE for LB ADLs with continued extensive assist needed. Daughter present and hands on to assist, denies any concerns caring for pt at home.  HR up to 154bpm with activity       If plan is discharge home, recommend the following:  A lot of help with walking and/or transfers;A lot of help with bathing/dressing/bathroom;Assistance with cooking/housework   Equipment Recommendations  None recommended by OT    Recommendations for Other Services      Precautions / Restrictions Precautions Precautions: Fall Precaution Comments: watch HR, O2 Restrictions Weight Bearing Restrictions: No       Mobility Bed Mobility Overal bed mobility: Needs Assistance Bed Mobility: Supine to Sit     Supine to sit: Mod assist     General bed mobility comments: assist for BLE mgmt, no assist to raise trunk; scooting hips to EOB good effort on pt part.    Transfers Overall transfer level: Needs assistance Equipment used: Rolling walker (2 wheels) Transfers: Sit to/from Stand, Bed to chair/wheelchair/BSC Sit to Stand: Min assist     Step pivot transfers: Min assist           Balance Overall balance  assessment: Needs assistance Sitting-balance support: No upper extremity supported, Feet supported Sitting balance-Leahy Scale: Fair     Standing balance support: Bilateral upper extremity supported, During functional activity, Reliant on assistive device for balance Standing balance-Leahy Scale: Poor                             ADL either performed or assessed with clinical judgement   ADL Overall ADL's : Needs assistance/impaired             Lower Body Bathing: Maximal assistance;Sit to/from stand Lower Body Bathing Details (indicate cue type and reason): assist for posterior hygiene after noting purewick canister full and backflowed onto bed.     Lower Body Dressing: Maximal assistance;Sit to/from stand;Sitting/lateral leans;With adaptive equipment Lower Body Dressing Details (indicate cue type and reason): educated on various AE for LB dressing with pt difficulty grasping devices and difficulty sequencing use of sock aide.               General ADL Comments: Focus on AE education for LB ADLs, bathing after bed linen soiled    Extremity/Trunk Assessment Upper Extremity Assessment Upper Extremity Assessment: Generalized weakness;RUE deficits/detail;LUE deficits/detail RUE Deficits / Details: Generalized weakness, decreased fine and gross motor coordinaiton. Impaired shoulder flexion at baseline. However, pt and daughter state current AROM shoulder flexion is decreased from baseline. Pt currently demonstrates AROM to approx. 90 degrees. RUE Coordination: decreased fine motor;decreased gross motor LUE Deficits / Details: Generalized weakness, decreased fine and gross motor coordinaiton. Impaired shoulder flexion at baseline. However, pt and daughter state current  AROM shoulder flexion is decreased from baseline. Pt currently demonstrates AROM to approx. 80 degrees. LUE Coordination: decreased fine motor;decreased gross motor   Lower Extremity Assessment Lower  Extremity Assessment: Defer to PT evaluation        Vision   Vision Assessment?: No apparent visual deficits   Perception     Praxis      Cognition Arousal: Alert Behavior During Therapy: WFL for tasks assessed/performed Overall Cognitive Status: Impaired/Different from baseline Area of Impairment: Problem solving, Awareness                           Awareness: Emergent Problem Solving: Slow processing, Requires verbal cues General Comments: Pt AAOx4 with increased time and pleasant throughout session. Pt currently requires occasional to min cues for sequencing during funcitonal tasks. Pt with short-term memory deficits at baseline.        Exercises      Shoulder Instructions       General Comments      Pertinent Vitals/ Pain       Pain Assessment Pain Assessment: Faces Faces Pain Scale: Hurts even more Pain Location: L knee Pain Descriptors / Indicators: Grimacing, Guarding Pain Intervention(s): Monitored during session  Home Living                                          Prior Functioning/Environment              Frequency  Min 1X/week        Progress Toward Goals  OT Goals(current goals can now be found in the care plan section)  Progress towards OT goals: OT to reassess next treatment  Acute Rehab OT Goals Patient Stated Goal: home soon OT Goal Formulation: With patient/family Time For Goal Achievement: 04/24/23 Potential to Achieve Goals: Good ADL Goals Pt Will Perform Grooming: with set-up;sitting Pt Will Perform Lower Body Bathing: with adaptive equipment;sitting/lateral leans;sit to/from stand;with mod assist Pt Will Perform Lower Body Dressing: with adaptive equipment;sitting/lateral leans;sit to/from stand;with min assist Pt Will Transfer to Toilet: with supervision;ambulating;bedside commode Pt Will Perform Toileting - Clothing Manipulation and hygiene: sit to/from stand;with mod assist Pt/caregiver  will Perform Home Exercise Program: Increased ROM;Increased strength;Both right and left upper extremity;With theraband;With theraputty;With Supervision;With written HEP provided  Plan      Co-evaluation                 AM-PAC OT "6 Clicks" Daily Activity     Outcome Measure   Help from another person eating meals?: None Help from another person taking care of personal grooming?: A Little Help from another person toileting, which includes using toliet, bedpan, or urinal?: A Lot Help from another person bathing (including washing, rinsing, drying)?: A Lot Help from another person to put on and taking off regular upper body clothing?: A Little Help from another person to put on and taking off regular lower body clothing?: A Lot 6 Click Score: 16    End of Session Equipment Utilized During Treatment: Gait belt;Rolling walker (2 wheels);Oxygen  OT Visit Diagnosis: Unsteadiness on feet (R26.81);Other abnormalities of gait and mobility (R26.89);Muscle weakness (generalized) (M62.81);Ataxia, unspecified (R27.0)   Activity Tolerance Patient tolerated treatment well   Patient Left in chair;with call bell/phone within reach;with family/visitor present   Nurse Communication Mobility status;Other (comment) (purewick cannister full, bed soiled)  Time: 1610-9604 OT Time Calculation (min): 45 min  Charges: OT General Charges $OT Visit: 1 Visit OT Treatments $Self Care/Home Management : 23-37 mins $Therapeutic Activity: 8-22 mins  Bradd Canary, OTR/L Acute Rehab Services Office: [phone]   Lorre Munroe 04/16/2023, 1:01 PM

## 2023-04-16 NOTE — Progress Notes (Signed)
PROGRESS NOTE                                                                                                                                                                                                             Patient Demographics:    Erika Gates, is a 88 y.o. female, DOB - Nov 27, 1934, YQI:[id number]  Outpatient Primary MD for the patient is [name], MD    LOS - 7  Admit date - 04/09/2023    Chief Complaint  Patient presents with   Bradycardia   Fatigue       Brief Narrative (HPI from H&P)     -88 y.o. female with medical history significant of hypertension, hyperlipidemia, paroxysmal atrial fibrillation, CAD s/p CABG in 1996, diastolic CHF, neurologic myopathy with quadriparesis requiring cervical decompression 07/2022, diabetes mellitus type 2, and morbid obesity who presented from home after being noted to be more lethargic last night.  Workup in the ER suggestive of severe anemia, bradycardia and hypothermia, she also had mild hypercapnia, she was given packed RBC transfusion, her Eliquis was held, she was placed on PPI and BiPAP, GI was consulted and she was admitted to the hospital for further care   Subjective:   Patient developed dyspnea overnight, requiring BiPAP.   Assessment  & Plan :     Acute respiratory failure with hypoxia and hypercapnia secondary to acute on chronic diastolic congestive heart failure precipitated by severe anemia, hypercapnia worsened by poor respiratory effort due to hypoperfusion caused by severe anemia and poor respiratory effort. - Diuresed with Lasix, overnight BiPAP, mentation much improved, breathing much improved, transition to oxygen, up in chair in daytime if possible, I-S flutter valve for pulmonary toiletry, as needed Lasix, has been transfused 2 units of packed RBC with good improvement, continue to monitor. - she remains on 2 L [place], she was encouraged to use  incentive spirometer.  Acute on chronic diastolic CHF -She did develop dyspnea overnight, most likely due to volume overload, requiring CPAP support, will give 40 mg of IV Lasix, she was encouraged use incentive spirometry and flutter valve.   Acute blood loss anemia secondary to GI bleed  - Stool Hemoccult positive, was on Eliquis, Eliquis on hold, IV PPI, s/p 2 units packed RBC, posttransfusion H&H stable, no frank melena or bright red blood per rectum by Deboraha Sprang  GI - family refused invasive procedures and no EGD, H&H stable post PRBCs, family initially hesitant for EGD now has agreed EGD by Dr. Dulce Sellar on 04/13/2023. -This post endoscopy 8/28 by Dr. Dulce Sellar, significant for erosive gastritis, duodenopathy with nonbleeding duodenal ulcer, continue with Protonix 40 mg p.o. twice daily, hold Eliquis x 1 week. -Hemoglobin 7.5 this morning, will give another unit PRBC, given her frailty, and she needs to go back on Eliquis and less monitored environment in the outpatient setting next week -Continue with IV iron, to receive x 2 days   Sinus bradycardia - With EMS heart rates were reported to be in the 30s, was on high-dose beta-blocker which has been held.  Heart rate improving continue to monitor.  TSH borderline suggestive of sick thyroid.  Transient hypotension  Due to combination of severe anemia and bradycardia, much improved after supportive care  UTI.  No sepsis.  Empiric Rocephin follow cultures.   Pancytopenia  Acute on chronic.  Monitor.  Packed RBCs transfused.   Paroxysmal atrial fibrillation on chronic anticoagulation  Not on rate controlling agents currently, was on Eliquis, hold due to her anemia and monitor.   Acute metabolic encephalopathy  Initial lethargy due to hypotension and hypoperfusion, due to poor respiratory effort developed some hypercapnia, CT head unremarkable, overnight BiPAP on 04/09/2023, mentation much improved, monitor with supportive care.   AKI.  Stable and resolved  after transfusion, will need to resume home dose Lasix at time of discharge. Hyperlipidemia -Continue statin   GERD  - PPI IV  Diabetes mellitus type 2, without long-term use of insulin ISS   Lab Results  Component Value Date   HGBA1C 6.1 (H) 04/10/2023   CBG (last 3)  Recent Labs    04/15/23 2118 04/16/23 0735 04/16/23 1211  GLUCAP 123* 85 92            Condition - Extremely Guarded  Family Communication  : Daughter bedside daily  Code Status : Full code  Consults  : GI, palliative care for goals of care  PUD Prophylaxis : PPI   Procedures  :     EGD due 04/13/2023.    CT abdomen pelvis.  1. No evidence of retroperitoneal hematoma. 2. Small right pleural effusion with atelectasis or infiltrate at the lung bases. 3. Anasarca. 4. Cholelithiasis. 5. Diverticulosis without diverticulitis. 6. Periumbilical hernia to the right of midline containing fat and ascites. 7. Aortic atherosclerosis and coronary artery calcifications.   Echocardiogram.  1. No significant change from echo done in NOv 2022.  2. Left ventricular ejection fraction, by estimation, is 70 to 75%. The left ventricle has hyperdynamic function. The left ventricle has no regional wall motion abnormalities. There is mild left ventricular hypertrophy. Left ventricular diastolic parameters are consistent with Grade II diastolic dysfunction (pseudonormalization). Elevated left atrial pressure.  3. Right ventricular systolic function is mildly reduced. The right ventricular size is mildly enlarged.  4. Left atrial size was mildly dilated.  5. The mitral valve is normal in structure. Mild mitral valve regurgitation.  6. The aortic valve is normal in structure. Aortic valve regurgitation is not visualized.  7. The inferior vena cava is dilated in size with <50% respiratory variability, suggesting right atrial pressure of 15 mmHg  CT head.  Nonacute.      Disposition Plan  :    Status is: Inpatient  DVT  Prophylaxis  :  SCDs    Lab Results  Component Value Date   PLT 84 (L) 04/15/2023  Diet :  Diet Order             DIET DYS 3 Room service appropriate? Yes; Fluid consistency: Thin  Diet effective now                    Inpatient Medications  Scheduled Meds:  vitamin B-12  500 mcg Oral Daily   fluticasone  2 spray Each Nare Daily   insulin aspart  0-9 Units Subcutaneous TID WC   pantoprazole  40 mg Oral BID   Followed by   Melene Muller ON 05/09/2023] pantoprazole  40 mg Oral Daily   rosuvastatin  20 mg Oral QPM   sodium chloride flush  3 mL Intravenous Q12H   Continuous Infusions:  sodium chloride 10 mL/hr at 04/10/23 1427   lactated ringers 10 mL/hr at 04/13/23 1112   PRN Meds:.sodium chloride, acetaminophen **OR** acetaminophen, albuterol, docusate sodium, guaiFENesin, lidocaine, naphazoline-pheniramine, polyethylene glycol, sodium chloride    Objective:   Vitals:   04/16/23 0300 04/16/23 0405 04/16/23 0800 04/16/23 1300  BP: (!) 159/75 (!) 153/68 (!) 157/71 (!) 145/80  Pulse:  93 98 93  Resp:  20 20 20   Temp: 97.7 F (36.5 C)  98 F (36.7 C) 99 F (37.2 C)  TempSrc: Oral  Oral Oral  SpO2:  98% 91% 96%  Weight:  101.8 kg      Wt Readings from Last 3 Encounters:  04/16/23 101.8 kg  01/12/23 95.3 kg  10/27/22 95.3 kg     Intake/Output Summary (Last 24 hours) at 04/16/2023 1509 Last data filed at 04/16/2023 1300 Gross per 24 hour  Intake 818 ml  Output 2100 ml  Net -1282 ml     Physical Exam  Awake Alert, Oriented X 3, frail Symmetrical Chest wall movement, Good air movement bilaterally, bibasilar crackles RRR,No Gallops,Rubs or new Murmurs, No Parasternal Heave +ve B.Sounds, Abd Soft, No tenderness, No rebound - guarding or rigidity. No Cyanosis, Clubbing or edema, No new Rash or bruise         Data Review:    Recent Labs  Lab 04/11/23 0209 04/12/23 0244 04/12/23 1149 04/13/23 0116 04/14/23 0125 04/15/23 0310 04/16/23 0616  WBC 6.4  6.7 7.6 6.9 6.7 7.1  --   HGB 8.1* 7.9* 8.3* 7.8* 7.8* 7.5* 8.0*  HCT 29.4* 29.6* 30.4* 29.0* 29.5* 27.8* 29.3*  PLT 92* 88* 92* 87* 86* 84*  --   MCV 81.4 81.3 82.4 83.6 84.0 84.2  --   MCH 22.4* 21.7* 22.5* 22.5* 22.2* 22.7*  --   MCHC 27.6* 26.7* 27.3* 26.9* 26.4* 27.0*  --   RDW 20.0* 21.2* 21.3* 22.0* 22.7* 22.6*  --   LYMPHSABS 0.7 0.6*  --  0.6* 0.6* 0.5*  --   MONOABS 0.7 0.9  --  0.9 1.0 0.9  --   EOSABS 0.1 0.1  --  0.1 0.2 0.2  --   BASOSABS 0.0 0.0  --  0.0 0.0 0.0  --     Recent Labs  Lab 04/10/23 0158 04/10/23 1153 04/10/23 1153 04/11/23 0209 04/12/23 0244 04/13/23 0116 04/14/23 0125 04/15/23 0310  NA 138  --   --  139 140 139 139 139  K 4.3  --   --  4.1 3.7 4.5 4.5 4.2  CL 102  --   --  101 100 97* 97* 97*  CO2 27  --   --  32 35* 35* 36* 35*  ANIONGAP 9  --   --  6  5 7 6 7   GLUCOSE 82  --   --  106* 96 134* 96 89  BUN 36*  --   --  31* 22 22 14 12   CREATININE 1.25*  --   --  1.34* 0.97 0.98 0.73 0.78  CRP 0.5  --   --  0.7 0.8 2.5* 4.1*  --   PROCALCITON  --  <0.10   < > <0.10 <0.10 <0.10 <0.10 <0.10  TSH 6.567*  --   --  4.308  --   --   --   --   HGBA1C  --  6.1*  --   --   --   --   --   --   BNP  --  715.5*  --  870.1* 895.5* 819.2* 1,263.0*  --   MG  --   --   --  2.3 2.2 2.1 2.0  --   CALCIUM 9.7  --   --  9.5 9.7 9.6 9.7 9.8   < > = values in this interval not displayed.      Recent Labs  Lab 04/10/23 0158 04/10/23 1153 04/10/23 1153 04/11/23 0209 04/12/23 0244 04/13/23 0116 04/14/23 0125 04/15/23 0310  CRP 0.5  --   --  0.7 0.8 2.5* 4.1*  --   PROCALCITON  --  <0.10   < > <0.10 <0.10 <0.10 <0.10 <0.10  TSH 6.567*  --   --  4.308  --   --   --   --   HGBA1C  --  6.1*  --   --   --   --   --   --   BNP  --  715.5*  --  870.1* 895.5* 819.2* 1,263.0*  --   MG  --   --   --  2.3 2.2 2.1 2.0  --   CALCIUM 9.7  --   --  9.5 9.7 9.6 9.7 9.8   < > = values in this interval not displayed.    Recent Labs  Lab 04/10/23 0158 04/10/23 1153  04/11/23 0209 04/12/23 0244 04/12/23 1149 04/13/23 0116 04/14/23 0125 04/15/23 0310  WBC 5.8   < > 6.4 6.7 7.6 6.9 6.7 7.1  PLT 94*   < > 92* 88* 92* 87* 86* 84*  CRP 0.5  --  0.7 0.8  --  2.5* 4.1*  --   PROCALCITON  --    < > <0.10 <0.10  --  <0.10 <0.10 <0.10  CREATININE 1.25*  --  1.34* 0.97  --  0.98 0.73 0.78   < > = values in this interval not displayed.   No results for input(s): "TSH", "T4TOTAL", "T3FREE", "THYROIDAB" in the last 72 hours.  Invalid input(s): "FREET3"     Micro Results Recent Results (from the past 240 hour(s))  SARS Coronavirus 2 by RT PCR (hospital order, performed in [facility] hospital lab) *cepheid single result test* Anterior Nasal Swab     Status: None   Collection Time: 04/09/23  1:20 AM   Specimen: Anterior Nasal Swab  Result Value Ref Range Status   SARS Coronavirus 2 by RT PCR NEGATIVE NEGATIVE Final    Comment: Performed at [facility], [address], [place], [place] [address]    Radiology Reports DG Chest [place] 1 View  Result Date: 04/16/2023 CLINICAL DATA:  Hypoxia.  Hypercapnia.  Difficulty breathing. EXAM: PORTABLE CHEST 1 VIEW COMPARISON:  04/11/2023. FINDINGS: Redemonstration of chronic interstitial prominence, similar to the prior  study. No acute consolidation or major lung collapse. Note is again made of elevated right hemidiaphragm. There are atelectatic changes at the right lung base. Bilateral lateral costophrenic angle are clear. Stable cardio-mediastinal silhouette. There are surgical staples along the heart border and sternotomy wires, status post CABG (coronary artery bypass graft). No acute osseous abnormalities. Cervical spinal fixation hardware is seen. The soft tissues are within normal limits. IMPRESSION: 1. Chronic interstitial prominence. No acute consolidation or major lung collapse. Electronically Signed   By: Jules Schick M.D.   On: 04/16/2023 10:29      Signature  -   Huey Bienenstock M.D on 04/16/2023 at  3:09 PM   -  To page go to [email or web address]

## 2023-04-16 NOTE — TOC Transition Note (Signed)
Transition of Care [facility]) - CM/SW Discharge Note   Patient Details  Name: Erika Gates MRN: [id number] Date of Birth: 1935-01-02  Transition of Care [facility]) CM/SW Contact:  Gordy Clement, RN Phone Number: 04/16/2023, 3:26 PM   Clinical Narrative:     Plan is for patient to DC tomorrow to Home. Daughter will transport and is bringing portable from home . [facility] will provide PT and OT.  Daughter contemplated a hospital bed but at this time is declining. There are no additional TOC needs    Final next level of care: Home w Home Health Services Barriers to Discharge: English as a second language teacher, Continued Medical Work up   Patient Goals and CMS Choice CMS Medicare.gov Compare Post Acute Care list provided to:: Patient Choice offered to / list presented to : Patient, Adult Children  Discharge Placement                         Discharge Plan and Services Additional resources added to the After Visit Summary for                                       Social Determinants of Health (SDOH) Interventions SDOH Screenings   Food Insecurity: No Food Insecurity (04/09/2023)  Housing: Low Risk  (04/09/2023)  Transportation Needs: No Transportation Needs (04/09/2023)  Utilities: Not At Risk (04/09/2023)  Financial Resource Strain: Low Risk  (08/27/2022)   Received from [facility], [facility]  Tobacco Use: Low Risk  (01/12/2023)  Health Literacy: Adequate Health Literacy (08/27/2022)   Received from [facility], [facility]     Readmission Risk Interventions     No data to display

## 2023-04-16 NOTE — Plan of Care (Signed)

## 2023-04-17 ENCOUNTER — Encounter (HOSPITAL_COMMUNITY): Payer: Self-pay | Admitting: Gastroenterology

## 2023-04-17 ENCOUNTER — Inpatient Hospital Stay (HOSPITAL_COMMUNITY): Payer: Medicare PPO

## 2023-04-17 DIAGNOSIS — I5033 Acute on chronic diastolic (congestive) heart failure: Secondary | ICD-10-CM | POA: Diagnosis not present

## 2023-04-17 DIAGNOSIS — J9602 Acute respiratory failure with hypercapnia: Secondary | ICD-10-CM | POA: Diagnosis not present

## 2023-04-17 DIAGNOSIS — J9601 Acute respiratory failure with hypoxia: Secondary | ICD-10-CM | POA: Diagnosis not present

## 2023-04-17 DIAGNOSIS — D62 Acute posthemorrhagic anemia: Secondary | ICD-10-CM | POA: Diagnosis not present

## 2023-04-17 LAB — CBC
HCT: 33.1 % — ABNORMAL LOW (ref 36.0–46.0)
Hemoglobin: 8.7 g/dL — ABNORMAL LOW (ref 12.0–15.0)
MCH: 22.6 pg — ABNORMAL LOW (ref 26.0–34.0)
MCHC: 26.3 g/dL — ABNORMAL LOW (ref 30.0–36.0)
MCV: 86 fL (ref 80.0–100.0)
Platelets: 78 10*3/uL — ABNORMAL LOW (ref 150–400)
RBC: 3.85 MIL/uL — ABNORMAL LOW (ref 3.87–5.11)
RDW: 23 % — ABNORMAL HIGH (ref 11.5–15.5)
WBC: 10.4 10*3/uL (ref 4.0–10.5)
nRBC: 0.2 % (ref 0.0–0.2)

## 2023-04-17 LAB — BASIC METABOLIC PANEL
Anion gap: 6 (ref 5–15)
BUN: 17 mg/dL (ref 8–23)
CO2: 40 mmol/L — ABNORMAL HIGH (ref 22–32)
Calcium: 10.1 mg/dL (ref 8.9–10.3)
Chloride: 98 mmol/L (ref 98–111)
Creatinine, Ser: 0.77 mg/dL (ref 0.44–1.00)
GFR, Estimated: >60 mL/min (ref >60)
Glucose, Bld: 118 mg/dL — ABNORMAL HIGH (ref 70–99)
Potassium: 4 mmol/L (ref 3.5–5.1)
Sodium: 144 mmol/L (ref 135–145)

## 2023-04-17 LAB — GLUCOSE, CAPILLARY
Glucose-Capillary: 82 mg/dL (ref 70–99)
Glucose-Capillary: 108 mg/dL — ABNORMAL HIGH (ref 70–99)
Glucose-Capillary: 139 mg/dL — ABNORMAL HIGH (ref 70–99)

## 2023-04-17 LAB — BRAIN NATRIURETIC PEPTIDE: B Natriuretic Peptide: 1088.7 pg/mL — ABNORMAL HIGH (ref 0.0–100.0)

## 2023-04-17 MED ORDER — METOPROLOL TARTRATE 5 MG/5ML IV SOLN: 5.0000 mg | Freq: Once | INTRAVENOUS | Status: DC

## 2023-04-17 MED ORDER — POTASSIUM CHLORIDE CRYS ER 20 MEQ PO TBCR
40.0000 meq | EXTENDED_RELEASE_TABLET | Freq: Once | ORAL | Status: AC
  Administered 2023-04-17: 40 meq via ORAL
  Filled 2023-04-17: qty 4

## 2023-04-17 MED ORDER — METOPROLOL SUCCINATE ER 25 MG PO TB24
25.0000 mg | ORAL_TABLET | Freq: Once | ORAL | Status: AC
  Administered 2023-04-17: 25 mg via ORAL
  Filled 2023-04-17: qty 1

## 2023-04-17 MED ORDER — HEPARIN SODIUM (PORCINE) 5000 UNIT/ML IJ SOLN
5000.0000 [IU] | Freq: Three times a day (TID) | INTRAMUSCULAR | Status: DC
  Administered 2023-04-17 – 2023-04-18 (×3): 5000 [IU] via SUBCUTANEOUS
  Filled 2023-04-17 (×3): qty 1

## 2023-04-17 MED ORDER — SODIUM CHLORIDE 0.9 % IV SOLN
250.0000 mg | Freq: Once | INTRAVENOUS | Status: AC
  Administered 2023-04-17: 250 mg via INTRAVENOUS
  Filled 2023-04-17: qty 20

## 2023-04-17 MED ORDER — FUROSEMIDE 10 MG/ML IJ SOLN
40.0000 mg | Freq: Once | INTRAMUSCULAR | Status: AC
  Administered 2023-04-17: 40 mg via INTRAVENOUS
  Filled 2023-04-17: qty 4

## 2023-04-17 MED ORDER — GABAPENTIN 100 MG PO CAPS
100.0000 mg | ORAL_CAPSULE | Freq: Every day | ORAL | Status: DC
  Administered 2023-04-17 – 2023-04-21 (×5): 100 mg via ORAL
  Filled 2023-04-17 (×5): qty 1

## 2023-04-17 MED ORDER — METOPROLOL SUCCINATE ER 50 MG PO TB24
50.0000 mg | ORAL_TABLET | Freq: Every day | ORAL | Status: DC
  Administered 2023-04-17 – 2023-04-22 (×6): 50 mg via ORAL
  Filled 2023-04-17 (×6): qty 1

## 2023-04-17 NOTE — Plan of Care (Signed)
Increased 02 to 4l /[place]. Patient had increase in beta blocker today and IV lasix.

## 2023-04-17 NOTE — Plan of Care (Signed)

## 2023-04-17 NOTE — Progress Notes (Signed)
   04/17/23 0000  BiPAP/CPAP/SIPAP  BiPAP/CPAP/SIPAP Pt Type Adult  BiPAP/CPAP/SIPAP Resmed  Mask Type Full face mask  Mask Size Medium  Respiratory Rate 25 breaths/min  IPAP 12 cmH20  EPAP 6 cmH2O  FiO2 (%) 32 %  Flow Rate 3 lpm  Patient Home Equipment No  Auto Titrate No  BiPAP/CPAP /SiPAP Vitals  Pulse Rate 97  Resp (!) 25  MEWS Score/Color  MEWS Score 1  MEWS Score Color Green

## 2023-04-17 NOTE — Progress Notes (Signed)
PROGRESS NOTE                                                                                                                                                                                                             Patient Demographics:    Erika Gates, is a 88 y.o. female, DOB - 03-03-1935, XBJ:[id number]  Outpatient Primary MD for the patient is [name], MD    LOS - 8  Admit date - 04/09/2023    Chief Complaint  Patient presents with   Bradycardia   Fatigue       Brief Narrative (HPI from H&P)     -88 y.o. female with medical history significant of hypertension, hyperlipidemia, paroxysmal atrial fibrillation, CAD s/p CABG in 1996, diastolic CHF, neurologic myopathy with quadriparesis requiring cervical decompression 07/2022, diabetes mellitus type 2, and morbid obesity who presented from home after being noted to be more lethargic last night.  Workup in the ER suggestive of severe anemia, bradycardia and hypothermia, she also had mild hypercapnia, she was given packed RBC transfusion, her Eliquis was held, she was placed on PPI and BiPAP, GI was consulted and she was admitted to the hospital for further care   Subjective:   She is still complaining of shortness of breath, required CPAP overnight.   Assessment  & Plan :     Acute respiratory failure with hypoxia and hypercapnia secondary to acute on chronic diastolic congestive heart failure precipitated by severe anemia, hypercapnia worsened by poor respiratory effort due to hypoperfusion caused by severe anemia and poor respiratory effort. - Diuresed with Lasix, overnight BiPAP, mentation much improved, breathing much improved, transition to oxygen, up in chair in daytime if possible, I-S flutter valve for pulmonary toiletry, as needed Lasix, has been transfused 2 units of packed RBC with good improvement, continue to monitor. - she remains on 2 L [place], she was  encouraged to use incentive spirometer.  Acute on chronic diastolic CHF -[place] with significant dyspnea, significant evidence of volume overload as well, still requiring CPAP support overnight, with increased oxygen requirement up to 4 L oxygen this morning, will continue with IV Lasix, will increase to 40 mg IV Lasix twice daily, continue with daily weights, strict ins and outs.  .  Acute blood loss anemia secondary to GI bleed  - Stool Hemoccult positive, was on  Eliquis, Eliquis on hold, IV PPI, s/p 2 units packed RBC, posttransfusion H&H stable, no frank melena or bright red blood per rectum by [facility] - family refused invasive procedures and no EGD, H&H stable post PRBCs, family initially hesitant for EGD now has agreed EGD by Dr. Dulce Sellar on 04/13/2023. -This post endoscopy 8/28 by Dr. Dulce Sellar, significant for erosive gastritis, duodenopathy with nonbleeding duodenal ulcer, continue with Protonix 40 mg p.o. twice daily, hold Eliquis x 1 week. -Hemoglobin 7.5 this morning, will give another unit PRBC, given her frailty, and she needs to go back on Eliquis and less monitored environment in the outpatient setting next week -Continue with IV iron, today is day 3. -Hemoglobin has been stable for last 3 days, will start on subcu heparin  A-fib with RVR -heart rate started to increase today in the 120s, will resume metoprolol XL, will add as needed metoprolol.   Sinus bradycardia - With EMS heart rates were reported to be in the 30s, was on high-dose beta-blocker which has been held.  Heart rate improving continue to monitor.  TSH borderline suggestive of sick thyroid. -Now heart rate started to increase she will be resumed back on Toprol-XL gradually.  Transient hypotension  Due to combination of severe anemia and bradycardia, much improved after supportive care, now heart rate started to increase will resume back on Toprol-XL gradually.  UTI.  No sepsis.  Empiric Rocephin follow cultures.    Pancytopenia  Acute on chronic.  Monitor.  Packed RBCs transfused.   Paroxysmal atrial fibrillation on chronic anticoagulation  Not on rate controlling agents currently, was on Eliquis, hold due to her anemia and monitor.   Acute metabolic encephalopathy  Initial lethargy due to hypotension and hypoperfusion, due to poor respiratory effort developed some hypercapnia, CT head unremarkable, overnight BiPAP on 04/09/2023, mentation much improved, monitor with supportive care.   AKI.  Stable and resolved after transfusion,  Hyperlipidemia -Continue statin   GERD  - PPI IV  Diabetes mellitus type 2, without long-term use of insulin ISS   Lab Results  Component Value Date   HGBA1C 6.1 (H) 04/10/2023   CBG (last 3)  Recent Labs    04/16/23 2159 04/17/23 0840 04/17/23 1219  GLUCAP 144* 82 108*            Condition - Extremely Guarded  Family Communication  : Daughter bedside daily  Code Status : Full code  Consults  : GI, palliative care for goals of care  PUD Prophylaxis : PPI   Procedures  :     EGD due 04/13/2023.    CT abdomen pelvis.  1. No evidence of retroperitoneal hematoma. 2. Small right pleural effusion with atelectasis or infiltrate at the lung bases. 3. Anasarca. 4. Cholelithiasis. 5. Diverticulosis without diverticulitis. 6. Periumbilical hernia to the right of midline containing fat and ascites. 7. Aortic atherosclerosis and coronary artery calcifications.   Echocardiogram.  1. No significant change from echo done in NOv 2022.  2. Left ventricular ejection fraction, by estimation, is 70 to 75%. The left ventricle has hyperdynamic function. The left ventricle has no regional wall motion abnormalities. There is mild left ventricular hypertrophy. Left ventricular diastolic parameters are consistent with Grade II diastolic dysfunction (pseudonormalization). Elevated left atrial pressure.  3. Right ventricular systolic function is mildly reduced. The right  ventricular size is mildly enlarged.  4. Left atrial size was mildly dilated.  5. The mitral valve is normal in structure. Mild mitral valve regurgitation.  6. The  aortic valve is normal in structure. Aortic valve regurgitation is not visualized.  7. The inferior vena cava is dilated in size with <50% respiratory variability, suggesting right atrial pressure of 15 mmHg  CT head.  Nonacute.      Disposition Plan  :    Status is: Inpatient  DVT Prophylaxis  :  SCDs    Lab Results  Component Value Date   PLT 84 (L) 04/15/2023    Diet :  Diet Order             DIET DYS 3 Room service appropriate? Yes; Fluid consistency: Thin  Diet effective now                    Inpatient Medications  Scheduled Meds:  vitamin B-12  500 mcg Oral Daily   fluticasone  2 spray Each Nare Daily   heparin injection (subcutaneous)  5,000 Units Subcutaneous Q8H   insulin aspart  0-9 Units Subcutaneous TID WC   metoprolol succinate  50 mg Oral Daily   pantoprazole  40 mg Oral BID   Followed by   Melene Muller ON 05/09/2023] pantoprazole  40 mg Oral Daily   rosuvastatin  20 mg Oral QPM   sodium chloride flush  3 mL Intravenous Q12H   Continuous Infusions:  sodium chloride 10 mL/hr at 04/10/23 1427   lactated ringers 10 mL/hr at 04/13/23 1112   PRN Meds:.sodium chloride, acetaminophen **OR** acetaminophen, albuterol, docusate sodium, guaiFENesin, lidocaine, naphazoline-pheniramine, polyethylene glycol, sodium chloride    Objective:   Vitals:   04/17/23 0834 04/17/23 0900 04/17/23 1000 04/17/23 1100  BP: 130/66   123/78  Pulse: 90 88 98   Resp: (!) 24 13 20    Temp: 97.9 F (36.6 C)   98.2 F (36.8 C)  TempSrc: Oral   Oral  SpO2: 91% 94% 90%   Weight:        Wt Readings from Last 3 Encounters:  04/17/23 94.2 kg  01/12/23 95.3 kg  10/27/22 95.3 kg     Intake/Output Summary (Last 24 hours) at 04/17/2023 1322 Last data filed at 04/17/2023 1106 Gross per 24 hour  Intake 380 ml  Output  2900 ml  Net -2520 ml     Physical Exam  Awake Alert, Oriented X 3, frail Symmetrical Chest wall movement, Good air movement bilaterally, crackles bilaterally Irregular irregular, tachycardic no Gallops,Rubs or new Murmurs, No Parasternal Heave +ve B.Sounds, Abd Soft, No tenderness, No rebound - guarding or rigidity. No Cyanosis, Clubbing or edema, No new Rash or bruise         Data Review:    Recent Labs  Lab 04/11/23 0209 04/12/23 0244 04/12/23 1149 04/13/23 0116 04/14/23 0125 04/15/23 0310 04/16/23 0616 04/16/23 1742  WBC 6.4 6.7 7.6 6.9 6.7 7.1  --   --   HGB 8.1* 7.9* 8.3* 7.8* 7.8* 7.5* 8.0* 8.5*  HCT 29.4* 29.6* 30.4* 29.0* 29.5* 27.8* 29.3* 31.5*  PLT 92* 88* 92* 87* 86* 84*  --   --   MCV 81.4 81.3 82.4 83.6 84.0 84.2  --   --   MCH 22.4* 21.7* 22.5* 22.5* 22.2* 22.7*  --   --   MCHC 27.6* 26.7* 27.3* 26.9* 26.4* 27.0*  --   --   RDW 20.0* 21.2* 21.3* 22.0* 22.7* 22.6*  --   --   LYMPHSABS 0.7 0.6*  --  0.6* 0.6* 0.5*  --   --   MONOABS 0.7 0.9  --  0.9 1.0 0.9  --   --  EOSABS 0.1 0.1  --  0.1 0.2 0.2  --   --   BASOSABS 0.0 0.0  --  0.0 0.0 0.0  --   --     Recent Labs  Lab 04/11/23 0209 04/12/23 0244 04/13/23 0116 04/14/23 0125 04/15/23 0310 04/16/23 1742  NA 139 140 139 139 139 141  K 4.1 3.7 4.5 4.5 4.2 3.7  CL 101 100 97* 97* 97* 97*  CO2 32 35* 35* 36* 35* 37*  ANIONGAP 6 5 7 6 7 7   GLUCOSE 106* 96 134* 96 89 111*  BUN 31* 22 22 14 12 13   CREATININE 1.34* 0.97 0.98 0.73 0.78 0.77  CRP 0.7 0.8 2.5* 4.1*  --   --   PROCALCITON <0.10 <0.10 <0.10 <0.10 <0.10  --   TSH 4.308  --   --   --   --   --   BNP 870.1* 895.5* 819.2* 1,263.0*  --   --   MG 2.3 2.2 2.1 2.0  --   --   CALCIUM 9.5 9.7 9.6 9.7 9.8 9.9      Recent Labs  Lab 04/11/23 0209 04/12/23 0244 04/13/23 0116 04/14/23 0125 04/15/23 0310 04/16/23 1742  CRP 0.7 0.8 2.5* 4.1*  --   --   PROCALCITON <0.10 <0.10 <0.10 <0.10 <0.10  --   TSH 4.308  --   --   --   --   --    BNP 870.1* 895.5* 819.2* 1,263.0*  --   --   MG 2.3 2.2 2.1 2.0  --   --   CALCIUM 9.5 9.7 9.6 9.7 9.8 9.9    Recent Labs  Lab 04/11/23 0209 04/12/23 0244 04/12/23 1149 04/13/23 0116 04/14/23 0125 04/15/23 0310 04/16/23 1742  WBC 6.4 6.7 7.6 6.9 6.7 7.1  --   PLT 92* 88* 92* 87* 86* 84*  --   CRP 0.7 0.8  --  2.5* 4.1*  --   --   PROCALCITON <0.10 <0.10  --  <0.10 <0.10 <0.10  --   CREATININE 1.34* 0.97  --  0.98 0.73 0.78 0.77   No results for input(s): "TSH", "T4TOTAL", "T3FREE", "THYROIDAB" in the last 72 hours.  Invalid input(s): "FREET3"     Micro Results Recent Results (from the past 240 hour(s))  SARS Coronavirus 2 by RT PCR (hospital order, performed in [facility] hospital lab) *cepheid single result test* Anterior Nasal Swab     Status: None   Collection Time: 04/09/23  1:20 AM   Specimen: Anterior Nasal Swab  Result Value Ref Range Status   SARS Coronavirus 2 by RT PCR NEGATIVE NEGATIVE Final    Comment: Performed at [facility], [address] [place], [place] [address]    Radiology Reports DG Chest [place] 1 View  Result Date: 04/16/2023 CLINICAL DATA:  Hypoxia.  Hypercapnia.  Difficulty breathing. EXAM: PORTABLE CHEST 1 VIEW COMPARISON:  04/11/2023. FINDINGS: Redemonstration of chronic interstitial prominence, similar to the prior study. No acute consolidation or major lung collapse. Note is again made of elevated right hemidiaphragm. There are atelectatic changes at the right lung base. Bilateral lateral costophrenic angle are clear. Stable cardio-mediastinal silhouette. There are surgical staples along the heart border and sternotomy wires, status post CABG (coronary artery bypass graft). No acute osseous abnormalities. Cervical spinal fixation hardware is seen. The soft tissues are within normal limits. IMPRESSION: 1. Chronic interstitial prominence. No acute consolidation or major lung collapse. Electronically Signed   By: Jules Schick  M.D.   On:  04/16/2023 10:29      Signature  -   Huey Bienenstock M.D on 04/17/2023 at 1:22 PM   -  To page go to [email or web address]

## 2023-04-18 DIAGNOSIS — J9602 Acute respiratory failure with hypercapnia: Secondary | ICD-10-CM | POA: Diagnosis not present

## 2023-04-18 DIAGNOSIS — J9601 Acute respiratory failure with hypoxia: Secondary | ICD-10-CM | POA: Diagnosis not present

## 2023-04-18 LAB — CBC
HCT: 33.1 % — ABNORMAL LOW (ref 36.0–46.0)
Hemoglobin: 8.7 g/dL — ABNORMAL LOW (ref 12.0–15.0)
MCH: 23.1 pg — ABNORMAL LOW (ref 26.0–34.0)
MCHC: 26.3 g/dL — ABNORMAL LOW (ref 30.0–36.0)
MCV: 88 fL (ref 80.0–100.0)
Platelets: 86 10*3/uL — ABNORMAL LOW (ref 150–400)
RBC: 3.76 MIL/uL — ABNORMAL LOW (ref 3.87–5.11)
RDW: 23.1 % — ABNORMAL HIGH (ref 11.5–15.5)
WBC: 9.2 10*3/uL (ref 4.0–10.5)
nRBC: 0.2 % (ref 0.0–0.2)

## 2023-04-18 LAB — DIFFERENTIAL
Abs Immature Granulocytes: 0.04 10*3/uL (ref 0.00–0.07)
Basophils Absolute: 0 10*3/uL (ref 0.0–0.1)
Basophils Relative: 0 %
Eosinophils Absolute: 0.3 10*3/uL (ref 0.0–0.5)
Eosinophils Relative: 3 %
Immature Granulocytes: 0 %
Lymphocytes Relative: 6 %
Lymphs Abs: 0.6 10*3/uL — ABNORMAL LOW (ref 0.7–4.0)
Monocytes Absolute: 1 10*3/uL (ref 0.1–1.0)
Monocytes Relative: 11 %
Neutro Abs: 7.3 10*3/uL (ref 1.7–7.7)
Neutrophils Relative %: 80 %
Smear Review: DECREASED

## 2023-04-18 LAB — TECHNOLOGIST SMEAR REVIEW: Plt Morphology: DECREASED

## 2023-04-18 LAB — BASIC METABOLIC PANEL
Anion gap: 4 — ABNORMAL LOW (ref 5–15)
BUN: 16 mg/dL (ref 8–23)
CO2: 39 mmol/L — ABNORMAL HIGH (ref 22–32)
Calcium: 10 mg/dL (ref 8.9–10.3)
Chloride: 97 mmol/L — ABNORMAL LOW (ref 98–111)
Creatinine, Ser: 0.87 mg/dL (ref 0.44–1.00)
GFR, Estimated: >60 mL/min (ref >60)
Glucose, Bld: 87 mg/dL (ref 70–99)
Potassium: 4 mmol/L (ref 3.5–5.1)
Sodium: 140 mmol/L (ref 135–145)

## 2023-04-18 LAB — GLUCOSE, CAPILLARY
Glucose-Capillary: 81 mg/dL (ref 70–99)
Glucose-Capillary: 141 mg/dL — ABNORMAL HIGH (ref 70–99)
Glucose-Capillary: 114 mg/dL — ABNORMAL HIGH (ref 70–99)
Glucose-Capillary: 130 mg/dL — ABNORMAL HIGH (ref 70–99)

## 2023-04-18 LAB — BRAIN NATRIURETIC PEPTIDE: B Natriuretic Peptide: 1613 pg/mL — ABNORMAL HIGH (ref 0.0–100.0)

## 2023-04-18 MED ORDER — FUROSEMIDE 10 MG/ML IJ SOLN
40.0000 mg | Freq: Two times a day (BID) | INTRAMUSCULAR | Status: AC
  Administered 2023-04-18 (×2): 40 mg via INTRAVENOUS
  Filled 2023-04-18 (×2): qty 4

## 2023-04-18 MED ORDER — ONDANSETRON HCL 4 MG/2ML IJ SOLN
4.0000 mg | Freq: Four times a day (QID) | INTRAMUSCULAR | Status: DC | PRN
  Filled 2023-04-18: qty 2

## 2023-04-18 MED ORDER — TRAZODONE HCL 50 MG PO TABS
50.0000 mg | ORAL_TABLET | Freq: Every evening | ORAL | Status: DC | PRN
  Administered 2023-04-18: 50 mg via ORAL
  Filled 2023-04-18: qty 1

## 2023-04-18 NOTE — Plan of Care (Signed)
Heart rate more controlled today and less time to recover with 02 sat and respirations with activity.

## 2023-04-18 NOTE — Progress Notes (Signed)
PROGRESS NOTE                                                                                                                                                                                                             Patient Demographics:    Erika Gates, is a 88 y.o. female, DOB - [birth year], AVW:[id number]  Outpatient Primary MD for the patient is [name], MD    LOS - 9  Admit date - 04/09/2023    Chief Complaint  Patient presents with   Bradycardia   Fatigue       Brief Narrative (HPI from H&P)     -88 y.o. female with medical history significant of hypertension, hyperlipidemia, paroxysmal atrial fibrillation, CAD s/p CABG in 1996, diastolic CHF, neurologic myopathy with quadriparesis requiring cervical decompression 07/2022, diabetes mellitus type 2, and morbid obesity who presented from home after being noted to be more lethargic last night.  Workup in the ER suggestive of severe anemia, bradycardia and hypothermia, she also had mild hypercapnia, she was given packed RBC transfusion, her Eliquis was held, she was placed on PPI and BiPAP, GI was consulted and she was admitted to the hospital for further care   Subjective:   She did not require CPAP overnight,   Assessment  & Plan :     Acute respiratory failure with hypoxia and hypercapnia secondary to acute on chronic diastolic congestive heart failure precipitated by severe anemia, hypercapnia worsened by poor respiratory effort due to hypoperfusion caused by severe anemia and poor respiratory effort. - Diuresed with Lasix, overnight BiPAP, mentation much improved, breathing much improved, transition to oxygen, up in chair in daytime if possible, I-S flutter valve for pulmonary toiletry, as needed Lasix, has been transfused 2 units of packed RBC with good improvement, continue to monitor. -4 L nasal cannula this morning, was able to lowered to 2 L oxygen, she  was encouraged again to use incentive spirometer  Acute on chronic diastolic CHF -Mains with significant dyspnea, significant evidence of volume overload as well, requiring as needed CPAP. -BNP remains significantly elevated, volume status started to improve, but significantly volume overloaded, continue with 40 mg of IV Lasix twice daily, daily weights, strict ins and out, monitor BMP closely.  Acute blood loss anemia secondary to GI bleed  - Stool Hemoccult positive, was on Eliquis, Eliquis  on hold, IV PPI, s/p 2 units packed RBC, posttransfusion H&H stable, no frank melena or bright red blood per rectum by [facility] - family refused invasive procedures and no EGD, H&H stable post PRBCs, family initially hesitant for EGD now has agreed EGD by Dr. Dulce Sellar on 04/13/2023. -This post endoscopy 8/28 by Dr. Dulce Sellar, significant for erosive gastritis, duodenopathy with nonbleeding duodenal ulcer, continue with Protonix 40 mg p.o. twice daily, hold Eliquis x 1 week. -Hemoglobin 7.5 this morning, will give another unit PRBC, given her frailty, and she needs to go back on Eliquis and less monitored environment in the outpatient setting next week -treated with IV iron x 3 doses -Globin remained stable  A-fib with RVR -heart rate started to increase today in the 120s, will resume metoprolol XL, will add as needed metoprolol.   Sinus bradycardia - With EMS heart rates were reported to be in the 30s, was on high-dose beta-blocker which has been held.  Heart rate improving continue to monitor.  TSH borderline suggestive of sick thyroid. -Now heart rate started to increase so resumed back on Toprol-XL gradually.  Transient hypotension  Due to combination of severe anemia and bradycardia, much improved after supportive care, now heart rate started to increase ,back on Toprol-XL gradually.  UTI.  No sepsis.  Empiric Rocephin follow cultures.   Pancytopenia  Acute on chronic.  Monitor.  Packed RBCs transfused.    Paroxysmal atrial fibrillation on chronic anticoagulation  Not on rate controlling agents currently, was on Eliquis, hold due to her anemia and monitor.   Acute metabolic encephalopathy  Initial lethargy due to hypotension and hypoperfusion, due to poor respiratory effort developed some hypercapnia, CT head unremarkable, overnight BiPAP on 04/09/2023, mentation much improved, monitor with supportive care.   AKI.  Stable and resolved after transfusion,  Hyperlipidemia -Continue statin   GERD  - PPI IV  Diabetes mellitus type 2, without long-term use of insulin ISS   Lab Results  Component Value Date   HGBA1C 6.1 (H) 04/10/2023   CBG (last 3)  Recent Labs    04/17/23 1639 04/18/23 0827 04/18/23 1148  GLUCAP 139* 81 141*            Condition - Extremely Guarded  Family Communication  : Daughter bedside daily  Code Status : Full code  Consults  : GI, palliative care for goals of care  PUD Prophylaxis : PPI   Procedures  :     EGD due 04/13/2023.    CT abdomen pelvis.  1. No evidence of retroperitoneal hematoma. 2. Small right pleural effusion with atelectasis or infiltrate at the lung bases. 3. Anasarca. 4. Cholelithiasis. 5. Diverticulosis without diverticulitis. 6. Periumbilical hernia to the right of midline containing fat and ascites. 7. Aortic atherosclerosis and coronary artery calcifications.   Echocardiogram.  1. No significant change from echo done in NOv 2022.  2. Left ventricular ejection fraction, by estimation, is 70 to 75%. The left ventricle has hyperdynamic function. The left ventricle has no regional wall motion abnormalities. There is mild left ventricular hypertrophy. Left ventricular diastolic parameters are consistent with Grade II diastolic dysfunction (pseudonormalization). Elevated left atrial pressure.  3. Right ventricular systolic function is mildly reduced. The right ventricular size is mildly enlarged.  4. Left atrial size was mildly dilated.   5. The mitral valve is normal in structure. Mild mitral valve regurgitation.  6. The aortic valve is normal in structure. Aortic valve regurgitation is not visualized.  7. The inferior vena  cava is dilated in size with <50% respiratory variability, suggesting right atrial pressure of 15 mmHg  CT head.  Nonacute.      Disposition Plan  :    Status is: Inpatient  DVT Prophylaxis  :  SCDs    Lab Results  Component Value Date   PLT 86 (L) 04/18/2023    Diet :  Diet Order             DIET DYS 3 Room service appropriate? Yes; Fluid consistency: Thin  Diet effective now                    Inpatient Medications  Scheduled Meds:  vitamin B-12  500 mcg Oral Daily   fluticasone  2 spray Each Nare Daily   furosemide  40 mg Intravenous BID   gabapentin  100 mg Oral QHS   insulin aspart  0-9 Units Subcutaneous TID WC   metoprolol succinate  50 mg Oral Daily   pantoprazole  40 mg Oral BID   Followed by   Melene Muller ON 05/09/2023] pantoprazole  40 mg Oral Daily   rosuvastatin  20 mg Oral QPM   sodium chloride flush  3 mL Intravenous Q12H   Continuous Infusions:  sodium chloride 10 mL/hr at 04/10/23 1427   lactated ringers 10 mL/hr at 04/13/23 1112   PRN Meds:.sodium chloride, acetaminophen **OR** acetaminophen, albuterol, docusate sodium, guaiFENesin, lidocaine, naphazoline-pheniramine, polyethylene glycol, sodium chloride    Objective:   Vitals:   04/18/23 0100 04/18/23 0405 04/18/23 1100 04/18/23 1145  BP: 132/73   136/64  Pulse: 81 75    Resp: 16     Temp: 98.4 F (36.9 C)   98.4 F (36.9 C)  TempSrc: Oral   Oral  SpO2: 94% 100% 92%   Weight:        Wt Readings from Last 3 Encounters:  04/17/23 94.2 kg  01/12/23 95.3 kg  10/27/22 95.3 kg     Intake/Output Summary (Last 24 hours) at 04/18/2023 1316 Last data filed at 04/18/2023 0914 Gross per 24 hour  Intake 560 ml  Output 300 ml  Net 260 ml     Physical Exam  Awake Alert, Oriented X 3, No new F.N  deficits, Normal affect Symmetrical Chest wall movement, proved air entry, crackles has improved at the bases irregular,No Gallops,Rubs or new Murmurs, No Parasternal Heave +ve B.Sounds, Abd Soft, No tenderness, No rebound - guarding or rigidity. No Cyanosis, Clubbing or edema, No new Rash or bruise          Data Review:    Recent Labs  Lab 04/12/23 0244 04/12/23 1149 04/13/23 0116 04/14/23 0125 04/15/23 0310 04/16/23 0616 04/16/23 1742 04/17/23 1813 04/18/23 0855  WBC 6.7   < > 6.9 6.7 7.1  --   --  10.4 9.2  HGB 7.9*   < > 7.8* 7.8* 7.5* 8.0* 8.5* 8.7* 8.7*  HCT 29.6*   < > 29.0* 29.5* 27.8* 29.3* 31.5* 33.1* 33.1*  PLT 88*   < > 87* 86* 84*  --   --  78* 86*  MCV 81.3   < > 83.6 84.0 84.2  --   --  86.0 88.0  MCH 21.7*   < > 22.5* 22.2* 22.7*  --   --  22.6* 23.1*  MCHC 26.7*   < > 26.9* 26.4* 27.0*  --   --  26.3* 26.3*  RDW 21.2*   < > 22.0* 22.7* 22.6*  --   --  23.0* 23.1*  LYMPHSABS 0.6*  --  0.6* 0.6* 0.5*  --   --   --  0.6*  MONOABS 0.9  --  0.9 1.0 0.9  --   --   --  1.0  EOSABS 0.1  --  0.1 0.2 0.2  --   --   --  0.3  BASOSABS 0.0  --  0.0 0.0 0.0  --   --   --  0.0   < > = values in this interval not displayed.    Recent Labs  Lab 04/12/23 0244 04/13/23 0116 04/14/23 0125 04/15/23 0310 04/16/23 1742 04/17/23 1813 04/18/23 0451 04/18/23 0855  NA 140 139 139 139 141 144 140  --   K 3.7 4.5 4.5 4.2 3.7 4.0 4.0  --   CL 100 97* 97* 97* 97* 98 97*  --   CO2 35* 35* 36* 35* 37* 40* 39*  --   ANIONGAP 5 7 6 7 7 6  4*  --   GLUCOSE 96 134* 96 89 111* 118* 87  --   BUN 22 22 14 12 13 17 16   --   CREATININE 0.97 0.98 0.73 0.78 0.77 0.77 0.87  --   CRP 0.8 2.5* 4.1*  --   --   --   --   --   PROCALCITON <0.10 <0.10 <0.10 <0.10  --   --   --   --   BNP 895.5* 819.2* 1,263.0*  --   --  1,088.7*  --  1,613.0*  MG 2.2 2.1 2.0  --   --   --   --   --   CALCIUM 9.7 9.6 9.7 9.8 9.9 10.1 10.0  --       Recent Labs  Lab 04/12/23 0244 04/13/23 0116  04/14/23 0125 04/15/23 0310 04/16/23 1742 04/17/23 1813 04/18/23 0451 04/18/23 0855  CRP 0.8 2.5* 4.1*  --   --   --   --   --   PROCALCITON <0.10 <0.10 <0.10 <0.10  --   --   --   --   BNP 895.5* 819.2* 1,263.0*  --   --  1,088.7*  --  1,613.0*  MG 2.2 2.1 2.0  --   --   --   --   --   CALCIUM 9.7 9.6 9.7 9.8 9.9 10.1 10.0  --     Recent Labs  Lab 04/12/23 0244 04/12/23 1149 04/13/23 0116 04/14/23 0125 04/15/23 0310 04/16/23 1742 04/17/23 1813 04/18/23 0451 04/18/23 0855  WBC 6.7   < > 6.9 6.7 7.1  --  10.4  --  9.2  PLT 88*   < > 87* 86* 84*  --  78*  --  86*  CRP 0.8  --  2.5* 4.1*  --   --   --   --   --   PROCALCITON <0.10  --  <0.10 <0.10 <0.10  --   --   --   --   CREATININE 0.97  --  0.98 0.73 0.78 0.77 0.77 0.87  --    < > = values in this interval not displayed.   No results for input(s): "TSH", "T4TOTAL", "T3FREE", "THYROIDAB" in the last 72 hours.  Invalid input(s): "FREET3"     Micro Results Recent Results (from the past 240 hour(s))  SARS Coronavirus 2 by RT PCR (hospital order, performed in [facility] hospital lab) *cepheid single result test* Anterior Nasal Swab     Status: None   Collection  Time: 04/09/23  1:20 AM   Specimen: Anterior Nasal Swab  Result Value Ref Range Status   SARS Coronavirus 2 by RT PCR NEGATIVE NEGATIVE Final    Comment: Performed at [facility], [address], [place], [place] [address]    Radiology Reports DG Chest [place] 1 View  Result Date: 04/17/2023 CLINICAL DATA:  Bradycardia.  Dyspnea. EXAM: PORTABLE CHEST 1 VIEW COMPARISON:  One-view chest x-ray 04/16/2023 FINDINGS: The heart is enlarged. Chronic elevation the right hemidiaphragm is noted. Diffuse interstitial pattern is increasing. Bibasilar linear opacities likely reflect atelectasis. Advanced degenerative changes are present in the shoulders bilaterally. Cervical spine fusion is noted. IMPRESSION: 1. Cardiomegaly and increasing interstitial pattern compatible  with congestive heart failure. Electronically Signed   By: Marin Roberts M.D.   On: 04/17/2023 14:14   DG Chest Port 1 View  Result Date: 04/16/2023 CLINICAL DATA:  Hypoxia.  Hypercapnia.  Difficulty breathing. EXAM: PORTABLE CHEST 1 VIEW COMPARISON:  04/11/2023. FINDINGS: Redemonstration of chronic interstitial prominence, similar to the prior study. No acute consolidation or major lung collapse. Note is again made of elevated right hemidiaphragm. There are atelectatic changes at the right lung base. Bilateral lateral costophrenic angle are clear. Stable cardio-mediastinal silhouette. There are surgical staples along the heart border and sternotomy wires, status post CABG (coronary artery bypass graft). No acute osseous abnormalities. Cervical spinal fixation hardware is seen. The soft tissues are within normal limits. IMPRESSION: 1. Chronic interstitial prominence. No acute consolidation or major lung collapse. Electronically Signed   By: Jules Schick M.D.   On: 04/16/2023 10:29      Signature  -   Huey Bienenstock M.D on 04/18/2023 at 1:16 PM   -  To page go to [email or web address]

## 2023-04-19 DIAGNOSIS — D62 Acute posthemorrhagic anemia: Secondary | ICD-10-CM | POA: Diagnosis not present

## 2023-04-19 DIAGNOSIS — J9602 Acute respiratory failure with hypercapnia: Secondary | ICD-10-CM | POA: Diagnosis not present

## 2023-04-19 DIAGNOSIS — I5033 Acute on chronic diastolic (congestive) heart failure: Secondary | ICD-10-CM | POA: Diagnosis not present

## 2023-04-19 DIAGNOSIS — J9601 Acute respiratory failure with hypoxia: Secondary | ICD-10-CM | POA: Diagnosis not present

## 2023-04-19 LAB — BASIC METABOLIC PANEL
Anion gap: 7 (ref 5–15)
BUN: 23 mg/dL (ref 8–23)
CO2: 38 mmol/L — ABNORMAL HIGH (ref 22–32)
Calcium: 9.8 mg/dL (ref 8.9–10.3)
Chloride: 95 mmol/L — ABNORMAL LOW (ref 98–111)
Creatinine, Ser: 1.05 mg/dL — ABNORMAL HIGH (ref 0.44–1.00)
GFR, Estimated: 52 mL/min — ABNORMAL LOW (ref >60)
Glucose, Bld: 91 mg/dL (ref 70–99)
Potassium: 4 mmol/L (ref 3.5–5.1)
Sodium: 140 mmol/L (ref 135–145)

## 2023-04-19 LAB — GLUCOSE, CAPILLARY
Glucose-Capillary: 87 mg/dL (ref 70–99)
Glucose-Capillary: 94 mg/dL (ref 70–99)
Glucose-Capillary: 112 mg/dL — ABNORMAL HIGH (ref 70–99)
Glucose-Capillary: 137 mg/dL — ABNORMAL HIGH (ref 70–99)

## 2023-04-19 LAB — CBC
HCT: 32.4 % — ABNORMAL LOW (ref 36.0–46.0)
Hemoglobin: 8.5 g/dL — ABNORMAL LOW (ref 12.0–15.0)
MCH: 22.5 pg — ABNORMAL LOW (ref 26.0–34.0)
MCHC: 26.2 g/dL — ABNORMAL LOW (ref 30.0–36.0)
MCV: 85.9 fL (ref 80.0–100.0)
Platelets: 85 10*3/uL — ABNORMAL LOW (ref 150–400)
RBC: 3.77 MIL/uL — ABNORMAL LOW (ref 3.87–5.11)
RDW: 23.2 % — ABNORMAL HIGH (ref 11.5–15.5)
WBC: 8.6 10*3/uL (ref 4.0–10.5)
nRBC: 0 % (ref 0.0–0.2)

## 2023-04-19 LAB — BRAIN NATRIURETIC PEPTIDE: B Natriuretic Peptide: 993 pg/mL — ABNORMAL HIGH (ref 0.0–100.0)

## 2023-04-19 MED ORDER — FUROSEMIDE 10 MG/ML IJ SOLN
40.0000 mg | Freq: Two times a day (BID) | INTRAMUSCULAR | Status: AC
  Administered 2023-04-19 (×2): 40 mg via INTRAVENOUS
  Filled 2023-04-19 (×2): qty 4

## 2023-04-19 NOTE — Progress Notes (Signed)
PROGRESS NOTE                                                                                                                                                                                                             Patient Demographics:    Erika Gates, is a 88 y.o. female, DOB - [birth year], WUJ:[id number]  Outpatient Primary MD for the patient is [name], MD    LOS - 10  Admit date - 04/09/2023    Chief Complaint  Patient presents with   Bradycardia   Fatigue       Brief Narrative (HPI from H&P)     -88 y.o. female with medical history significant of hypertension, hyperlipidemia, paroxysmal atrial fibrillation, CAD s/p CABG in 1996, diastolic CHF, neurologic myopathy with quadriparesis requiring cervical decompression 07/2022, diabetes mellitus type 2, and morbid obesity who presented from home after being noted to be more lethargic last night.  Workup in the ER suggestive of severe anemia, bradycardia and hypothermia, she also had mild hypercapnia, she was given packed RBC transfusion, her Eliquis was held, she was placed on PPI and BiPAP, GI was consulted and she was admitted to the hospital for further care   Subjective:   Difficult events overnight, she did not require CPAP, she is more sleepy this morning after she got trazodone overnight   Assessment  & Plan :     Acute respiratory failure with hypoxia and hypercapnia secondary to acute on chronic diastolic congestive heart failure precipitated by severe anemia, hypercapnia worsened by poor respiratory effort due to hypoperfusion caused by severe anemia and poor respiratory effort. - Diuresed with Lasix, overnight BiPAP, mentation much improved, breathing much improved, transition to oxygen, up in chair in daytime if possible, I-S flutter valve for pulmonary toiletry, as needed Lasix, has been transfused 2 units of packed RBC with good improvement, continue  to monitor. -4 L nasal cannula this morning, was able to lowered to 2 L oxygen, she was encouraged again to use incentive spirometer  Acute on chronic diastolic CHF -Mains with significant dyspnea, significant evidence of volume overload as well, requiring as needed CPAP. -BNP remains significantly elevated, volume status started to improve, but significantly volume overloaded, continue with 40 mg of IV Lasix twice daily, daily weights, strict ins and out, monitor BMP closely. -Respiratory status improving, BNP trending  down, continue with IV Lasix 40 mg twice daily  Acute blood loss anemia secondary to GI bleed  - Stool Hemoccult positive, was on Eliquis, Eliquis on hold, IV PPI, s/p 2 units packed RBC, posttransfusion H&H stable, no frank melena or bright red blood per rectum by [facility] - family refused invasive procedures and no EGD, H&H stable post PRBCs, family initially hesitant for EGD now has agreed EGD by Dr. Dulce Sellar on 04/13/2023. -This post endoscopy 8/28 by Dr. Dulce Sellar, significant for erosive gastritis, duodenopathy with nonbleeding duodenal ulcer, continue with Protonix 40 mg p.o. twice daily, hold Eliquis x 1 week. -Hemoglobin 7.5 this morning, will give another unit PRBC, given her frailty, and she needs to go back on Eliquis and less monitored environment in the outpatient setting next week -treated with IV iron x 3 doses -Hemoglobin remained stable, will start on Eliquis tomorrow.  A-fib with RVR -heart rate started to increase today in the 120s, will resume metoprolol XL, at a lower dose 50 mg given she presented with bradycardia (home dose was 100)   Sinus bradycardia - With EMS heart rates were reported to be in the 30s, was on high-dose beta-blocker which has been held.  Heart rate improving continue to monitor.  TSH borderline suggestive of sick thyroid. -Now heart rate started to increase so resumed back on Toprol-XL gradually.  Transient hypotension  Due to combination of  severe anemia and bradycardia, much improved after supportive care, now heart rate started to increase ,back on Toprol-XL gradually.  UTI.  No sepsis.  Empiric Rocephin follow cultures.   Pancytopenia  Acute on chronic.  Monitor.  Packed RBCs transfused.   Paroxysmal atrial fibrillation on chronic anticoagulation  Not on rate controlling agents currently, was on Eliquis, hold due to her anemia and monitor.   Acute metabolic encephalopathy  Initial lethargy due to hypotension and hypoperfusion, due to poor respiratory effort developed some hypercapnia, CT head unremarkable, overnight BiPAP on 04/09/2023, mentation much improved, monitor with supportive care.   AKI.  Stable and resolved after transfusion,  Hyperlipidemia -Continue statin   GERD  - PPI IV  Diabetes mellitus type 2, without long-term use of insulin ISS   Lab Results  Component Value Date   HGBA1C 6.1 (H) 04/10/2023   CBG (last 3)  Recent Labs    04/18/23 2039 04/19/23 0827 04/19/23 1217  GLUCAP 130* 87 94            Condition - Extremely Guarded  Family Communication  : Daughter bedside daily  Code Status : Full code  Consults  : GI, palliative care for goals of care  PUD Prophylaxis : PPI   Procedures  :     EGD due 04/13/2023.    CT abdomen pelvis.  1. No evidence of retroperitoneal hematoma. 2. Small right pleural effusion with atelectasis or infiltrate at the lung bases. 3. Anasarca. 4. Cholelithiasis. 5. Diverticulosis without diverticulitis. 6. Periumbilical hernia to the right of midline containing fat and ascites. 7. Aortic atherosclerosis and coronary artery calcifications.   Echocardiogram.  1. No significant change from echo done in NOv 2022.  2. Left ventricular ejection fraction, by estimation, is 70 to 75%. The left ventricle has hyperdynamic function. The left ventricle has no regional wall motion abnormalities. There is mild left ventricular hypertrophy. Left ventricular diastolic  parameters are consistent with Grade II diastolic dysfunction (pseudonormalization). Elevated left atrial pressure.  3. Right ventricular systolic function is mildly reduced. The right ventricular size is mildly  enlarged.  4. Left atrial size was mildly dilated.  5. The mitral valve is normal in structure. Mild mitral valve regurgitation.  6. The aortic valve is normal in structure. Aortic valve regurgitation is not visualized.  7. The inferior vena cava is dilated in size with <50% respiratory variability, suggesting right atrial pressure of 15 mmHg  CT head.  Nonacute.      Disposition Plan  :    Status is: Inpatient  DVT Prophylaxis  :  SCDs    Lab Results  Component Value Date   PLT 85 (L) 04/19/2023    Diet :  Diet Order             DIET DYS 3 Room service appropriate? Yes; Fluid consistency: Thin  Diet effective now                    Inpatient Medications  Scheduled Meds:  vitamin B-12  500 mcg Oral Daily   fluticasone  2 spray Each Nare Daily   furosemide  40 mg Intravenous BID   gabapentin  100 mg Oral QHS   insulin aspart  0-9 Units Subcutaneous TID WC   metoprolol succinate  50 mg Oral Daily   pantoprazole  40 mg Oral BID   Followed by   Melene Muller ON 05/09/2023] pantoprazole  40 mg Oral Daily   rosuvastatin  20 mg Oral QPM   sodium chloride flush  3 mL Intravenous Q12H   Continuous Infusions:  sodium chloride 10 mL/hr at 04/10/23 1427   lactated ringers 10 mL/hr at 04/13/23 1112   PRN Meds:.sodium chloride, acetaminophen **OR** acetaminophen, albuterol, docusate sodium, guaiFENesin, lidocaine, naphazoline-pheniramine, ondansetron (ZOFRAN) IV, polyethylene glycol, sodium chloride    Objective:   Vitals:   04/19/23 0800 04/19/23 1000 04/19/23 1200 04/19/23 1300  BP:      Pulse:      Resp:      Temp: 97.9 F (36.6 C)  (!) 97.3 F (36.3 C)   TempSrc: Oral  Oral   SpO2:  95%  94%  Weight:        Wt Readings from Last 3 Encounters:  04/17/23 94.2  kg  01/12/23 95.3 kg  10/27/22 95.3 kg     Intake/Output Summary (Last 24 hours) at 04/19/2023 1518 Last data filed at 04/19/2023 1200 Gross per 24 hour  Intake 660 ml  Output --  Net 660 ml     Physical Exam  Awake Alert, Oriented X 3, frail Symmetrical Chest wall movement, Good air movement bilaterally, bibasilar crackles RRR,No Gallops,Rubs or new Murmurs, No Parasternal Heave +ve B.Sounds, Abd Soft, No tenderness, No rebound - guarding or rigidity. No Cyanosis, Clubbing or edema, No new Rash or bruise           Data Review:    Recent Labs  Lab 04/13/23 0116 04/14/23 0125 04/15/23 0310 04/16/23 0616 04/16/23 1742 04/17/23 1813 04/18/23 0855 04/19/23 0407  WBC 6.9 6.7 7.1  --   --  10.4 9.2 8.6  HGB 7.8* 7.8* 7.5* 8.0* 8.5* 8.7* 8.7* 8.5*  HCT 29.0* 29.5* 27.8* 29.3* 31.5* 33.1* 33.1* 32.4*  PLT 87* 86* 84*  --   --  78* 86* 85*  MCV 83.6 84.0 84.2  --   --  86.0 88.0 85.9  MCH 22.5* 22.2* 22.7*  --   --  22.6* 23.1* 22.5*  MCHC 26.9* 26.4* 27.0*  --   --  26.3* 26.3* 26.2*  RDW 22.0* 22.7* 22.6*  --   --  23.0* 23.1* 23.2*  LYMPHSABS 0.6* 0.6* 0.5*  --   --   --  0.6*  --   MONOABS 0.9 1.0 0.9  --   --   --  1.0  --   EOSABS 0.1 0.2 0.2  --   --   --  0.3  --   BASOSABS 0.0 0.0 0.0  --   --   --  0.0  --     Recent Labs  Lab 04/13/23 0116 04/14/23 0125 04/15/23 0310 04/16/23 1742 04/17/23 1813 04/18/23 0451 04/18/23 0855 04/19/23 0407  NA 139 139 139 141 144 140  --  140  K 4.5 4.5 4.2 3.7 4.0 4.0  --  4.0  CL 97* 97* 97* 97* 98 97*  --  95*  CO2 35* 36* 35* 37* 40* 39*  --  38*  ANIONGAP 7 6 7 7 6  4*  --  7  GLUCOSE 134* 96 89 111* 118* 87  --  91  BUN 22 14 12 13 17 16   --  23  CREATININE 0.98 0.73 0.78 0.77 0.77 0.87  --  1.05*  CRP 2.5* 4.1*  --   --   --   --   --   --   PROCALCITON <0.10 <0.10 <0.10  --   --   --   --   --   BNP 819.2* 1,263.0*  --   --  1,088.7*  --  1,613.0* 993.0*  MG 2.1 2.0  --   --   --   --   --   --   CALCIUM  9.6 9.7 9.8 9.9 10.1 10.0  --  9.8      Recent Labs  Lab 04/13/23 0116 04/14/23 0125 04/15/23 0310 04/16/23 1742 04/17/23 1813 04/18/23 0451 04/18/23 0855 04/19/23 0407  CRP 2.5* 4.1*  --   --   --   --   --   --   PROCALCITON <0.10 <0.10 <0.10  --   --   --   --   --   BNP 819.2* 1,263.0*  --   --  1,088.7*  --  1,613.0* 993.0*  MG 2.1 2.0  --   --   --   --   --   --   CALCIUM 9.6 9.7 9.8 9.9 10.1 10.0  --  9.8    Recent Labs  Lab 04/13/23 0116 04/14/23 0125 04/15/23 0310 04/16/23 1742 04/17/23 1813 04/18/23 0451 04/18/23 0855 04/19/23 0407  WBC 6.9 6.7 7.1  --  10.4  --  9.2 8.6  PLT 87* 86* 84*  --  78*  --  86* 85*  CRP 2.5* 4.1*  --   --   --   --   --   --   PROCALCITON <0.10 <0.10 <0.10  --   --   --   --   --   CREATININE 0.98 0.73 0.78 0.77 0.77 0.87  --  1.05*   No results for input(s): "TSH", "T4TOTAL", "T3FREE", "THYROIDAB" in the last 72 hours.  Invalid input(s): "FREET3"     Micro Results No results found for this or any previous visit (from the past 240 hour(s)).   Radiology Reports DG Chest Port 1 View  Result Date: 04/17/2023 CLINICAL DATA:  Bradycardia.  Dyspnea. EXAM: PORTABLE CHEST 1 VIEW COMPARISON:  One-view chest x-ray 04/16/2023 FINDINGS: The heart is enlarged. Chronic elevation the right hemidiaphragm is noted. Diffuse interstitial pattern is increasing. Bibasilar linear opacities likely reflect  atelectasis. Advanced degenerative changes are present in the shoulders bilaterally. Cervical spine fusion is noted. IMPRESSION: 1. Cardiomegaly and increasing interstitial pattern compatible with congestive heart failure. Electronically Signed   By: Marin Roberts M.D.   On: 04/17/2023 14:14   DG Chest Port 1 View  Result Date: 04/16/2023 CLINICAL DATA:  Hypoxia.  Hypercapnia.  Difficulty breathing. EXAM: PORTABLE CHEST 1 VIEW COMPARISON:  04/11/2023. FINDINGS: Redemonstration of chronic interstitial prominence, similar to the prior study.  No acute consolidation or major lung collapse. Note is again made of elevated right hemidiaphragm. There are atelectatic changes at the right lung base. Bilateral lateral costophrenic angle are clear. Stable cardio-mediastinal silhouette. There are surgical staples along the heart border and sternotomy wires, status post CABG (coronary artery bypass graft). No acute osseous abnormalities. Cervical spinal fixation hardware is seen. The soft tissues are within normal limits. IMPRESSION: 1. Chronic interstitial prominence. No acute consolidation or major lung collapse. Electronically Signed   By: Jules Schick M.D.   On: 04/16/2023 10:29      Signature  -   Huey Bienenstock M.D on 04/19/2023 at 3:18 PM   -  To page go to [email or web address]

## 2023-04-19 NOTE — Care Management Important Message (Signed)
Important Message  Patient Details  Name: Erika Gates MRN: [id number] Date of Birth: [birth year]   Medicare Important Message Given:  Yes IM given on August 23.     Ginnie Marich 04/19/2023, 11:44 AM

## 2023-04-19 NOTE — Plan of Care (Signed)
Patient still remains weak but tries to get up to the chair. She is more fatigued today after taking different sleep aid last night

## 2023-04-19 NOTE — Care Management Important Message (Signed)
Important Message  Patient Details  Name: Erika Gates MRN: [id number] Date of Birth: 01/31/1935   Medicare Important Message Given:        Dorena Bodo 04/19/2023, 11:44 AM

## 2023-04-19 NOTE — Progress Notes (Addendum)
Physical Therapy Treatment Patient Details Name: Erika Gates MRN: [id number] DOB: 1935-05-25 Today's Date: 04/19/2023   History of Present Illness 88 y.o. female presented to ED 04/09/23 due to lethargy. +severe anemia, bradycardia and hypothermia, acute respiratory failure requiring BiPAP; +GI bleed;   PMH significant of hypertension, hyperlipidemia, paroxysmal atrial fibrillation, CAD s/p CABG in 1996, diastolic CHF, neurologic myopathy with quadriparesis requiring cervical decompression 07/2022, diabetes mellitus type 2, and morbid obesity    PT Comments  Pt seen for PT tx with [name]) present for session and still hopeful to take pt home. Pt awakens & agrees to participate. Pt requires mod assist +1-2 for bed mobility with hospital bed features. Pt attempts STS with RW & HHA with +2 assist but pt with BLE knee A/P instability & 1 episode of R knee buckling & PT assisted pt with safely sitting on EOB. Pt unable to take steps to recliner on L but able to scoot along EOB to L with mod assist with extra time & rest breaks 2/2 fatigue & SOB. Pt left in bed with all needs in reach.  Addendum: Pt with RUE muscle fasciculations noted during session that this PT did not notice during past PT session. Pt also leaning to R when sitting EOB, daughter reports bone on bone in pt's knees.   If plan is discharge home, recommend the following: Assistance with cooking/housework;Assist for transportation;Help with stairs or ramp for entrance;Two people to help with walking and/or transfers   Can travel by private vehicle     No  Equipment Recommendations  Hospital bed;Other (comment) (hoyer lift)    Recommendations for Other Services       Precautions / Restrictions Precautions Precautions: Fall Precaution Comments: watch HR, O2 Restrictions Weight Bearing Restrictions: No     Mobility  Bed Mobility Overal bed mobility: Needs Assistance Bed Mobility: Supine to Sit Rolling: Mod assist,  Used rails (roll R)   Supine to sit: Mod assist, +2 for physical assistance, Used rails, HOB elevated Sit to supine: Mod assist, Used rails, HOB elevated   General bed mobility comments: +2 to scoot to [facility]    Transfers Overall transfer level: Needs assistance Equipment used: Rolling walker (2 wheels), 2 person hand held assist Transfers: Sit to/from Stand Sit to Stand: Min assist, Mod assist           General transfer comment: STS from EOB with RW then with BUE HHA +2, pt is able to power up to standing but unable to take steps to stand pivot bed>recliner on L. Pt with BLE A/P instability with 1 episode of knee buckling & assistance to safely sit on EOB.    Ambulation/Gait                   Stairs             Wheelchair Mobility     Tilt Bed    Modified Rankin (Stroke Patients Only)       Balance Overall balance assessment: Needs assistance Sitting-balance support: No upper extremity supported, Feet supported Sitting balance-Leahy Scale: Poor     Standing balance support: Bilateral upper extremity supported, During functional activity, Reliant on assistive device for balance Standing balance-Leahy Scale: Poor                              Cognition Arousal: Lethargic (possibly 2/2 medications as daughter reports pt had meds last night to help  her rest) Behavior During Therapy: WFL for tasks assessed/performed Overall Cognitive Status: Impaired/Different from baseline Area of Impairment: Problem solving, Awareness                         Safety/Judgement: Decreased awareness of deficits, Decreased awareness of safety Awareness: Emergent Problem Solving: Slow processing, Requires verbal cues, Requires tactile cues, Decreased initiation          Exercises General Exercises - Lower Extremity Long Arc Quad: AROM, Seated, Left, Both, 5 reps    General Comments General comments (skin integrity, edema, etc.): pt on 3L/min via  nasal cannula with pt requiring rest breaks to recover from feeling SOB with mobility.      Pertinent Vitals/Pain Pain Assessment Pain Assessment: Faces Faces Pain Scale: Hurts a little bit Pain Location: neck 2/2 sleeping in uncomfortable position Pain Descriptors / Indicators: Discomfort, Grimacing, Guarding Pain Intervention(s): Monitored during session    Home Living                          Prior Function            PT Goals (current goals can now be found in the care plan section) Acute Rehab PT Goals Patient Stated Goal: take pt home PT Goal Formulation: With family Time For Goal Achievement: 04/24/23 Potential to Achieve Goals: Fair Progress towards PT goals: Progressing toward goals    Frequency    Min 1X/week      PT Plan      Co-evaluation              AM-PAC PT "6 Clicks" Mobility   Outcome Measure  Help needed turning from your back to your side while in a flat bed without using bedrails?: A Lot Help needed moving from lying on your back to sitting on the side of a flat bed without using bedrails?: Total Help needed moving to and from a bed to a chair (including a wheelchair)?: Total Help needed standing up from a chair using your arms (e.g., wheelchair or bedside chair)?: Total Help needed to walk in hospital room?: Total Help needed climbing 3-5 steps with a railing? : Total 6 Click Score: 7    End of Session Equipment Utilized During Treatment: Oxygen Activity Tolerance: Patient tolerated treatment well Patient left: in bed;with call bell/phone within reach;with bed alarm set Nurse Communication: Mobility status PT Visit Diagnosis: Other abnormalities of gait and mobility (R26.89);Muscle weakness (generalized) (M62.81);Difficulty in walking, not elsewhere classified (R26.2)     Time: 9323-5573 PT Time Calculation (min) (ACUTE ONLY): 32 min  Charges:    $Therapeutic Activity: 23-37 mins PT General Charges $$ ACUTE PT  VISIT: 1 Visit                     Erika Gates, PT, DPT 04/19/23, 9:04 AM   Erika Gates 04/19/2023, 8:54 AM

## 2023-04-20 DIAGNOSIS — J9602 Acute respiratory failure with hypercapnia: Secondary | ICD-10-CM | POA: Diagnosis not present

## 2023-04-20 DIAGNOSIS — J9601 Acute respiratory failure with hypoxia: Secondary | ICD-10-CM | POA: Diagnosis not present

## 2023-04-20 LAB — BASIC METABOLIC PANEL
Anion gap: 8 (ref 5–15)
BUN: 22 mg/dL (ref 8–23)
CO2: 38 mmol/L — ABNORMAL HIGH (ref 22–32)
Calcium: 9.7 mg/dL (ref 8.9–10.3)
Chloride: 93 mmol/L — ABNORMAL LOW (ref 98–111)
Creatinine, Ser: 0.84 mg/dL (ref 0.44–1.00)
GFR, Estimated: >60 mL/min (ref >60)
Glucose, Bld: 119 mg/dL — ABNORMAL HIGH (ref 70–99)
Potassium: 3.7 mmol/L (ref 3.5–5.1)
Sodium: 139 mmol/L (ref 135–145)

## 2023-04-20 LAB — CBC
HCT: 32.1 % — ABNORMAL LOW (ref 36.0–46.0)
Hemoglobin: 8.7 g/dL — ABNORMAL LOW (ref 12.0–15.0)
MCH: 23.6 pg — ABNORMAL LOW (ref 26.0–34.0)
MCHC: 27.1 g/dL — ABNORMAL LOW (ref 30.0–36.0)
MCV: 87 fL (ref 80.0–100.0)
Platelets: 100 10*3/uL — ABNORMAL LOW (ref 150–400)
RBC: 3.69 MIL/uL — ABNORMAL LOW (ref 3.87–5.11)
RDW: 23.5 % — ABNORMAL HIGH (ref 11.5–15.5)
WBC: 7 10*3/uL (ref 4.0–10.5)
nRBC: 0 % (ref 0.0–0.2)

## 2023-04-20 LAB — GLUCOSE, CAPILLARY
Glucose-Capillary: 93 mg/dL (ref 70–99)
Glucose-Capillary: 102 mg/dL — ABNORMAL HIGH (ref 70–99)
Glucose-Capillary: 119 mg/dL — ABNORMAL HIGH (ref 70–99)

## 2023-04-20 MED ORDER — APIXABAN 5 MG PO TABS
5.0000 mg | ORAL_TABLET | Freq: Two times a day (BID) | ORAL | Status: DC
  Administered 2023-04-20 – 2023-04-22 (×5): 5 mg via ORAL
  Filled 2023-04-20 (×5): qty 1

## 2023-04-20 MED ORDER — FUROSEMIDE 10 MG/ML IJ SOLN
40.0000 mg | Freq: Every day | INTRAMUSCULAR | Status: DC
  Administered 2023-04-20: 40 mg via INTRAVENOUS
  Filled 2023-04-20: qty 4

## 2023-04-20 MED ORDER — FUROSEMIDE 10 MG/ML IJ SOLN
40.0000 mg | Freq: Every day | INTRAMUSCULAR | Status: DC
  Administered 2023-04-21: 40 mg via INTRAVENOUS
  Filled 2023-04-20: qty 4

## 2023-04-20 MED ORDER — DICLOFENAC SODIUM 1 % EX GEL
2.0000 g | Freq: Four times a day (QID) | CUTANEOUS | Status: DC | PRN
  Filled 2023-04-20: qty 100

## 2023-04-20 NOTE — TOC Progression Note (Addendum)
Transition of Care [facility]) - Progression Note    Patient Details  Name: Erika Gates MRN: [id number] Date of Birth: June 22, 1935  Transition of Care [facility]) CM/SW Contact  Mearl Latin, LCSW Phone Number: 04/20/2023, 12:48 PM  Clinical Narrative:    Per MD, patient will require SNF placement as insurance denied CIR. CSW will work on referrals.   CSW met with patient's daughter and provided SNF Medicare ratings list. She requested CSW reach out to [facility]. CSW discussed insurance authorization process and will follow up on response from [facility].   Update: CSW made patient's daughter aware that [facility] can accept patient. She would like to look at one more facility prior to deciding.  CSW received call from patient's grandson, Jackelyn Hoehn. He and daughter would like to tour [facility] as well. He is scheduled to tour [facility] at 8am tomorrow. CSW spoke with [facility] and provided contact info to schedule a tour tomorrow. CSW will initiate insurance authorization process pending a facility choice, Ref [id number].  Skilled Nursing Rehab Facilities-   [email or web address]   Ratings out of 5 stars (5 the highest)   Name Address  Phone # Quality Care Staffing Health Inspection Overall  [facility] [address] [place] [phone] 2 1 5 4   [facility] [address] [place] [phone] 4 1 3 2   [facility] [address], [facility] [phone] 2 1 1 1   [facility] [address] [place] [phone] 4 1 3 2   [facility]  [address], [place] [phone] 3 2 5 5   [facility] [address] [facility] [phone] 2 1 2 1   [facility] [address] [place] [phone] 4 1 2 1   [facility] [phone] [address], [place] [phone] 2 4 3 3   [address] ([place]) [address] [place] [phone] 3 2 2 2   [facility] [address] [place], [place] [phone] 1  2 1 1   [facility] [address], [place] [phone] 3 1 1 1   [facility]igh[facility] [address] [place]iane [place] [phone] 4 3 4 4   [facility] [address], [place] [phone] 3 4 3 3           [facility] [address], [place] [phone]      [facility], [address] [place] [address], [place] [phone] 1 1 2 1   [facility] [address] [place] [phone] 2 2 4 4   [facility]  [address] [phone] 2 1 4 3   [facility] [address], [place] [phone] 3 3 3 3           [address] (no [facility]) [address], [place] [phone] 4 4 5 5   [facility] (No [employer]) [address] Lavera Guise [phone] 2 2 4 4   [facility] [address], [place] [phone] 2 1 2 1   [facility] (No [employer]HC) [address], [place] [phone] 5 5 5 5   [facility] [address], [facility] [phone] 2 3 5 5   [facility] [address] [place] [phone] 2 1 1 1   [facility] [address] Liliane Shi [phone] 5 2 5 5   [facility]  [address] [place] [phone] 2 2 2 2   [facility] [address] [place] [phone] 4 2 1 1   [facility] [address] [place], [place] [phone] 2 2 3  3  [facility] [address] [place] [phone] 1 1 1 1   [facility] [address], Evlyn Clines  [phone] 2 3 3 3   [facility] (No Humana) [address], [place] [phone] 2 1 3 2   [facility]) [address], [place] Levan [phone] 1 1 1 1   [facility] [address] [place] [phone] 3 2 5 5   [facility] [place] [address], [place] [phone] 2 1 1 1           [facility] [address] [place], [place] [phone] 4 4 5 5   [facility])  [address], [place] [phone] 2 1 2 1   [facility])  [address], [place] [phone]  1 4 3   [facility] [address], [place] [phone] 3 5 4 5   [address] [place], [place] [phone] 3 2 2 2   [facility]) [address] [place] [phone] 2 1 3 2       Expected Discharge Plan: Skilled Nursing Facility Barriers to Discharge: Insurance Authorization, Continued Medical Work up  Expected Discharge Plan and Services       Living arrangements for the past 2 months: Single Family Home                                       Social Determinants of Health (SDOH) Interventions SDOH Screenings   Food Insecurity: No Food Insecurity (04/09/2023)  Housing: Low Risk  (04/09/2023)  Transportation Needs: No Transportation Needs (04/09/2023)  Utilities: Not At Risk (04/09/2023)  Financial Resource Strain: Low Risk  (08/27/2022)   [facility] from [facility], [facility]  Tobacco Use: Low Risk  (01/12/2023)  Health Literacy: Adequate Health Literacy (08/27/2022)   Received from [facility], [facility]    Readmission Risk Interventions     No data to display

## 2023-04-20 NOTE — Plan of Care (Signed)

## 2023-04-20 NOTE — Progress Notes (Signed)
PROGRESS NOTE                                                                                                                                                                                                             Patient Demographics:    Erika Gates, is a 88 y.o. female, DOB - Mar 13, 1935, JXB:[id number]  Outpatient Primary MD for the patient is [name], MD    LOS - 11  Admit date - 04/09/2023    Chief Complaint  Patient presents with   Bradycardia   Fatigue       Brief Narrative (HPI from H&P)     -88 y.o. female with medical history significant of hypertension, hyperlipidemia, paroxysmal atrial fibrillation, CAD s/p CABG in 1996, diastolic CHF, neurologic myopathy with quadriparesis requiring cervical decompression 07/2022, diabetes mellitus type 2, and morbid obesity who presented from home after being noted to be more lethargic last night.  Workup in the ER suggestive of severe anemia, bradycardia and hypothermia, she also had mild hypercapnia, she was given packed RBC transfusion, her Eliquis was held, she was placed on PPI and BiPAP, GI was consulted and she was admitted to the hospital for further care -Dyspnea secondary to volume overload in setting of acute on chronic diastolic CHF, and anemia, endoscopy significant for gastritis, duodenopathy and nonbleeding duodenal ulcers, treated with Protonix holding Eliquis x 1 week, she developed worsening hypoxia, worsening respiratory failure due to volume overload from blood transfusion, required aggressive IV diuresis, volume status much improved, awaiting SNF placement.   Subjective:   No significant events overnight, she did not require CPAP, mentation much improved today after holding trazodone    Assessment  & Plan :     Acute respiratory failure with hypoxia and hypercapnia secondary to acute on chronic diastolic congestive heart failure precipitated by  severe anemia, hypercapnia worsened by poor respiratory effort due to hypoperfusion caused by severe anemia and poor respiratory effort. - Diuresed with Lasix, overnight BiPAP, mentation much improved, breathing much improved, transition to oxygen, up in chair in daytime if possible, I-S flutter valve for pulmonary toiletry, as needed Lasix, has been transfused 2 units of packed RBC with good improvement, continue to monitor. -4 L nasal cannula this morning, was able to lowered to 2 L oxygen, she was encouraged again to use incentive spirometer  Acute  on chronic diastolic CHF -Mains with significant dyspnea, significant evidence of volume overload as well, requiring as needed CPAP. -BNP remains significantly elevated, volume status started to improve, but significantly volume overloaded, continue with 40 mg of IV Lasix twice daily, daily weights, strict ins and out, monitor BMP closely. -Respiratory status improving, BNP trending down, continue with IV Lasix, will decrease to once daily from twice daily  Acute blood loss anemia secondary to GI bleed  - Stool Hemoccult positive, was on Eliquis, Eliquis on hold, IV PPI, s/p 2 units packed RBC, posttransfusion H&H stable, no frank melena or bright red blood per rectum by [facility] - family refused invasive procedures and no EGD, H&H stable post PRBCs, family initially hesitant for EGD now has agreed EGD by Dr. Dulce Sellar on 04/13/2023. -This post endoscopy 8/28 by Dr. Dulce Sellar, significant for erosive gastritis, duodenopathy with nonbleeding duodenal ulcer, continue with Protonix 40 mg p.o. twice daily, hold Eliquis x 1 week. -Hemoglobin 7.5 this morning, will give another unit PRBC, given her frailty, and she needs to go back on Eliquis and less monitored environment in the outpatient setting next week -treated with IV iron x 3 doses -Hemoglobin remained stable, will start on Eliquis tomorrow.  A-fib with RVR -heart rate started to increase today in the 120s,  will resume metoprolol XL, at a lower dose 50 mg given she presented with bradycardia (home dose was 100)   Sinus bradycardia - With EMS heart rates were reported to be in the 30s, was on high-dose beta-blocker which has been held.  Heart rate improving continue to monitor.  TSH borderline suggestive of sick thyroid. -Now heart rate started to increase so resumed back on Toprol-XL gradually.  Transient hypotension  Due to combination of severe anemia and bradycardia, much improved after supportive care, now heart rate started to increase ,back on Toprol-XL gradually.  UTI.  No sepsis.  Empiric Rocephin follow cultures.   Pancytopenia  Acute on chronic.  Monitor.  Packed RBCs transfused.   Paroxysmal atrial fibrillation on chronic anticoagulation  Not on rate controlling agents currently, was on Eliquis, hold due to her anemia and monitor.   Acute metabolic encephalopathy  Initial lethargy due to hypotension and hypoperfusion, due to poor respiratory effort developed some hypercapnia, CT head unremarkable, overnight BiPAP on 04/09/2023, mentation much improved, monitor with supportive care.   AKI.  Stable and resolved after transfusion,  Hyperlipidemia -Continue statin   GERD  - PPI IV  Diabetes mellitus type 2, without long-term use of insulin ISS   Lab Results  Component Value Date   HGBA1C 6.1 (H) 04/10/2023   CBG (last 3)  Recent Labs    04/19/23 2140 04/20/23 0835 04/20/23 1210  GLUCAP 137* 93 102*            Condition - Extremely Guarded  Family Communication  : Daughter bedside daily  Code Status : Full code  Consults  : GI, palliative care for goals of care  PUD Prophylaxis : PPI   Procedures  :     EGD due 04/13/2023.    CT abdomen pelvis.  1. No evidence of retroperitoneal hematoma. 2. Small right pleural effusion with atelectasis or infiltrate at the lung bases. 3. Anasarca. 4. Cholelithiasis. 5. Diverticulosis without diverticulitis. 6. Periumbilical  hernia to the right of midline containing fat and ascites. 7. Aortic atherosclerosis and coronary artery calcifications.   Echocardiogram.  1. No significant change from echo done in NOv 2022.  2. Left ventricular ejection  fraction, by estimation, is 70 to 75%. The left ventricle has hyperdynamic function. The left ventricle has no regional wall motion abnormalities. There is mild left ventricular hypertrophy. Left ventricular diastolic parameters are consistent with Grade II diastolic dysfunction (pseudonormalization). Elevated left atrial pressure.  3. Right ventricular systolic function is mildly reduced. The right ventricular size is mildly enlarged.  4. Left atrial size was mildly dilated.  5. The mitral valve is normal in structure. Mild mitral valve regurgitation.  6. The aortic valve is normal in structure. Aortic valve regurgitation is not visualized.  7. The inferior vena cava is dilated in size with <50% respiratory variability, suggesting right atrial pressure of 15 mmHg  CT head.  Nonacute.      Disposition Plan  :    Status is: Inpatient  DVT Prophylaxis  :  SCDs    Lab Results  Component Value Date   PLT 100 (L) 04/20/2023    Diet :  Diet Order             DIET DYS 3 Room service appropriate? Yes; Fluid consistency: Thin  Diet effective now                    Inpatient Medications  Scheduled Meds:  apixaban  5 mg Oral BID   vitamin B-12  500 mcg Oral Daily   fluticasone  2 spray Each Nare Daily   furosemide  40 mg Intravenous Daily   gabapentin  100 mg Oral QHS   metoprolol succinate  50 mg Oral Daily   pantoprazole  40 mg Oral BID   Followed by   Melene Muller ON 05/09/2023] pantoprazole  40 mg Oral Daily   rosuvastatin  20 mg Oral QPM   sodium chloride flush  3 mL Intravenous Q12H   Continuous Infusions:  sodium chloride 10 mL/hr at 04/10/23 1427   lactated ringers 10 mL/hr at 04/13/23 1112   PRN Meds:.sodium chloride, acetaminophen **OR** acetaminophen,  albuterol, docusate sodium, guaiFENesin, lidocaine, naphazoline-pheniramine, ondansetron (ZOFRAN) IV, polyethylene glycol, sodium chloride    Objective:   Vitals:   04/20/23 0000 04/20/23 0100 04/20/23 0400 04/20/23 0500  BP: (!) 143/69  (!) 160/72   Pulse: 84  81   Resp: 17  20   Temp: 97.8 F (36.6 C) 98.5 F (36.9 C)    TempSrc: Oral Oral    SpO2: 92%  95%   Weight:    95.7 kg    Wt Readings from Last 3 Encounters:  04/20/23 95.7 kg  01/12/23 95.3 kg  10/27/22 95.3 kg     Intake/Output Summary (Last 24 hours) at 04/20/2023 1550 Last data filed at 04/20/2023 1456 Gross per 24 hour  Intake 340 ml  Output 2600 ml  Net -2260 ml     Physical Exam  Awake Alert, Oriented X 3, frail Symmetrical Chest wall movement, Good air movement bilaterally, bibasilar crackles RRR,No Gallops,Rubs or new Murmurs, No Parasternal Heave +ve B.Sounds, Abd Soft, No tenderness, No rebound - guarding or rigidity. No Cyanosis, Clubbing or edema, No new Rash or bruise           Data Review:    Recent Labs  Lab 04/14/23 0125 04/15/23 0310 04/16/23 0616 04/16/23 1742 04/17/23 1813 04/18/23 0855 04/19/23 0407 04/20/23 1017  WBC 6.7 7.1  --   --  10.4 9.2 8.6 7.0  HGB 7.8* 7.5*   < > 8.5* 8.7* 8.7* 8.5* 8.7*  HCT 29.5* 27.8*   < > 31.5* 33.1* 33.1*  32.4* 32.1*  PLT 86* 84*  --   --  78* 86* 85* 100*  MCV 84.0 84.2  --   --  86.0 88.0 85.9 87.0  MCH 22.2* 22.7*  --   --  22.6* 23.1* 22.5* 23.6*  MCHC 26.4* 27.0*  --   --  26.3* 26.3* 26.2* 27.1*  RDW 22.7* 22.6*  --   --  23.0* 23.1* 23.2* 23.5*  LYMPHSABS 0.6* 0.5*  --   --   --  0.6*  --   --   MONOABS 1.0 0.9  --   --   --  1.0  --   --   EOSABS 0.2 0.2  --   --   --  0.3  --   --   BASOSABS 0.0 0.0  --   --   --  0.0  --   --    < > = values in this interval not displayed.    Recent Labs  Lab 04/14/23 0125 04/15/23 0310 04/16/23 1742 04/17/23 1813 04/18/23 0451 04/18/23 0855 04/19/23 0407 04/20/23 1017  NA 139 139  141 144 140  --  140 139  K 4.5 4.2 3.7 4.0 4.0  --  4.0 3.7  CL 97* 97* 97* 98 97*  --  95* 93*  CO2 36* 35* 37* 40* 39*  --  38* 38*  ANIONGAP 6 7 7 6  4*  --  7 8  GLUCOSE 96 89 111* 118* 87  --  91 119*  BUN 14 12 13 17 16   --  23 22  CREATININE 0.73 0.78 0.77 0.77 0.87  --  1.05* 0.84  CRP 4.1*  --   --   --   --   --   --   --   PROCALCITON <0.10 <0.10  --   --   --   --   --   --   BNP 1,263.0*  --   --  1,088.7*  --  1,613.0* 993.0*  --   MG 2.0  --   --   --   --   --   --   --   CALCIUM 9.7 9.8 9.9 10.1 10.0  --  9.8 9.7      Recent Labs  Lab 04/14/23 0125 04/15/23 0310 04/16/23 1742 04/17/23 1813 04/18/23 0451 04/18/23 0855 04/19/23 0407 04/20/23 1017  CRP 4.1*  --   --   --   --   --   --   --   PROCALCITON <0.10 <0.10  --   --   --   --   --   --   BNP 1,263.0*  --   --  1,088.7*  --  1,613.0* 993.0*  --   MG 2.0  --   --   --   --   --   --   --   CALCIUM 9.7 9.8 9.9 10.1 10.0  --  9.8 9.7    Recent Labs  Lab 04/14/23 0125 04/15/23 0310 04/16/23 1742 04/17/23 1813 04/18/23 0451 04/18/23 0855 04/19/23 0407 04/20/23 1017  WBC 6.7 7.1  --  10.4  --  9.2 8.6 7.0  PLT 86* 84*  --  78*  --  86* 85* 100*  CRP 4.1*  --   --   --   --   --   --   --   PROCALCITON <0.10 <0.10  --   --   --   --   --   --  CREATININE 0.73 0.78 0.77 0.77 0.87  --  1.05* 0.84   No results for input(s): "TSH", "T4TOTAL", "T3FREE", "THYROIDAB" in the last 72 hours.  Invalid input(s): "FREET3"     Micro Results No results found for this or any previous visit (from the past 240 hour(s)).   Radiology Reports DG Chest Port 1 View  Result Date: 04/17/2023 CLINICAL DATA:  Bradycardia.  Dyspnea. EXAM: PORTABLE CHEST 1 VIEW COMPARISON:  One-view chest x-ray 04/16/2023 FINDINGS: The heart is enlarged. Chronic elevation the right hemidiaphragm is noted. Diffuse interstitial pattern is increasing. Bibasilar linear opacities likely reflect atelectasis. Advanced degenerative changes  are present in the shoulders bilaterally. Cervical spine fusion is noted. IMPRESSION: 1. Cardiomegaly and increasing interstitial pattern compatible with congestive heart failure. Electronically Signed   By: Marin Roberts M.D.   On: 04/17/2023 14:14      Signature  -   Huey Bienenstock M.D on 04/20/2023 at 3:50 PM   -  To page go to [email or web address]

## 2023-04-21 DIAGNOSIS — J9601 Acute respiratory failure with hypoxia: Secondary | ICD-10-CM | POA: Diagnosis not present

## 2023-04-21 DIAGNOSIS — I5031 Acute diastolic (congestive) heart failure: Secondary | ICD-10-CM

## 2023-04-21 DIAGNOSIS — R001 Bradycardia, unspecified: Secondary | ICD-10-CM | POA: Diagnosis not present

## 2023-04-21 DIAGNOSIS — G9341 Metabolic encephalopathy: Secondary | ICD-10-CM | POA: Diagnosis not present

## 2023-04-21 DIAGNOSIS — D62 Acute posthemorrhagic anemia: Secondary | ICD-10-CM | POA: Diagnosis not present

## 2023-04-21 LAB — CBC
HCT: 32.2 % — ABNORMAL LOW (ref 36.0–46.0)
Hemoglobin: 8.5 g/dL — ABNORMAL LOW (ref 12.0–15.0)
MCH: 22.8 pg — ABNORMAL LOW (ref 26.0–34.0)
MCHC: 26.4 g/dL — ABNORMAL LOW (ref 30.0–36.0)
MCV: 86.6 fL (ref 80.0–100.0)
Platelets: 113 10*3/uL — ABNORMAL LOW (ref 150–400)
RBC: 3.72 MIL/uL — ABNORMAL LOW (ref 3.87–5.11)
RDW: 23.4 % — ABNORMAL HIGH (ref 11.5–15.5)
WBC: 6.2 10*3/uL (ref 4.0–10.5)
nRBC: 0 % (ref 0.0–0.2)

## 2023-04-21 LAB — BASIC METABOLIC PANEL
Anion gap: 7 (ref 5–15)
BUN: 20 mg/dL (ref 8–23)
CO2: 41 mmol/L — ABNORMAL HIGH (ref 22–32)
Calcium: 9.8 mg/dL (ref 8.9–10.3)
Chloride: 92 mmol/L — ABNORMAL LOW (ref 98–111)
Creatinine, Ser: 0.85 mg/dL (ref 0.44–1.00)
GFR, Estimated: >60 mL/min (ref >60)
Glucose, Bld: 134 mg/dL — ABNORMAL HIGH (ref 70–99)
Potassium: 3.5 mmol/L (ref 3.5–5.1)
Sodium: 140 mmol/L (ref 135–145)

## 2023-04-21 LAB — GLUCOSE, CAPILLARY
Glucose-Capillary: 91 mg/dL (ref 70–99)
Glucose-Capillary: 100 mg/dL — ABNORMAL HIGH (ref 70–99)
Glucose-Capillary: 121 mg/dL — ABNORMAL HIGH (ref 70–99)
Glucose-Capillary: 122 mg/dL — ABNORMAL HIGH (ref 70–99)

## 2023-04-21 MED ORDER — FUROSEMIDE 40 MG PO TABS
40.0000 mg | ORAL_TABLET | Freq: Every day | ORAL | Status: DC
  Administered 2023-04-21 – 2023-04-22 (×2): 40 mg via ORAL
  Filled 2023-04-21 (×2): qty 1

## 2023-04-21 MED ORDER — POTASSIUM CHLORIDE CRYS ER 20 MEQ PO TBCR
20.0000 meq | EXTENDED_RELEASE_TABLET | Freq: Once | ORAL | Status: AC
  Administered 2023-04-21: 20 meq via ORAL
  Filled 2023-04-21: qty 1

## 2023-04-21 NOTE — Progress Notes (Signed)
Physical Therapy Treatment Patient Details Name: Erika Gates MRN: [id number] DOB: March 01, 1935 Today's Date: 04/21/2023   History of Present Illness 88 y.o. female presented to ED 04/09/23 due to lethargy. +severe anemia, bradycardia and hypothermia, acute respiratory failure requiring BiPAP; +GI bleed;   PMH significant of hypertension, hyperlipidemia, paroxysmal atrial fibrillation, CAD s/p CABG in 1996, diastolic CHF, neurologic myopathy with quadriparesis requiring cervical decompression 07/2022, diabetes mellitus type 2, and morbid obesity    PT Comments  Pt received in supine, agreeable to therapy session and with good participation and tolerance for transfer training with RW and Stedy. Pt performed supine/seated BLE exercises with good participation and remains reliant on at least 3L/min O2 [place] for supine/seated activity and briefly desats with standing on 3L, improved with pursed-lip breathing and seated break. PTA utilized Stedy for OOB to chair transfer given pt buckling with attempts at sidestepping today with RW support, RN notified. Pt continues to benefit from PT services to progress toward functional mobility goals.     If plan is discharge home, recommend the following: Assistance with cooking/housework;Assist for transportation;Help with stairs or ramp for entrance;Two people to help with walking and/or transfers   Can travel by private vehicle     No  Equipment Recommendations  Hospital bed;Other (comment);Hoyer lift    Recommendations for [employer]       Precautions / Restrictions Precautions Precautions: Fall Precaution Comments: watch HR, O2 Restrictions Weight Bearing Restrictions: No     Mobility  Bed Mobility Overal bed mobility: Needs Assistance Bed Mobility: Rolling, Sidelying to Sit Rolling: Used rails, Min assist Sidelying to sit: Min assist, Used rails, HOB elevated       General bed mobility comments: to L EOB; moderate UE reliance on rail and  needing heavy minA for cross-body reaching and trunk support to reach L rail with R arm, then pt able to assist better with raising her trunk and pushing away from bed to sit up. Good BLE initiation to move to EOB.    Transfers Overall transfer level: Needs assistance Equipment used: Rolling walker (2 wheels) Transfers: Sit to/from Stand, Bed to chair/wheelchair/BSC Sit to Stand: Mod assist, From elevated surface           General transfer comment: from EOB<>RW with modA, pt with posterior lean upon standing and unsteady with BLE buckling with single side step toward HOB. Pt sat then PTA used Stedy platform lift to safely transfer from [employer]. RN notified pt safer with Stedy for transfers and written on board in room as well. Transfer via Lift Equipment: Stedy  Ambulation/Gait                   Stairs             Wheelchair Mobility     Tilt Bed    Modified Rankin (Stroke Patients Only)       Balance Overall balance assessment: Needs assistance Sitting-balance support: Feet supported, Single extremity supported Sitting balance-Leahy Scale: Fair Sitting balance - Comments: Fair when pt using LUE support of bed rail   Standing balance support: Bilateral upper extremity supported, During functional activity, Reliant on assistive device for balance Standing balance-Leahy Scale: Poor Standing balance comment: posterior LOB with RW support                            Cognition Arousal: Alert (Pt was sleeping upon OT arrival, but was easy to wake and  alerted quickly) Behavior During Therapy: WFL for tasks assessed/performed Overall Cognitive Status: Impaired/Different from baseline Area of Impairment: Safety/judgement, Awareness, Problem solving                         Safety/Judgement: Decreased awareness of deficits, Decreased awareness of safety Awareness: Emergent Problem Solving: Requires verbal cues, Difficulty  sequencing General Comments: Pt follows 1 step motor commands consistently with increased time. Pt pleasant throughout session.        Exercises General Exercises - Lower Extremity Ankle Circles/Pumps: AROM, Both, 10 reps Long Arc Quad: AROM, Seated, Left, Both, 10 reps (EOB) Hip ABduction/ADduction: AROM, Both, 10 reps, Supine Hip Flexion/Marching: AROM, Both, 10 reps, Seated Other Exercises Other Exercises: IS x 10 reps (pt achieves ~250 mL) Other Exercises: STS x2 reps for BLE strengthening and standing march x1 rep ea (fatigues after 1 rep)    General Comments General comments (skin integrity, edema, etc.): SpO2 desat to 85% on RA; needed 3L O2 [place] to maintain WFL while seated and with transfer, pt briefly desat to 84% on 3L O2 [place], improves to >92% after pursed-lip breathing ~1.5 mins. Pt with significant bil hip ER at rest, improves slightly with Prevalon boots donned      Pertinent Vitals/Pain Pain Assessment Pain Assessment: No/denies pain Pain Intervention(s): Monitored during session, Repositioned    Home Living                          Prior Function            PT Goals (current goals can now be found in the care plan section) Acute Rehab PT Goals Patient Stated Goal: To go home PT Goal Formulation: With family Time For Goal Achievement: 04/24/23 Progress towards PT goals: Progressing toward goals    Frequency    Min 1X/week      PT Plan      Co-evaluation              AM-PAC PT "6 Clicks" Mobility   Outcome Measure  Help needed turning from your back to your side while in a flat bed without using bedrails?: A Little Help needed moving from lying on your back to sitting on the side of a flat bed without using bedrails?: A Lot Help needed moving to and from a bed to a chair (including a wheelchair)?: Total Help needed standing up from a chair using your arms (e.g., wheelchair or bedside chair)?: A Lot Help needed to walk in hospital  room?: Total Help needed climbing 3-5 steps with a railing? : Total 6 Click Score: 10    End of Session Equipment Utilized During Treatment: Gait belt;Oxygen Activity Tolerance: Patient tolerated treatment well;Patient limited by fatigue Patient left: in chair;with call bell/phone within reach;with chair alarm set;Other (comment) (prevalon boots donned to promote neutral hip position) Nurse Communication: Mobility status;Need for lift equipment;Other (comment) (Stedy for return transfer; PTA wrote this on board) PT Visit Diagnosis: Other abnormalities of gait and mobility (R26.89);Muscle weakness (generalized) (M62.81);Difficulty in walking, not elsewhere classified (R26.2)     Time: 1914-7829 PT Time Calculation (min) (ACUTE ONLY): 41 min  Charges:    $Therapeutic Exercise: 23-37 mins $Therapeutic Activity: 8-22 mins PT General Charges $$ ACUTE PT VISIT: 1 Visit                     Keierra Nudo P., PTA Acute Rehabilitation Services Secure Chat Preferred 9a-5:30pm  Office: [phone]    Dorathy Kinsman [place] 04/21/2023, 6:26 PM

## 2023-04-21 NOTE — Plan of Care (Signed)

## 2023-04-21 NOTE — Progress Notes (Signed)
Occupational Therapy Treatment Patient Details Name: Erika Gates MRN: [id number] DOB: [birth year] Today's Date: 04/21/2023   History of present illness 88 y.o. female presented to ED 04/09/23 due to lethargy. +severe anemia, bradycardia and hypothermia, acute respiratory failure requiring BiPAP; +GI bleed;   PMH significant of hypertension, hyperlipidemia, paroxysmal atrial fibrillation, CAD s/p CABG in 1996, diastolic CHF, neurologic myopathy with quadriparesis requiring cervical decompression 07/2022, diabetes mellitus type 2, and morbid obesity   OT comments  Pt asleep upon OT arrival but easy to wake and quickly alerted. Pt declined sitting EOB or OOB activity this session secondary to fatigue but participated well from bed level. OT instructed pt in B UE therapeutic exercises to increase strength, B UE coordination, and activity tolerance for carryover to ADLs, bed mobility during/in preparation for functional tasks, and functional mobility. OT also educated pt in bed mobility and compensatory techniques to compensate for impaired B shoulder flexion AROM during self feeding and grooming with pt demonstrating ability to self-feeding and grooming tasks with Set up. VSS on 3L continuous O2 through nasal cannula throughout session. OT plans to reassess pt progress and update OT goals next session. Pt will benefit from continued acute skilled OT services in address deficits outlined below, decrease caregiver burden, and increase safety and independence with ADLs, bed mobility during/in preparation for ADLs, and functional transfers. Post acute discharge, pt will benefit from intensive inpatient skilled rehab services < 3 hours per day to maximize rehab potential.       If plan is discharge home, recommend the following:  A lot of help with walking and/or transfers;A lot of help with bathing/dressing/bathroom;Assistance with cooking/housework;Assist for transportation;Help with stairs or ramp for  entrance;Direct supervision/assist for medications management;Direct supervision/assist for financial management;Assistance with feeding (Set up for self feeding)   Equipment Recommendations       Recommendations for Other Services      Precautions / Restrictions Precautions Precautions: Fall Precaution Comments: watch HR, O2 Restrictions Weight Bearing Restrictions: No       Mobility Bed Mobility Overal bed mobility: Needs Assistance Bed Mobility: Rolling Rolling: Mod assist, Used rails         General bed mobility comments: Pt declined sitting EOB or OOB activity this day.    Transfers                   General transfer comment: Pt declined sitting EOB or OOB activity this day.     Balance                                           ADL either performed or assessed with clinical judgement   ADL Overall ADL's : Needs assistance/impaired Eating/Feeding: Set up;Bed level (with HOB elevated)   Grooming: Wash/dry hands;Wash/dry face;Oral care;Set up;Bed level (with HOB elevated)                                 General ADL Comments: Pt seen from bed level this day secondary to pt declining sitting EOB or OOB activity this session with pt presenting with increased fatigue and reporting, "I lost all my strength" over the past two to three days. Pt largely continuing to require Set up to Mod assist for UB ADLs and Max assist for LB ADLs with the use of AE.  Extremity/Trunk Assessment Upper Extremity Assessment Upper Extremity Assessment: Generalized weakness;Left hand dominant;RUE deficits/detail;LUE deficits/detail RUE Deficits / Details: Impaired shoulder flexion at baseline RUE Coordination: decreased fine motor;decreased gross motor LUE Deficits / Details: Impaired shoulder flexion at baseline LUE Coordination: decreased fine motor;decreased gross motor   Lower Extremity Assessment Lower Extremity Assessment: Defer to PT  evaluation        Vision       Perception     Praxis      Cognition Arousal: Alert (Pt was sleeping upon OT arrival, but was easy to wake and alerted quickly) Behavior During Therapy: [facility] for tasks assessed/performed Overall Cognitive Status: Impaired/Different from baseline Area of Impairment: Safety/judgement, Awareness, Problem solving                         Safety/Judgement: Decreased awareness of deficits, Decreased awareness of safety Awareness: Emergent Problem Solving: Slow processing, Requires verbal cues, Requires tactile cues, Decreased initiation General Comments: Pt follows 1 step instructions consistently with increased time. Pt pleasant throughout session.        Exercises Exercises: General Upper Extremity, Hand exercises General Exercises - Upper Extremity Shoulder Flexion: AROM, Strengthening, Both, Other reps (comment), Supine (with HOB elevated; 2 sets of 10 reps with rest break between sets and occasional cues to continue to breathe/not hold breath during exercises) Shoulder Extension: AROM, Strengthening, Both, Other reps (comment), Supine (with HOB elevated; 2 sets of 10 reps with rest break between sets and occasional cues to continue to breathe/not hold breath during exercises) Shoulder Horizontal ABduction: AROM, Strengthening, Both, Other reps (comment), Supine (with HOB elevated; 2 sets of 10 reps with rest break between sets and occasional cues to continue to breathe/not hold breath during exercises) Shoulder Horizontal ADduction: AROM, Strengthening, Both, Other reps (comment), Supine (with HOB elevated; 2 sets of 10 reps with rest break between sets and occasional cues to continue to breathe/not hold breath during exercises) Elbow Flexion: AROM, Strengthening, Both, Other reps (comment), Supine, Theraband (with HOB elevated; 2 sets of 10 reps with rest break between sets and occasional cues to continue to breathe/not hold breath during  exercises) Theraband Level (Elbow Flexion): Level 1 (Yellow) Elbow Extension: AROM, Strengthening, Both, Other reps (comment), Supine, Theraband (with HOB elevated; 2 sets of 10 reps with rest break between sets and occasional cues to continue to breathe/not hold breath during exercises) Theraband Level (Elbow Extension): Level 1 (Yellow) Wrist Flexion: AROM, Strengthening, Other reps (comment), Supine (with HOB elevated; 2 sets of 10 reps with rest break between sets) Wrist Extension: AROM, Strengthening, Both, Other reps (comment), Supine (with HOB elevated; 2 sets of 10 reps with rest break between sets) Digit Composite Flexion: AROM, Strengthening, Both, Other reps (comment), Supine, Other (comment) (with HOB elevated; with yellow theraputty; 2 sets of 10 reps with rest break between sets) Composite Extension: AROM, Strengthening, Both, Supine, Other (comment) (with HOB elevated; with yellow theraputty; 2 sets of 10 reps with rest break between sets) Hand Exercises Opposition: AROM, Strengthening, Both, Other reps (comment), Supine (with HOB elevated; with yellow theraputty; 2 sets of 10 reps with rest break between sets)    Shoulder Instructions       General Comments VSS on 3L continuous O2 through nasal cannula throughout session. Pt alert and participated well from bed level but with noted increase in fatigue this session as compared to prior OT sessions.    Pertinent Vitals/ Pain       Pain Assessment Pain  Assessment: No/denies pain Pain Intervention(s): Monitored during session  Home Living                                          Prior Functioning/Environment              Frequency  Min 1X/week        Progress Toward Goals  OT Goals(current goals can now be found in the care plan section)  Progress towards OT goals: Progressing toward goals;OT to reassess next treatment  Acute Rehab OT Goals Patient Stated Goal: To feel better and get stronger  so she can return home  Plan      Co-evaluation                 AM-PAC OT "6 Clicks" Daily Activity     Outcome Measure   Help from another person eating meals?: A Little Help from another person taking care of personal grooming?: A Little Help from another person toileting, which includes using toliet, bedpan, or urinal?: A Lot Help from another person bathing (including washing, rinsing, drying)?: A Lot Help from another person to put on and taking off regular upper body clothing?: A Little Help from another person to put on and taking off regular lower body clothing?: A Lot 6 Click Score: 15    End of Session    OT Visit Diagnosis: Unsteadiness on feet (R26.81);Other abnormalities of gait and mobility (R26.89);Muscle weakness (generalized) (M62.81);Ataxia, unspecified (R27.0)   Activity Tolerance Patient tolerated treatment well;Patient limited by fatigue   Patient Left in bed;with call bell/phone within reach;with bed alarm set   Nurse Communication Mobility status        Time: 1430-1456 OT Time Calculation (min): 26 min  Charges: OT General Charges $OT Visit: 1 Visit OT Treatments $Self Care/Home Management : 8-22 mins $Therapeutic Exercise: 8-22 mins  Jaclene Bartelt "Orson Eva., OTR/L, MA Acute Rehab [phone]   Lendon Colonel 04/21/2023, 3:43 PM

## 2023-04-21 NOTE — Progress Notes (Signed)
PROGRESS NOTE        PATIENT DETAILS Name: Erika Gates Age: 88 y.o. Sex: female Date of Birth: April 17, 1935 Admit Date: 04/09/2023 Admitting Physician Clydie Braun, MD PCP:Sun, Charise Carwin, MD  Brief Summary: Patient is a 88 y.o.  female with history of HTN, HLD, DM-2 PAF, CAD s/p CABG 1996, chronic HFpEF-presented with lethargy-she was found to have severe anemia, acute hypoxia secondary to HFpEF exacerbation and subsequently admitted to the hospitalist service.  Significant events: 8/16>> admitted to [facility]  Significant studies: 8/16>> CT head: No acute intracranial process 8/16>> CT abdomen/pelvis: No retroperitoneal hematoma 8/16>> echo: EF 70-75%, grade 2 diastolic dysfunction  Significant microbiology data: 8/16>> COVID PCR: Negative  Procedures: 8/20>> EGD: Erosive gastropathy, nonbleeding duodenal ulcer  Consults: GI  Subjective: Lying comfortably in bed-denies any chest pain or shortness of breath.  Objective: Vitals: Blood pressure (!) 134/58, pulse 78, temperature 97.6 F (36.4 C), temperature source Oral, resp. rate 19, weight 93.5 kg, SpO2 99%.   Exam: Gen Exam: Looks frail-awake/alert. HEENT:atraumatic, normocephalic Chest: B/L clear to auscultation anteriorly CVS:S1S2 regular Abdomen:soft non tender, non distended Extremities:trace edema Neurology: Non focal Skin: no rash  Pertinent Labs/Radiology:    Latest Ref Rng & Units 04/21/2023    8:57 AM 04/20/2023   10:17 AM 04/19/2023    4:07 AM  CBC  WBC 4.0 - 10.5 K/uL 6.2  7.0  8.6   Hemoglobin 12.0 - 15.0 g/dL 8.5  8.7  8.5   Hematocrit 36.0 - 46.0 % 32.2  32.1  32.4   Platelets 150 - 400 K/uL 113  100  85     Lab Results  Component Value Date   NA 140 04/21/2023   K 3.5 04/21/2023   CL 92 (L) 04/21/2023   CO2 41 (H) 04/21/2023      Assessment/Plan: Acute hypoxic respiratory failure due to HFpEF exacerbation Volume status improved with IV diuretics Continue to  titrate down FiO2 as tolerated-stable on just 2 L of oxygen this morning -12 L so far Change to oral furosemide today Pulmonary toileting/mobilization  Upper GI bleed with acute blood loss anemia On Eliquis-Hemoccult positive-but no obvious bleeding noted by patient/family EGD showed erosive gastropathy/nonbleeding duodenal ulcer On PPI Eliquis held for 7 days and restarted on 8/27 without any major issues. Follow CBC-however Hb stable after total of 4 units of PRBC.  Acute metabolic encephalopathy Secondary to hypoxia CT imaging negative Improving-answering questions appropriately this morning  AKI Hemodynamically mediated Resolved  UTI Treated with Rocephin  PAF Metoprolol Eliquis held on admission-subsequently resumed 8/27 Telemetry monitoring  Sinus bradycardia Reportedly heart rate in the 30s when initially presented to EMS TSH borderline-suggestive of sick euthyroid syndrome Since heart rate has improved-beta-blocker as being resumed-continue telemetry monitoring.  HLD Statin  Debility/deconditioning PT/OT eval-SNF planned.  Pressure Ulcer: Pressure Injury 04/18/23 Coccyx Medial Stage 2 -  Partial thickness loss of dermis presenting as a shallow open injury with a red, pink wound bed without slough. cracked skin (Active)  04/18/23 0411  Location: Coccyx  Location Orientation: Medial  Staging: Stage 2 -  Partial thickness loss of dermis presenting as a shallow open injury with a red, pink wound bed without slough.  Wound Description (Comments): cracked skin  Present on Admission: No  Dressing Type Foam - Lift dressing to assess site every shift 04/21/23 0901    Morbid  Obesity: Estimated body mass index is 40.26 kg/m as calculated from the following:   Height as of 01/12/23: 5' (1.524 m).   Weight as of this encounter: 93.5 kg.   Code status:   Code Status: Full Code   DVT Prophylaxis: Place and maintain sequential compression device Start: 04/10/23  1005 apixaban (ELIQUIS) tablet 5 mg    Family Communication: None at bedside   Disposition Plan: Status is: Inpatient Remains inpatient appropriate because: Severity of illness   Planned Discharge Destination:Skilled nursing facility   Diet: Diet Order             DIET DYS 3 Room service appropriate? Yes; Fluid consistency: Thin  Diet effective now                     Antimicrobial agents: Anti-infectives (From admission, onward)    Start     Dose/Rate Route Frequency Ordered Stop   04/09/23 1230  cefTRIAXone (ROCEPHIN) 2 g in sodium chloride 0.9 % 100 mL IVPB  Status:  Discontinued        2 g 200 mL/hr over 30 Minutes Intravenous Every 24 hours 04/09/23 1220 04/16/23 1107        MEDICATIONS: Scheduled Meds:  apixaban  5 mg Oral BID   vitamin B-12  500 mcg Oral Daily   fluticasone  2 spray Each Nare Daily   furosemide  40 mg Intravenous Daily   gabapentin  100 mg Oral QHS   metoprolol succinate  50 mg Oral Daily   pantoprazole  40 mg Oral BID   Followed by   Melene Muller ON 05/09/2023] pantoprazole  40 mg Oral Daily   rosuvastatin  20 mg Oral QPM   sodium chloride flush  3 mL Intravenous Q12H   Continuous Infusions:  sodium chloride 10 mL/hr at 04/10/23 1427   lactated ringers 10 mL/hr at 04/13/23 1112   PRN Meds:.sodium chloride, acetaminophen **OR** acetaminophen, albuterol, diclofenac Sodium, docusate sodium, guaiFENesin, lidocaine, naphazoline-pheniramine, ondansetron (ZOFRAN) IV, polyethylene glycol, sodium chloride   I have personally reviewed following labs and imaging studies  LABORATORY DATA: CBC: Recent Labs  Lab 04/15/23 0310 04/16/23 0616 04/17/23 1813 04/18/23 0855 04/19/23 0407 04/20/23 1017 04/21/23 0857  WBC 7.1  --  10.4 9.2 8.6 7.0 6.2  NEUTROABS 5.5  --   --  7.3  --   --   --   HGB 7.5*   < > 8.7* 8.7* 8.5* 8.7* 8.5*  HCT 27.8*   < > 33.1* 33.1* 32.4* 32.1* 32.2*  MCV 84.2  --  86.0 88.0 85.9 87.0 86.6  PLT 84*  --  78* 86*  85* 100* 113*   < > = values in this interval not displayed.    Basic Metabolic Panel: Recent Labs  Lab 04/17/23 1813 04/18/23 0451 04/19/23 0407 04/20/23 1017 04/21/23 0857  NA 144 140 140 139 140  K 4.0 4.0 4.0 3.7 3.5  CL 98 97* 95* 93* 92*  CO2 40* 39* 38* 38* 41*  GLUCOSE 118* 87 91 119* 134*  BUN 17 16 23 22 20   CREATININE 0.77 0.87 1.05* 0.84 0.85  CALCIUM 10.1 10.0 9.8 9.7 9.8    GFR: Estimated Creatinine Clearance: 48.5 mL/min (by C-G formula based on SCr of 0.85 mg/dL).  Liver Function Tests: No results for input(s): "AST", "ALT", "ALKPHOS", "BILITOT", "PROT", "ALBUMIN" in the last 168 hours. No results for input(s): "LIPASE", "AMYLASE" in the last 168 hours. No results for input(s): "AMMONIA" in the last  168 hours.  Coagulation Profile: No results for input(s): "INR", "PROTIME" in the last 168 hours.  Cardiac Enzymes: No results for input(s): "CKTOTAL", "CKMB", "CKMBINDEX", "TROPONINI" in the last 168 hours.  BNP (last 3 results) No results for input(s): "PROBNP" in the last 8760 hours.  Lipid Profile: No results for input(s): "CHOL", "HDL", "LDLCALC", "TRIG", "CHOLHDL", "LDLDIRECT" in the last 72 hours.  Thyroid Function Tests: No results for input(s): "TSH", "T4TOTAL", "FREET4", "T3FREE", "THYROIDAB" in the last 72 hours.  Anemia Panel: No results for input(s): "VITAMINB12", "FOLATE", "FERRITIN", "TIBC", "IRON", "RETICCTPCT" in the last 72 hours.  Urine analysis:    Component Value Date/Time   COLORURINE YELLOW 04/09/2023 0146   APPEARANCEUR HAZY (A) 04/09/2023 0146   LABSPEC 1.012 04/09/2023 0146   PHURINE 5.0 04/09/2023 0146   GLUCOSEU >=500 (A) 04/09/2023 0146   GLUCOSEU NEGATIVE 07/11/2007 1122   HGBUR NEGATIVE 04/09/2023 0146   BILIRUBINUR NEGATIVE 04/09/2023 0146   KETONESUR NEGATIVE 04/09/2023 0146   PROTEINUR 30 (A) 04/09/2023 0146   UROBILINOGEN 1.0 mg/dL 69/62/9528 4132   NITRITE NEGATIVE 04/09/2023 0146   LEUKOCYTESUR NEGATIVE  04/09/2023 0146    Sepsis Labs: Lactic Acid, Venous    Component Value Date/Time   LATICACIDVEN 0.5 04/09/2023 0425    MICROBIOLOGY: No results found for this or any previous visit (from the past 240 hour(s)).  RADIOLOGY STUDIES/RESULTS: No results found.   LOS: 12 days   Jeoffrey Massed, MD  [facility]    To contact the attending provider between 7A-7P or the covering provider during after hours 7P-7A, please log into the web site [email or web address] and access using universal [place] password for that web site. If you do not have the password, please call the hospital operator.  04/21/2023, 12:18 PM

## 2023-04-22 DIAGNOSIS — I480 Paroxysmal atrial fibrillation: Secondary | ICD-10-CM | POA: Diagnosis not present

## 2023-04-22 DIAGNOSIS — D649 Anemia, unspecified: Secondary | ICD-10-CM | POA: Diagnosis not present

## 2023-04-22 DIAGNOSIS — E7800 Pure hypercholesterolemia, unspecified: Secondary | ICD-10-CM | POA: Diagnosis not present

## 2023-04-22 DIAGNOSIS — F419 Anxiety disorder, unspecified: Secondary | ICD-10-CM | POA: Diagnosis not present

## 2023-04-22 DIAGNOSIS — K922 Gastrointestinal hemorrhage, unspecified: Secondary | ICD-10-CM | POA: Diagnosis not present

## 2023-04-22 DIAGNOSIS — G959 Disease of spinal cord, unspecified: Secondary | ICD-10-CM | POA: Diagnosis not present

## 2023-04-22 DIAGNOSIS — R531 Weakness: Secondary | ICD-10-CM | POA: Diagnosis not present

## 2023-04-22 DIAGNOSIS — M6281 Muscle weakness (generalized): Secondary | ICD-10-CM | POA: Diagnosis not present

## 2023-04-22 DIAGNOSIS — J9602 Acute respiratory failure with hypercapnia: Secondary | ICD-10-CM | POA: Diagnosis not present

## 2023-04-22 DIAGNOSIS — Z7401 Bed confinement status: Secondary | ICD-10-CM | POA: Diagnosis not present

## 2023-04-22 DIAGNOSIS — D500 Iron deficiency anemia secondary to blood loss (chronic): Secondary | ICD-10-CM | POA: Diagnosis not present

## 2023-04-22 DIAGNOSIS — R001 Bradycardia, unspecified: Secondary | ICD-10-CM | POA: Diagnosis not present

## 2023-04-22 DIAGNOSIS — R2689 Other abnormalities of gait and mobility: Secondary | ICD-10-CM | POA: Diagnosis not present

## 2023-04-22 DIAGNOSIS — I5033 Acute on chronic diastolic (congestive) heart failure: Secondary | ICD-10-CM | POA: Diagnosis not present

## 2023-04-22 DIAGNOSIS — M109 Gout, unspecified: Secondary | ICD-10-CM | POA: Diagnosis not present

## 2023-04-22 DIAGNOSIS — D62 Acute posthemorrhagic anemia: Secondary | ICD-10-CM | POA: Diagnosis not present

## 2023-04-22 DIAGNOSIS — N390 Urinary tract infection, site not specified: Secondary | ICD-10-CM | POA: Diagnosis not present

## 2023-04-22 DIAGNOSIS — R278 Other lack of coordination: Secondary | ICD-10-CM | POA: Diagnosis not present

## 2023-04-22 DIAGNOSIS — F411 Generalized anxiety disorder: Secondary | ICD-10-CM | POA: Diagnosis not present

## 2023-04-22 DIAGNOSIS — E119 Type 2 diabetes mellitus without complications: Secondary | ICD-10-CM | POA: Diagnosis not present

## 2023-04-22 DIAGNOSIS — K219 Gastro-esophageal reflux disease without esophagitis: Secondary | ICD-10-CM | POA: Diagnosis not present

## 2023-04-22 DIAGNOSIS — K264 Chronic or unspecified duodenal ulcer with hemorrhage: Secondary | ICD-10-CM | POA: Diagnosis not present

## 2023-04-22 DIAGNOSIS — G9341 Metabolic encephalopathy: Secondary | ICD-10-CM | POA: Diagnosis not present

## 2023-04-22 DIAGNOSIS — R0902 Hypoxemia: Secondary | ICD-10-CM | POA: Diagnosis not present

## 2023-04-22 DIAGNOSIS — I10 Essential (primary) hypertension: Secondary | ICD-10-CM | POA: Diagnosis not present

## 2023-04-22 DIAGNOSIS — I5031 Acute diastolic (congestive) heart failure: Secondary | ICD-10-CM | POA: Diagnosis not present

## 2023-04-22 DIAGNOSIS — L89153 Pressure ulcer of sacral region, stage 3: Secondary | ICD-10-CM | POA: Diagnosis not present

## 2023-04-22 DIAGNOSIS — L89150 Pressure ulcer of sacral region, unstageable: Secondary | ICD-10-CM | POA: Diagnosis not present

## 2023-04-22 DIAGNOSIS — J9601 Acute respiratory failure with hypoxia: Secondary | ICD-10-CM | POA: Diagnosis not present

## 2023-04-22 LAB — BASIC METABOLIC PANEL
Anion gap: 7 (ref 5–15)
BUN: 20 mg/dL (ref 8–23)
CO2: 40 mmol/L — ABNORMAL HIGH (ref 22–32)
Calcium: 9.7 mg/dL (ref 8.9–10.3)
Chloride: 94 mmol/L — ABNORMAL LOW (ref 98–111)
Creatinine, Ser: 1.25 mg/dL — ABNORMAL HIGH (ref 0.44–1.00)
GFR, Estimated: 42 mL/min — ABNORMAL LOW (ref >60)
Glucose, Bld: 90 mg/dL (ref 70–99)
Potassium: 3.6 mmol/L (ref 3.5–5.1)
Sodium: 141 mmol/L (ref 135–145)

## 2023-04-22 LAB — CBC
HCT: 30.7 % — ABNORMAL LOW (ref 36.0–46.0)
Hemoglobin: 8.2 g/dL — ABNORMAL LOW (ref 12.0–15.0)
MCH: 22.7 pg — ABNORMAL LOW (ref 26.0–34.0)
MCHC: 26.7 g/dL — ABNORMAL LOW (ref 30.0–36.0)
MCV: 84.8 fL (ref 80.0–100.0)
Platelets: 127 10*3/uL — ABNORMAL LOW (ref 150–400)
RBC: 3.62 MIL/uL — ABNORMAL LOW (ref 3.87–5.11)
RDW: 23.7 % — ABNORMAL HIGH (ref 11.5–15.5)
WBC: 7.1 10*3/uL (ref 4.0–10.5)
nRBC: 0 % (ref 0.0–0.2)

## 2023-04-22 LAB — GLUCOSE, CAPILLARY
Glucose-Capillary: 105 mg/dL — ABNORMAL HIGH (ref 70–99)
Glucose-Capillary: 98 mg/dL (ref 70–99)

## 2023-04-22 NOTE — TOC Transition Note (Signed)
Transition of Care [facility]) - CM/SW Discharge Note   Patient Details  Name: Erika Gates MRN: [id number] Date of Birth: [birth year]  Transition of Care [facility]) CM/SW Contact:  Mearl Latin, LCSW Phone Number: 04/22/2023, 1:35 PM   Clinical Narrative:    Patient will DC to: [facility] Anticipated DC date: 04/22/23 Family notified: Daughter at bedside Transport by: Sharin Mons   Per MD patient ready for DC to [facility]. RN to call report prior to discharge [phone] room 403P). RN, patient, patient's family, and facility notified of DC. Discharge Summary and FL2 sent to facility. DC packet on chart. Ambulance transport requested for patient.   CSW will sign off for now as social work intervention is no longer needed. Please consult [place] again if new needs arise.     Final next level of care: Skilled Nursing Facility Barriers to Discharge: Barriers Resolved   Patient Goals and CMS Choice CMS Medicare.gov Compare Post Acute Care list provided to:: Patient Choice offered to / list presented to : Patient, Adult Children  Discharge Placement     Existing PASRR number confirmed : 04/22/23          Patient chooses bed at: [facility] Patient to be transferred to facility by: [facility] Name of family member notified: Daughter Patient and family notified of of transfer: 04/22/23  Discharge Plan and Services Additional resources added to the After Visit Summary for                                       Social Determinants of Health (SDOH) Interventions SDOH Screenings   Food Insecurity: No Food Insecurity (04/09/2023)  Housing: Low Risk  (04/09/2023)  Transportation Needs: No Transportation Needs (04/09/2023)  Utilities: Not At Risk (04/09/2023)  Financial Resource Strain: Low Risk  (08/27/2022)   Received from [facility], [facility]  Tobacco Use: Low Risk  (01/12/2023)  Health Literacy: Adequate Health Literacy (08/27/2022)   Received  from [facility], [facility]     Readmission Risk Interventions     No data to display

## 2023-04-22 NOTE — TOC Progression Note (Signed)
Transition of Care [facility]) - Progression Note    Patient Details  Name: Erika Gates MRN: [id number] Date of Birth: June 04, 1935  Transition of Care [facility]) CM/SW Contact  Mearl Latin, LCSW Phone Number: 04/22/2023, 11:36 AM  Clinical Narrative:    CSW met with patient and daughter and requested they bring home Eliquis to [place] which daughter reported they could. CSW confirmed no need for CPAP with MD. [facility] will let CSW know what time they can accept patient as they are awaiting the room to open up after a discharge.    Expected Discharge Plan: Skilled Nursing Facility Barriers to Discharge: Continued Medical Work up  Expected Discharge Plan and Services       Living arrangements for the past 2 months: Single Family Home Expected Discharge Date: 04/22/23                                     Social Determinants of Health (SDOH) Interventions SDOH Screenings   Food Insecurity: No Food Insecurity (04/09/2023)  Housing: Low Risk  (04/09/2023)  Transportation Needs: No Transportation Needs (04/09/2023)  Utilities: Not At Risk (04/09/2023)  Financial Resource Strain: Low Risk  (08/27/2022)   Received from [facility], [facility]  Tobacco Use: Low Risk  (01/12/2023)  Health Literacy: Adequate Health Literacy (08/27/2022)   Received from [facility], [facility]    Readmission Risk Interventions     No data to display

## 2023-04-22 NOTE — TOC Progression Note (Signed)
Transition of Care [facility]) - Progression Note    Patient Details  Name: Erika Gates MRN: [id number] Date of Birth: 03-04-1935  Transition of Care [facility]) CM/SW Contact  Mearl Latin, LCSW Phone Number: 04/22/2023, 9:08 AM  Clinical Narrative:    CSW received call from patient's grandson. They were able to tour both SNFs and are requesting [facility]. CSW updated insurance.  Insurance approval received: Ref# [id number], Auth ID# [id number], effective 04/21/2023-04/23/2023.    Expected Discharge Plan: Skilled Nursing Facility Barriers to Discharge: Continued Medical Work up  Expected Discharge Plan and Services       Living arrangements for the past 2 months: Single Family Home                                       Social Determinants of Health (SDOH) Interventions SDOH Screenings   Food Insecurity: No Food Insecurity (04/09/2023)  Housing: Low Risk  (04/09/2023)  Transportation Needs: No Transportation Needs (04/09/2023)  Utilities: Not At Risk (04/09/2023)  Financial Resource Strain: Low Risk  (08/27/2022)   Received from [facility], [facility]  Tobacco Use: Low Risk  (01/12/2023)  Health Literacy: Adequate Health Literacy (08/27/2022)   Received from [facility], [facility]    Readmission Risk Interventions     No data to display

## 2023-04-22 NOTE — Plan of Care (Signed)
  Problem: Clinical Measurements: Goal: Will remain free from infection Outcome: Progressing   Problem: Nutrition: Goal: Adequate nutrition will be maintained Outcome: Progressing   Problem: Elimination: Goal: Will not experience complications related to urinary retention Outcome: Progressing   Problem: Pain Managment: Goal: General experience of comfort will improve Outcome: Progressing

## 2023-04-22 NOTE — Discharge Summary (Signed)
PATIENT DETAILS Name: Erika Gates Age: 88 y.o. Sex: female Date of Birth: Dec 20, 1934 MRN: [id number]. Admitting Physician: Clydie Braun, MD PCP:Sun, Charise Carwin, MD  Admit Date: 04/09/2023 Discharge date: 04/22/2023  Recommendations for Outpatient Follow-up:  Follow up with PCP in 1-2 weeks Please obtain CMP/CBC in one week  Admitted From:  Home  Disposition: Skilled nursing facility   Discharge Condition: good  CODE STATUS:   Code Status: Full Code   Diet recommendation:  Diet Order             Diet - low sodium heart healthy           DIET DYS 3 Room service appropriate? Yes; Fluid consistency: Thin  Diet effective now                    Brief Summary: Patient is a 88 y.o.  female with history of HTN, HLD, DM-2 PAF, CAD s/p CABG 1996, chronic HFpEF-presented with lethargy-she was found to have severe anemia, acute hypoxia secondary to HFpEF exacerbation and subsequently admitted to the hospitalist service.   Significant events: 8/16>> admitted to [facility]   Significant studies: 8/16>> CT head: No acute intracranial process 8/16>> CT abdomen/pelvis: No retroperitoneal hematoma 8/16>> echo: EF 70-75%, grade 2 diastolic dysfunction   Significant microbiology data: 8/16>> COVID PCR: Negative   Procedures: 8/20>> EGD: Erosive gastropathy, nonbleeding duodenal ulcer   Consults: GI  Brief Hospital Course: Acute hypoxic respiratory failure due to HFpEF exacerbation Volume status improved with IV diuretics and now euvolemic -14 L so far Weight down to 206 pounds (231 pounds on admit) Either on room air or on just 1-2 L of oxygen Has been transitioned to oral diuretic regimen. Resume Farxiga/lisinopril Continue beta-blocker as previous Continue to electrolytes/volume status closely in the outpatient setting.   Upper GI bleed with acute blood loss anemia On Eliquis-Hemoccult positive-but no obvious bleeding noted by patient/family Required a total of 4  units of PRBC EGD showed erosive gastropathy/nonbleeding duodenal ulcer On PPI Eliquis held for 7 days and restarted on 8/27 without any major issues.  Hb remains stable since resumption of Eliquis.   Acute metabolic encephalopathy Secondary to hypoxia CT imaging negative Much improved with supportive care-and improvement of hypoxia.   AKI Hemodynamically mediated Resolved   UTI Treated with Rocephin   PAF Metoprolol Eliquis held on admission-subsequently resumed 8/27 No evidence of recurrent GI bleeding.   Sinus bradycardia Reportedly heart rate in the 30s when initially presented to EMS TSH borderline-suggestive of sick euthyroid syndrome Since heart rate rebounded-beta-blocker has been resumed without any major issues    HLD Statin   Debility/deconditioning PT/OT eval-SNF planned.   Morbid Obesity: Estimated body mass index is 40.3 kg/m as calculated from the following:   Height as of 01/12/23: 5' (1.524 m).   Weight as of this encounter: 93.6 kg.   Pressure injury documentation: Agree with assessment and plan as outlined below. Pressure Injury 04/18/23 Coccyx Medial Stage 2 -  Partial thickness loss of dermis presenting as a shallow open injury with a red, pink wound bed without slough. cracked skin (Active)  04/18/23 0411  Location: Coccyx  Location Orientation: Medial  Staging: Stage 2 -  Partial thickness loss of dermis presenting as a shallow open injury with a red, pink wound bed without slough.  Wound Description (Comments): cracked skin  Present on Admission: No  Dressing Type Foam - Lift dressing to assess site every shift 04/21/23 2239    Discharge  Diagnoses:  Principal Problem:   Acute respiratory failure with hypoxia and hypercapnia (HCC) Active Problems:   Diastolic congestive heart failure (HCC)   Acute blood loss anemia   GI bleed   Sinus bradycardia   Transient hypotension   SIRS (systemic inflammatory response syndrome) (HCC)   Abnormal  urinalysis   Pancytopenia (HCC)   Paroxysmal atrial fibrillation (HCC)   Acute metabolic encephalopathy   Renal insufficiency   GERD (gastroesophageal reflux disease)   Discharge Instructions:  Activity:  As tolerated with Full fall precautions use walker/cane & assistance as needed  Discharge Instructions     (HEART FAILURE PATIENTS) Call MD:  Anytime you have any of the following symptoms: 1) 3 pound weight gain in 24 hours or 5 pounds in 1 week 2) shortness of breath, with or without a dry hacking cough 3) swelling in the hands, feet or stomach 4) if you have to sleep on extra pillows at night in order to breathe.   Complete by: As directed    Call MD for:  difficulty breathing, headache or visual disturbances   Complete by: As directed    Call MD for:  persistant dizziness or light-headedness   Complete by: As directed    Diet - low sodium heart healthy   Complete by: As directed    Discharge instructions   Complete by: As directed    Follow with Primary MD  [name], MD in 1-2 weeks  Please get a complete blood count and chemistry panel checked by your Primary MD at your next visit, and again as instructed by your Primary MD.  Get Medicines reviewed and adjusted: Please take all your medications with you for your next visit with your Primary MD  Laboratory/radiological data: Please request your Primary MD to go over all hospital tests and procedure/radiological results at the follow up, please ask your Primary MD to get all Hospital records sent to his/her office.  In some cases, they will be blood work, cultures and biopsy results pending at the time of your discharge. Please request that your primary care M.D. follows up on these results.  Also Note the following: If you experience worsening of your admission symptoms, develop shortness of breath, life threatening emergency, suicidal or homicidal thoughts you must seek medical attention immediately by calling 911 or  calling your MD immediately  if symptoms less severe.  You must read complete instructions/literature along with all the possible adverse reactions/side effects for all the Medicines you take and that have been prescribed to you. Take any new Medicines after you have completely understood and accpet all the possible adverse reactions/side effects.   Do not drive when taking Pain medications or sleeping medications (Benzodaizepines)  Do not take more than prescribed Pain, Sleep and Anxiety Medications. It is not advisable to combine anxiety,sleep and pain medications without talking with your primary care practitioner  Special Instructions: If you have smoked or chewed Tobacco  in the last 2 yrs please stop smoking, stop any regular Alcohol  and or any Recreational drug use.  Wear Seat belts while driving.  Please note: You were cared for by a hospitalist during your hospital stay. Once you are discharged, your primary care physician will handle any further medical issues. Please note that NO REFILLS for any discharge medications will be authorized once you are discharged, as it is imperative that you return to your primary care physician (or establish a relationship with a primary care physician if you do not have  one) for your post hospital discharge needs so that they can reassess your need for medications and monitor your lab values.   Increase activity slowly   Complete by: As directed    No wound care   Complete by: As directed       Allergies as of 04/22/2023       Reactions   Cardizem [diltiazem] Rash   Glucophage [metformin] Other (See Comments)   "it made me sad"        Medication List     STOP taking these medications    clonazePAM 0.5 MG disintegrating tablet Commonly known as: KLONOPIN       TAKE these medications    acetaminophen 325 MG tablet Commonly known as: TYLENOL Take 650 mg by mouth 2 (two) times daily as needed for moderate pain, fever or headache.    ammonium lactate 12 % lotion Commonly known as: LAC-HYDRIN Apply 1 Application topically daily as needed for dry skin.   apixaban 5 MG Tabs tablet Commonly known as: ELIQUIS Take 1 tablet (5 mg total) by mouth 2 (two) times daily.   dapagliflozin propanediol 10 MG Tabs tablet Commonly known as: Farxiga Take 1 tablet (10 mg total) by mouth daily before breakfast.   diclofenac sodium 1 % Gel Commonly known as: VOLTAREN Apply 2 g topically daily as needed (pain).   docusate sodium 100 MG capsule Commonly known as: COLACE Take 1 capsule (100 mg total) by mouth 2 (two) times daily. What changed:  when to take this reasons to take this   furosemide 20 MG tablet Commonly known as: Lasix Take 1 tablet (20 mg total) by mouth daily.   gabapentin 100 MG capsule Commonly known as: NEURONTIN Take 1 capsule (100 mg total) by mouth at bedtime. What changed: when to take this   lidocaine 4 % Place 1 patch onto the skin daily as needed (pain).   lisinopril 20 MG tablet Commonly known as: ZESTRIL Take 1 tablet (20 mg total) by mouth daily.   metoprolol succinate 100 MG 24 hr tablet Commonly known as: TOPROL-XL Take 1 tablet (100 mg total) by mouth daily.   Multivitamin Women 50+ Tabs Take 1 tablet by mouth daily.   pantoprazole 40 MG tablet Commonly known as: PROTONIX Take 1 tablet (40 mg total) by mouth daily.   polyethylene glycol 17 g packet Commonly known as: MIRALAX / GLYCOLAX Take 17 g by mouth daily as needed (constipation).   rosuvastatin 20 MG tablet Commonly known as: CRESTOR Take 1 tablet (20 mg total) by mouth daily.   VITAMIN D-3 PO Take 1 tablet by mouth daily.   VITAMIN E PO Take 1 capsule by mouth daily.        Contact information for follow-up providers     [facility] Follow up.   Specialty: Home Health Services Why: [facility] will provide Home Health services and will contact you within 48 hours of discharging home Contact  information: [address] [place] [address] [phone]              Contact information for after-discharge care     Destination     [facility] .   Service: Skilled Nursing Contact information: [address] [facility] Address [place] [address] [phone]                    Allergies  Allergen Reactions   Cardizem [Diltiazem] Rash   Glucophage [Metformin]  Other (See Comments)    "it made me sad"     Other Procedures/Studies: DG Chest Port 1 View  Result Date: 04/17/2023 CLINICAL DATA:  Bradycardia.  Dyspnea. EXAM: PORTABLE CHEST 1 VIEW COMPARISON:  One-view chest x-ray 04/16/2023 FINDINGS: The heart is enlarged. Chronic elevation the right hemidiaphragm is noted. Diffuse interstitial pattern is increasing. Bibasilar linear opacities likely reflect atelectasis. Advanced degenerative changes are present in the shoulders bilaterally. Cervical spine fusion is noted. IMPRESSION: 1. Cardiomegaly and increasing interstitial pattern compatible with congestive heart failure. Electronically Signed   By: Marin Roberts M.D.   On: 04/17/2023 14:14   DG Chest Port 1 View  Result Date: 04/16/2023 CLINICAL DATA:  Hypoxia.  Hypercapnia.  Difficulty breathing. EXAM: PORTABLE CHEST 1 VIEW COMPARISON:  04/11/2023. FINDINGS: Redemonstration of chronic interstitial prominence, similar to the prior study. No acute consolidation or major lung collapse. Note is again made of elevated right hemidiaphragm. There are atelectatic changes at the right lung base. Bilateral lateral costophrenic angle are clear. Stable cardio-mediastinal silhouette. There are surgical staples along the heart border and sternotomy wires, status post CABG (coronary artery bypass graft). No acute osseous abnormalities. Cervical spinal fixation hardware is seen. The soft tissues are within normal limits. IMPRESSION: 1. Chronic interstitial prominence. No  acute consolidation or major lung collapse. Electronically Signed   By: Jules Schick M.D.   On: 04/16/2023 10:29   DG Chest Port 1 View  Result Date: 04/11/2023 CLINICAL DATA:  Shortness of breath. EXAM: PORTABLE CHEST 1 VIEW COMPARISON:  04/10/2023 FINDINGS: Sternotomy wires unchanged. Lungs are somewhat hypoinflated with stable elevation of the right hemidiaphragm. Minimal linear scarring over the right base. Minimal prominence of the central pulmonary vessels improved compared to the previous exam and likely minimal residual vascular congestion. Mild stable cardiomegaly. No effusion or pneumothorax. Remainder of the exam is unchanged. IMPRESSION: 1. Interval improvement with minimal residual vascular congestion. 2. Minimal linear scarring over the right base. Electronically Signed   By: Elberta Fortis M.D.   On: 04/11/2023 08:17   DG Chest Port 1 View  Result Date: 04/10/2023 CLINICAL DATA:  Shortness of breath. EXAM: PORTABLE CHEST 1 VIEW COMPARISON:  04/09/2023 FINDINGS: The cardio pericardial silhouette is enlarged. A stable asymmetric elevation right hemidiaphragm. Bibasilar atelectasis or infiltrate noted. Vascular congestion without overt pulmonary edema. Telemetry leads overlie the chest. IMPRESSION: 1. Bibasilar atelectasis or infiltrate. 2. Vascular congestion without overt pulmonary edema. Electronically Signed   By: Kennith Center M.D.   On: 04/10/2023 09:31   CT ABDOMEN PELVIS WO CONTRAST  Result Date: 04/09/2023 CLINICAL DATA:  Retroperitoneal bleed suspected. EXAM: CT ABDOMEN AND PELVIS WITHOUT CONTRAST TECHNIQUE: Multidetector CT imaging of the abdomen and pelvis was performed following the standard protocol without IV contrast. RADIATION DOSE REDUCTION: This exam was performed according to the departmental dose-optimization program which includes automated exposure control, adjustment of the mA and/or kV according to patient size and/or use of iterative reconstruction technique.  COMPARISON:  None Available. FINDINGS: Lower chest: The heart is enlarged and multi-vessel coronary artery calcifications are noted. There is a small right pleural effusion with atelectasis or infiltrate at the lung bases. Hepatobiliary: No focal liver abnormality is seen. Multiple stones are present within the gallbladder. No biliary ductal dilatation. Pancreas: Unremarkable. No pancreatic ductal dilatation or surrounding inflammatory changes. Spleen: Normal in size without focal abnormality. Adrenals/Urinary Tract: The adrenal glands are within normal limits. No renal calculus or hydronephrosis. The bladder is unremarkable. Stomach/Bowel: Stomach is within normal limits. Appendix  appears normal. No evidence of bowel wall thickening, distention, or inflammatory changes. No free air or pneumatosis. Scattered diverticula are present along the colon without evidence of diverticulitis. Vascular/Lymphatic: Aortic atherosclerosis. No enlarged abdominal or pelvic lymph nodes. Reproductive: Status post hysterectomy. No adnexal masses. Other: Small amount of free fluid is noted in the pelvis. Anasarca is noted. No evidence of retroperitoneal hemorrhage is seen. There is a right lower quadrant periumbilical hernia containing ascites. Musculoskeletal: Sternotomy wires are noted. Degenerative changes are present in the thoracolumbar spine. No acute osseous abnormality. IMPRESSION: 1. No evidence of retroperitoneal hematoma. 2. Small right pleural effusion with atelectasis or infiltrate at the lung bases. 3. Anasarca. 4. Cholelithiasis. 5. Diverticulosis without diverticulitis. 6. Periumbilical hernia to the right of midline containing fat and ascites. 7. Aortic atherosclerosis and coronary artery calcifications. Electronically Signed   By: Thornell Sartorius M.D.   On: 04/09/2023 21:45   ECHOCARDIOGRAM COMPLETE  Result Date: 04/09/2023    ECHOCARDIOGRAM REPORT   Patient Name:   JASALYN PLATE Date of Exam: 04/09/2023 Medical  Rec #:  [id number]        Height:       60.0 in Accession #:    [id number]       Weight:       210.0 lb Date of Birth:  04/29/1935        BSA:          1.906 m Patient Age:    88 years         BP:           108/49 mmHg Patient Gender: F                HR:           64 bpm. Exam Location:  Inpatient Procedure: 2D Echo, Cardiac Doppler and Color Doppler Indications:    CHF-Acute Diastolic I50.31  History:        Patient has prior history of Echocardiogram examinations, most                 recent 07/14/2021. CHF, Arrythmias:Atrial Fibrillation and                 Bradycardia; Risk Factors:Hypertension and Dyslipidemia.  Sonographer:    Lucendia Herrlich Referring Phys: [id number] RONDELL A SMITH IMPRESSIONS  1. No significant change from echo done in NOv 2022.  2. Left ventricular ejection fraction, by estimation, is 70 to 75%. The left ventricle has hyperdynamic function. The left ventricle has no regional wall motion abnormalities. There is mild left ventricular hypertrophy. Left ventricular diastolic parameters are consistent with Grade II diastolic dysfunction (pseudonormalization). Elevated left atrial pressure.  3. Right ventricular systolic function is mildly reduced. The right ventricular size is mildly enlarged.  4. Left atrial size was mildly dilated.  5. The mitral valve is normal in structure. Mild mitral valve regurgitation.  6. The aortic valve is normal in structure. Aortic valve regurgitation is not visualized.  7. The inferior vena cava is dilated in size with <50% respiratory variability, suggesting right atrial pressure of 15 mmHg. FINDINGS  Left Ventricle: Left ventricular ejection fraction, by estimation, is 70 to 75%. The left ventricle has hyperdynamic function. The left ventricle has no regional wall motion abnormalities. The left ventricular internal cavity size was normal in size. There is mild left ventricular hypertrophy. Left ventricular diastolic parameters are consistent with Grade II  diastolic dysfunction (pseudonormalization). Elevated left atrial pressure. Right Ventricle: The right ventricular size  is mildly enlarged. Right vetricular wall thickness was not assessed. Right ventricular systolic function is mildly reduced. Left Atrium: Left atrial size was mildly dilated. Right Atrium: Right atrial size was normal in size. Pericardium: There is no evidence of pericardial effusion. Mitral Valve: The mitral valve is normal in structure. Mild mitral valve regurgitation. Tricuspid Valve: The tricuspid valve is normal in structure. Tricuspid valve regurgitation is mild. Aortic Valve: The aortic valve is normal in structure. Aortic valve regurgitation is not visualized. Aortic valve peak gradient measures 11.2 mmHg. Pulmonic Valve: The pulmonic valve was normal in structure. Pulmonic valve regurgitation is mild. Aorta: The aortic root and ascending aorta are structurally normal, with no evidence of dilitation. Venous: The inferior vena cava is dilated in size with less than 50% respiratory variability, suggesting right atrial pressure of 15 mmHg. IAS/Shunts: No atrial level shunt detected by color flow Doppler.  LEFT VENTRICLE PLAX 2D LVIDd:         4.20 cm   Diastology LVIDs:         2.70 cm   LV e' medial:    5.77 cm/s LV PW:         0.90 cm   LV E/e' medial:  22.9 LV IVS:        1.20 cm   LV e' lateral:   7.62 cm/s LVOT diam:     1.90 cm   LV E/e' lateral: 17.3 LV SV:         85 LV SV Index:   45 LVOT Area:     2.84 cm  RIGHT VENTRICLE            IVC RV S prime:     8.27 cm/s  IVC diam: 2.60 cm TAPSE (M-mode): 1.7 cm LEFT ATRIUM             Index        RIGHT ATRIUM           Index LA diam:        3.30 cm 1.73 cm/m   RA Area:     15.00 cm LA Vol (A2C):   49.3 ml 25.87 ml/m  RA Volume:   35.90 ml  18.84 ml/m LA Vol (A4C):   75.6 ml 39.67 ml/m LA Biplane Vol: 62.9 ml 33.01 ml/m  AORTIC VALVE AV Area (Vmax): 2.14 cm AV Vmax:        167.00 cm/s AV Peak Grad:   11.2 mmHg LVOT Vmax:      126.33  cm/s LVOT Vmean:     81.267 cm/s LVOT VTI:       0.299 m  AORTA Ao Root diam: 3.00 cm Ao Asc diam:  3.20 cm MITRAL VALVE                TRICUSPID VALVE MV Area (PHT): 3.77 cm     TR Peak grad:   49.6 mmHg MV Decel Time: 201 msec     TR Vmax:        352.00 cm/s MR Peak grad: 57.5 mmHg MR Vmax:      379.00 cm/s   SHUNTS MV E velocity: 132.00 cm/s  Systemic VTI:  0.30 m MV A velocity: 103.00 cm/s  Systemic Diam: 1.90 cm MV E/A ratio:  1.28 Dietrich Pates MD Electronically signed by Dietrich Pates MD Signature Date/Time: 04/09/2023/5:15:57 PM    Final    CT HEAD WO CONTRAST ( )  Result Date: 04/09/2023 CLINICAL DATA:  Mental status change, unknown cause. EXAM: CT  HEAD WITHOUT CONTRAST TECHNIQUE: Contiguous axial images were obtained from the base of the skull through the vertex without intravenous contrast. RADIATION DOSE REDUCTION: This exam was performed according to the departmental dose-optimization program which includes automated exposure control, adjustment of the mA and/or kV according to patient size and/or use of iterative reconstruction technique. COMPARISON:  07/31/2022. FINDINGS: Brain: No acute intracranial hemorrhage, midline shift or mass effect. No extra-axial fluid collection. Periventricular white matter hypodensities are noted bilaterally. No hydrocephalus. A stable dural-based is noted over the frontal bone on the right, possible calcified meningioma, and unchanged from the prior exam. Vascular: No hyperdense vessel or unexpected calcification. Skull: Normal. Negative for fracture or focal lesion. Sinuses/Orbits: No acute finding. Other: None. IMPRESSION: 1. No acute intracranial process. 2. Chronic microvascular ischemic changes. 3. Stable extra-axial calcification over the frontal lobe on the right, possible meningioma and unchanged from the previous exam. Electronically Signed   By: Thornell Sartorius M.D.   On: 04/09/2023 02:44   DG Chest Port 1 View  Result Date: 04/09/2023 CLINICAL DATA:   Shortness of breath EXAM: PORTABLE CHEST 1 VIEW COMPARISON:  08/16/2022 FINDINGS: Check shadow is enlarged but stable. Postsurgical changes are again seen. Elevation of the right hemidiaphragm is noted. Central vascular congestion is seen without significant edema. No focal infiltrate is noted. Postsurgical changes in the cervical spine are seen. IMPRESSION: Central vascular congestion without significant edema. Electronically Signed   By: Alcide Clever M.D.   On: 04/09/2023 01:24     TODAY-DAY OF DISCHARGE:  Subjective:   Prayer Fredricks today has no headache,no chest abdominal pain,no new weakness tingling or numbness, feels much better wants to go home today.   Objective:   Blood pressure (!) 153/73, pulse 75, temperature 97.8 F (36.6 C), resp. rate (!) 25, weight 93.6 kg, SpO2 94%.  Intake/Output Summary (Last 24 hours) at 04/22/2023 1005 Last data filed at 04/22/2023 0521 Gross per 24 hour  Intake --  Output 2000 ml  Net -2000 ml   Filed Weights   04/20/23 0500 04/21/23 0456 04/22/23 0421  Weight: 95.7 kg 93.5 kg 93.6 kg    Exam: Awake Alert, Oriented *3, No new F.N deficits, Normal affect [place].AT,PERRAL Supple Neck,No JVD, No cervical lymphadenopathy appriciated.  Symmetrical Chest wall movement, Good air movement bilaterally, CTAB RRR,No Gallops,Rubs or new Murmurs, No Parasternal Heave +ve B.Sounds, Abd Soft, Non tender, No organomegaly appriciated, No rebound -guarding or rigidity. No Cyanosis, Clubbing or edema, No new Rash or bruise   PERTINENT RADIOLOGIC STUDIES: No results found.   PERTINENT LAB RESULTS: CBC: Recent Labs    04/21/23 0857 04/22/23 0333  WBC 6.2 7.1  HGB 8.5* 8.2*  HCT 32.2* 30.7*  PLT 113* 127*   CMET CMP     Component Value Date/Time   NA 141 04/22/2023 0333   NA 146 (H) 09/24/2021 1603   K 3.6 04/22/2023 0333   CL 94 (L) 04/22/2023 0333   CO2 40 (H) 04/22/2023 0333   GLUCOSE 90 04/22/2023 0333   BUN 20 04/22/2023 0333   BUN 22  09/24/2021 1603   CREATININE 1.25 (H) 04/22/2023 0333   CALCIUM 9.7 04/22/2023 0333   PROT 6.0 (L) 04/09/2023 0122   ALBUMIN 3.2 (L) 04/09/2023 0122   AST 32 04/09/2023 0122   ALT 22 04/09/2023 0122   ALKPHOS 82 04/09/2023 0122   BILITOT 0.5 04/09/2023 0122   GFR 79.22 06/21/2013 1053   EGFR 54 (L) 09/24/2021 1603   GFRNONAA 42 (L) 04/22/2023 0347  GFR Estimated Creatinine Clearance: 33 mL/min (A) (by C-G formula based on SCr of 1.25 mg/dL (H)). No results for input(s): "LIPASE", "AMYLASE" in the last 72 hours. No results for input(s): "CKTOTAL", "CKMB", "CKMBINDEX", "TROPONINI" in the last 72 hours. Invalid input(s): "POCBNP" No results for input(s): "DDIMER" in the last 72 hours. No results for input(s): "HGBA1C" in the last 72 hours. No results for input(s): "CHOL", "HDL", "LDLCALC", "TRIG", "CHOLHDL", "LDLDIRECT" in the last 72 hours. No results for input(s): "TSH", "T4TOTAL", "T3FREE", "THYROIDAB" in the last 72 hours.  Invalid input(s): "FREET3" No results for input(s): "VITAMINB12", "FOLATE", "FERRITIN", "TIBC", "IRON", "RETICCTPCT" in the last 72 hours. Coags: No results for input(s): "INR" in the last 72 hours.  Invalid input(s): "PT" Microbiology: No results found for this or any previous visit (from the past 240 hour(s)).  FURTHER DISCHARGE INSTRUCTIONS:  Get Medicines reviewed and adjusted: Please take all your medications with you for your next visit with your Primary MD  Laboratory/radiological data: Please request your Primary MD to go over all hospital tests and procedure/radiological results at the follow up, please ask your Primary MD to get all Hospital records sent to his/her office.  In some cases, they will be blood work, cultures and biopsy results pending at the time of your discharge. Please request that your primary care M.D. goes through all the records of your hospital data and follows up on these results.  Also Note the following: If you  experience worsening of your admission symptoms, develop shortness of breath, life threatening emergency, suicidal or homicidal thoughts you must seek medical attention immediately by calling 911 or calling your MD immediately  if symptoms less severe.  You must read complete instructions/literature along with all the possible adverse reactions/side effects for all the Medicines you take and that have been prescribed to you. Take any new Medicines after you have completely understood and accpet all the possible adverse reactions/side effects.   Do not drive when taking Pain medications or sleeping medications (Benzodaizepines)  Do not take more than prescribed Pain, Sleep and Anxiety Medications. It is not advisable to combine anxiety,sleep and pain medications without talking with your primary care practitioner  Special Instructions: If you have smoked or chewed Tobacco  in the last 2 yrs please stop smoking, stop any regular Alcohol  and or any Recreational drug use.  Wear Seat belts while driving.  Please note: You were cared for by a hospitalist during your hospital stay. Once you are discharged, your primary care physician will handle any further medical issues. Please note that NO REFILLS for any discharge medications will be authorized once you are discharged, as it is imperative that you return to your primary care physician (or establish a relationship with a primary care physician if you do not have one) for your post hospital discharge needs so that they can reassess your need for medications and monitor your lab values.  Total Time spent coordinating discharge including counseling, education and face to face time equals greater than 30 minutes.  SignedJeoffrey Massed 04/22/2023 10:05 AM

## 2023-04-28 DIAGNOSIS — L89150 Pressure ulcer of sacral region, unstageable: Secondary | ICD-10-CM | POA: Diagnosis not present

## 2023-04-28 DIAGNOSIS — E119 Type 2 diabetes mellitus without complications: Secondary | ICD-10-CM | POA: Diagnosis not present

## 2023-04-28 DIAGNOSIS — E7800 Pure hypercholesterolemia, unspecified: Secondary | ICD-10-CM | POA: Diagnosis not present

## 2023-04-28 DIAGNOSIS — D649 Anemia, unspecified: Secondary | ICD-10-CM | POA: Diagnosis not present

## 2023-04-29 DIAGNOSIS — J9601 Acute respiratory failure with hypoxia: Secondary | ICD-10-CM | POA: Diagnosis not present

## 2023-04-29 DIAGNOSIS — D500 Iron deficiency anemia secondary to blood loss (chronic): Secondary | ICD-10-CM | POA: Diagnosis not present

## 2023-04-29 DIAGNOSIS — I480 Paroxysmal atrial fibrillation: Secondary | ICD-10-CM | POA: Diagnosis not present

## 2023-04-29 DIAGNOSIS — I5033 Acute on chronic diastolic (congestive) heart failure: Secondary | ICD-10-CM | POA: Diagnosis not present

## 2023-05-03 DIAGNOSIS — M6281 Muscle weakness (generalized): Secondary | ICD-10-CM | POA: Diagnosis not present

## 2023-05-03 DIAGNOSIS — G9341 Metabolic encephalopathy: Secondary | ICD-10-CM | POA: Diagnosis not present

## 2023-05-03 DIAGNOSIS — K922 Gastrointestinal hemorrhage, unspecified: Secondary | ICD-10-CM | POA: Diagnosis not present

## 2023-05-03 DIAGNOSIS — I5033 Acute on chronic diastolic (congestive) heart failure: Secondary | ICD-10-CM | POA: Diagnosis not present

## 2023-05-03 DIAGNOSIS — I480 Paroxysmal atrial fibrillation: Secondary | ICD-10-CM | POA: Diagnosis not present

## 2023-05-03 DIAGNOSIS — D649 Anemia, unspecified: Secondary | ICD-10-CM | POA: Diagnosis not present

## 2023-05-03 DIAGNOSIS — E119 Type 2 diabetes mellitus without complications: Secondary | ICD-10-CM | POA: Diagnosis not present

## 2023-05-03 DIAGNOSIS — J9602 Acute respiratory failure with hypercapnia: Secondary | ICD-10-CM | POA: Diagnosis not present

## 2023-05-03 DIAGNOSIS — R001 Bradycardia, unspecified: Secondary | ICD-10-CM | POA: Diagnosis not present

## 2023-05-05 DIAGNOSIS — E119 Type 2 diabetes mellitus without complications: Secondary | ICD-10-CM | POA: Diagnosis not present

## 2023-05-05 DIAGNOSIS — E7800 Pure hypercholesterolemia, unspecified: Secondary | ICD-10-CM | POA: Diagnosis not present

## 2023-05-05 DIAGNOSIS — D649 Anemia, unspecified: Secondary | ICD-10-CM | POA: Diagnosis not present

## 2023-05-05 DIAGNOSIS — L89153 Pressure ulcer of sacral region, stage 3: Secondary | ICD-10-CM | POA: Diagnosis not present

## 2023-05-12 DIAGNOSIS — E119 Type 2 diabetes mellitus without complications: Secondary | ICD-10-CM | POA: Diagnosis not present

## 2023-05-12 DIAGNOSIS — D649 Anemia, unspecified: Secondary | ICD-10-CM | POA: Diagnosis not present

## 2023-05-12 DIAGNOSIS — E7800 Pure hypercholesterolemia, unspecified: Secondary | ICD-10-CM | POA: Diagnosis not present

## 2023-05-12 DIAGNOSIS — L89153 Pressure ulcer of sacral region, stage 3: Secondary | ICD-10-CM | POA: Diagnosis not present

## 2023-05-20 DIAGNOSIS — I480 Paroxysmal atrial fibrillation: Secondary | ICD-10-CM | POA: Diagnosis not present

## 2023-05-20 DIAGNOSIS — K264 Chronic or unspecified duodenal ulcer with hemorrhage: Secondary | ICD-10-CM | POA: Diagnosis not present

## 2023-05-20 DIAGNOSIS — I10 Essential (primary) hypertension: Secondary | ICD-10-CM | POA: Diagnosis not present

## 2023-05-20 DIAGNOSIS — M6281 Muscle weakness (generalized): Secondary | ICD-10-CM

## 2023-05-20 DIAGNOSIS — D649 Anemia, unspecified: Secondary | ICD-10-CM | POA: Diagnosis not present

## 2023-05-20 DIAGNOSIS — R001 Bradycardia, unspecified: Secondary | ICD-10-CM | POA: Diagnosis not present

## 2023-05-20 DIAGNOSIS — J9602 Acute respiratory failure with hypercapnia: Secondary | ICD-10-CM | POA: Diagnosis not present

## 2023-05-20 DIAGNOSIS — I5033 Acute on chronic diastolic (congestive) heart failure: Secondary | ICD-10-CM | POA: Diagnosis not present

## 2023-05-20 DIAGNOSIS — R278 Other lack of coordination: Secondary | ICD-10-CM | POA: Diagnosis not present

## 2023-05-20 DIAGNOSIS — R2689 Other abnormalities of gait and mobility: Secondary | ICD-10-CM

## 2023-05-20 DIAGNOSIS — G9341 Metabolic encephalopathy: Secondary | ICD-10-CM | POA: Diagnosis not present

## 2023-05-24 DIAGNOSIS — J9602 Acute respiratory failure with hypercapnia: Secondary | ICD-10-CM | POA: Diagnosis not present

## 2023-05-24 DIAGNOSIS — D649 Anemia, unspecified: Secondary | ICD-10-CM | POA: Diagnosis not present

## 2023-05-24 DIAGNOSIS — R001 Bradycardia, unspecified: Secondary | ICD-10-CM | POA: Diagnosis not present

## 2023-05-24 DIAGNOSIS — I10 Essential (primary) hypertension: Secondary | ICD-10-CM | POA: Diagnosis not present

## 2023-05-24 DIAGNOSIS — K264 Chronic or unspecified duodenal ulcer with hemorrhage: Secondary | ICD-10-CM | POA: Diagnosis not present

## 2023-05-24 DIAGNOSIS — I480 Paroxysmal atrial fibrillation: Secondary | ICD-10-CM | POA: Diagnosis not present

## 2023-05-24 DIAGNOSIS — G9341 Metabolic encephalopathy: Secondary | ICD-10-CM | POA: Diagnosis not present

## 2023-05-24 DIAGNOSIS — I5033 Acute on chronic diastolic (congestive) heart failure: Secondary | ICD-10-CM | POA: Diagnosis not present

## 2023-05-24 DIAGNOSIS — F411 Generalized anxiety disorder: Secondary | ICD-10-CM | POA: Diagnosis not present

## 2023-05-25 DIAGNOSIS — G959 Disease of spinal cord, unspecified: Secondary | ICD-10-CM | POA: Diagnosis not present

## 2023-06-04 DIAGNOSIS — I480 Paroxysmal atrial fibrillation: Secondary | ICD-10-CM | POA: Diagnosis not present

## 2023-06-04 DIAGNOSIS — G9341 Metabolic encephalopathy: Secondary | ICD-10-CM | POA: Diagnosis not present

## 2023-06-04 DIAGNOSIS — F411 Generalized anxiety disorder: Secondary | ICD-10-CM | POA: Diagnosis not present

## 2023-06-04 DIAGNOSIS — D649 Anemia, unspecified: Secondary | ICD-10-CM | POA: Diagnosis not present

## 2023-06-04 DIAGNOSIS — J9602 Acute respiratory failure with hypercapnia: Secondary | ICD-10-CM | POA: Diagnosis not present

## 2023-06-04 DIAGNOSIS — I10 Essential (primary) hypertension: Secondary | ICD-10-CM | POA: Diagnosis not present

## 2023-06-04 DIAGNOSIS — R001 Bradycardia, unspecified: Secondary | ICD-10-CM | POA: Diagnosis not present

## 2023-06-04 DIAGNOSIS — I5033 Acute on chronic diastolic (congestive) heart failure: Secondary | ICD-10-CM | POA: Diagnosis not present

## 2023-06-21 DIAGNOSIS — I4819 Other persistent atrial fibrillation: Secondary | ICD-10-CM | POA: Diagnosis not present

## 2023-06-21 DIAGNOSIS — I130 Hypertensive heart and chronic kidney disease with heart failure and stage 1 through stage 4 chronic kidney disease, or unspecified chronic kidney disease: Secondary | ICD-10-CM | POA: Diagnosis not present

## 2023-06-21 DIAGNOSIS — E1122 Type 2 diabetes mellitus with diabetic chronic kidney disease: Secondary | ICD-10-CM | POA: Diagnosis not present

## 2023-06-21 DIAGNOSIS — I5033 Acute on chronic diastolic (congestive) heart failure: Secondary | ICD-10-CM | POA: Diagnosis not present

## 2023-06-21 DIAGNOSIS — N1832 Chronic kidney disease, stage 3b: Secondary | ICD-10-CM | POA: Diagnosis not present

## 2023-06-21 DIAGNOSIS — J9601 Acute respiratory failure with hypoxia: Secondary | ICD-10-CM | POA: Diagnosis not present

## 2023-06-21 DIAGNOSIS — D6869 Other thrombophilia: Secondary | ICD-10-CM | POA: Diagnosis not present

## 2023-06-21 DIAGNOSIS — Z8719 Personal history of other diseases of the digestive system: Secondary | ICD-10-CM | POA: Diagnosis not present

## 2023-06-25 DIAGNOSIS — G959 Disease of spinal cord, unspecified: Secondary | ICD-10-CM | POA: Diagnosis not present

## 2023-07-13 DIAGNOSIS — D649 Anemia, unspecified: Secondary | ICD-10-CM | POA: Diagnosis not present

## 2023-07-13 DIAGNOSIS — R195 Other fecal abnormalities: Secondary | ICD-10-CM | POA: Diagnosis not present

## 2023-07-22 DIAGNOSIS — J9601 Acute respiratory failure with hypoxia: Secondary | ICD-10-CM | POA: Diagnosis not present

## 2023-07-25 DIAGNOSIS — G959 Disease of spinal cord, unspecified: Secondary | ICD-10-CM | POA: Diagnosis not present

## 2023-08-21 DIAGNOSIS — J9601 Acute respiratory failure with hypoxia: Secondary | ICD-10-CM | POA: Diagnosis not present

## 2023-08-25 DIAGNOSIS — G959 Disease of spinal cord, unspecified: Secondary | ICD-10-CM | POA: Diagnosis not present

## 2023-09-21 DIAGNOSIS — I5032 Chronic diastolic (congestive) heart failure: Secondary | ICD-10-CM | POA: Diagnosis not present

## 2023-09-21 DIAGNOSIS — J9601 Acute respiratory failure with hypoxia: Secondary | ICD-10-CM | POA: Diagnosis not present

## 2023-09-21 DIAGNOSIS — N1832 Chronic kidney disease, stage 3b: Secondary | ICD-10-CM | POA: Diagnosis not present

## 2023-09-21 DIAGNOSIS — N2581 Secondary hyperparathyroidism of renal origin: Secondary | ICD-10-CM | POA: Diagnosis not present

## 2023-09-21 DIAGNOSIS — Z8719 Personal history of other diseases of the digestive system: Secondary | ICD-10-CM | POA: Diagnosis not present

## 2023-09-21 DIAGNOSIS — I4819 Other persistent atrial fibrillation: Secondary | ICD-10-CM | POA: Diagnosis not present

## 2023-09-21 DIAGNOSIS — E1122 Type 2 diabetes mellitus with diabetic chronic kidney disease: Secondary | ICD-10-CM | POA: Diagnosis not present

## 2023-09-21 DIAGNOSIS — F411 Generalized anxiety disorder: Secondary | ICD-10-CM | POA: Diagnosis not present

## 2023-09-21 DIAGNOSIS — D6869 Other thrombophilia: Secondary | ICD-10-CM | POA: Diagnosis not present

## 2023-09-21 DIAGNOSIS — I2510 Atherosclerotic heart disease of native coronary artery without angina pectoris: Secondary | ICD-10-CM | POA: Diagnosis not present

## 2023-09-21 DIAGNOSIS — I10 Essential (primary) hypertension: Secondary | ICD-10-CM | POA: Diagnosis not present

## 2023-09-21 DIAGNOSIS — E785 Hyperlipidemia, unspecified: Secondary | ICD-10-CM | POA: Diagnosis not present

## 2023-09-22 ENCOUNTER — Emergency Department (HOSPITAL_COMMUNITY): Payer: Medicare PPO

## 2023-09-22 ENCOUNTER — Inpatient Hospital Stay (HOSPITAL_COMMUNITY)
Admission: EM | Admit: 2023-09-22 | Discharge: 2023-09-29 | DRG: 291 | Disposition: A | Discharge status: Home Health | Payer: Medicare PPO | Attending: Internal Medicine | Admitting: Internal Medicine

## 2023-09-22 DIAGNOSIS — D61818 Other pancytopenia: Secondary | ICD-10-CM | POA: Diagnosis not present

## 2023-09-22 DIAGNOSIS — F419 Anxiety disorder, unspecified: Secondary | ICD-10-CM | POA: Diagnosis present

## 2023-09-22 DIAGNOSIS — J9621 Acute and chronic respiratory failure with hypoxia: Secondary | ICD-10-CM | POA: Diagnosis present

## 2023-09-22 DIAGNOSIS — E161 Other hypoglycemia: Secondary | ICD-10-CM | POA: Diagnosis not present

## 2023-09-22 DIAGNOSIS — L89153 Pressure ulcer of sacral region, stage 3: Secondary | ICD-10-CM | POA: Diagnosis present

## 2023-09-22 DIAGNOSIS — R531 Weakness: Secondary | ICD-10-CM | POA: Diagnosis not present

## 2023-09-22 DIAGNOSIS — E162 Hypoglycemia, unspecified: Secondary | ICD-10-CM | POA: Diagnosis not present

## 2023-09-22 DIAGNOSIS — Z981 Arthrodesis status: Secondary | ICD-10-CM

## 2023-09-22 DIAGNOSIS — E8809 Other disorders of plasma-protein metabolism, not elsewhere classified: Secondary | ICD-10-CM | POA: Diagnosis not present

## 2023-09-22 DIAGNOSIS — Z8679 Personal history of other diseases of the circulatory system: Secondary | ICD-10-CM

## 2023-09-22 DIAGNOSIS — E11649 Type 2 diabetes mellitus with hypoglycemia without coma: Secondary | ICD-10-CM | POA: Diagnosis present

## 2023-09-22 DIAGNOSIS — E7800 Pure hypercholesterolemia, unspecified: Secondary | ICD-10-CM | POA: Diagnosis present

## 2023-09-22 DIAGNOSIS — I10 Essential (primary) hypertension: Secondary | ICD-10-CM | POA: Diagnosis not present

## 2023-09-22 DIAGNOSIS — I5032 Chronic diastolic (congestive) heart failure: Secondary | ICD-10-CM | POA: Diagnosis not present

## 2023-09-22 DIAGNOSIS — I2510 Atherosclerotic heart disease of native coronary artery without angina pectoris: Secondary | ICD-10-CM | POA: Diagnosis present

## 2023-09-22 DIAGNOSIS — Y92239 Unspecified place in hospital as the place of occurrence of the external cause: Secondary | ICD-10-CM | POA: Diagnosis not present

## 2023-09-22 DIAGNOSIS — E876 Hypokalemia: Secondary | ICD-10-CM | POA: Diagnosis present

## 2023-09-22 DIAGNOSIS — J9622 Acute and chronic respiratory failure with hypercapnia: Secondary | ICD-10-CM | POA: Diagnosis not present

## 2023-09-22 DIAGNOSIS — R001 Bradycardia, unspecified: Secondary | ICD-10-CM | POA: Diagnosis not present

## 2023-09-22 DIAGNOSIS — L270 Generalized skin eruption due to drugs and medicaments taken internally: Secondary | ICD-10-CM | POA: Diagnosis not present

## 2023-09-22 DIAGNOSIS — I480 Paroxysmal atrial fibrillation: Secondary | ICD-10-CM

## 2023-09-22 DIAGNOSIS — I509 Heart failure, unspecified: Secondary | ICD-10-CM | POA: Diagnosis not present

## 2023-09-22 DIAGNOSIS — I080 Rheumatic disorders of both mitral and aortic valves: Secondary | ICD-10-CM | POA: Diagnosis present

## 2023-09-22 DIAGNOSIS — Z6841 Body Mass Index (BMI) 40.0 and over, adult: Secondary | ICD-10-CM

## 2023-09-22 DIAGNOSIS — N1832 Chronic kidney disease, stage 3b: Secondary | ICD-10-CM | POA: Diagnosis not present

## 2023-09-22 DIAGNOSIS — I4819 Other persistent atrial fibrillation: Secondary | ICD-10-CM | POA: Diagnosis present

## 2023-09-22 DIAGNOSIS — K31819 Angiodysplasia of stomach and duodenum without bleeding: Secondary | ICD-10-CM | POA: Diagnosis not present

## 2023-09-22 DIAGNOSIS — R0989 Other specified symptoms and signs involving the circulatory and respiratory systems: Secondary | ICD-10-CM | POA: Diagnosis not present

## 2023-09-22 DIAGNOSIS — J4489 Other specified chronic obstructive pulmonary disease: Secondary | ICD-10-CM | POA: Diagnosis present

## 2023-09-22 DIAGNOSIS — J9601 Acute respiratory failure with hypoxia: Secondary | ICD-10-CM

## 2023-09-22 DIAGNOSIS — Z7401 Bed confinement status: Secondary | ICD-10-CM | POA: Diagnosis not present

## 2023-09-22 DIAGNOSIS — E66813 Obesity, class 3: Secondary | ICD-10-CM | POA: Diagnosis present

## 2023-09-22 DIAGNOSIS — R5383 Other fatigue: Secondary | ICD-10-CM | POA: Diagnosis not present

## 2023-09-22 DIAGNOSIS — Z8674 Personal history of sudden cardiac arrest: Secondary | ICD-10-CM

## 2023-09-22 DIAGNOSIS — J9602 Acute respiratory failure with hypercapnia: Secondary | ICD-10-CM

## 2023-09-22 DIAGNOSIS — I130 Hypertensive heart and chronic kidney disease with heart failure and stage 1 through stage 4 chronic kidney disease, or unspecified chronic kidney disease: Secondary | ICD-10-CM | POA: Diagnosis not present

## 2023-09-22 DIAGNOSIS — T461X5A Adverse effect of calcium-channel blockers, initial encounter: Secondary | ICD-10-CM | POA: Diagnosis not present

## 2023-09-22 DIAGNOSIS — G959 Disease of spinal cord, unspecified: Secondary | ICD-10-CM | POA: Diagnosis not present

## 2023-09-22 DIAGNOSIS — D509 Iron deficiency anemia, unspecified: Secondary | ICD-10-CM | POA: Diagnosis not present

## 2023-09-22 DIAGNOSIS — Z90710 Acquired absence of both cervix and uterus: Secondary | ICD-10-CM

## 2023-09-22 DIAGNOSIS — Z888 Allergy status to other drugs, medicaments and biological substances status: Secondary | ICD-10-CM

## 2023-09-22 DIAGNOSIS — R0602 Shortness of breath: Secondary | ICD-10-CM | POA: Diagnosis not present

## 2023-09-22 DIAGNOSIS — I5033 Acute on chronic diastolic (congestive) heart failure: Principal | ICD-10-CM

## 2023-09-22 DIAGNOSIS — Z8249 Family history of ischemic heart disease and other diseases of the circulatory system: Secondary | ICD-10-CM

## 2023-09-22 DIAGNOSIS — N179 Acute kidney failure, unspecified: Secondary | ICD-10-CM | POA: Diagnosis present

## 2023-09-22 DIAGNOSIS — K5900 Constipation, unspecified: Secondary | ICD-10-CM | POA: Diagnosis present

## 2023-09-22 DIAGNOSIS — I5082 Biventricular heart failure: Secondary | ICD-10-CM | POA: Diagnosis present

## 2023-09-22 DIAGNOSIS — E870 Hyperosmolality and hypernatremia: Secondary | ICD-10-CM | POA: Diagnosis not present

## 2023-09-22 DIAGNOSIS — R195 Other fecal abnormalities: Secondary | ICD-10-CM | POA: Diagnosis not present

## 2023-09-22 DIAGNOSIS — D649 Anemia, unspecified: Secondary | ICD-10-CM

## 2023-09-22 DIAGNOSIS — N183 Chronic kidney disease, stage 3 unspecified: Secondary | ICD-10-CM | POA: Diagnosis not present

## 2023-09-22 DIAGNOSIS — Z7901 Long term (current) use of anticoagulants: Secondary | ICD-10-CM

## 2023-09-22 DIAGNOSIS — Z951 Presence of aortocoronary bypass graft: Secondary | ICD-10-CM

## 2023-09-22 DIAGNOSIS — E1129 Type 2 diabetes mellitus with other diabetic kidney complication: Secondary | ICD-10-CM | POA: Diagnosis present

## 2023-09-22 DIAGNOSIS — E1122 Type 2 diabetes mellitus with diabetic chronic kidney disease: Secondary | ICD-10-CM

## 2023-09-22 DIAGNOSIS — Z8719 Personal history of other diseases of the digestive system: Secondary | ICD-10-CM | POA: Diagnosis not present

## 2023-09-22 DIAGNOSIS — I5031 Acute diastolic (congestive) heart failure: Secondary | ICD-10-CM | POA: Diagnosis not present

## 2023-09-22 DIAGNOSIS — J90 Pleural effusion, not elsewhere classified: Secondary | ICD-10-CM | POA: Diagnosis not present

## 2023-09-22 DIAGNOSIS — N1831 Chronic kidney disease, stage 3a: Secondary | ICD-10-CM | POA: Diagnosis not present

## 2023-09-22 DIAGNOSIS — Z79899 Other long term (current) drug therapy: Secondary | ICD-10-CM

## 2023-09-22 DIAGNOSIS — I129 Hypertensive chronic kidney disease with stage 1 through stage 4 chronic kidney disease, or unspecified chronic kidney disease: Secondary | ICD-10-CM | POA: Diagnosis not present

## 2023-09-22 DIAGNOSIS — K31811 Angiodysplasia of stomach and duodenum with bleeding: Secondary | ICD-10-CM | POA: Diagnosis not present

## 2023-09-22 DIAGNOSIS — I110 Hypertensive heart disease with heart failure: Secondary | ICD-10-CM | POA: Diagnosis not present

## 2023-09-22 LAB — BASIC METABOLIC PANEL
Anion gap: 12 (ref 5–15)
BUN: 22 mg/dL (ref 8–23)
CO2: 39 mmol/L — ABNORMAL HIGH (ref 22–32)
Calcium: 9.7 mg/dL (ref 8.9–10.3)
Chloride: 94 mmol/L — ABNORMAL LOW (ref 98–111)
Creatinine, Ser: 1.04 mg/dL — ABNORMAL HIGH (ref 0.44–1.00)
GFR, Estimated: 52 mL/min — ABNORMAL LOW (ref >60)
Glucose, Bld: 74 mg/dL (ref 70–99)
Potassium: 3.2 mmol/L — ABNORMAL LOW (ref 3.5–5.1)
Sodium: 145 mmol/L (ref 135–145)

## 2023-09-22 LAB — BLOOD GAS, VENOUS
Acid-Base Excess: 17.5 mmol/L — ABNORMAL HIGH (ref 0.0–2.0)
Bicarbonate: 48 mmol/L — ABNORMAL HIGH (ref 20.0–28.0)
O2 Saturation: 84.9 %
Patient temperature: 37
pCO2, Ven: 89 mm[Hg] (ref 44–60)
pH, Ven: 7.34 (ref 7.25–7.43)
pO2, Ven: 54 mm[Hg] — ABNORMAL HIGH (ref 32–45)

## 2023-09-22 LAB — CBC WITH DIFFERENTIAL/PLATELET
Abs Immature Granulocytes: 0 10*3/uL (ref 0.00–0.07)
Abs Immature Granulocytes: 0 10*3/uL (ref 0.00–0.07)
Basophils Absolute: 0.1 10*3/uL (ref 0.0–0.1)
Basophils Absolute: 0 10*3/uL (ref 0.0–0.1)
Basophils Relative: 2 %
Basophils Relative: 0 %
Eosinophils Absolute: 0.1 10*3/uL (ref 0.0–0.5)
Eosinophils Absolute: 0 10*3/uL (ref 0.0–0.5)
Eosinophils Relative: 2 %
Eosinophils Relative: 2 %
HCT: 28.7 % — ABNORMAL LOW (ref 36.0–46.0)
HCT: 34.2 % — ABNORMAL LOW (ref 36.0–46.0)
Hemoglobin: 7.4 g/dL — ABNORMAL LOW (ref 12.0–15.0)
Hemoglobin: 8.7 g/dL — ABNORMAL LOW (ref 12.0–15.0)
Immature Granulocytes: 0 %
Lymphocytes Relative: 14 %
Lymphocytes Relative: 17 %
Lymphs Abs: 0.5 10*3/uL — ABNORMAL LOW (ref 0.7–4.0)
Lymphs Abs: 0.4 10*3/uL — ABNORMAL LOW (ref 0.7–4.0)
MCH: 23.3 pg — ABNORMAL LOW (ref 26.0–34.0)
MCH: 23.2 pg — ABNORMAL LOW (ref 26.0–34.0)
MCHC: 25.8 g/dL — ABNORMAL LOW (ref 30.0–36.0)
MCHC: 25.4 g/dL — ABNORMAL LOW (ref 30.0–36.0)
MCV: 90.5 fL (ref 80.0–100.0)
MCV: 91.2 fL (ref 80.0–100.0)
Monocytes Absolute: 0.2 10*3/uL (ref 0.1–1.0)
Monocytes Absolute: 0.3 10*3/uL (ref 0.1–1.0)
Monocytes Relative: 4 %
Monocytes Relative: 11 %
Neutro Abs: 3 10*3/uL (ref 1.7–7.7)
Neutro Abs: 1.6 10*3/uL — ABNORMAL LOW (ref 1.7–7.7)
Neutrophils Relative %: 78 %
Neutrophils Relative %: 70 %
Platelets: 102 10*3/uL — ABNORMAL LOW (ref 150–400)
Platelets: 67 10*3/uL — ABNORMAL LOW (ref 150–400)
RBC: 3.17 MIL/uL — ABNORMAL LOW (ref 3.87–5.11)
RBC: 3.75 MIL/uL — ABNORMAL LOW (ref 3.87–5.11)
RDW: 21.2 % — ABNORMAL HIGH (ref 11.5–15.5)
RDW: 21.2 % — ABNORMAL HIGH (ref 11.5–15.5)
WBC: 3.9 10*3/uL — ABNORMAL LOW (ref 4.0–10.5)
WBC: 2.3 10*3/uL — ABNORMAL LOW (ref 4.0–10.5)
nRBC: 0 % (ref 0.0–0.2)
nRBC: 0 /100{WBCs}
nRBC: 0 % (ref 0.0–0.2)

## 2023-09-22 LAB — I-STAT CHEM 8, ED
BUN: 29 mg/dL — ABNORMAL HIGH (ref 8–23)
Calcium, Ion: 1.25 mmol/L (ref 1.15–1.40)
Chloride: 92 mmol/L — ABNORMAL LOW (ref 98–111)
Creatinine, Ser: 1.5 mg/dL — ABNORMAL HIGH (ref 0.44–1.00)
Glucose, Bld: 102 mg/dL — ABNORMAL HIGH (ref 70–99)
HCT: 29 % — ABNORMAL LOW (ref 36.0–46.0)
Hemoglobin: 9.9 g/dL — ABNORMAL LOW (ref 12.0–15.0)
Potassium: 3.7 mmol/L (ref 3.5–5.1)
Sodium: 143 mmol/L (ref 135–145)
TCO2: 46 mmol/L — ABNORMAL HIGH (ref 22–32)

## 2023-09-22 LAB — RETICULOCYTES
Immature Retic Fract: 43.1 % — ABNORMAL HIGH (ref 2.3–15.9)
RBC.: 3.43 MIL/uL — ABNORMAL LOW (ref 3.87–5.11)
Retic Count, Absolute: 76.1 10*3/uL (ref 19.0–186.0)
Retic Ct Pct: 2.2 % (ref 0.4–3.1)

## 2023-09-22 LAB — VITAMIN B12: Vitamin B-12: 1413 pg/mL — ABNORMAL HIGH (ref 180–914)

## 2023-09-22 LAB — COMPREHENSIVE METABOLIC PANEL
ALT: 14 U/L (ref 0–44)
AST: 28 U/L (ref 15–41)
Albumin: 3.1 g/dL — ABNORMAL LOW (ref 3.5–5.0)
Alkaline Phosphatase: 102 U/L (ref 38–126)
Anion gap: 10 (ref 5–15)
BUN: 22 mg/dL (ref 8–23)
CO2: 38 mmol/L — ABNORMAL HIGH (ref 22–32)
Calcium: 9.6 mg/dL (ref 8.9–10.3)
Chloride: 96 mmol/L — ABNORMAL LOW (ref 98–111)
Creatinine, Ser: 1.26 mg/dL — ABNORMAL HIGH (ref 0.44–1.00)
GFR, Estimated: 42 mL/min — ABNORMAL LOW (ref >60)
Glucose, Bld: 100 mg/dL — ABNORMAL HIGH (ref 70–99)
Potassium: 3.7 mmol/L (ref 3.5–5.1)
Sodium: 144 mmol/L (ref 135–145)
Total Bilirubin: 0.6 mg/dL (ref 0.0–1.2)
Total Protein: 6.5 g/dL (ref 6.5–8.1)

## 2023-09-22 LAB — PROCALCITONIN: Procalcitonin: <0.1 ng/mL

## 2023-09-22 LAB — T4, FREE: Free T4: 0.95 ng/dL (ref 0.61–1.12)

## 2023-09-22 LAB — FOLATE: Folate: 35.1 ng/mL (ref >5.9)

## 2023-09-22 LAB — HEMOGLOBIN A1C
Hgb A1c MFr Bld: 5.4 % (ref 4.8–5.6)
Mean Plasma Glucose: 108.28 mg/dL

## 2023-09-22 LAB — IRON AND TIBC
Iron: 27 ug/dL — ABNORMAL LOW (ref 28–170)
Saturation Ratios: 7 % — ABNORMAL LOW (ref 10.4–31.8)
TIBC: 392 ug/dL (ref 250–450)
UIBC: 365 ug/dL

## 2023-09-22 LAB — MAGNESIUM: Magnesium: 2.6 mg/dL — ABNORMAL HIGH (ref 1.7–2.4)

## 2023-09-22 LAB — PHOSPHORUS: Phosphorus: 3.4 mg/dL (ref 2.5–4.6)

## 2023-09-22 LAB — CK: Total CK: 27 U/L — ABNORMAL LOW (ref 38–234)

## 2023-09-22 LAB — RESP PANEL BY RT-PCR (RSV, FLU A&B, COVID)  RVPGX2
Influenza A by PCR: NEGATIVE
Influenza B by PCR: NEGATIVE
Resp Syncytial Virus by PCR: NEGATIVE
SARS Coronavirus 2 by RT PCR: NEGATIVE

## 2023-09-22 LAB — TSH: TSH: 4.321 u[IU]/mL (ref 0.350–4.500)

## 2023-09-22 LAB — TROPONIN I (HIGH SENSITIVITY)
Troponin I (High Sensitivity): 17 ng/L (ref <18)
Troponin I (High Sensitivity): 17 ng/L (ref <18)

## 2023-09-22 LAB — OSMOLALITY: Osmolality: 312 mosm/kg — ABNORMAL HIGH (ref 275–295)

## 2023-09-22 LAB — CBG MONITORING, ED
Glucose-Capillary: 68 mg/dL — ABNORMAL LOW (ref 70–99)
Glucose-Capillary: 131 mg/dL — ABNORMAL HIGH (ref 70–99)

## 2023-09-22 LAB — BRAIN NATRIURETIC PEPTIDE: B Natriuretic Peptide: 1085.8 pg/mL — ABNORMAL HIGH (ref 0.0–100.0)

## 2023-09-22 LAB — FERRITIN: Ferritin: 14 ng/mL (ref 11–307)

## 2023-09-22 MED ORDER — INSULIN ASPART 100 UNIT/ML IJ SOLN: 0.0000 [IU] | INTRAMUSCULAR | Status: DC

## 2023-09-22 MED ORDER — FUROSEMIDE 10 MG/ML IJ SOLN
40.0000 mg | Freq: Once | INTRAMUSCULAR | Status: AC
  Administered 2023-09-22: 40 mg via INTRAVENOUS
  Filled 2023-09-22: qty 4

## 2023-09-22 MED ORDER — POTASSIUM CHLORIDE CRYS ER 20 MEQ PO TBCR
40.0000 meq | EXTENDED_RELEASE_TABLET | Freq: Once | ORAL | Status: AC
  Administered 2023-09-22: 40 meq via ORAL
  Filled 2023-09-22: qty 2

## 2023-09-22 NOTE — Assessment & Plan Note (Signed)
this patient has acute respiratory failure with Hypoxia and  Hypercarbia as documented by the presence of following: O2 saturatio< 90% on RA   pCO2 >50   Likely due to: Diastolic chf Provide O2 therapy and titrate as needed  Continuous pulse ox  Order BIPAP  flutter valve ordered

## 2023-09-22 NOTE — Assessment & Plan Note (Signed)
Somewhat chronic will need follow-up as an outpatient hemoglobin below 8 with history of CAD will transfuse as needed

## 2023-09-22 NOTE — Assessment & Plan Note (Signed)
Continue Crestor 20 mg a day

## 2023-09-22 NOTE — Subjective & Objective (Signed)
Patient was brought in with fatigue for the past 5 days Primary care provider did some blood work yesterday that showed hemoglobin 7.491 platelets Blood pressure 124/60 satting 93% on home 3 L Patient still endorsing shortness of breath has had problems with anemia in the past has been using Eliquis Reports no black stools no blood in stools but very short of breath when walking no cough no fevers no chest pain no nausea vomiting or diarrhea

## 2023-09-22 NOTE — Assessment & Plan Note (Signed)
DC sliding scale check CBG every 2 hours correct as needed

## 2023-09-22 NOTE — ED Triage Notes (Addendum)
[name]

## 2023-09-22 NOTE — Assessment & Plan Note (Signed)
-   Pt diagnosed with CHF based on presence of the following:  PND, OA, rales on exam, JVD, cardiomegaly, Pulmonary edema on CXR, and   bilateral leg edema,  , pleural effusion  With noted response to IV diuretic in ER  admit on telemetry,  cycle cardiac enzymes, Cardiac Panel (last 3 results) Recent Labs    09/22/23 1725 09/22/23 1843  TROPONINIHS 17 17     obtain serial ECG  to evaluate for ischemia as a cause of heart failure  monitor daily weight:   Last BNP BNP (last 3 results) Recent Labs    04/18/23 0855 04/19/23 0407 09/22/23 1725  BNP 1,613.0* 993.0* 1,085.8*      diurese with IV lasix   40 mg IV  Daily and monitor orthostatics and creatinine to avoid over diuresis.  Order echogram to evaluate EF and valves  ACE/ARBi Contraindicated    cardiology consulted

## 2023-09-22 NOTE — Assessment & Plan Note (Signed)
Check Hemoccult stool given somewhat worsening anemia May need to hold Eliquis and repeat endoscopy Patient has been seen in the past by Dr. Dulce Sellar

## 2023-09-22 NOTE — ED Notes (Addendum)
RN aware of CBG, pt given bag lunch with apple juice.

## 2023-09-22 NOTE — Assessment & Plan Note (Signed)
Hold Toprol check TSH Attempt to obtain rhythm strip will need cardiology assessment and consult Repeat echo

## 2023-09-22 NOTE — ED Provider Triage Note (Signed)
Emergency Medicine Provider Triage Evaluation Note  CAILIN GEBEL , a 89 y.o. female  was evaluated in triage.  Pt complains of fatigue.  She tells me this is been sick.  Not sure for how long.  Had some nausea and vomiting at the onset and has been coughing a bit.  She had seen her family doctor yesterday.  There is some blood work drawn.  Reportedly was abnormal and she was sent here for evaluation.  She says that her daughter is worried about her stool color.  She had seen her GI doctor earlier this week.  Review of Systems  Positive: Fatigue Negative: Abdominal pain nausea vomiting  Physical Exam  There were no vitals taken for this visit. Gen:   Awake, no distress   Resp:  Normal effort  MSK:   Moves extremities without difficulty  Other:    Medical Decision Making  Medically screening exam initiated at 10:24 AM.  Appropriate orders placed.  KEERA ALTIDOR was informed that the remainder of the evaluation will be completed by another provider, this initial triage assessment does not replace that evaluation, and the importance of remaining in the ED until their evaluation is complete.  Patient seen by her PCP yesterday, blood work was reportedly abnormal.  Based on the description from EMS it seems pretty close to her baseline.  Will repeat labs here.   Melene Plan, DO 09/22/23 1025

## 2023-09-22 NOTE — Assessment & Plan Note (Signed)
-  chronic avoid nephrotoxic medications such as NSAIDs, Vanco Zosyn combo,  avoid hypotension, continue to follow renal function

## 2023-09-22 NOTE — Assessment & Plan Note (Signed)
Hold aspirin given worsening anemia.  Transfuse for hemoglobin below 8

## 2023-09-22 NOTE — ED Provider Notes (Signed)
[place] EMERGENCY DEPARTMENT AT [facility] Provider Note   CSN: [id number] Arrival date & time: 09/22/23  1013     History  Chief Complaint  Patient presents with   Shortness of Breath   Fatigue    Erika Gates is a 89 y.o. female.  HPI     89yo female with history of htn, hlpd, type 2 DM, PAF, CAD s/p CABG, anemia/duodenal ulcer, admission in August for heart failure and acute blood loss anemia who presents with concern for shortness of breath and anemia from PCP.  On home O2 2L, increased since then   Hx of episodes of anemia in the past, on eliquis No black or bloody stools  Walks very short distances and dyspnea No cough, no fever, no chest pain Not quite a week of swelling Orthopnea On 20mg  lasix. Is a few lbs up No n/v/diarrhea  1.2024 was 10.3, August was 8.  Was 7.9 in October  Shortness of breath since Friday  Past Medical History:  Diagnosis Date   Allergic rhinitis    Anemia    Anxiety    Coronary atherosclerosis of unspecified type of vessel, native or graft    Diabetes mellitus    DJD (degenerative joint disease)    Hypercholesteremia    Hypertension    Low back pain    Morbid obesity (HCC)    Venous insufficiency    Vitamin D deficiency      Home Medications Prior to Admission medications   Medication Sig Start Date End Date Taking? Authorizing Provider  acetaminophen (TYLENOL) 325 MG tablet Take 650 mg by mouth 2 (two) times daily as needed for moderate pain, fever or headache.   Yes [provider]  ammonium lactate (LAC-HYDRIN) 12 % lotion Apply 1 Application topically daily as needed for dry skin.   Yes [provider]  apixaban (ELIQUIS) 5 MG TABS tablet Take 1 tablet (5 mg total) by mouth 2 (two) times daily. 11/23/22  Yes Dyann Kief, PA-C  Cholecalciferol (VITAMIN D-3 PO) Take 1 tablet by mouth daily.   Yes [provider]  clonazePAM (KLONOPIN) 0.5 MG tablet Take 0.5 mg by mouth 2  (two) times daily. 09/22/23  Yes [provider]  dapagliflozin propanediol (FARXIGA) 10 MG TABS tablet Take 1 tablet (10 mg total) by mouth daily before breakfast. 02/02/22  Yes Lyn Records, MD  diclofenac sodium (VOLTAREN) 1 % GEL Apply 2 g topically daily as needed (pain). 08/27/22  Yes Rai, Ripudeep K, MD  docusate sodium (COLACE) 100 MG capsule Take 1 capsule (100 mg total) by mouth 2 (two) times daily. Patient taking differently: Take 100 mg by mouth daily as needed (constipation). 08/27/22  Yes Rai, Ripudeep K, MD  furosemide (LASIX) 20 MG tablet Take 1 tablet (20 mg total) by mouth daily. 07/18/21 09/22/23 Yes Meredeth Ide, MD  gabapentin (NEURONTIN) 100 MG capsule Take 1 capsule (100 mg total) by mouth at bedtime. Patient taking differently: Take 100 mg by mouth daily. 03/17/18  Yes Gearldine Bienenstock, PA-C  lidocaine 4 % Place 1 patch onto the skin daily as needed (pain). 09/25/22  Yes [provider]  lisinopril (ZESTRIL) 20 MG tablet Take 1 tablet (20 mg total) by mouth daily. 07/18/21 09/22/23 Yes Meredeth Ide, MD  metoprolol succinate (TOPROL-XL) 100 MG 24 hr tablet Take 1 tablet (100 mg total) by mouth daily. 10/27/22  Yes Dyann Kief, PA-C  Multiple Vitamins-Minerals (MULTIVITAMIN WOMEN 50+) TABS Take  1 tablet by mouth daily.   Yes [provider]  pantoprazole (PROTONIX) 40 MG tablet Take 1 tablet (40 mg total) by mouth daily. 08/27/22  Yes Rai, Ripudeep K, MD  polyethylene glycol (MIRALAX / GLYCOLAX) 17 g packet Take 17 g by mouth daily as needed (constipation). 09/25/22  Yes [provider]  rosuvastatin (CRESTOR) 20 MG tablet Take 1 tablet (20 mg total) by mouth daily. 01/12/23  Yes Jake Bathe, MD  VITAMIN E PO Take 1 capsule by mouth daily.   Yes [provider]      Allergies    Cardizem [diltiazem] and Glucophage [metformin]    Review of Systems   Review of Systems  Physical Exam Updated Vital Signs BP 121/77 (BP Location: Left  Arm)   Pulse 61   Temp 97.7 F (36.5 C) (Oral)   Resp 16   Ht 5' (1.524 m)   Wt 85.9 kg   SpO2 98%   BMI 36.98 kg/m  Physical Exam Vitals and nursing note reviewed.  Constitutional:      General: She is not in acute distress.    Appearance: She is well-developed. She is not diaphoretic.  HENT:     Head: Normocephalic and atraumatic.  Eyes:     Conjunctiva/sclera: Conjunctivae normal.  Neck:     Vascular: JVD present.  Cardiovascular:     Rate and Rhythm: Normal rate and regular rhythm.     Heart sounds: Normal heart sounds. No murmur heard.    No friction rub. No gallop.  Pulmonary:     Effort: Pulmonary effort is normal. No respiratory distress.     Breath sounds: Normal breath sounds. No wheezing or rales.  Abdominal:     General: There is no distension.     Palpations: Abdomen is soft.     Tenderness: There is no abdominal tenderness. There is no guarding.  Musculoskeletal:        General: No tenderness.     Cervical back: Normal range of motion.     Right lower leg: Edema present.     Left lower leg: Edema present.  Skin:    General: Skin is warm and dry.     Findings: No erythema or rash.  Neurological:     Mental Status: She is alert and oriented to person, place, and time.     ED Results / Procedures / Treatments   Labs (all labs ordered are listed, but only abnormal results are displayed) Labs Reviewed  CBC WITH DIFFERENTIAL/PLATELET - Abnormal; Notable for the following components:      Result Value   WBC 3.9 (*)    RBC 3.17 (*)    Hemoglobin 7.4 (*)    HCT 28.7 (*)    MCH 23.3 (*)    MCHC 25.8 (*)    RDW 21.2 (*)    Platelets 102 (*)    Lymphs Abs 0.5 (*)    All other components within normal limits  COMPREHENSIVE METABOLIC PANEL - Abnormal; Notable for the following components:   Chloride 96 (*)    CO2 38 (*)    Glucose, Bld 100 (*)    Creatinine, Ser 1.26 (*)    Albumin 3.1 (*)    GFR, Estimated 42 (*)    All other components within  normal limits  BRAIN NATRIURETIC PEPTIDE - Abnormal; Notable for the following components:   B Natriuretic Peptide 1,085.8 (*)    All other components within normal limits  VITAMIN B12 - Abnormal;  Notable for the following components:   Vitamin B-12 1,413 (*)    All other components within normal limits  IRON AND TIBC - Abnormal; Notable for the following components:   Iron 27 (*)    Saturation Ratios 7 (*)    All other components within normal limits  RETICULOCYTES - Abnormal; Notable for the following components:   RBC. 3.43 (*)    Immature Retic Fract 43.1 (*)    All other components within normal limits  CBC WITH DIFFERENTIAL/PLATELET - Abnormal; Notable for the following components:   WBC 2.3 (*)    RBC 3.75 (*)    Hemoglobin 8.7 (*)    HCT 34.2 (*)    MCH 23.2 (*)    MCHC 25.4 (*)    RDW 21.2 (*)    Platelets 67 (*)    Neutro Abs 1.6 (*)    Lymphs Abs 0.4 (*)    All other components within normal limits  MAGNESIUM - Abnormal; Notable for the following components:   Magnesium 2.6 (*)    All other components within normal limits  BLOOD GAS, VENOUS - Abnormal; Notable for the following components:   pCO2, Ven 89 (*)    pO2, Ven 54 (*)    Bicarbonate 48.0 (*)    Acid-Base Excess 17.5 (*)    All other components within normal limits  BASIC METABOLIC PANEL - Abnormal; Notable for the following components:   Potassium 3.2 (*)    Chloride 94 (*)    CO2 39 (*)    Creatinine, Ser 1.04 (*)    GFR, Estimated 52 (*)    All other components within normal limits  CK - Abnormal; Notable for the following components:   Total CK 27 (*)    All other components within normal limits  OSMOLALITY - Abnormal; Notable for the following components:   Osmolality 312 (*)    All other components within normal limits  OSMOLALITY, URINE - Abnormal; Notable for the following components:   Osmolality, Ur 285 (*)    All other components within normal limits  URINALYSIS, COMPLETE (UACMP) WITH  MICROSCOPIC - Abnormal; Notable for the following components:   Color, Urine COLORLESS (*)    Specific Gravity, Urine 1.002 (*)    Glucose, UA >=500 (*)    All other components within normal limits  COMPREHENSIVE METABOLIC PANEL - Abnormal; Notable for the following components:   Sodium 148 (*)    Chloride 96 (*)    CO2 44 (*)    Creatinine, Ser 1.03 (*)    Total Protein 6.3 (*)    Albumin 2.9 (*)    GFR, Estimated 53 (*)    All other components within normal limits  CBC - Abnormal; Notable for the following components:   WBC 3.2 (*)    RBC 2.88 (*)    Hemoglobin 6.8 (*)    HCT 26.1 (*)    MCH 23.6 (*)    MCHC 26.1 (*)    RDW 21.2 (*)    Platelets 95 (*)    All other components within normal limits  GLUCOSE, CAPILLARY - Abnormal; Notable for the following components:   Glucose-Capillary 116 (*)    All other components within normal limits  HEMOGLOBIN AND HEMATOCRIT, BLOOD - Abnormal; Notable for the following components:   Hemoglobin 7.8 (*)    HCT 28.0 (*)    All other components within normal limits  CBC WITH DIFFERENTIAL/PLATELET - Abnormal; Notable for the following components:   WBC 3.6 (*)  RBC 3.19 (*)    Hemoglobin 7.8 (*)    HCT 28.3 (*)    MCH 24.5 (*)    MCHC 27.6 (*)    RDW 20.4 (*)    Platelets 94 (*)    Lymphs Abs 0.4 (*)    All other components within normal limits  BASIC METABOLIC PANEL - Abnormal; Notable for the following components:   Sodium 146 (*)    Potassium 3.3 (*)    Chloride 93 (*)    CO2 45 (*)    GFR, Estimated 56 (*)    All other components within normal limits  GLUCOSE, CAPILLARY - Abnormal; Notable for the following components:   Glucose-Capillary 127 (*)    All other components within normal limits  GLUCOSE, CAPILLARY - Abnormal; Notable for the following components:   Glucose-Capillary 147 (*)    All other components within normal limits  I-STAT CHEM 8, ED - Abnormal; Notable for the following components:   Chloride 92 (*)     BUN 29 (*)    Creatinine, Ser 1.50 (*)    Glucose, Bld 102 (*)    TCO2 46 (*)    Hemoglobin 9.9 (*)    HCT 29.0 (*)    All other components within normal limits  CBG MONITORING, ED - Abnormal; Notable for the following components:   Glucose-Capillary 68 (*)    All other components within normal limits  CBG MONITORING, ED - Abnormal; Notable for the following components:   Glucose-Capillary 131 (*)    All other components within normal limits  CBG MONITORING, ED - Abnormal; Notable for the following components:   Glucose-Capillary 135 (*)    All other components within normal limits  CBG MONITORING, ED - Abnormal; Notable for the following components:   Glucose-Capillary 129 (*)    All other components within normal limits  RESP PANEL BY RT-PCR (RSV, FLU A&B, COVID)  RVPGX2  MRSA NEXT GEN BY PCR, NASAL  FOLATE  FERRITIN  PROCALCITONIN  T4, FREE  T3  TSH  PHOSPHORUS  CREATININE, URINE, RANDOM  SODIUM, URINE, RANDOM  HEMOGLOBIN A1C  MAGNESIUM  PHOSPHORUS  GLUCOSE, CAPILLARY  GLUCOSE, CAPILLARY  POC OCCULT BLOOD, ED  CBG MONITORING, ED  CBG MONITORING, ED  CBG MONITORING, ED  TYPE AND SCREEN  PREPARE RBC (CROSSMATCH)  TROPONIN I (HIGH SENSITIVITY)  TROPONIN I (HIGH SENSITIVITY)    EKG EKG Interpretation Date/Time:  Wednesday September 22 2023 21:06:33 EST Ventricular Rate:  51 PR Interval:  144 QRS Duration:  148 QT Interval:  496 QTC Calculation: 457 R Axis:   11  Text Interpretation: Poor data quality Confirmed by Gerhard Munch [phone]) on 09/23/2023 10:23:18 PM  Radiology ECHOCARDIOGRAM COMPLETE Result Date: 09/23/2023    ECHOCARDIOGRAM REPORT   Patient Name:   AERIS HERSMAN Date of Exam: 09/23/2023 Medical Rec #:  [id number]        Height:       60.0 in Accession #:    [id number]       Weight:       206.3 lb Date of Birth:  [birth year]        BSA:          1.892 m Patient Age:    88 years         BP:           129/54 mmHg Patient Gender: F                HR:  58 bpm. Exam Location:  Inpatient Procedure: 2D Echo, Cardiac Doppler and Color Doppler Indications:    CHF-Acute Diastolic I50.31  History:        Patient has prior history of Echocardiogram examinations, most                 recent 04/09/2023. CAD, Prior CABG; Risk Factors:Diabetes and                 Hypertension.  Sonographer:    Darlys Gales Referring Phys: Therisa Doyne IMPRESSIONS  1. Left ventricular ejection fraction, by estimation, is 60 to 65%. The left ventricle has normal function. The left ventricle has no regional wall motion abnormalities. The left ventricular internal cavity size was mildly dilated. Left ventricular diastolic parameters are indeterminate. Elevated left ventricular end-diastolic pressure.  2. Right ventricular systolic function is moderately reduced. The right ventricular size is severely enlarged.  3. Right atrial size was mildly dilated.  4. The mitral valve is degenerative. Moderate mitral valve regurgitation. No evidence of mitral stenosis.  5. The aortic valve is tricuspid. Aortic valve regurgitation is not visualized. Aortic valve sclerosis/calcification is present, without any evidence of aortic stenosis. Aortic valve area, by VTI measures 2.18 cm. Aortic valve mean gradient measures 5.0 mmHg. Aortic valve Vmax measures 1.48 m/s. FINDINGS  Left Ventricle: Left ventricular ejection fraction, by estimation, is 60 to 65%. The left ventricle has normal function. The left ventricle has no regional wall motion abnormalities. The left ventricular internal cavity size was mildly dilated. There is  no left ventricular hypertrophy. Left ventricular diastolic parameters are indeterminate. Elevated left ventricular end-diastolic pressure. Right Ventricle: The right ventricular size is severely enlarged. No increase in right ventricular wall thickness. Right ventricular systolic function is moderately reduced. Left Atrium: Left atrial size was normal in size. Right Atrium:  Right atrial size was mildly dilated. Pericardium: There is no evidence of pericardial effusion. Mitral Valve: The mitral valve is degenerative in appearance. There is mild calcification of the mitral valve leaflet(s). Moderate mitral valve regurgitation. No evidence of mitral valve stenosis. Tricuspid Valve: The tricuspid valve is normal in structure. Tricuspid valve regurgitation is mild . No evidence of tricuspid stenosis. Aortic Valve: The aortic valve is tricuspid. Aortic valve regurgitation is not visualized. Aortic valve sclerosis/calcification is present, without any evidence of aortic stenosis. Aortic valve mean gradient measures 5.0 mmHg. Aortic valve peak gradient measures 8.8 mmHg. Aortic valve area, by VTI measures 2.18 cm. Pulmonic Valve: The pulmonic valve was normal in structure. Pulmonic valve regurgitation is trivial. No evidence of pulmonic stenosis. Aorta: The aortic root is normal in size and structure. Venous: The inferior vena cava was not well visualized. IAS/Shunts: No atrial level shunt detected by color flow Doppler.  LEFT VENTRICLE PLAX 2D LVIDd:         5.40 cm   Diastology LVIDs:         3.00 cm   LV e' medial:    5.55 cm/s LV PW:         0.90 cm   LV E/e' medial:  22.7 LV IVS:        0.70 cm   LV e' lateral:   7.51 cm/s LVOT diam:     1.80 cm   LV E/e' lateral: 16.8 LV SV:         78 LV SV Index:   41 LVOT Area:     2.54 cm  RIGHT VENTRICLE RV Basal diam:  4.90 cm RV Mid diam:  4.20 cm RV S prime:     8.05 cm/s TAPSE (M-mode): 1.5 cm LEFT ATRIUM             Index        RIGHT ATRIUM           Index LA Vol (A2C):   73.3 ml 38.75 ml/m  RA Area:     19.40 cm LA Vol (A4C):   49.5 ml 26.17 ml/m  RA Volume:   58.80 ml  31.09 ml/m LA Biplane Vol: 61.9 ml 32.72 ml/m  AORTIC VALVE AV Area (Vmax):    1.94 cm AV Area (Vmean):   2.13 cm AV Area (VTI):     2.18 cm AV Vmax:           148.00 cm/s AV Vmean:          99.100 cm/s AV VTI:            0.358 m AV Peak Grad:      8.8 mmHg AV Mean  Grad:      5.0 mmHg LVOT Vmax:         113.00 cm/s LVOT Vmean:        82.900 cm/s LVOT VTI:          0.306 m LVOT/AV VTI ratio: 0.85  AORTA Ao Root diam: 2.90 cm MITRAL VALVE                  TRICUSPID VALVE MV Area (PHT): 3.46 cm       TR Peak grad:   55.4 mmHg MV Decel Time: 219 msec       TR Vmax:        372.00 cm/s MR Peak grad:    122.3 mmHg MR Mean grad:    90.0 mmHg    SHUNTS MR Vmax:         553.00 cm/s  Systemic VTI:  0.31 m MR Vmean:        458.0 cm/s   Systemic Diam: 1.80 cm MR PISA:         6.28 cm MR PISA Eff ROA: 43 mm MR PISA Radius:  1.00 cm MV E velocity: 126.00 cm/s MV A velocity: 125.00 cm/s MV E/A ratio:  1.01 Armanda Magic MD Electronically signed by Armanda Magic MD Signature Date/Time: 09/23/2023/10:08:55 AM    Final    DG Chest 2 View Result Date: 09/22/2023 CLINICAL DATA:  Shortness of breath and weakness for 5 days EXAM: CHEST - 2 VIEW COMPARISON:  X-ray 04/17/2023 and older FINDINGS: Status post median sternotomy. Enlarged cardiopericardial silhouette calcified aorta. Vascular congestion. Elevation of the right hemidiaphragm. Small pleural effusions. Left midlung atelectasis. Film is under penetrated. Degenerative changes noted along the spine and shoulders. Fixation hardware seen along the lower cervical spine at the edge of the imaging field. IMPRESSION: Postop chest with enlarged heart and vascular congestion. Small effusions. Right greater than left. Under penetrated radiograph Electronically Signed   By: Karen Kays M.D.   On: 09/22/2023 17:08    Procedures Procedures    Medications Ordered in ED Medications  clonazePAM (KLONOPIN) tablet 0.5 mg (0.5 mg Oral Given 09/24/23 1029)  docusate sodium (COLACE) capsule 100 mg (has no administration in time range)  pantoprazole (PROTONIX) injection 40 mg (40 mg Intravenous Given 09/24/23 1030)  rosuvastatin (CRESTOR) tablet 20 mg (20 mg Oral Given 09/24/23 1030)  acetaminophen (TYLENOL) tablet 650 mg (has no administration in  time range)    Or  acetaminophen (TYLENOL) suppository  650 mg (has no administration in time range)  ondansetron (ZOFRAN) tablet 4 mg (has no administration in time range)    Or  ondansetron (ZOFRAN) injection 4 mg (has no administration in time range)  liver oil-zinc oxide (DESITIN) 40 % ointment ( Topical Given 09/24/23 1153)  furosemide (LASIX) injection 40 mg (40 mg Intravenous Given 09/24/23 0810)  leptospermum manuka honey (MEDIHONEY) paste 1 Application (1 Application Topical Given 09/24/23 1153)  metoprolol succinate (TOPROL-XL) 24 hr tablet 25 mg (25 mg Oral Given 09/24/23 1029)  losartan (COZAAR) tablet 25 mg (25 mg Oral Given 09/24/23 1029)  insulin aspart (novoLOG) injection 0-15 Units ( Subcutaneous Not Given 09/24/23 0635)  potassium chloride SA (KLOR-CON M) CR tablet 40 mEq (40 mEq Oral Given 09/24/23 1029)  furosemide (LASIX) injection 40 mg (40 mg Intravenous Given 09/22/23 2102)  potassium chloride SA (KLOR-CON M) CR tablet 40 mEq (40 mEq Oral Given 09/22/23 2302)  0.9 %  sodium chloride infusion (Manually program via Guardrails IV Fluids) (0 mLs Intravenous Stopped 09/23/23 1615)    ED Course/ Medical Decision Making/ A&P                                  [id number] female with history of htn, hlpd, type 2 DM, PAF, CAD s/p CABG, anemia/duodenal ulcer, admission in August for heart failure and acute blood loss anemia who presents with concern for shortness of breath and anemia from PCP.  Differential diagnosis for dyspnea includes ACS, PE, COPD exacerbation, CHF exacerbation, anemia, pneumonia, viral etiology such as COVID 19 infection, metabolic abnormality.    Chest x-ray was done which showed vascular congestion, effusions.   EKG was evaluated by me with significant artifact and appearance of sinus bradycardia.    Labs completed and personally evaluated by me. Hgb 7.4 from 8.2 after hospitalization in August---family reports it was 7.9 in October.BNP was 1085 increased from 993  previously.  Troponin normal, doubt ACS.  Does not have symptoms to suggest acute GI bleed.  Has had anemia over last few months per history from family.  At this time, appears volume overloaded on exam.  Will order lasix for diuresis, consider transfusion after diuresis and pending repeat hgb.   Admitted for further care of CHF and anemia.            Final Clinical Impression(s) / ED Diagnoses Final diagnoses:  Acute on chronic congestive heart failure, unspecified heart failure type (HCC)  Anemia, unspecified type    Rx / DC Orders ED Discharge Orders     None         Alvira Monday, MD 09/24/23 1242

## 2023-09-22 NOTE — Assessment & Plan Note (Signed)
-   Order Sensitive  SSI

## 2023-09-22 NOTE — ED Triage Notes (Signed)
Pt bib ptar with reports of weakness and fatigue since Friday. Pcp did blood work and yesterday labs came back with 7.44hgb, 91 platelet, 26.3 hematocrit, high bicarb. VSS.  124/60 93% on home 3L 96%  CBG 139

## 2023-09-22 NOTE — Assessment & Plan Note (Signed)
Allow permissive hypertension for tonight

## 2023-09-22 NOTE — H&P (Signed)
Erika Gates:[id number] DOB: 1934/09/15 DOA: 09/22/2023     PCP: Eloisa Northern, MD   Outpatient Specialists:   CARDS:   Dr. Lesleigh Noe, MD (Inactive)    Patient arrived to ER on 09/22/23 at 1013 Referred by Attending Alvira Monday, MD   Patient coming from:    home Lives  With family     Chief Complaint:   Chief Complaint  Patient presents with   Shortness of Breath   Fatigue    HPI: Erika Gates is a 89 y.o. female with medical history significant of HTN, HLD, DM-2, PAF on Eliquis, CAD s/p CABG 1996, chronic HFpEF, PUD,  Sinus bradycardia, Pancytopenia, CKD 3a    Presented with  fatigue increased SOB Patient was brought in with fatigue for the past 5 days Primary care provider did some blood work yesterday that showed hemoglobin 7.491 platelets Blood pressure 124/60 satting 93% on home 3 L Patient still endorsing shortness of breath has had problems with anemia in the past has been using Eliquis Reports no black stools no blood in stools but very short of breath when walking no cough no fevers no chest pain no nausea vomiting or diarrhea   Last EGD was done August 2024 showed erosive gastropathy nonbleeding duodenal ulcer  During prior admit Reportedly heart rate in the 30s when initially presented to EMS TSH borderline-suggestive of sick euthyroid syndrome Since heart rate rebounded-beta-blocker has been resumed without any major issues   Denies significant ETOH intake   Does not smoke   Lab Results  Component Value Date   SARSCOV2NAA NEGATIVE 04/09/2023   SARSCOV2NAA NEGATIVE 08/25/2022   SARSCOV2NAA NEGATIVE 08/10/2022   SARSCOV2NAA NEGATIVE 07/14/2021        Regarding pertinent Chronic problems:     Hyperlipidemia - on statins crestor Lipid Panel     Component Value Date/Time   CHOL 178 12/16/2012 1035   TRIG 165.0 (H) 12/16/2012 1035   HDL 46.10 12/16/2012 1035   CHOLHDL 4 12/16/2012 1035   VLDL 33.0 12/16/2012 1035   LDLCALC  99 12/16/2012 1035   LDLDIRECT 120.4 12/12/2009 1016     HTN on Toprol and lisinopril   chronic CHF diastolic  - last echo  Recent Results (from the past [id number] hours)  ECHOCARDIOGRAM COMPLETE   Collection Time: 04/09/23  3:50 PM  Result Value   BP 93/39   S' Lateral 2.70   AR max vel 2.14   AV Peak grad 11.2   Ao pk vel 1.67   Area-P 1/2 3.77   MV M vel 3.79   MV Peak grad 57.5   Est EF 70 - 75%   Narrative      ECHOCARDIOGRAM REPORT      IMPRESSIONS    1. No significant change from echo done in NOv 2022.  2. Left ventricular ejection fraction, by estimation, is 70 to 75%. The left ventricle has hyperdynamic function. The left ventricle has no regional wall motion abnormalities. There is mild left ventricular hypertrophy. Left ventricular diastolic  parameters are consistent with Grade II diastolic dysfunction (pseudonormalization). Elevated left atrial pressure.  3. Right ventricular systolic function is mildly reduced. The right ventricular size is mildly enlarged.  4. Left atrial size was mildly dilated.  5. The mitral valve is normal in structure. Mild mitral valve regurgitation.  6. The aortic valve is normal in structure. Aortic valve regurgitation is not visualized.  7. The inferior vena cava is dilated in size with <  50% respiratory variability, suggesting right atrial pressure of 15 mmHg.           CAD  - On Aspirin, statin, betablocker,                 -  followed by cardiology                     DM 2 -  Lab Results  Component Value Date   HGBA1C 6.1 (H) 04/10/2023    diet controlled  History of PUD In August Required a total of 4 units of PRBC EGD showed erosive gastropathy/nonbleeding duodenal ulcer On PPI Eliquis held for 7 days and restarted on 8/27    Morbid obesity-   BMI Readings from Last 1 Encounters:  04/22/23 40.30 kg/m      COPD - not **followed by pulmonology   on baseline oxygen 2-3L,      A. Fib -   atrial fibrillation CHA2DS2 vas  score  7     current  on anticoagulation with  Eliquis,          -  Rate control:  Currently controlled with  Toprolol,        CKD stage IIIa baseline Cr 1.2 CrCl cannot be calculated (Unknown ideal weight.).  Lab Results  Component Value Date   CREATININE 1.50 (H) 09/22/2023   CREATININE 1.26 (H) 09/22/2023   CREATININE 1.25 (H) 04/22/2023   Lab Results  Component Value Date   NA 143 09/22/2023   CL 92 (L) 09/22/2023   K 3.7 09/22/2023   CO2 38 (H) 09/22/2023   BUN 29 (H) 09/22/2023   CREATININE 1.50 (H) 09/22/2023   GFRNONAA 42 (L) 09/22/2023   CALCIUM 9.6 09/22/2023   PHOS 2.5 08/19/2022   ALBUMIN 3.1 (L) 09/22/2023   GLUCOSE 102 (H) 09/22/2023     Chronic anemia - baseline hg Hemoglobin & Hematocrit  Recent Labs    04/22/23 0333 09/22/23 1040 09/22/23 1101  HGB 8.2* 7.4* 9.9*     While in ER:   Noted to have hemoglobin around 7.4 which is close to baseline    Lab Orders         CBC with Differential         Comprehensive metabolic panel         Brain natriuretic peptide         Vitamin B12         Folate         Iron and TIBC         Ferritin         Reticulocytes         I-stat chem 8, ED (not at [facility], [facility] or [facility])       CXR - Postop chest with enlarged heart and vascular congestion. Small effusions. Right greater than left.    Following Medications were ordered in ER: Medications  furosemide (LASIX) injection 40 mg (has no administration in time range)        ED Triage Vitals  Encounter Vitals Group     BP 09/22/23 1035 131/66     Systolic BP Percentile --      Diastolic BP Percentile --      Pulse Rate 09/22/23 1035 (!) 52     Resp 09/22/23 1035 17     Temp 09/22/23 1035 97.7 F (36.5 C)     Temp Source 09/22/23 1035 Oral     SpO2 09/22/23 1035 100 %  Weight --      Height --      Head Circumference --      Peak Flow --      Pain Score 09/22/23 1105 4     Pain Loc --      Pain Education --      Exclude from Growth Chart --    OZHY(86)@     _________________________________________ Significant initial  Findings: Abnormal Labs Reviewed  CBC WITH DIFFERENTIAL/PLATELET - Abnormal; Notable for the following components:      Result Value   WBC 3.9 (*)    RBC 3.17 (*)    Hemoglobin 7.4 (*)    HCT 28.7 (*)    MCH 23.3 (*)    MCHC 25.8 (*)    RDW 21.2 (*)    Platelets 102 (*)    Lymphs Abs 0.5 (*)    All other components within normal limits  COMPREHENSIVE METABOLIC PANEL - Abnormal; Notable for the following components:   Chloride 96 (*)    CO2 38 (*)    Glucose, Bld 100 (*)    Creatinine, Ser 1.26 (*)    Albumin 3.1 (*)    GFR, Estimated 42 (*)    All other components within normal limits  BRAIN NATRIURETIC PEPTIDE - Abnormal; Notable for the following components:   B Natriuretic Peptide 1,085.8 (*)    All other components within normal limits  I-STAT CHEM 8, ED - Abnormal; Notable for the following components:   Chloride 92 (*)    BUN 29 (*)    Creatinine, Ser 1.50 (*)    Glucose, Bld 102 (*)    TCO2 46 (*)    Hemoglobin 9.9 (*)    HCT 29.0 (*)    All other components within normal limits   ____________ Troponin  Cardiac Panel (last 3 results) Recent Labs    09/22/23 1725 09/22/23 1843  TROPONINIHS 17 17     ECG: Ordered Personally reviewed and interpreted by me showing: HR : sinus bradycardia Rhythm  RBBB   no evidence of ischemic changes QTC 446  BNP (last 3 results) Recent Labs    04/18/23 0855 04/19/23 0407 09/22/23 1725  BNP 1,613.0* 993.0* 1,085.8*     COVID-19 Labs  No results for input(s): "DDIMER", "FERRITIN", "LDH", "CRP" in the last 72 hours.  Lab Results  Component Value Date   SARSCOV2NAA NEGATIVE 04/09/2023   SARSCOV2NAA NEGATIVE 08/25/2022   SARSCOV2NAA NEGATIVE 08/10/2022   SARSCOV2NAA NEGATIVE 07/14/2021    The recent clinical data is shown below. Vitals:   09/22/23 1035 09/22/23 1639  BP: 131/66 (!) 143/63  Pulse: (!) 52 (!) 53  Resp: 17 18   Temp: 97.7 F (36.5 C) (!) 97.4 F (36.3 C)  TempSrc: Oral   SpO2: 100% 100%    WBC     Component Value Date/Time   WBC 3.9 (L) 09/22/2023 1040   LYMPHSABS 0.5 (L) 09/22/2023 1040   MONOABS 0.2 09/22/2023 1040   EOSABS 0.1 09/22/2023 1040   BASOSABS 0.1 09/22/2023 1040    Lactic Acid, Venous    Component Value Date/Time   LATICACIDVEN 0.5 04/09/2023 0425     UA  ordered     Results for orders placed or performed during the hospital encounter of 04/09/23  SARS Coronavirus 2 by RT PCR (hospital order, performed in [facility] hospital lab) *cepheid single result test* Anterior Nasal Swab     Status: None   Collection Time: 04/09/23  1:20 AM  Specimen: Anterior Nasal Swab  Result Value Ref Range Status   SARS Coronavirus 2 by RT PCR NEGATIVE NEGATIVE Final    Comment: Performed at [facility], [address], [place], [place] [address]     ________________________________________________________________ VBG ordered __________________________________________________________ Recent Labs  Lab 09/22/23 1040 09/22/23 1101  NA 144 143  K 3.7 3.7  CO2 38*  --   GLUCOSE 100* 102*  BUN 22 29*  CREATININE 1.26* 1.50*  CALCIUM 9.6  --     Cr    Up from baseline see below Lab Results  Component Value Date   CREATININE 1.50 (H) 09/22/2023   CREATININE 1.26 (H) 09/22/2023   CREATININE 1.25 (H) 04/22/2023    Recent Labs  Lab 09/22/23 1040  AST 28  ALT 14  ALKPHOS 102  BILITOT 0.6  PROT 6.5  ALBUMIN 3.1*   Lab Results  Component Value Date   CALCIUM 9.6 09/22/2023   PHOS 2.5 08/19/2022    Plt: Lab Results  Component Value Date   PLT 102 (L) 09/22/2023       Recent Labs  Lab 09/22/23 1040 09/22/23 1101  WBC 3.9*  --   NEUTROABS 3.0  --   HGB 7.4* 9.9*  HCT 28.7* 29.0*  MCV 90.5  --   PLT 102*  --     HG/HCT   Down   from baseline see below    Component Value Date/Time   HGB 9.9 (L) 09/22/2023 1101   HCT 29.0 (L) 09/22/2023 1101   MCV  90.5 09/22/2023 1040    _______________________________________________ Hospitalist was called for admission for     The following Work up has been ordered so far:  Orders Placed This Encounter  Procedures   DG Chest 2 View   CBC with Differential   Comprehensive metabolic panel   Brain natriuretic peptide   Vitamin B12   Folate   Iron and TIBC   Ferritin   Reticulocytes   Consult to hospitalist   I-stat chem 8, ED (not at [facility], [facility] or [facility])   ED EKG   EKG 12-Lead   ED EKG   EKG 12-Lead   Type and screen [facility]     OTHER Significant initial  Findings:  labs showing:     DM  labs:  HbA1C: Recent Labs    04/10/23 1153  HGBA1C 6.1*      CBG (last 3)  No results for input(s): "GLUCAP" in the last 72 hours.        Cultures: No results found for: "SDES", "SPECREQUEST", "CULT", "REPTSTATUS"   Radiological Exams on Admission: DG Chest 2 View Result Date: 09/22/2023 CLINICAL DATA:  Shortness of breath and weakness for 5 days EXAM: CHEST - 2 VIEW COMPARISON:  X-ray 04/17/2023 and older FINDINGS: Status post median sternotomy. Enlarged cardiopericardial silhouette calcified aorta. Vascular congestion. Elevation of the right hemidiaphragm. Small pleural effusions. Left midlung atelectasis. Film is under penetrated. Degenerative changes noted along the spine and shoulders. Fixation hardware seen along the lower cervical spine at the edge of the imaging field. IMPRESSION: Postop chest with enlarged heart and vascular congestion. Small effusions. Right greater than left. Under penetrated radiograph Electronically Signed   By: Karen Kays M.D.   On: 09/22/2023 17:08   _______________________________________________________________________________________________________ Latest  Blood pressure (!) 143/63, pulse (!) 53, temperature (!) 97.4 F (36.3 C), resp. rate 18, SpO2 100%.   Vitals  labs and radiology finding personally reviewed  Review of Systems:  Pertinent positives include:   fatigue  Constitutional:  No weight loss, night sweats, Fevers, chills,, weight loss  HEENT:  No headaches, Difficulty swallowing,Tooth/dental problems,Sore throat,  No sneezing, itching, ear ache, nasal congestion, post nasal drip,  Cardio-vascular:  No chest pain, Orthopnea, PND, anasarca, dizziness, palpitations.no Bilateral lower extremity swelling  GI:  No heartburn, indigestion, abdominal pain, nausea, vomiting, diarrhea, change in bowel habits, loss of appetite, melena, blood in stool, hematemesis Resp:  no shortness of breath at rest. No dyspnea on exertion, No excess mucus, no productive cough, No non-productive cough, No coughing up of blood.No change in color of mucus.No wheezing. Skin:  no rash or lesions. No jaundice GU:  no dysuria, change in color of urine, no urgency or frequency. No straining to urinate.  No flank pain.  Musculoskeletal:  No joint pain or no joint swelling. No decreased range of motion. No back pain.  Psych:  No change in mood or affect. No depression or anxiety. No memory loss.  Neuro: no localizing neurological complaints, no tingling, no weakness, no double vision, no gait abnormality, no slurred speech, no confusion  All systems reviewed and apart from HOPI all are negative _______________________________________________________________________________________________ Past Medical History:   Past Medical History:  Diagnosis Date   Allergic rhinitis    Anemia    Anxiety    Coronary atherosclerosis of unspecified type of vessel, native or graft    Diabetes mellitus    DJD (degenerative joint disease)    Hypercholesteremia    Hypertension    Low back pain    Morbid obesity (HCC)    Venous insufficiency    Vitamin D deficiency      Past Surgical History:  Procedure Laterality Date   ABDOMINAL HYSTERECTOMY     ANTERIOR CERVICAL DECOMP/DISCECTOMY FUSION N/A 08/14/2022   Procedure: Cerivcal Three-Four,  Cerivcal Four-Five, Cerivcal Five-Six Anterior Cervical Discectomy Fusion;  Surgeon: Barnett Abu, MD;  Location: [facility] OR;  Service: Neurosurgery;  Laterality: N/A;  RM 19   CORONARY ARTERY BYPASS GRAFT     3 vessel- Dr. Laneta Simmers 8/96   ESOPHAGOGASTRODUODENOSCOPY (EGD) WITH PROPOFOL Left 04/13/2023   Procedure: ESOPHAGOGASTRODUODENOSCOPY (EGD) WITH PROPOFOL;  Surgeon: Willis Modena, MD;  Location: [facility] ENDOSCOPY;  Service: Gastroenterology;  Laterality: Left;    Social History:  Ambulatory  walker      reports that she has never smoked. She has never used smokeless tobacco. She reports that she does not drink alcohol and does not use drugs.   Family History:   Family History  Problem Relation Age of Onset   Heart disease Mother    Heart disease Brother    ______________________________________________________________________________________________ Allergies: Allergies  Allergen Reactions   Cardizem [Diltiazem] Rash   Glucophage [Metformin] Other (See Comments)    "it made me sad"     Prior to Admission medications   Medication Sig Start Date End Date Taking? Authorizing Provider  acetaminophen (TYLENOL) 325 MG tablet Take 650 mg by mouth 2 (two) times daily as needed for moderate pain, fever or headache.   Yes [provider]  ammonium lactate (LAC-HYDRIN) 12 % lotion Apply 1 Application topically daily as needed for dry skin.   Yes [provider]  apixaban (ELIQUIS) 5 MG TABS tablet Take 1 tablet (5 mg total) by mouth 2 (two) times daily. 11/23/22  Yes Dyann Kief, PA-C  Cholecalciferol (VITAMIN D-3 PO) Take 1 tablet by mouth daily.   Yes [provider]  clonazePAM (KLONOPIN) 0.5 MG tablet Take 0.5  mg by mouth 2 (two) times daily. 09/22/23  Yes [provider]  dapagliflozin propanediol (FARXIGA) 10 MG TABS tablet Take 1 tablet (10 mg total) by mouth daily before breakfast. 02/02/22  Yes Lyn Records, MD  diclofenac sodium (VOLTAREN) 1 %  GEL Apply 2 g topically daily as needed (pain). 08/27/22  Yes Rai, Ripudeep K, MD  docusate sodium (COLACE) 100 MG capsule Take 1 capsule (100 mg total) by mouth 2 (two) times daily. Patient taking differently: Take 100 mg by mouth daily as needed (constipation). 08/27/22  Yes Rai, Ripudeep K, MD  furosemide (LASIX) 20 MG tablet Take 1 tablet (20 mg total) by mouth daily. 07/18/21 09/22/23 Yes Meredeth Ide, MD  gabapentin (NEURONTIN) 100 MG capsule Take 1 capsule (100 mg total) by mouth at bedtime. Patient taking differently: Take 100 mg by mouth daily. 03/17/18  Yes Gearldine Bienenstock, PA-C  lidocaine 4 % Place 1 patch onto the skin daily as needed (pain). 09/25/22  Yes [provider]  lisinopril (ZESTRIL) 20 MG tablet Take 1 tablet (20 mg total) by mouth daily. 07/18/21 09/22/23 Yes Meredeth Ide, MD  metoprolol succinate (TOPROL-XL) 100 MG 24 hr tablet Take 1 tablet (100 mg total) by mouth daily. 10/27/22  Yes Dyann Kief, PA-C  Multiple Vitamins-Minerals (MULTIVITAMIN WOMEN 50+) TABS Take 1 tablet by mouth daily.   Yes [provider]  pantoprazole (PROTONIX) 40 MG tablet Take 1 tablet (40 mg total) by mouth daily. 08/27/22  Yes Rai, Ripudeep K, MD  polyethylene glycol (MIRALAX / GLYCOLAX) 17 g packet Take 17 g by mouth daily as needed (constipation). 09/25/22  Yes [provider]  rosuvastatin (CRESTOR) 20 MG tablet Take 1 tablet (20 mg total) by mouth daily. 01/12/23  Yes Jake Bathe, MD  VITAMIN E PO Take 1 capsule by mouth daily.   Yes [provider]    ___________________________________________________________________________________________________ Physical Exam:    09/22/2023    4:39 PM 09/22/2023   10:35 AM 04/22/2023    8:32 AM  Vitals with BMI  Systolic 143 131 161  Diastolic 63 66 73  Pulse 53 52     1. General:  in No  Acute distress    Chronically ill   -appearing 2. Psychological: Alert and   Oriented 3. Head/ENT:   Moist   Mucous Membranes                           Head Non traumatic, neck supple                           Poor Dentition 4. SKIN:  decreased Skin turgor,  Skin clean Dry and intact no rash    5. Heart: Regular rate and rhythm systolic Murmur, no Rub or gallop 6. Lungs:  no wheezes or crackles   7. Abdomen: Soft,  non-tender, Non distended   obese  bowel sounds present 8. Lower extremities: no clubbing, cyanosis, 2+ edema 9. Neurologically Grossly intact, moving all 4 extremities equally   10. MSK: Normal range of motion    Chart has been reviewed  ______________________________________________________________________________________________  Assessment/Plan  89 y.o. female with medical history significant of HTN, HLD, DM-2, PAF on Eliquis, CAD s/p CABG 1996, chronic HFpEF, PUD,  Sinus bradycardia, Pancytopenia, CKD 3a  Admitted for acute on chronic diastolic CHF, symptomatic anemia, symptomatic bradycardia   Present on Admission:  Acute on chronic  diastolic CHF (congestive heart failure) (HCC)  Sinus bradycardia  Pancytopenia (HCC)  Paroxysmal atrial fibrillation (HCC)  Essential hypertension  HYPERCHOLESTEROLEMIA  CKD (chronic kidney disease) stage 3, GFR 30-59 ml/min (HCC)  DM (diabetes mellitus) type II controlled with renal manifestation (HCC)  Acute respiratory failure with hypoxia and hypercapnia (HCC)  Hypoglycemia  Symptomatic anemia  Sinus bradycardia Hold Toprol check TSH Attempt to obtain rhythm strip will need cardiology assessment and consult Repeat echo  Pancytopenia (HCC) Somewhat chronic will need follow-up as an outpatient hemoglobin below 8 with history of CAD will transfuse as needed  Paroxysmal atrial fibrillation (HCC) Check Hemoccult stool given somewhat worsening anemia May need to hold Eliquis and repeat endoscopy Patient has been seen in the past by Dr. Dulce Sellar  Acute on chronic diastolic CHF (congestive heart failure) (HCC) - Pt diagnosed with CHF based on presence of  the following:  PND, OA, rales on exam, JVD, cardiomegaly, Pulmonary edema on CXR, and   bilateral leg edema,  , pleural effusion  With noted response to IV diuretic in ER  admit on telemetry,  cycle cardiac enzymes, Cardiac Panel (last 3 results) Recent Labs    09/22/23 1725 09/22/23 1843  TROPONINIHS 17 17     obtain serial ECG  to evaluate for ischemia as a cause of heart failure  monitor daily weight:   Last BNP BNP (last 3 results) Recent Labs    04/18/23 0855 04/19/23 0407 09/22/23 1725  BNP 1,613.0* 993.0* 1,085.8*      diurese with IV lasix   40 mg IV  Daily and monitor orthostatics and creatinine to avoid over diuresis.  Order echogram to evaluate EF and valves  ACE/ARBi Contraindicated    cardiology consulted    Essential hypertension Allow permissive hypertension for tonight  History of coronary artery disease s/p CABG  Hold aspirin given worsening anemia.  Transfuse for hemoglobin below 8  HYPERCHOLESTEROLEMIA Continue Crestor 20 mg a day  CKD (chronic kidney disease) stage 3, GFR 30-59 ml/min (HCC)  -chronic avoid nephrotoxic medications such as NSAIDs, Vanco Zosyn combo,  avoid hypotension, continue to follow renal function   DM (diabetes mellitus) type II controlled with renal manifestation (HCC)  - Order Sensitive  SSI       Acute respiratory failure with hypoxia and hypercapnia (HCC)  this patient has acute respiratory failure with Hypoxia and  Hypercarbia as documented by the presence of following: O2 saturatio< 90% on RA   pCO2 >50   Likely due to: Diastolic chf Provide O2 therapy and titrate as needed  Continuous pulse ox  Order BIPAP  flutter valve ordered   Hypoglycemia DC sliding scale check CBG every 2 hours correct as needed  Symptomatic anemia Repeat CBC showing improved hemoglobin.  Will continue to monitor transfuse as needed for hemoglobin below 8 order anemia panel may need iron infusion prior to discharge Sent message to  Dr. Dulce Sellar as patient may need further studies  Other plan as per orders.  DVT prophylaxis:  SCD     Code Status:    Code Status: Prior FULL CODE   as per patient   I had personally discussed CODE STATUS with patient and family  ACP   none   Family Communication:   Family   at  Bedside  plan of care was discussed   with  Daughter,  Diet npo post midnight   Disposition Plan:       To home once workup is complete and patient is stable  Following barriers for discharge:                                                       Electrolytes corrected                               Anemia corrected h/H stable                                                         Will need to be able to tolerate PO                                                        Will need consultants to evaluate patient prior to discharge       Consult Orders  (From admission, onward)           Start     Ordered   09/22/23 1957  Consult to hospitalist  Pg by Viviann Spare  Once       Provider:  (Not yet assigned)  Question Answer Comment  Place call to: [facility]   Reason for Consult Admit      09/22/23 1956                             Would benefit from PT/OT eval prior to DC  Ordered                      Nutrition    consulted                  Wound care  consulted                     Consults called: email Cardiology , Enid Baas Admission status:  ED Disposition     ED Disposition  Admit   Condition  --   Comment  [facility] [[id number]]  Level of Care: Progressive [102]  Admit to Progressive based on following criteria: CARDIOVASCULAR & THORACIC of moderate stability with acute coronary syndrome symptoms/low risk myocardial infarction/hypertensive urgency/arrhythmias/heart failure potentially compromising stability and stable post cardiovascular intervention patients.  May admit patient to [facility] or [facility] if equivalent level of care is available::  No  Covid Evaluation: Asymptomatic - no recent exposure (last 10 days) testing not required  Diagnosis: Acute on chronic diastolic CHF (congestive heart failure) [facility]) [[id number]]  Admitting Physician: Therisa Doyne [[id number]]  Attending Physician: Therisa Doyne [[id number]]  Certification:: I certify this patient will need inpatient services for at least 2 midnights  Expected Medical Readiness: 09/24/2023             inpatient     I Expect 2 midnight stay secondary to severity of patient's current illness need for inpatient interventions justified by the following:  hemodynamic instability despite optimal treatment (bradycardia  hypoxia,  )  Severe lab/radiological/exam abnormalities including:    Symptomatic anemia and extensive comorbidities including:  DM2   CHF  CAD  COPD/asthma   CKD  Chronic anticoagulation  That are currently affecting medical management.   I expect  patient to be hospitalized for 2 midnights requiring inpatient medical care.  Patient is at high risk for adverse outcome (such as loss of life or disability) if not treated.  Indication for inpatient stay as follows:   Hemodynamic instability despite maximal medical therapy,      Need for operative/procedural  intervention New or worsening hypoxia   Need for IV diuretics and frequent labs    Level of care     progressive     tele indefinitely please discontinue once patient no longer qualifies COVID-19 Labs     Selmer Adduci 09/22/2023, 9:54 PM    [facility]     after 2 AM please page floor coverage PA If 7AM-7PM, please contact the day team taking care of the patient using [email or web address]

## 2023-09-22 NOTE — Assessment & Plan Note (Addendum)
Repeat CBC showing improved hemoglobin.  Will continue to monitor transfuse as needed for hemoglobin below 8 order anemia panel may need iron infusion prior to discharge Sent message to Dr. Dulce Sellar as patient may need further studies

## 2023-09-23 ENCOUNTER — Other Ambulatory Visit: Payer: Self-pay

## 2023-09-23 ENCOUNTER — Encounter (HOSPITAL_COMMUNITY): Payer: Self-pay | Admitting: Internal Medicine

## 2023-09-23 ENCOUNTER — Inpatient Hospital Stay (HOSPITAL_COMMUNITY): Payer: Medicare PPO

## 2023-09-23 DIAGNOSIS — I4819 Other persistent atrial fibrillation: Secondary | ICD-10-CM | POA: Diagnosis not present

## 2023-09-23 DIAGNOSIS — I5031 Acute diastolic (congestive) heart failure: Secondary | ICD-10-CM | POA: Diagnosis not present

## 2023-09-23 DIAGNOSIS — D509 Iron deficiency anemia, unspecified: Secondary | ICD-10-CM

## 2023-09-23 DIAGNOSIS — I5032 Chronic diastolic (congestive) heart failure: Secondary | ICD-10-CM | POA: Diagnosis not present

## 2023-09-23 DIAGNOSIS — I5033 Acute on chronic diastolic (congestive) heart failure: Secondary | ICD-10-CM | POA: Diagnosis not present

## 2023-09-23 LAB — GLUCOSE, CAPILLARY
Glucose-Capillary: 80 mg/dL (ref 70–99)
Glucose-Capillary: 116 mg/dL — ABNORMAL HIGH (ref 70–99)
Glucose-Capillary: 127 mg/dL — ABNORMAL HIGH (ref 70–99)

## 2023-09-23 LAB — URINALYSIS, COMPLETE (UACMP) WITH MICROSCOPIC
Bacteria, UA: NONE SEEN
Bilirubin Urine: NEGATIVE
Glucose, UA: >=500 mg/dL — AB
Hgb urine dipstick: NEGATIVE
Ketones, ur: NEGATIVE mg/dL
Leukocytes,Ua: NEGATIVE
Nitrite: NEGATIVE
Protein, ur: NEGATIVE mg/dL
Specific Gravity, Urine: 1.002 — ABNORMAL LOW (ref 1.005–1.030)
pH: 8 (ref 5.0–8.0)

## 2023-09-23 LAB — COMPREHENSIVE METABOLIC PANEL
ALT: 13 U/L (ref 0–44)
AST: 21 U/L (ref 15–41)
Albumin: 2.9 g/dL — ABNORMAL LOW (ref 3.5–5.0)
Alkaline Phosphatase: 98 U/L (ref 38–126)
Anion gap: 8 (ref 5–15)
BUN: 20 mg/dL (ref 8–23)
CO2: 44 mmol/L — ABNORMAL HIGH (ref 22–32)
Calcium: 9.5 mg/dL (ref 8.9–10.3)
Chloride: 96 mmol/L — ABNORMAL LOW (ref 98–111)
Creatinine, Ser: 1.03 mg/dL — ABNORMAL HIGH (ref 0.44–1.00)
GFR, Estimated: 53 mL/min — ABNORMAL LOW (ref >60)
Glucose, Bld: 85 mg/dL (ref 70–99)
Potassium: 3.6 mmol/L (ref 3.5–5.1)
Sodium: 148 mmol/L — ABNORMAL HIGH (ref 135–145)
Total Bilirubin: 0.5 mg/dL (ref 0.0–1.2)
Total Protein: 6.3 g/dL — ABNORMAL LOW (ref 6.5–8.1)

## 2023-09-23 LAB — CREATININE, URINE, RANDOM: Creatinine, Urine: <10 mg/dL

## 2023-09-23 LAB — CBC
HCT: 26.1 % — ABNORMAL LOW (ref 36.0–46.0)
Hemoglobin: 6.8 g/dL — CL (ref 12.0–15.0)
MCH: 23.6 pg — ABNORMAL LOW (ref 26.0–34.0)
MCHC: 26.1 g/dL — ABNORMAL LOW (ref 30.0–36.0)
MCV: 90.6 fL (ref 80.0–100.0)
Platelets: 95 10*3/uL — ABNORMAL LOW (ref 150–400)
RBC: 2.88 MIL/uL — ABNORMAL LOW (ref 3.87–5.11)
RDW: 21.2 % — ABNORMAL HIGH (ref 11.5–15.5)
WBC: 3.2 10*3/uL — ABNORMAL LOW (ref 4.0–10.5)
nRBC: 0 % (ref 0.0–0.2)

## 2023-09-23 LAB — SODIUM, URINE, RANDOM: Sodium, Ur: 108 mmol/L

## 2023-09-23 LAB — ECHOCARDIOGRAM COMPLETE
AR max vel: 1.94 cm2
AV Area VTI: 2.18 cm2
AV Area mean vel: 2.13 cm2
AV Mean grad: 5 mm[Hg]
AV Peak grad: 8.8 mm[Hg]
Ao pk vel: 1.48 m/s
Area-P 1/2: 3.46 cm2
MV M vel: 5.53 m/s
MV Peak grad: 122.3 mm[Hg]
Radius: 1 cm
S' Lateral: 3 cm

## 2023-09-23 LAB — CBG MONITORING, ED
Glucose-Capillary: 129 mg/dL — ABNORMAL HIGH (ref 70–99)
Glucose-Capillary: 135 mg/dL — ABNORMAL HIGH (ref 70–99)
Glucose-Capillary: 84 mg/dL (ref 70–99)
Glucose-Capillary: 87 mg/dL (ref 70–99)
Glucose-Capillary: 91 mg/dL (ref 70–99)

## 2023-09-23 LAB — PREPARE RBC (CROSSMATCH)

## 2023-09-23 LAB — HEMOGLOBIN AND HEMATOCRIT, BLOOD
HCT: 28 % — ABNORMAL LOW (ref 36.0–46.0)
Hemoglobin: 7.8 g/dL — ABNORMAL LOW (ref 12.0–15.0)

## 2023-09-23 LAB — PHOSPHORUS: Phosphorus: 3.1 mg/dL (ref 2.5–4.6)

## 2023-09-23 LAB — MAGNESIUM: Magnesium: 2.4 mg/dL (ref 1.7–2.4)

## 2023-09-23 LAB — OSMOLALITY, URINE: Osmolality, Ur: 285 mosm/kg — ABNORMAL LOW (ref 300–900)

## 2023-09-23 LAB — MRSA NEXT GEN BY PCR, NASAL: MRSA by PCR Next Gen: NOT DETECTED

## 2023-09-23 MED ORDER — LOSARTAN POTASSIUM 25 MG PO TABS
25.0000 mg | ORAL_TABLET | Freq: Every day | ORAL | Status: DC
  Administered 2023-09-23 – 2023-09-29 (×6): 25 mg via ORAL
  Filled 2023-09-23 (×6): qty 1

## 2023-09-23 MED ORDER — SODIUM CHLORIDE 0.9% IV SOLUTION: Freq: Once | INTRAVENOUS | Status: AC

## 2023-09-23 MED ORDER — INSULIN ASPART 100 UNIT/ML IJ SOLN
0.0000 [IU] | Freq: Three times a day (TID) | INTRAMUSCULAR | Status: DC
  Administered 2023-09-24 – 2023-09-28 (×4): 2 [IU] via SUBCUTANEOUS

## 2023-09-23 MED ORDER — DOCUSATE SODIUM 100 MG PO CAPS
100.0000 mg | ORAL_CAPSULE | Freq: Every day | ORAL | Status: DC | PRN
  Administered 2023-09-28: 100 mg via ORAL
  Filled 2023-09-23: qty 1

## 2023-09-23 MED ORDER — FUROSEMIDE 10 MG/ML IJ SOLN
40.0000 mg | Freq: Two times a day (BID) | INTRAMUSCULAR | Status: DC
  Administered 2023-09-23 – 2023-09-27 (×7): 40 mg via INTRAVENOUS
  Filled 2023-09-23 (×7): qty 4

## 2023-09-23 MED ORDER — ACETAMINOPHEN 650 MG RE SUPP: 650.0000 mg | Freq: Four times a day (QID) | RECTAL | Status: DC | PRN

## 2023-09-23 MED ORDER — ONDANSETRON HCL 4 MG/2ML IJ SOLN: 4.0000 mg | Freq: Four times a day (QID) | INTRAMUSCULAR | Status: DC | PRN

## 2023-09-23 MED ORDER — ACETAMINOPHEN 325 MG PO TABS
650.0000 mg | ORAL_TABLET | Freq: Four times a day (QID) | ORAL | Status: DC | PRN
  Administered 2023-09-24 – 2023-09-29 (×6): 650 mg via ORAL
  Filled 2023-09-23 (×7): qty 2

## 2023-09-23 MED ORDER — FUROSEMIDE 10 MG/ML IJ SOLN
40.0000 mg | Freq: Every day | INTRAMUSCULAR | Status: DC
  Administered 2023-09-23: 40 mg via INTRAVENOUS
  Filled 2023-09-23: qty 4

## 2023-09-23 MED ORDER — PANTOPRAZOLE SODIUM 40 MG IV SOLR
40.0000 mg | Freq: Two times a day (BID) | INTRAVENOUS | Status: DC
  Administered 2023-09-23 – 2023-09-24 (×4): 40 mg via INTRAVENOUS
  Filled 2023-09-23 (×4): qty 10

## 2023-09-23 MED ORDER — MEDIHONEY WOUND/BURN DRESSING EX PSTE
1.0000 | PASTE | Freq: Every day | CUTANEOUS | Status: DC
  Administered 2023-09-24 – 2023-09-29 (×5): 1 via TOPICAL
  Filled 2023-09-23: qty 44

## 2023-09-23 MED ORDER — ROSUVASTATIN CALCIUM 20 MG PO TABS
20.0000 mg | ORAL_TABLET | Freq: Every day | ORAL | Status: DC
  Administered 2023-09-23 – 2023-09-29 (×6): 20 mg via ORAL
  Filled 2023-09-23 (×6): qty 1

## 2023-09-23 MED ORDER — ZINC OXIDE 40 % EX OINT
TOPICAL_OINTMENT | Freq: Two times a day (BID) | CUTANEOUS | Status: DC
  Filled 2023-09-23: qty 57

## 2023-09-23 MED ORDER — METOPROLOL SUCCINATE ER 25 MG PO TB24
25.0000 mg | ORAL_TABLET | Freq: Every day | ORAL | Status: DC
  Administered 2023-09-24 – 2023-09-29 (×5): 25 mg via ORAL
  Filled 2023-09-23 (×5): qty 1

## 2023-09-23 MED ORDER — ONDANSETRON HCL 4 MG PO TABS: 4.0000 mg | ORAL_TABLET | Freq: Four times a day (QID) | ORAL | Status: DC | PRN

## 2023-09-23 MED ORDER — CLONAZEPAM 0.5 MG PO TABS
0.5000 mg | ORAL_TABLET | Freq: Two times a day (BID) | ORAL | Status: DC
  Administered 2023-09-23 – 2023-09-27 (×6): 0.5 mg via ORAL
  Filled 2023-09-23 (×8): qty 1

## 2023-09-23 NOTE — Consult Note (Signed)
WOC Nurse Consult Note: this patient is familiar to WOC team from previous admissions with longstanding Stage 3 Pressure Injury sacrum  Reason for Consult: sacral wound  Wound type: Stage 3 Pressure Injury sacrum  Pressure Injury POA: Yes Measurement: 2 cm x 1 cm x 0.1 cm  Wound bed: 50% yellow 50% pink moist  Drainage (amount, consistency, odor) minimal tan  Periwound: mild moisture associated skin damage (incontinent with purewick and pull up in place at time of visit)  Dressing procedure/placement/frequency: Clean sacral wound with Vashe wound cleanser [employer] [place] [phone]), apply Medihoney to wound bed daily, cover with dry gauze and silicone foam.  Apply a thin layer of Zinc to surrounding skin of sacrum and buttocks 2 times daily and prn soiling.   POC discussed with bedside nurse. WOC team will not follow. Re-consult if further needs arise.   Thank you,    Priscella Mann MSN, RN-BC, [employer] [phone]

## 2023-09-23 NOTE — Progress Notes (Signed)
PT Cancellation Note  Patient Details Name: Erika Gates MRN: [id number] DOB: 11/06/1934   Cancelled Treatment:    Reason Eval/Treat Not Completed: Medical issues which prohibited therapy;Patient at procedure or test/unavailable (RN asked PT to hold this morning given pt's HgB 6.8 that has yet to be transfused d/t Echo and examinations.) Will check back in pm as able.   Cheri Guppy, PT, DPT Acute Rehabilitation Services Office: [phone] Secure Chat Preferred  [name] 09/23/2023, 9:25 AM

## 2023-09-23 NOTE — ED Notes (Signed)
Had security page attending.

## 2023-09-23 NOTE — Consult Note (Addendum)
Cardiology Consultation   Patient ID: [name] MRN: [id number]; DOB: 01-01-1935  Admit date: 09/22/2023 Date of Consult: 09/23/2023  PCP:  Erika Northern, MD   [facility] Providers Cardiologist:  Lesleigh Noe, MD (Inactive) Dr Anne Fu     Patient Profile:   Erika Gates is a 89 y.o. female with a hx of CAD with CABG in 1996, chronic diastolic heart failure, hypertension, HLD, type 2 DM, morbid obesity, persistent atrial fibrillation following post-op PEA arrest 07/2022, sinus bradycardia, pancytopenia, who is being seen 09/23/2023 for the evaluation of CHF at the request of Dr Erika Gates.  History of Present Illness:   Ms. Erika Gates with above PMH presented to ER 09/22/23 for weakness and fatigue. Patient is not a ideal historian, her daughter is at bedside, assisted history telling, who is also primarily taking care of the patient. She states patient has chronic SOB with exertion, gets winded with moving around in the house, requires assistance with ambulation. She felt her breathing maybe slightly more labored with activity over the past 5 days, although did not feel it was too bad yesterday. She had given additional lasix over the weekend because patient had more leg swelling. She states patient does not consume high salt diet, drinks average 33 oz fluid daily. Patient states she feels well, no complaints, denied any chest pain, SOB at rest, dizziness, syncope. Daughter states PCP noted she was severely anemic 7.4 and told them to go to ER yesterday. She has dark stool at baseline due to iron supplement, denied any hematemesis or BRBPR. She takes Eliquis for her A fib, and is not normally aware of her A fib.   Admission diagnostic from 09/22/23 showed Cr 1.5, BUN 29, GFR 42. WBC 3900, Hgb 7.4, PLT 102k at PCP office, Hgb repeat was 9.9 at ER. BNP 1085. Hs trop 17>17. CXR showed vascular congestion.  Repeat labs today showed Cr 1.03, GFR 53, albumin 2.9. Mag 2.4. Iron panel  suggestive of iron deficiency. Folate and B12 normal. Procal <0.1. WBC 3200, Hgb 6.8, PLT 95k. TSH WNL UA +glucose and SG 1.002. She was hypoxic at ER, required BIPAP support, normally on 3LNC at home. She was admitted to hospitalist, transfusion given today due to worsening of chronic anemia. Cardiology is consulted for acute diastolic heart failure today. She was given IV lasix 40mg  BID, PTA torpolol XL held due to bradycardia and Eliquis held due to worsening anemia with pending FOBT.    Per chart review, she remotely followed Dr Katrinka Blazing, has CAD and underwent CABG x3 in 1996. She is maintained on lisinopril, Toprol XL and crestor for medical therapy. He has chronic diastolic heart failure. Echo from 07/14/2021 showed LVEF 60-65%, no RWMA, mild LVH, grade I DD, mild reduced RV, mild elevated PASP 36.9 mmHg, trivial MR. She is historically on lasix 20mg  daily as well as GDMT including lisinopril, metoprolol XL, farxiga.   She was hospitalized for myelopathy and quadriparesis 07/2022, underwent cervical decompression. Course was complicated by PEA arrest 2 days after surgery, felt due to polypharmacy. She developed A fib RVR on 08/18/22, saw by cardiology, placed on higher dose Toprol XL 100mg  for rate control and Eliquis for anticoagulation. At some point, she was noted bradycardiac in 40s-50s on metoprolol XL 150mg  daily, this was reduced to 100mg  daily.    She was last seen by Dr Anne Fu 01/12/23,doing well without angina or CHF symptoms. Most recent Echo from today showed LVEF 60-65%, no RWMA, mild LV dilation,  indeterminate diastolic parameter, moderately reduced RV, severe RV enlargement, mild RAE, moderate MR, aortic sclerosis.       Past Medical History:  Diagnosis Date   Allergic rhinitis    Anemia    Anxiety    Coronary atherosclerosis of unspecified type of vessel, native or graft    Diabetes mellitus    DJD (degenerative joint disease)    Hypercholesteremia    Hypertension    Low back  pain    Morbid obesity (HCC)    Venous insufficiency    Vitamin D deficiency     Past Surgical History:  Procedure Laterality Date   ABDOMINAL HYSTERECTOMY     ANTERIOR CERVICAL DECOMP/DISCECTOMY FUSION N/A 08/14/2022   Procedure: Cerivcal Three-Four, Cerivcal Four-Five, Cerivcal Five-Six Anterior Cervical Discectomy Fusion;  Surgeon: Barnett Abu, MD;  Location: [facility] OR;  Service: Neurosurgery;  Laterality: N/A;  RM 19   CORONARY ARTERY BYPASS GRAFT     3 vessel- Dr. Laneta Simmers 8/96   ESOPHAGOGASTRODUODENOSCOPY (EGD) WITH PROPOFOL Left 04/13/2023   Procedure: ESOPHAGOGASTRODUODENOSCOPY (EGD) WITH PROPOFOL;  Surgeon: Willis Modena, MD;  Location: [facility] ENDOSCOPY;  Service: Gastroenterology;  Laterality: Left;     Home Medications:  Prior to Admission medications   Medication Sig Start Date End Date Taking? Authorizing Provider  acetaminophen (TYLENOL) 325 MG tablet Take 650 mg by mouth 2 (two) times daily as needed for moderate pain, fever or headache.   Yes [provider]  ammonium lactate (LAC-HYDRIN) 12 % lotion Apply 1 Application topically daily as needed for dry skin.   Yes [provider]  apixaban (ELIQUIS) 5 MG TABS tablet Take 1 tablet (5 mg total) by mouth 2 (two) times daily. 11/23/22  Yes Dyann Kief, PA-C  Cholecalciferol (VITAMIN D-3 PO) Take 1 tablet by mouth daily.   Yes [provider]  clonazePAM (KLONOPIN) 0.5 MG tablet Take 0.5 mg by mouth 2 (two) times daily. 09/22/23  Yes [provider]  dapagliflozin propanediol (FARXIGA) 10 MG TABS tablet Take 1 tablet (10 mg total) by mouth daily before breakfast. 02/02/22  Yes Lyn Records, MD  diclofenac sodium (VOLTAREN) 1 % GEL Apply 2 g topically daily as needed (pain). 08/27/22  Yes Rai, Ripudeep K, MD  docusate sodium (COLACE) 100 MG capsule Take 1 capsule (100 mg total) by mouth 2 (two) times daily. Patient taking differently: Take 100 mg by mouth daily as needed (constipation). 08/27/22  Yes  Rai, Ripudeep K, MD  furosemide (LASIX) 20 MG tablet Take 1 tablet (20 mg total) by mouth daily. 07/18/21 09/22/23 Yes Meredeth Ide, MD  gabapentin (NEURONTIN) 100 MG capsule Take 1 capsule (100 mg total) by mouth at bedtime. Patient taking differently: Take 100 mg by mouth daily. 03/17/18  Yes Gearldine Bienenstock, PA-C  lidocaine 4 % Place 1 patch onto the skin daily as needed (pain). 09/25/22  Yes [provider]  lisinopril (ZESTRIL) 20 MG tablet Take 1 tablet (20 mg total) by mouth daily. 07/18/21 09/22/23 Yes Meredeth Ide, MD  metoprolol succinate (TOPROL-XL) 100 MG 24 hr tablet Take 1 tablet (100 mg total) by mouth daily. 10/27/22  Yes Dyann Kief, PA-C  Multiple Vitamins-Minerals (MULTIVITAMIN WOMEN 50+) TABS Take 1 tablet by mouth daily.   Yes [provider]  pantoprazole (PROTONIX) 40 MG tablet Take 1 tablet (40 mg total) by mouth daily. 08/27/22  Yes Rai, Ripudeep K, MD  polyethylene glycol (MIRALAX / GLYCOLAX) 17 g packet Take 17 g by mouth daily as  needed (constipation). 09/25/22  Yes [provider]  rosuvastatin (CRESTOR) 20 MG tablet Take 1 tablet (20 mg total) by mouth daily. 01/12/23  Yes Jake Bathe, MD  VITAMIN E PO Take 1 capsule by mouth daily.   Yes [provider]    Inpatient Medications: Scheduled Meds:  sodium chloride   Intravenous Once   clonazePAM  0.5 mg Oral BID   furosemide  40 mg Intravenous BID   leptospermum manuka honey  1 Application Topical Daily   liver oil-zinc oxide   Topical BID   losartan  25 mg Oral Daily   metoprolol succinate  25 mg Oral Daily   pantoprazole (PROTONIX) IV  40 mg Intravenous Q12H   rosuvastatin  20 mg Oral Daily   Continuous Infusions:  PRN Meds: acetaminophen **OR** acetaminophen, docusate sodium, ondansetron **OR** ondansetron (ZOFRAN) IV  Allergies:    Allergies  Allergen Reactions   Cardizem [Diltiazem] Rash   Glucophage [Metformin] Other (See Comments)    "it made me sad"    Social  History:   Social History   Socioeconomic History   Marital status: Widowed    Spouse name: Not on file   Number of children: Not on file   Years of education: Not on file   Highest education level: Not on file  Occupational History   Occupation: retired  Tobacco Use   Smoking status: Never   Smokeless tobacco: Never  Vaping Use   Vaping status: Never Used  Substance and Sexual Activity   Alcohol use: No   Drug use: Never   Sexual activity: Not on file  Other Topics Concern   Not on file  Social History Narrative   Not on file   Social Drivers of Health   Financial Resource Strain: Low Risk  (08/27/2022)   Received from [facility], [facility]   Overall Financial Resource Strain (CARDIA)    Difficulty of Paying Living Expenses: Not hard at all  Food Insecurity: No Food Insecurity (09/23/2023)   Hunger Vital Sign    Worried About Running Out of Food in the Last Year: Never true    Ran Out of Food in the Last Year: Never true  Transportation Needs: No Transportation Needs (09/23/2023)   PRAPARE - Administrator, Civil Service (Medical): No    Lack of Transportation (Non-Medical): No  Physical Activity: Not on file  Stress: Not on file  Social Connections: Moderately Isolated (09/23/2023)   Social Connection and Isolation Panel [NHANES]    Frequency of Communication with Friends and Family: Once a week    Frequency of Social Gatherings with Friends and Family: More than three times a week    Attends Religious Services: 1 to 4 times per year    Active Member of [employer] or Organizations: No    Attends Banker Meetings: Never    Marital Status: Widowed  Intimate Partner Violence: Not At Risk (09/23/2023)   Humiliation, Afraid, Rape, and Kick questionnaire    Fear of Current or Ex-Partner: No    Emotionally Abused: No    Physically Abused: No    Sexually Abused: No    Family History:    Family History  Problem  Relation Age of Onset   Heart disease Mother    Heart disease Brother      ROS:  Constitutional: Denied fever, chills, malaise, night sweats Eyes: Denied vision change or loss Ears/Nose/Mouth/Throat: Denied ear ache, sore throat, coughing, sinus  pain Cardiovascular: see HPI Respiratory: see HPI  Gastrointestinal: Denied nausea, vomiting, abdominal pain, diarrhea Genital/Urinary: Denied dysuria, hematuria, urinary frequency/urgency Musculoskeletal: Denied muscle ache, joint pain, weakness Skin: Denied rash, wound Neuro: Denied headache, dizziness, syncope Psych: Denied history of depression/anxiety  Endocrine: history of diabetes     Physical Exam/Data:   Vitals:   09/23/23 0915 09/23/23 1043 09/23/23 1122 09/23/23 1259  BP: (!) 129/50 117/65  (!) 121/50  Pulse: 62 64  63  Resp: 16 19  16   Temp:  97.6 F (36.4 C)  97.7 F (36.5 C)  TempSrc:  Oral  Oral  SpO2: 100% 100%  100%  Height:   5' (1.524 m)     Intake/Output Summary (Last 24 hours) at 09/23/2023 1311 Last data filed at 09/23/2023 0918 Gross per 24 hour  Intake --  Output 2650 ml  Net -2650 ml      04/22/2023    4:21 AM 04/21/2023    4:56 AM 04/20/2023    5:00 AM  Last 3 Weights  Weight (lbs) 206 lb 5.6 oz 206 lb 2.1 oz 210 lb 15.7 oz  Weight (kg) 93.6 kg 93.5 kg 95.7 kg     Body mass index is 40.3 kg/m.   Vitals:  Vitals:   09/23/23 1043 09/23/23 1259  BP: 117/65 (!) 121/50  Pulse: 64 63  Resp: 19 16  Temp: 97.6 F (36.4 C) 97.7 F (36.5 C)  SpO2: 100% 100%   General Appearance: In no apparent distress, laying in bed, obese  HEENT: Normocephalic, atraumatic.  Neck: Supple, trachea midline, no JVDs Cardiovascular: Regular rate and rhythm, normal S1-S2,  no murmur Respiratory: Resting breathing unlabored, lungs sounds diminished bilaterally, no use of accessory muscles. On 3LNC. Speaks full sentence  Gastrointestinal: Bowel sounds positive, abdomen soft Extremities: Able to move all extremities  in bed without difficulty, BLE 1+ pitting edema  Musculoskeletal: Normal muscle bulk and tone Skin: Intact, warm, dry. No rashes or petechiae noted in exposed areas.  Neurologic: Alert, oriented to person, place and time.  no gross focal neuro deficit Psychiatric: Normal affect. Mood is appropriate.     EKG:  The EKG was personally reviewed and demonstrates:    EKG from 09/22/23 16:37 showed sinus bradycardia 48 bpm, PVCs, RBBB  EKG from 09/22/23 18:33 showed sinus bradycardia 51 bpm, RBBB, artifacts   EKG from 09/22/23 at 21:06 showed sinus bradycardia 51 bpm, RBBB  EKG from 09/23/23 at 07:26 showed sinus bradycardia 56 bpm, RBBB  Telemetry:  Telemetry was personally reviewed and demonstrates:    Sinus rhythm 60s   Relevant CV Studies:   Echo from today:   1. Left ventricular ejection fraction, by estimation, is 60 to 65%. The  left ventricle has normal function. The left ventricle has no regional  wall motion abnormalities. The left ventricular internal cavity size was  mildly dilated. Left ventricular  diastolic parameters are indeterminate. Elevated left ventricular  end-diastolic pressure.   2. Right ventricular systolic function is moderately reduced. The right  ventricular size is severely enlarged.   3. Right atrial size was mildly dilated.   4. The mitral valve is degenerative. Moderate mitral valve regurgitation.  No evidence of mitral stenosis.   5. The aortic valve is tricuspid. Aortic valve regurgitation is not  visualized. Aortic valve sclerosis/calcification is present, without any  evidence of aortic stenosis. Aortic valve area, by VTI measures 2.18 cm.  Aortic valve mean gradient measures  5.0 mmHg. Aortic valve Vmax measures 1.48 m/s.  Echo from 04/09/23:   1. No significant change from echo done in NOv 2022.   2. Left ventricular ejection fraction, by estimation, is 70 to 75%. The  left ventricle has hyperdynamic function. The left ventricle has no   regional wall motion abnormalities. There is mild left ventricular  hypertrophy. Left ventricular diastolic  parameters are consistent with Grade II diastolic dysfunction  (pseudonormalization). Elevated left atrial pressure.   3. Right ventricular systolic function is mildly reduced. The right  ventricular size is mildly enlarged.   4. Left atrial size was mildly dilated.   5. The mitral valve is normal in structure. Mild mitral valve  regurgitation.   6. The aortic valve is normal in structure. Aortic valve regurgitation is  not visualized.   7. The inferior vena cava is dilated in size with <50% respiratory  variability, suggesting right atrial pressure of 15 mmHg.      Laboratory Data:  High Sensitivity Troponin:   Recent Labs  Lab 09/22/23 1725 09/22/23 1843  TROPONINIHS 17 17     Chemistry Recent Labs  Lab 09/22/23 1040 09/22/23 1101 09/22/23 2032 09/23/23 0630  NA 144 143 145 148*  K 3.7 3.7 3.2* 3.6  CL 96* 92* 94* 96*  CO2 38*  --  39* 44*  GLUCOSE 100* 102* 74 85  BUN 22 29* 22 20  CREATININE 1.26* 1.50* 1.04* 1.03*  CALCIUM 9.6  --  9.7 9.5  MG  --   --  2.6* 2.4  GFRNONAA 42*  --  52* 53*  ANIONGAP 10  --  12 8    Recent Labs  Lab 09/22/23 1040 09/23/23 0630  PROT 6.5 6.3*  ALBUMIN 3.1* 2.9*  AST 28 21  ALT 14 13  ALKPHOS 102 98  BILITOT 0.6 0.5   Lipids No results for input(s): "CHOL", "TRIG", "HDL", "LABVLDL", "LDLCALC", "CHOLHDL" in the last 168 hours.  Hematology Recent Labs  Lab 09/22/23 1040 09/22/23 1101 09/22/23 2030 09/22/23 2032 09/23/23 0630  WBC 3.9*  --   --  2.3* 3.2*  RBC 3.17*  --  3.43* 3.75* 2.88*  HGB 7.4* 9.9*  --  8.7* 6.8*  HCT 28.7* 29.0*  --  34.2* 26.1*  MCV 90.5  --   --  91.2 90.6  MCH 23.3*  --   --  23.2* 23.6*  MCHC 25.8*  --   --  25.4* 26.1*  RDW 21.2*  --   --  21.2* 21.2*  PLT 102*  --   --  67* 95*   Thyroid  Recent Labs  Lab 09/22/23 2030 09/22/23 2032  TSH 4.321  --   FREET4  --  0.95     BNP Recent Labs  Lab 09/22/23 1725  BNP 1,085.8*    DDimer No results for input(s): "DDIMER" in the last 168 hours.   Radiology/Studies:  ECHOCARDIOGRAM COMPLETE Result Date: 09/23/2023    ECHOCARDIOGRAM REPORT   Patient Name:   ALISSIA LORY Date of Exam: 09/23/2023 Medical Rec #:  [id number]        Height:       60.0 in Accession #:    [id number]       Weight:       206.3 lb Date of Birth:  [birth year]        BSA:          1.892 m Patient Age:    88 years         BP:  129/54 mmHg Patient Gender: F                HR:           58 bpm. Exam Location:  Inpatient Procedure: 2D Echo, Cardiac Doppler and Color Doppler Indications:    CHF-Acute Diastolic I50.31  History:        Patient has prior history of Echocardiogram examinations, most                 recent 04/09/2023. CAD, Prior CABG; Risk Factors:Diabetes and                 Hypertension.  Sonographer:    Darlys Gales Referring Phys: Therisa Doyne IMPRESSIONS  1. Left ventricular ejection fraction, by estimation, is 60 to 65%. The left ventricle has normal function. The left ventricle has no regional wall motion abnormalities. The left ventricular internal cavity size was mildly dilated. Left ventricular diastolic parameters are indeterminate. Elevated left ventricular end-diastolic pressure.  2. Right ventricular systolic function is moderately reduced. The right ventricular size is severely enlarged.  3. Right atrial size was mildly dilated.  4. The mitral valve is degenerative. Moderate mitral valve regurgitation. No evidence of mitral stenosis.  5. The aortic valve is tricuspid. Aortic valve regurgitation is not visualized. Aortic valve sclerosis/calcification is present, without any evidence of aortic stenosis. Aortic valve area, by VTI measures 2.18 cm. Aortic valve mean gradient measures 5.0 mmHg. Aortic valve Vmax measures 1.48 m/s. FINDINGS  Left Ventricle: Left ventricular ejection fraction, by estimation, is 60 to 65%. The  left ventricle has normal function. The left ventricle has no regional wall motion abnormalities. The left ventricular internal cavity size was mildly dilated. There is  no left ventricular hypertrophy. Left ventricular diastolic parameters are indeterminate. Elevated left ventricular end-diastolic pressure. Right Ventricle: The right ventricular size is severely enlarged. No increase in right ventricular wall thickness. Right ventricular systolic function is moderately reduced. Left Atrium: Left atrial size was normal in size. Right Atrium: Right atrial size was mildly dilated. Pericardium: There is no evidence of pericardial effusion. Mitral Valve: The mitral valve is degenerative in appearance. There is mild calcification of the mitral valve leaflet(s). Moderate mitral valve regurgitation. No evidence of mitral valve stenosis. Tricuspid Valve: The tricuspid valve is normal in structure. Tricuspid valve regurgitation is mild . No evidence of tricuspid stenosis. Aortic Valve: The aortic valve is tricuspid. Aortic valve regurgitation is not visualized. Aortic valve sclerosis/calcification is present, without any evidence of aortic stenosis. Aortic valve mean gradient measures 5.0 mmHg. Aortic valve peak gradient measures 8.8 mmHg. Aortic valve area, by VTI measures 2.18 cm. Pulmonic Valve: The pulmonic valve was normal in structure. Pulmonic valve regurgitation is trivial. No evidence of pulmonic stenosis. Aorta: The aortic root is normal in size and structure. Venous: The inferior vena cava was not well visualized. IAS/Shunts: No atrial level shunt detected by color flow Doppler.  LEFT VENTRICLE PLAX 2D LVIDd:         5.40 cm   Diastology LVIDs:         3.00 cm   LV e' medial:    5.55 cm/s LV PW:         0.90 cm   LV E/e' medial:  22.7 LV IVS:        0.70 cm   LV e' lateral:   7.51 cm/s LVOT diam:     1.80 cm   LV E/e' lateral: 16.8 LV SV:  78 LV SV Index:   41 LVOT Area:     2.54 cm  RIGHT VENTRICLE RV  Basal diam:  4.90 cm RV Mid diam:    4.20 cm RV S prime:     8.05 cm/s TAPSE (M-mode): 1.5 cm LEFT ATRIUM             Index        RIGHT ATRIUM           Index LA Vol (A2C):   73.3 ml 38.75 ml/m  RA Area:     19.40 cm LA Vol (A4C):   49.5 ml 26.17 ml/m  RA Volume:   58.80 ml  31.09 ml/m LA Biplane Vol: 61.9 ml 32.72 ml/m  AORTIC VALVE AV Area (Vmax):    1.94 cm AV Area (Vmean):   2.13 cm AV Area (VTI):     2.18 cm AV Vmax:           148.00 cm/s AV Vmean:          99.100 cm/s AV VTI:            0.358 m AV Peak Grad:      8.8 mmHg AV Mean Grad:      5.0 mmHg LVOT Vmax:         113.00 cm/s LVOT Vmean:        82.900 cm/s LVOT VTI:          0.306 m LVOT/AV VTI ratio: 0.85  AORTA Ao Root diam: 2.90 cm MITRAL VALVE                  TRICUSPID VALVE MV Area (PHT): 3.46 cm       TR Peak grad:   55.4 mmHg MV Decel Time: 219 msec       TR Vmax:        372.00 cm/s MR Peak grad:    122.3 mmHg MR Mean grad:    90.0 mmHg    SHUNTS MR Vmax:         553.00 cm/s  Systemic VTI:  0.31 m MR Vmean:        458.0 cm/s   Systemic Diam: 1.80 cm MR PISA:         6.28 cm MR PISA Eff ROA: 43 mm MR PISA Radius:  1.00 cm MV E velocity: 126.00 cm/s MV A velocity: 125.00 cm/s MV E/A ratio:  1.01 Armanda Magic MD Electronically signed by Armanda Magic MD Signature Date/Time: 09/23/2023/10:08:55 AM    Final    DG Chest 2 View Result Date: 09/22/2023 CLINICAL DATA:  Shortness of breath and weakness for 5 days EXAM: CHEST - 2 VIEW COMPARISON:  X-ray 04/17/2023 and older FINDINGS: Status post median sternotomy. Enlarged cardiopericardial silhouette calcified aorta. Vascular congestion. Elevation of the right hemidiaphragm. Small pleural effusions. Left midlung atelectasis. Film is under penetrated. Degenerative changes noted along the spine and shoulders. Fixation hardware seen along the lower cervical spine at the edge of the imaging field. IMPRESSION: Postop chest with enlarged heart and vascular congestion. Small effusions. Right greater  than left. Under penetrated radiograph Electronically Signed   By: Karen Kays M.D.   On: 09/22/2023 17:08     Assessment and Plan:   Acute on Chronic diastolic heart failure  Mitral regurgitation CAD s/p CABG 1996  - presented to ER for abnormal labs by PCP, family felt chronic DOE slightly worse, had worsen leg edema required additional lasix over the weekend  - BNP 1085,  POA  - CXR with vascular congestion, small R>L pleural effusion - Echo today with LVEF 60-65%, no RWMA, indeterminate diastolic parameter, elevated LVEDP, mod reduced RV, severe RV enlargement, mild RAE, mod MR aortic sclerosis, IVC not well visualized  - Clinical picture likely multifactorial from acute CHF, symptomatic anemia, advanced age, decondition, and hypoalbuminemia  - She was felt volume overloaded and started IV Lasix 40mg  BID by primary team, on PTA Lasix 20mg  daily, Net -2.6L. Admission labs showed AKI with Cr 1.5 and now 1.03. Will continue IV diuresis at current dose, please track daily weight, I&O, labs  - GDMT: PTA toprolol XL 100mg  held due to sinus bradycardia 50s, HR now 60s, would reduce to 25mg  daily and monitor bradycardia; will add low dose losartan 25mg  daily; appears already on Farixga 10mg  PTA can be resume when appropriate    Persistent A fib - currently in sinus rhythm  - metoprolol XL 100mg  held due to bradycardia, will resume at 25mg  daily  - PTA eliquis held due to worsening  anemia, FOBT pending, agree with holding until more GI workup, would also hold if PLT ever<50k  Acute on chronic iron deficiency anemia  - consider iron infusion if indicated  - keep Hgb >7  - defer GI bleed workup to primary team   Acute on chronic hypoxic respiratory failure  Chronic pancytopenia  CKD III HTN HLD DM - per primary team     Risk Assessment/Risk Scores:     New York Heart Association (NYHA) Functional Class NYHA Class II  CHA2DS2-VASc Score = 7  This indicates a 11.2% annual risk of  stroke. The patient's score is based upon: CHF History: 1 HTN History: 1 Diabetes History: 1 Stroke History: 0 Vascular Disease History: 1 Age Score: 2 Gender Score: 1     For questions or updates, please contact [facility] Please consult [email or web address] for contact info under    Signed, Cyndi Bender, NP  09/23/2023 1:11 PM

## 2023-09-23 NOTE — Plan of Care (Signed)
Problem: Fluid Volume: Goal: Ability to maintain a balanced intake and output will improve Outcome: Progressing   Problem: Metabolic: Goal: Ability to maintain appropriate glucose levels will improve Outcome: Progressing   Problem: Nutritional: Goal: Maintenance of adequate nutrition will improve Outcome: Progressing   Problem: Education: Goal: Knowledge of General Education information will improve Description: Including pain rating scale, medication(s)/side effects and non-pharmacologic comfort measures Outcome: Progressing

## 2023-09-23 NOTE — ED Notes (Signed)
Patient took off of Bipap per RT's okay, placed on 5L for transport, tolerating well.

## 2023-09-23 NOTE — ED Notes (Signed)
Pahwani, MD paged to alert of Hgb result.

## 2023-09-23 NOTE — ED Notes (Addendum)
This nurse asked about the patient's insulin and whether to give it. Considering the patient's CBG was 68 at 2055 09/22/23 and to 131 at 2235 09/22/23 after given apple juice and graham crackers. Attending Therisa Doyne, MD stated "For this one value can hold off she will be Npo from now on so can just monitor. If she starts to go above 150 can restart sliding scale."

## 2023-09-23 NOTE — Consult Note (Signed)
[facility] Gastroenterology Consult  Referring Provider: No ref. provider found Primary Care Physician:  Eloisa Northern, MD Primary Gastroenterologist: [facility]-Dr. Dulce Sellar  Reason for Consultation: Peptic ulcer disease and anemia  SUBJECTIVE:   HPI: Erika Gates is a 89 y.o. female with past medical history significant for paroxysmal atrial fibrillation (last dose of Eliquis on 09/21/2023), coronary artery disease and diabetes mellitus.  Presented to hospital on 09/22/2023 for shortness of breath and fatigue.  Patient noted that she has been experiencing some constipation prior to admission, denied any blood per rectum.  No nausea or vomiting.  Having foot and leg pain.  No chest pain.  Having some shortness of breath.  Upon talking with patient's daughter, Erika Gates, whom patient lives with, noted that patient's stool has been dark in color, though this was attributed to iron supplementation.  Underwent EGD on 04/13/2023 for anemia and fecal occult positive stool by Dr. Dulce Sellar which showed small hiatal hernia, gastric body erosions, duodenal bulb erythema, superficial duodenal ulcers with largest dimension 4 mm.  Presentation, hemoglobin 6.8, WBC 3.2, platelet 95, BUN/creatinine 20/1.03.  Chest x-ray on 09/22/2023 showed small effusions right greater than left, enlarged heart.  Past Medical History:  Diagnosis Date   Allergic rhinitis    Anemia    Anxiety    Coronary atherosclerosis of unspecified type of vessel, native or graft    Diabetes mellitus    DJD (degenerative joint disease)    Hypercholesteremia    Hypertension    Low back pain    Morbid obesity (HCC)    Venous insufficiency    Vitamin D deficiency    Past Surgical History:  Procedure Laterality Date   ABDOMINAL HYSTERECTOMY     ANTERIOR CERVICAL DECOMP/DISCECTOMY FUSION N/A 08/14/2022   Procedure: Cerivcal Three-Four, Cerivcal Four-Five, Cerivcal Five-Six Anterior Cervical Discectomy Fusion;  Surgeon: Barnett Abu, MD;  Location:  [facility] OR;  Service: Neurosurgery;  Laterality: N/A;  RM 19   CORONARY ARTERY BYPASS GRAFT     3 vessel- Dr. Laneta Simmers 8/96   ESOPHAGOGASTRODUODENOSCOPY (EGD) WITH PROPOFOL Left 04/13/2023   Procedure: ESOPHAGOGASTRODUODENOSCOPY (EGD) WITH PROPOFOL;  Surgeon: Willis Modena, MD;  Location: [facility] ENDOSCOPY;  Service: Gastroenterology;  Laterality: Left;   Prior to Admission medications   Medication Sig Start Date End Date Taking? Authorizing Provider  acetaminophen (TYLENOL) 325 MG tablet Take 650 mg by mouth 2 (two) times daily as needed for moderate pain, fever or headache.   Yes [provider]  ammonium lactate (LAC-HYDRIN) 12 % lotion Apply 1 Application topically daily as needed for dry skin.   Yes [provider]  apixaban (ELIQUIS) 5 MG TABS tablet Take 1 tablet (5 mg total) by mouth 2 (two) times daily. 11/23/22  Yes Dyann Kief, PA-C  Cholecalciferol (VITAMIN D-3 PO) Take 1 tablet by mouth daily.   Yes [provider]  clonazePAM (KLONOPIN) 0.5 MG tablet Take 0.5 mg by mouth 2 (two) times daily. 09/22/23  Yes [provider]  dapagliflozin propanediol (FARXIGA) 10 MG TABS tablet Take 1 tablet (10 mg total) by mouth daily before breakfast. 02/02/22  Yes Lyn Records, MD  diclofenac sodium (VOLTAREN) 1 % GEL Apply 2 g topically daily as needed (pain). 08/27/22  Yes Rai, Ripudeep K, MD  docusate sodium (COLACE) 100 MG capsule Take 1 capsule (100 mg total) by mouth 2 (two) times daily. Patient taking differently: Take 100 mg by mouth daily as needed (constipation). 08/27/22  Yes Rai, Ripudeep K, MD  furosemide (LASIX) 20  MG tablet Take 1 tablet (20 mg total) by mouth daily. 07/18/21 09/22/23 Yes Meredeth Ide, MD  gabapentin (NEURONTIN) 100 MG capsule Take 1 capsule (100 mg total) by mouth at bedtime. Patient taking differently: Take 100 mg by mouth daily. 03/17/18  Yes Gearldine Bienenstock, PA-C  lidocaine 4 % Place 1 patch onto the skin daily as needed (pain). 09/25/22  Yes  [provider]  lisinopril (ZESTRIL) 20 MG tablet Take 1 tablet (20 mg total) by mouth daily. 07/18/21 09/22/23 Yes Meredeth Ide, MD  metoprolol succinate (TOPROL-XL) 100 MG 24 hr tablet Take 1 tablet (100 mg total) by mouth daily. 10/27/22  Yes Dyann Kief, PA-C  Multiple Vitamins-Minerals (MULTIVITAMIN WOMEN 50+) TABS Take 1 tablet by mouth daily.   Yes [provider]  pantoprazole (PROTONIX) 40 MG tablet Take 1 tablet (40 mg total) by mouth daily. 08/27/22  Yes Rai, Ripudeep K, MD  polyethylene glycol (MIRALAX / GLYCOLAX) 17 g packet Take 17 g by mouth daily as needed (constipation). 09/25/22  Yes [provider]  rosuvastatin (CRESTOR) 20 MG tablet Take 1 tablet (20 mg total) by mouth daily. 01/12/23  Yes Jake Bathe, MD  VITAMIN E PO Take 1 capsule by mouth daily.   Yes [provider]   Current Facility-Administered Medications  Medication Dose Route Frequency Provider Last Rate Last Admin   0.9 %  sodium chloride infusion (Manually program via Guardrails IV Fluids)   Intravenous Once Hughie Closs, MD       acetaminophen (TYLENOL) tablet 650 mg  650 mg Oral Q6H PRN Therisa Doyne, MD       Or   acetaminophen (TYLENOL) suppository 650 mg  650 mg Rectal Q6H PRN Doutova, Anastassia, MD       clonazePAM (KLONOPIN) tablet 0.5 mg  0.5 mg Oral BID Doutova, Anastassia, MD       docusate sodium (COLACE) capsule 100 mg  100 mg Oral Daily PRN Doutova, Anastassia, MD       furosemide (LASIX) injection 40 mg  40 mg Intravenous BID Hughie Closs, MD       liver oil-zinc oxide (DESITIN) 40 % ointment   Topical BID Hughie Closs, MD       ondansetron (ZOFRAN) tablet 4 mg  4 mg Oral Q6H PRN Doutova, Anastassia, MD       Or   ondansetron (ZOFRAN) injection 4 mg  4 mg Intravenous Q6H PRN Doutova, Anastassia, MD       pantoprazole (PROTONIX) injection 40 mg  40 mg Intravenous Q12H Doutova, Anastassia, MD   40 mg at 09/23/23 1202   rosuvastatin (CRESTOR) tablet 20  mg  20 mg Oral Daily Doutova, Anastassia, MD   20 mg at 09/23/23 1202   Allergies as of 09/22/2023 - Review Complete 09/22/2023  Allergen Reaction Noted   Cardizem [diltiazem] Rash 10/27/2022   Glucophage [metformin] Other (See Comments)    Family History  Problem Relation Age of Onset   Heart disease Mother    Heart disease Brother    Social History   Socioeconomic History   Marital status: Widowed    Spouse name: Not on file   Number of children: Not on file   Years of education: Not on file   Highest education level: Not on file  Occupational History   Occupation: retired  Tobacco Use   Smoking status: Never   Smokeless tobacco: Never  Vaping Use   Vaping status: Never Used  Substance and Sexual Activity  Alcohol use: No   Drug use: Never   Sexual activity: Not on file  Other Topics Concern   Not on file  Social History Narrative   Not on file   Social Drivers of Health   Financial Resource Strain: Low Risk  (08/27/2022)   Received from [facility], [facility]   Overall Financial Resource Strain (CARDIA)    Difficulty of Paying Living Expenses: Not hard at all  Food Insecurity: No Food Insecurity (09/23/2023)   Hunger Vital Sign    Worried About Running Out of Food in the Last Year: Never true    Ran Out of Food in the Last Year: Never true  Transportation Needs: No Transportation Needs (09/23/2023)   PRAPARE - Administrator, Civil Service (Medical): No    Lack of Transportation (Non-Medical): No  Physical Activity: Not on file  Stress: Not on file  Social Connections: Moderately Isolated (09/23/2023)   Social Connection and Isolation Panel [NHANES]    Frequency of Communication with Friends and Family: Once a week    Frequency of Social Gatherings with Friends and Family: More than three times a week    Attends Religious Services: 1 to 4 times per year    Active Member of [employer] or Organizations: No    Attends  Banker Meetings: Never    Marital Status: Widowed  Intimate Partner Violence: Not At Risk (09/23/2023)   Humiliation, Afraid, Rape, and Kick questionnaire    Fear of Current or Ex-Partner: No    Emotionally Abused: No    Physically Abused: No    Sexually Abused: No   Review of Systems:  Review of Systems  Respiratory:  Positive for shortness of breath.   Cardiovascular:  Negative for chest pain.  Gastrointestinal:  Positive for constipation. Negative for abdominal pain, blood in stool, nausea and vomiting.    OBJECTIVE:   Temp:  [97 F (36.1 C)-98.1 F (36.7 C)] 97.6 F (36.4 C) (01/30 1043) Pulse Rate:  [49-97] 64 (01/30 1043) Resp:  [12-20] 19 (01/30 1043) BP: (108-143)/(43-76) 117/65 (01/30 1043) SpO2:  [97 %-100 %] 100 % (01/30 1043) FiO2 (%):  [40 %] 40 % (01/30 0740) Last BM Date : 09/19/23 Physical Exam Constitutional:      General: She is not in acute distress.    Appearance: She is normal weight. She is not ill-appearing, toxic-appearing or diaphoretic.  Cardiovascular:     Rate and Rhythm: Normal rate and regular rhythm.  Pulmonary:     Effort: No respiratory distress.     Breath sounds: Normal breath sounds.     Comments: Supplemental oxygen nasal cannula in place, 5 L Abdominal:     General: Bowel sounds are normal. There is no distension.     Palpations: Abdomen is soft.     Tenderness: There is no abdominal tenderness. There is no guarding.  Musculoskeletal:     Right lower leg: Edema present.     Left lower leg: Edema present.  Skin:    General: Skin is warm and dry.  Neurological:     Mental Status: She is alert.     Labs: Recent Labs    09/22/23 1040 09/22/23 1101 09/22/23 2032 09/23/23 0630  WBC 3.9*  --  2.3* 3.2*  HGB 7.4* 9.9* 8.7* 6.8*  HCT 28.7* 29.0* 34.2* 26.1*  PLT 102*  --  67* 95*   BMET Recent Labs    09/22/23 1040 09/22/23 1101 09/22/23 2032  09/23/23 0630  NA 144 143 145 148*  K 3.7 3.7 3.2* 3.6  CL 96*  92* 94* 96*  CO2 38*  --  39* 44*  GLUCOSE 100* 102* 74 85  BUN 22 29* 22 20  CREATININE 1.26* 1.50* 1.04* 1.03*  CALCIUM 9.6  --  9.7 9.5   LFT Recent Labs    09/23/23 0630  PROT 6.3*  ALBUMIN 2.9*  AST 21  ALT 13  ALKPHOS 98  BILITOT 0.5   PT/INR No results for input(s): "LABPROT", "INR" in the last 72 hours.  Diagnostic imaging: ECHOCARDIOGRAM COMPLETE Result Date: 09/23/2023    ECHOCARDIOGRAM REPORT   Patient Name:   Erika Gates Date of Exam: 09/23/2023 Medical Rec #:  [id number]        Height:       60.0 in Accession #:    [id number]       Weight:       206.3 lb Date of Birth:  March 13, 1935        BSA:          1.892 m Patient Age:    88 years         BP:           129/54 mmHg Patient Gender: F                HR:           58 bpm. Exam Location:  Inpatient Procedure: 2D Echo, Cardiac Doppler and Color Doppler Indications:    CHF-Acute Diastolic I50.31  History:        Patient has prior history of Echocardiogram examinations, most                 recent 04/09/2023. CAD, Prior CABG; Risk Factors:Diabetes and                 Hypertension.  Sonographer:    Darlys Gales Referring Phys: Therisa Doyne IMPRESSIONS  1. Left ventricular ejection fraction, by estimation, is 60 to 65%. The left ventricle has normal function. The left ventricle has no regional wall motion abnormalities. The left ventricular internal cavity size was mildly dilated. Left ventricular diastolic parameters are indeterminate. Elevated left ventricular end-diastolic pressure.  2. Right ventricular systolic function is moderately reduced. The right ventricular size is severely enlarged.  3. Right atrial size was mildly dilated.  4. The mitral valve is degenerative. Moderate mitral valve regurgitation. No evidence of mitral stenosis.  5. The aortic valve is tricuspid. Aortic valve regurgitation is not visualized. Aortic valve sclerosis/calcification is present, without any evidence of aortic stenosis. Aortic valve area,  by VTI measures 2.18 cm. Aortic valve mean gradient measures 5.0 mmHg. Aortic valve Vmax measures 1.48 m/s. FINDINGS  Left Ventricle: Left ventricular ejection fraction, by estimation, is 60 to 65%. The left ventricle has normal function. The left ventricle has no regional wall motion abnormalities. The left ventricular internal cavity size was mildly dilated. There is  no left ventricular hypertrophy. Left ventricular diastolic parameters are indeterminate. Elevated left ventricular end-diastolic pressure. Right Ventricle: The right ventricular size is severely enlarged. No increase in right ventricular wall thickness. Right ventricular systolic function is moderately reduced. Left Atrium: Left atrial size was normal in size. Right Atrium: Right atrial size was mildly dilated. Pericardium: There is no evidence of pericardial effusion. Mitral Valve: The mitral valve is degenerative in appearance. There is mild calcification of the mitral valve leaflet(s). Moderate mitral valve regurgitation. No evidence of mitral  valve stenosis. Tricuspid Valve: The tricuspid valve is normal in structure. Tricuspid valve regurgitation is mild . No evidence of tricuspid stenosis. Aortic Valve: The aortic valve is tricuspid. Aortic valve regurgitation is not visualized. Aortic valve sclerosis/calcification is present, without any evidence of aortic stenosis. Aortic valve mean gradient measures 5.0 mmHg. Aortic valve peak gradient measures 8.8 mmHg. Aortic valve area, by VTI measures 2.18 cm. Pulmonic Valve: The pulmonic valve was normal in structure. Pulmonic valve regurgitation is trivial. No evidence of pulmonic stenosis. Aorta: The aortic root is normal in size and structure. Venous: The inferior vena cava was not well visualized. IAS/Shunts: No atrial level shunt detected by color flow Doppler.  LEFT VENTRICLE PLAX 2D LVIDd:         5.40 cm   Diastology LVIDs:         3.00 cm   LV e' medial:    5.55 cm/s LV PW:         0.90 cm    LV E/e' medial:  22.7 LV IVS:        0.70 cm   LV e' lateral:   7.51 cm/s LVOT diam:     1.80 cm   LV E/e' lateral: 16.8 LV SV:         78 LV SV Index:   41 LVOT Area:     2.54 cm  RIGHT VENTRICLE RV Basal diam:  4.90 cm RV Mid diam:    4.20 cm RV S prime:     8.05 cm/s TAPSE (M-mode): 1.5 cm LEFT ATRIUM             Index        RIGHT ATRIUM           Index LA Vol (A2C):   73.3 ml 38.75 ml/m  RA Area:     19.40 cm LA Vol (A4C):   49.5 ml 26.17 ml/m  RA Volume:   58.80 ml  31.09 ml/m LA Biplane Vol: 61.9 ml 32.72 ml/m  AORTIC VALVE AV Area (Vmax):    1.94 cm AV Area (Vmean):   2.13 cm AV Area (VTI):     2.18 cm AV Vmax:           148.00 cm/s AV Vmean:          99.100 cm/s AV VTI:            0.358 m AV Peak Grad:      8.8 mmHg AV Mean Grad:      5.0 mmHg LVOT Vmax:         113.00 cm/s LVOT Vmean:        82.900 cm/s LVOT VTI:          0.306 m LVOT/AV VTI ratio: 0.85  AORTA Ao Root diam: 2.90 cm MITRAL VALVE                  TRICUSPID VALVE MV Area (PHT): 3.46 cm       TR Peak grad:   55.4 mmHg MV Decel Time: 219 msec       TR Vmax:        372.00 cm/s MR Peak grad:    122.3 mmHg MR Mean grad:    90.0 mmHg    SHUNTS MR Vmax:         553.00 cm/s  Systemic VTI:  0.31 m MR Vmean:        458.0 cm/s   Systemic Diam: 1.80 cm MR PISA:  6.28 cm MR PISA Eff ROA: 43 mm MR PISA Radius:  1.00 cm MV E velocity: 126.00 cm/s MV A velocity: 125.00 cm/s MV E/A ratio:  1.01 Armanda Magic MD Electronically signed by Armanda Magic MD Signature Date/Time: 09/23/2023/10:08:55 AM    Final    DG Chest 2 View Result Date: 09/22/2023 CLINICAL DATA:  Shortness of breath and weakness for 5 days EXAM: CHEST - 2 VIEW COMPARISON:  X-ray 04/17/2023 and older FINDINGS: Status post median sternotomy. Enlarged cardiopericardial silhouette calcified aorta. Vascular congestion. Elevation of the right hemidiaphragm. Small pleural effusions. Left midlung atelectasis. Film is under penetrated. Degenerative changes noted along the spine and  shoulders. Fixation hardware seen along the lower cervical spine at the edge of the imaging field. IMPRESSION: Postop chest with enlarged heart and vascular congestion. Small effusions. Right greater than left. Under penetrated radiograph Electronically Signed   By: Karen Kays M.D.   On: 09/22/2023 17:08   IMPRESSION: Symptomatic anemia History peptic ulcer disease  Paroxysmal atrial fibrillation, on Eliquis prior to admission (last dose 09/21/23) Constipation  Coronary artery disease Diabetes mellitus  PLAN: -No signs of acute GI bleeding at this time, monitor bowel movements -Pending blood transfusion, trend H/H -IV PPI Q12Hr -Hold Eliquis -Recommend EGD to further evaluate symptomatic anemia in setting of peptic ulcer disease in August 2024, discussed this with patient who deferred this decision to her daughter, Erika Gates -Had conversation with patient's daughter and grandson, Erika Gates, via telephone, discussed EGD procedure including benefits, alternatives and risks of bleeding/infection/perforation/anesthesia, they verbalized understanding and elected to proceed, they hoped to complete EGD on 09/25/23 in order to be at bedside -If EGD unremarkable, would plan to deploy video capsule endoscopy, family in agreement -Ok for diet today and tomorrow, tentative plan for EGD on 09/25/23 -[facility] will follow   LOS: 1 day   Liliane Shi, DO [facility] Gastroenterology

## 2023-09-23 NOTE — Plan of Care (Signed)
Discussed with patient plan of care for the evening, pain management and potential use of pure wick with lasix and her dyspnea upon getting up to bedside commode with some teach back displayed  Problem: Education: Goal: Ability to describe self-care measures that may prevent or decrease complications (Diabetes Survival Skills Education) will improve Outcome: Not Progressing   Problem: Pain Managment: Goal: General experience of comfort will improve and/or be controlled Outcome: Progressing

## 2023-09-23 NOTE — ED Notes (Signed)
Paged Dr Jacqulyn Bath for RN Angelica Chessman

## 2023-09-23 NOTE — Progress Notes (Addendum)
PROGRESS NOTE    Erika Gates  UEA:[id number] DOB: Dec 28, 1934 DOA: 09/22/2023 PCP: Eloisa Northern, MD   Brief Narrative:  Erika Gates is a 89 y.o. female with medical history significant of HTN, HLD, DM-2, PAF on Eliquis, CAD s/p CABG 1996, chronic HFpEF, PUD,  Sinus bradycardia, Pancytopenia, CKD 3a Presented with  fatigue increased SOB for the past 5 days. Primary care provider did some blood work a day prior to presentation that showed hemoglobin 7.4 and 91 platelets. Blood pressure 124/60 satting 93% on home 3 L. Patient still endorsing shortness of breath has had problems with anemia in the past has been using Eliquis. Reports no black stools no blood in stools.  Last EGD was done August 2024 which showed erosive gastropathy and nonbleeding duodenal ulcer.  Also, during prior admission, she had bradycardia with rates in 30s and TSH was borderline suggestive of sick euthyroid syndrome.  Since heart rate rebounded, beta-blocker was resumed.  Assessment & Plan:   Principal Problem:   Acute on chronic diastolic CHF (congestive heart failure) (HCC) Active Problems:   Acute respiratory failure with hypoxia and hypercapnia (HCC)   Sinus bradycardia   Pancytopenia (HCC)   Paroxysmal atrial fibrillation (HCC)   HYPERCHOLESTEROLEMIA   Essential hypertension   History of coronary artery disease s/p CABG    CKD (chronic kidney disease) stage 3, GFR 30-59 ml/min (HCC)   DM (diabetes mellitus) type II controlled with renal manifestation (HCC)   Hypoglycemia   Symptomatic anemia  Acute on chronic hypoxic respiratory failure, POA: Multifactorial, could be due to anemia as well as acute on chronic congestive heart failure with preserved ejection fraction: Uses 3 L of oxygen at baseline but currently on BiPAP.  Treat underlying causes.  See below.  Acute on chronic anemia: Baseline hemoglobin appears to be between 8-8.5, came in with 8.7 which has now dropped to 6.8 today.  Will transfuse 1 unit  PRBC and repeat H&H post-aspiration.  Will hold Eliquis.  Iron studies indicate iron deficiency anemia.  FOBT pending.  Pancytopenia: Platelets and WBCs have remained low and they are at baseline.  No indication of transfusion for platelets at the moment since it is 95.  Symptomatic acute on chronic congestive heart failure with preserved ejection fraction, POA: Chest x-ray shows vascular congestion, BNP elevated but appears to be at baseline.  Had crackles upon arrival.  Recent echo done in August shows 70 to 75% ejection fraction with grade 2 diastolic dysfunction.  Appears to be taking Lasix 20 mg p.o. daily.  Received 1 dose of 40 mg IV and has been started on that.  Will increase dose to 40 mg IV twice daily with a strict I's and O's, daily weight and low-sodium diet.  Also, heart failure team is consulted so will defer further management to them.  PAF/sinus bradycardia: In sinus rhythm but rate in bradycardia range.  Hold Toprol-XL and Eliquis as mentioned above.  TSH normal.  Hypertension: Blood pressure low normal.  Hold lisinopril.  Resume Toprol-XL.  Type 2 diabetes mellitus: Appears to be on Farxiga at home.  Hemoglobin A1c only 5.4.  Does not need SSI.  Hyperlipidemia: Resume Crestor.  CKD stage IIIa: At baseline.  Hypernatremia: 148.  Patient asymptomatic.  Continue Lasix.  Monitor daily.  Hypokalemia: Replenished yesterday and normal today.  DVT prophylaxis: SCDs Start: 09/23/23 0559 will avoid heparin   Code Status: Full Code  Family Communication:  None present at bedside.  Plan of care discussed with patient  in length and he/she verbalized understanding and agreed with it.  Status is: Inpatient Remains inpatient appropriate because: Needs IV diuresis and blood transfusion today   Estimated body mass index is 40.3 kg/m as calculated from the following:   Height as of 01/12/23: 5' (1.524 m).   Weight as of 04/22/23: 93.6 kg.  Pressure Injury 04/18/23 Coccyx Medial Stage  2 -  Partial thickness loss of dermis presenting as a shallow open injury with a red, pink wound bed without slough. cracked skin (Active)  04/18/23 0411  Location: Coccyx  Location Orientation: Medial  Staging: Stage 2 -  Partial thickness loss of dermis presenting as a shallow open injury with a red, pink wound bed without slough.  Wound Description (Comments): cracked skin  Present on Admission: No     Pressure Injury 09/23/23 Sacrum Stage 3 -  Full thickness tissue loss. Subcutaneous fat may be visible but bone, tendon or muscle are NOT exposed. 2 cm x 1 cm x 0.1 cm 50% yellow 50% pink moist (Active)  09/23/23 0711  Location: Sacrum  Location Orientation:   Staging: Stage 3 -  Full thickness tissue loss. Subcutaneous fat may be visible but bone, tendon or muscle are NOT exposed.  Wound Description (Comments): 2 cm x 1 cm x 0.1 cm 50% yellow 50% pink moist  Present on Admission: Yes   Nutritional Assessment: There is no height or weight on file to calculate BMI.. Seen by dietician.  I agree with the assessment and plan as outlined below: Nutrition Status:        . Skin Assessment: I have examined the patient's skin and I agree with the wound assessment as performed by the wound care RN as outlined below: Pressure Injury 04/18/23 Coccyx Medial Stage 2 -  Partial thickness loss of dermis presenting as a shallow open injury with a red, pink wound bed without slough. cracked skin (Active)  04/18/23 0411  Location: Coccyx  Location Orientation: Medial  Staging: Stage 2 -  Partial thickness loss of dermis presenting as a shallow open injury with a red, pink wound bed without slough.  Wound Description (Comments): cracked skin  Present on Admission: No     Pressure Injury 09/23/23 Sacrum Stage 3 -  Full thickness tissue loss. Subcutaneous fat may be visible but bone, tendon or muscle are NOT exposed. 2 cm x 1 cm x 0.1 cm 50% yellow 50% pink moist (Active)  09/23/23 0711  Location:  Sacrum  Location Orientation:   Staging: Stage 3 -  Full thickness tissue loss. Subcutaneous fat may be visible but bone, tendon or muscle are NOT exposed.  Wound Description (Comments): 2 cm x 1 cm x 0.1 cm 50% yellow 50% pink moist  Present on Admission: Yes    Consultants:  Cardiology  Procedures:  None  Antimicrobials:  Anti-infectives (From admission, onward)    None         Subjective: Patient seen and examined, she says that her breathing is better than yesterday.  Able to speak in full sentences.  No other complaint.  She further adds that the swelling that she has currently is much worse than her baseline and she has noted increased swelling since about a week.  Objective: Vitals:   09/23/23 0145 09/23/23 0254 09/23/23 0300 09/23/23 0505  BP: 119/62  (!) 122/51 (!) 129/54  Pulse: (!) 51  (!) 55 (!) 51  Resp: 14 20 15 12   Temp:    98.1 F (36.7 C)  TempSrc:    Axillary  SpO2: 100%  100% 100%    Intake/Output Summary (Last 24 hours) at 09/23/2023 0741 Last data filed at 09/23/2023 0726 Gross per 24 hour  Intake --  Output 1850 ml  Net -1850 ml   There were no vitals filed for this visit.  Examination:  General exam: Appears calm and comfortable  Respiratory system: Clear to auscultation. Respiratory effort normal. Cardiovascular system: S1 & S2 heard, RRR. No JVD, murmurs, rubs, gallops or clicks.  +3 pitting edema bilateral lower extremity Gastrointestinal system: Abdomen is nondistended, soft and nontender. No organomegaly or masses felt. Normal bowel sounds heard. Central nervous system: Alert and oriented. No focal neurological deficits. Extremities: Symmetric 5 x 5 power. Skin: No rashes, lesions or ulcers  Data Reviewed: I have personally reviewed following labs and imaging studies  CBC: Recent Labs  Lab 09/22/23 1040 09/22/23 1101 09/22/23 2032 09/23/23 0630  WBC 3.9*  --  2.3* 3.2*  NEUTROABS 3.0  --  1.6*  --   HGB 7.4* 9.9* 8.7* 6.8*   HCT 28.7* 29.0* 34.2* 26.1*  MCV 90.5  --  91.2 90.6  PLT 102*  --  67* 95*   Basic Metabolic Panel: Recent Labs  Lab 09/22/23 1040 09/22/23 1101 09/22/23 2032 09/23/23 0630  NA 144 143 145 148*  K 3.7 3.7 3.2* 3.6  CL 96* 92* 94* 96*  CO2 38*  --  39* 44*  GLUCOSE 100* 102* 74 85  BUN 22 29* 22 20  CREATININE 1.26* 1.50* 1.04* 1.03*  CALCIUM 9.6  --  9.7 9.5  MG  --   --  2.6* 2.4  PHOS  --   --  3.4 3.1   GFR: CrCl cannot be calculated (Unknown ideal weight.). Liver Function Tests: Recent Labs  Lab 09/22/23 1040 09/23/23 0630  AST 28 21  ALT 14 13  ALKPHOS 102 98  BILITOT 0.6 0.5  PROT 6.5 6.3*  ALBUMIN 3.1* 2.9*   No results for input(s): "LIPASE", "AMYLASE" in the last 168 hours. No results for input(s): "AMMONIA" in the last 168 hours. Coagulation Profile: No results for input(s): "INR", "PROTIME" in the last 168 hours. Cardiac Enzymes: Recent Labs  Lab 09/22/23 2041  CKTOTAL 27*   BNP (last 3 results) No results for input(s): "PROBNP" in the last 8760 hours. HbA1C: Recent Labs    09/22/23 2100  HGBA1C 5.4   CBG: Recent Labs  Lab 09/22/23 2235 09/22/23 2359 09/23/23 0211 09/23/23 0432 09/23/23 0622  GLUCAP 131* 135* 129* 91 84   Lipid Profile: No results for input(s): "CHOL", "HDL", "LDLCALC", "TRIG", "CHOLHDL", "LDLDIRECT" in the last 72 hours. Thyroid Function Tests: Recent Labs    09/22/23 2030 09/22/23 2032  TSH 4.321  --   FREET4  --  0.95   Anemia Panel: Recent Labs    09/22/23 2030 09/22/23 2032  VITAMINB12 1,413*  --   FOLATE 35.1  --   FERRITIN  --  14  TIBC  --  392  IRON  --  27*  RETICCTPCT 2.2  --    Sepsis Labs: Recent Labs  Lab 09/22/23 2032  PROCALCITON <0.10    Recent Results (from the past 240 hours)  Resp panel by RT-PCR (RSV, Flu A&B, Covid) Anterior Nasal Swab     Status: None   Collection Time: 09/22/23  8:51 PM   Specimen: Anterior Nasal Swab  Result Value Ref Range Status   SARS  Coronavirus 2 by RT PCR NEGATIVE NEGATIVE  Final   Influenza A by PCR NEGATIVE NEGATIVE Final   Influenza B by PCR NEGATIVE NEGATIVE Final    Comment: (NOTE) The Xpert Xpress SARS-CoV-2/FLU/RSV plus assay is intended as an aid in the diagnosis of influenza from Nasopharyngeal swab specimens and should not be used as a sole basis for treatment. Nasal washings and aspirates are unacceptable for Xpert Xpress SARS-CoV-2/FLU/RSV testing.  Fact Sheet for Patients: BloggerCourse.com  Fact Sheet for Healthcare Providers: SeriousBroker.it  This test is not yet approved or cleared by the [place] FDA and has been authorized for detection and/or diagnosis of SARS-CoV-2 by FDA under an Emergency Use Authorization (EUA). This EUA will remain in effect (meaning this test can be used) for the duration of the COVID-19 declaration under Section 564(b)(1) of the Act, 21 U.S.C. section 360bbb-3(b)(1), unless the authorization is terminated or revoked.     Resp Syncytial Virus by PCR NEGATIVE NEGATIVE Final    Comment: (NOTE) Fact Sheet for Patients: BloggerCourse.com  Fact Sheet for Healthcare Providers: SeriousBroker.it  This test is not yet approved or cleared by the [place] FDA and has been authorized for detection and/or diagnosis of SARS-CoV-2 by FDA under an Emergency Use Authorization (EUA). This EUA will remain in effect (meaning this test can be used) for the duration of the COVID-19 declaration under Section 564(b)(1) of the Act, 21 U.S.C. section 360bbb-3(b)(1), unless the authorization is terminated or revoked.  Performed at [facility], [address] [place], [place] [address]      Radiology Studies: DG Chest 2 View Result Date: 09/22/2023 CLINICAL DATA:  Shortness of breath and weakness for 5 days EXAM: CHEST - 2 VIEW COMPARISON:  X-ray 04/17/2023 and older  FINDINGS: Status post median sternotomy. Enlarged cardiopericardial silhouette calcified aorta. Vascular congestion. Elevation of the right hemidiaphragm. Small pleural effusions. Left midlung atelectasis. Film is under penetrated. Degenerative changes noted along the spine and shoulders. Fixation hardware seen along the lower cervical spine at the edge of the imaging field. IMPRESSION: Postop chest with enlarged heart and vascular congestion. Small effusions. Right greater than left. Under penetrated radiograph Electronically Signed   By: Karen Kays M.D.   On: 09/22/2023 17:08    Scheduled Meds:  sodium chloride   Intravenous Once   clonazePAM  0.5 mg Oral BID   furosemide  40 mg Intravenous Daily   liver oil-zinc oxide   Topical BID   pantoprazole (PROTONIX) IV  40 mg Intravenous Q12H   rosuvastatin  20 mg Oral Daily   Continuous Infusions:   LOS: 1 day   Hughie Closs, MD [facility]  09/23/2023, 7:41 AM   *Please note that this is a verbal dictation therefore any spelling or grammatical errors are due to the "Dragon Medical One" system interpretation.  Please page via Amion and do not message via secure chat for urgent patient care matters. Secure chat can be used for non urgent patient care matters.  How to contact the [facility] Attending or Consulting provider 7A - 7P or covering provider during after hours 7P -7A, for this patient?  Check the care team in [facility] and look for a) attending/consulting [facility] provider listed and b) the [facility] team listed. Page or secure chat 7A-7P. Log into [email or web address] and use [facility]'s universal password to access. If you do not have the password, please contact the hospital operator. Locate the [facility] provider you are looking for under [facility] and page to a number that you can be directly reached.  If you still have difficulty reaching the provider, please page the [facility] (Director on Call) for the Hospitalists listed on amion for assistance.

## 2023-09-23 NOTE — ED Notes (Signed)
Pahwani, MD called back and is notified of patients Hgb of 6.8.

## 2023-09-23 NOTE — ED Notes (Signed)
Echo at bedside

## 2023-09-23 NOTE — ED Notes (Addendum)
Patient left the floor in stable condition, no s/s of distress, spoke to patients daughter in route to floor, advised her of the room number. Pahwani, MD aware that blood transfusion has not been initiated since ECHO was at bedside.

## 2023-09-23 NOTE — Progress Notes (Signed)
OT Cancellation Note  Patient Details Name: Erika Gates MRN: [id number] DOB: 03-Jan-1935   Cancelled Treatment:    Reason Eval/Treat Not Completed: Medical issues which prohibited therapy (Per RN, OT to hold at this time as pt with HgB 6.8 with planned transfusion. OT to reattempt to see pt at a later time as appropriate/available.)  Erika Gates "Erika Gates" M., OTR/L, MA Acute Rehab [phone]  Erika Gates 09/23/2023, 11:28 AM

## 2023-09-24 DIAGNOSIS — I4819 Other persistent atrial fibrillation: Secondary | ICD-10-CM | POA: Diagnosis not present

## 2023-09-24 DIAGNOSIS — I5033 Acute on chronic diastolic (congestive) heart failure: Secondary | ICD-10-CM | POA: Diagnosis not present

## 2023-09-24 DIAGNOSIS — I5032 Chronic diastolic (congestive) heart failure: Secondary | ICD-10-CM | POA: Diagnosis not present

## 2023-09-24 DIAGNOSIS — D509 Iron deficiency anemia, unspecified: Secondary | ICD-10-CM | POA: Diagnosis not present

## 2023-09-24 LAB — CBC WITH DIFFERENTIAL/PLATELET
Abs Immature Granulocytes: 0.02 10*3/uL (ref 0.00–0.07)
Basophils Absolute: 0 10*3/uL (ref 0.0–0.1)
Basophils Relative: 0 %
Eosinophils Absolute: 0.1 10*3/uL (ref 0.0–0.5)
Eosinophils Relative: 2 %
HCT: 28.3 % — ABNORMAL LOW (ref 36.0–46.0)
Hemoglobin: 7.8 g/dL — ABNORMAL LOW (ref 12.0–15.0)
Immature Granulocytes: 1 %
Lymphocytes Relative: 10 %
Lymphs Abs: 0.4 10*3/uL — ABNORMAL LOW (ref 0.7–4.0)
MCH: 24.5 pg — ABNORMAL LOW (ref 26.0–34.0)
MCHC: 27.6 g/dL — ABNORMAL LOW (ref 30.0–36.0)
MCV: 88.7 fL (ref 80.0–100.0)
Monocytes Absolute: 0.5 10*3/uL (ref 0.1–1.0)
Monocytes Relative: 14 %
Neutro Abs: 2.7 10*3/uL (ref 1.7–7.7)
Neutrophils Relative %: 73 %
Platelets: 94 10*3/uL — ABNORMAL LOW (ref 150–400)
RBC: 3.19 MIL/uL — ABNORMAL LOW (ref 3.87–5.11)
RDW: 20.4 % — ABNORMAL HIGH (ref 11.5–15.5)
WBC: 3.6 10*3/uL — ABNORMAL LOW (ref 4.0–10.5)
nRBC: 0 % (ref 0.0–0.2)

## 2023-09-24 LAB — BPAM RBC
Blood Product Expiration Date: 202502152359
ISSUE DATE / TIME: 202501301309
Unit Type and Rh: 9500

## 2023-09-24 LAB — BASIC METABOLIC PANEL WITH GFR
Anion gap: 8 (ref 5–15)
BUN: 18 mg/dL (ref 8–23)
CO2: 45 mmol/L — ABNORMAL HIGH (ref 22–32)
Calcium: 9.6 mg/dL (ref 8.9–10.3)
Chloride: 93 mmol/L — ABNORMAL LOW (ref 98–111)
Creatinine, Ser: 0.98 mg/dL (ref 0.44–1.00)
GFR, Estimated: 56 mL/min — ABNORMAL LOW
Glucose, Bld: 90 mg/dL (ref 70–99)
Potassium: 3.3 mmol/L — ABNORMAL LOW (ref 3.5–5.1)
Sodium: 146 mmol/L — ABNORMAL HIGH (ref 135–145)

## 2023-09-24 LAB — TYPE AND SCREEN
ABO/RH(D): O NEG
Antibody Screen: NEGATIVE
Unit division: 0

## 2023-09-24 LAB — T3: T3, Total: 77 ng/dL (ref 71–180)

## 2023-09-24 LAB — GLUCOSE, CAPILLARY
Glucose-Capillary: 83 mg/dL (ref 70–99)
Glucose-Capillary: 147 mg/dL — ABNORMAL HIGH (ref 70–99)
Glucose-Capillary: 104 mg/dL — ABNORMAL HIGH (ref 70–99)
Glucose-Capillary: 104 mg/dL — ABNORMAL HIGH (ref 70–99)

## 2023-09-24 MED ORDER — POTASSIUM CHLORIDE CRYS ER 20 MEQ PO TBCR
40.0000 meq | EXTENDED_RELEASE_TABLET | ORAL | Status: AC
Start: 2023-09-24 — End: 2023-09-24
  Administered 2023-09-24 (×2): 40 meq via ORAL
  Filled 2023-09-24 (×2): qty 2

## 2023-09-24 NOTE — Anesthesia Preprocedure Evaluation (Signed)
Anesthesia Evaluation  Patient identified by MRN, date of birth, ID band Patient awake    Reviewed: Allergy & Precautions, NPO status , Patient's Chart, lab work & pertinent test results  Airway Mallampati: II  TM Distance: >3 FB Neck ROM: Full    Dental no notable dental hx.    Pulmonary neg pulmonary ROS   Pulmonary exam normal        Cardiovascular hypertension, Pt. on home beta blockers and Pt. on medications + CAD, + CABG and +CHF   Rhythm:Regular Rate:Normal  ECHO 01/25:   1. Left ventricular ejection fraction, by estimation, is 60 to 65%. The left ventricle has normal function. The left ventricle has no regional wall motion abnormalities. The left ventricular internal cavity size was mildly dilated. Left ventricular diastolic parameters are indeterminate. Elevated left ventricular end-diastolic pressure.  2. Right ventricular systolic function is moderately reduced. The right ventricular size is severely enlarged.  3. Right atrial size was mildly dilated.  4. The mitral valve is degenerative. Moderate mitral valve regurgitation. No evidence of mitral stenosis.  5. The aortic valve is tricuspid. Aortic valve regurgitation is not visualized. Aortic valve sclerosis/calcification is present, without any evidence of aortic stenosis. Aortic valve area, by VTI measures 2.18 cm. Aortic valve mean gradient measures 5.0 mmHg. Aortic valve Vmax measures 1.48 m/s.      Neuro/Psych   Anxiety     negative neurological ROS     GI/Hepatic Neg liver ROS, PUD,GERD  Medicated,,  Endo/Other  diabetes, Type 2    Renal/GU   negative genitourinary   Musculoskeletal  (+) Arthritis ,    Abdominal Normal abdominal exam  (+)   Peds  Hematology  (+) Blood dyscrasia, anemia Lab Results      Component                Value               Date                      WBC                      3.6 (L)             09/24/2023                HGB                       7.8 (L)             09/24/2023                HCT                      28.3 (L)            09/24/2023                MCV                      88.7                09/24/2023                PLT                      94 (L)  09/24/2023              Anesthesia Other Findings   Reproductive/Obstetrics                             Anesthesia Physical Anesthesia Plan  ASA: 3  Anesthesia Plan: MAC   Post-op Pain Management:    Induction:   PONV Risk Score and Plan: 2 and Propofol infusion and Treatment may vary due to age or medical condition  Airway Management Planned: Simple Face Mask and Nasal Cannula  Additional Equipment: None  Intra-op Plan:   Post-operative Plan:   Informed Consent: I have reviewed the patients History and Physical, chart, labs and discussed the procedure including the risks, benefits and alternatives for the proposed anesthesia with the patient or authorized representative who has indicated his/her understanding and acceptance.     Dental advisory given  Plan Discussed with: CRNA  Anesthesia Plan Comments:        Anesthesia Quick Evaluation

## 2023-09-24 NOTE — Progress Notes (Signed)
[facility] Progress Note  SUBJECTIVE:   Interval history: Erika Gates was seen and evaluated today at bedside. Resting comfortably. Denied bowel movement today. No abdominal pain. Tolerated diet today. No chest pain or shortness of breath. Noted having knee pain. Agreeable to procedure tomorrow.   Past Medical History:  Diagnosis Date   Allergic rhinitis    Anemia    Anxiety    Coronary atherosclerosis of unspecified type of vessel, native or graft    Diabetes mellitus    DJD (degenerative joint disease)    Hypercholesteremia    Hypertension    Low back pain    Morbid obesity (HCC)    Venous insufficiency    Vitamin D deficiency    Past Surgical History:  Procedure Laterality Date   ABDOMINAL HYSTERECTOMY     ANTERIOR CERVICAL DECOMP/DISCECTOMY FUSION N/A 08/14/2022   Procedure: Cerivcal Three-Four, Cerivcal Four-Five, Cerivcal Five-Six Anterior Cervical Discectomy Fusion;  Surgeon: Barnett Abu, MD;  Location: [facility] OR;  Service: Neurosurgery;  Laterality: N/A;  RM 19   CORONARY ARTERY BYPASS GRAFT     3 vessel- Dr. Laneta Simmers 8/96   ESOPHAGOGASTRODUODENOSCOPY (EGD) WITH PROPOFOL Left 04/13/2023   Procedure: ESOPHAGOGASTRODUODENOSCOPY (EGD) WITH PROPOFOL;  Surgeon: Willis Modena, MD;  Location: [facility] ENDOSCOPY;  Service: Gastroenterology;  Laterality: Left;   Current Facility-Administered Medications  Medication Dose Route Frequency Provider Last Rate Last Admin   acetaminophen (TYLENOL) tablet 650 mg  650 mg Oral Q6H PRN Therisa Doyne, MD       Or   acetaminophen (TYLENOL) suppository 650 mg  650 mg Rectal Q6H PRN Therisa Doyne, MD       clonazePAM (KLONOPIN) tablet 0.5 mg  0.5 mg Oral BID Doutova, Anastassia, MD   0.5 mg at 09/24/23 1029   docusate sodium (COLACE) capsule 100 mg  100 mg Oral Daily PRN Therisa Doyne, MD       furosemide (LASIX) injection 40 mg  40 mg Intravenous BID Hughie Closs, MD   40 mg at 09/24/23 0810   insulin aspart  (novoLOG) injection 0-15 Units  0-15 Units Subcutaneous TID WC Marcelino Duster, PA   2 Units at 09/24/23 1327   leptospermum manuka honey (MEDIHONEY) paste 1 Application  1 Application Topical Daily Hughie Closs, MD   1 Application at 09/24/23 1153   liver oil-zinc oxide (DESITIN) 40 % ointment   Topical BID Hughie Closs, MD   Given at 09/24/23 1153   losartan (COZAAR) tablet 25 mg  25 mg Oral Daily Cyndi Bender, NP   25 mg at 09/24/23 1029   metoprolol succinate (TOPROL-XL) 24 hr tablet 25 mg  25 mg Oral Daily Cyndi Bender, NP   25 mg at 09/24/23 1029   ondansetron (ZOFRAN) tablet 4 mg  4 mg Oral Q6H PRN Therisa Doyne, MD       Or   ondansetron (ZOFRAN) injection 4 mg  4 mg Intravenous Q6H PRN Doutova, Anastassia, MD       pantoprazole (PROTONIX) injection 40 mg  40 mg Intravenous Q12H Doutova, Anastassia, MD   40 mg at 09/24/23 1030   potassium chloride SA (KLOR-CON M) CR tablet 40 mEq  40 mEq Oral Q4H Pahwani, Ravi, MD   40 mEq at 09/24/23 1029   rosuvastatin (CRESTOR) tablet 20 mg  20 mg Oral Daily Doutova, Anastassia, MD   20 mg at 09/24/23 1030   Allergies as of 09/22/2023 - Review Complete 09/22/2023  Allergen Reaction Noted   Cardizem [diltiazem] Rash 10/27/2022  Glucophage [metformin] Other (See Comments)    Review of Systems:  Review of Systems  Respiratory:  Negative for shortness of breath.   Cardiovascular:  Negative for chest pain.  Gastrointestinal:  Negative for abdominal pain, constipation and diarrhea.    OBJECTIVE:   Temp:  [97.3 F (36.3 C)-98.1 F (36.7 C)] 97.7 F (36.5 C) (01/31 1117) Pulse Rate:  [54-79] 61 (01/31 1134) Resp:  [14-22] 16 (01/31 1134) BP: (106-151)/(47-121) 121/77 (01/31 1117) SpO2:  [87 %-100 %] 98 % (01/31 1134) FiO2 (%):  [32 %] 32 % (01/30 2311) Weight:  [85.9 kg] 85.9 kg (01/31 0257) Last BM Date : 09/23/23 Physical Exam Constitutional:      General: She is not in acute distress.    Appearance: She is not ill-appearing,  toxic-appearing or diaphoretic.  Cardiovascular:     Rate and Rhythm: Normal rate and regular rhythm.  Pulmonary:     Effort: No respiratory distress.     Breath sounds: Normal breath sounds.     Comments: Supplemental oxygen nasal cannula Abdominal:     General: Bowel sounds are normal. There is no distension.     Palpations: Abdomen is soft.     Tenderness: There is no abdominal tenderness. There is no guarding.  Neurological:     Mental Status: She is alert.     Labs: Recent Labs    09/22/23 2032 09/23/23 0630 09/23/23 2235 09/24/23 0700  WBC 2.3* 3.2*  --  3.6*  HGB 8.7* 6.8* 7.8* 7.8*  HCT 34.2* 26.1* 28.0* 28.3*  PLT 67* 95*  --  94*   BMET Recent Labs    09/22/23 2032 09/23/23 0630 09/24/23 0700  NA 145 148* 146*  K 3.2* 3.6 3.3*  CL 94* 96* 93*  CO2 39* 44* 45*  GLUCOSE 74 85 90  BUN 22 20 18   CREATININE 1.04* 1.03* 0.98  CALCIUM 9.7 9.5 9.6   LFT Recent Labs    09/23/23 0630  PROT 6.3*  ALBUMIN 2.9*  AST 21  ALT 13  ALKPHOS 98  BILITOT 0.5   PT/INR No results for input(s): "LABPROT", "INR" in the last 72 hours. Diagnostic imaging: ECHOCARDIOGRAM COMPLETE Result Date: 09/23/2023    ECHOCARDIOGRAM REPORT   Patient Name:   Erika Gates Date of Exam: 09/23/2023 Medical Rec #:  [id number]        Height:       60.0 in Accession #:    [id number]       Weight:       206.3 lb Date of Birth:  [birth year]        BSA:          1.892 m Patient Age:    88 years         BP:           129/54 mmHg Patient Gender: F                HR:           58 bpm. Exam Location:  Inpatient Procedure: 2D Echo, Cardiac Doppler and Color Doppler Indications:    CHF-Acute Diastolic I50.31  History:        Patient has prior history of Echocardiogram examinations, most                 recent 04/09/2023. CAD, Prior CABG; Risk Factors:Diabetes and                 Hypertension.  Sonographer:  Darlys Gales Referring Phys: Therisa Doyne IMPRESSIONS  1. Left ventricular ejection  fraction, by estimation, is 60 to 65%. The left ventricle has normal function. The left ventricle has no regional wall motion abnormalities. The left ventricular internal cavity size was mildly dilated. Left ventricular diastolic parameters are indeterminate. Elevated left ventricular end-diastolic pressure.  2. Right ventricular systolic function is moderately reduced. The right ventricular size is severely enlarged.  3. Right atrial size was mildly dilated.  4. The mitral valve is degenerative. Moderate mitral valve regurgitation. No evidence of mitral stenosis.  5. The aortic valve is tricuspid. Aortic valve regurgitation is not visualized. Aortic valve sclerosis/calcification is present, without any evidence of aortic stenosis. Aortic valve area, by VTI measures 2.18 cm. Aortic valve mean gradient measures 5.0 mmHg. Aortic valve Vmax measures 1.48 m/s. FINDINGS  Left Ventricle: Left ventricular ejection fraction, by estimation, is 60 to 65%. The left ventricle has normal function. The left ventricle has no regional wall motion abnormalities. The left ventricular internal cavity size was mildly dilated. There is  no left ventricular hypertrophy. Left ventricular diastolic parameters are indeterminate. Elevated left ventricular end-diastolic pressure. Right Ventricle: The right ventricular size is severely enlarged. No increase in right ventricular wall thickness. Right ventricular systolic function is moderately reduced. Left Atrium: Left atrial size was normal in size. Right Atrium: Right atrial size was mildly dilated. Pericardium: There is no evidence of pericardial effusion. Mitral Valve: The mitral valve is degenerative in appearance. There is mild calcification of the mitral valve leaflet(s). Moderate mitral valve regurgitation. No evidence of mitral valve stenosis. Tricuspid Valve: The tricuspid valve is normal in structure. Tricuspid valve regurgitation is mild . No evidence of tricuspid stenosis. Aortic  Valve: The aortic valve is tricuspid. Aortic valve regurgitation is not visualized. Aortic valve sclerosis/calcification is present, without any evidence of aortic stenosis. Aortic valve mean gradient measures 5.0 mmHg. Aortic valve peak gradient measures 8.8 mmHg. Aortic valve area, by VTI measures 2.18 cm. Pulmonic Valve: The pulmonic valve was normal in structure. Pulmonic valve regurgitation is trivial. No evidence of pulmonic stenosis. Aorta: The aortic root is normal in size and structure. Venous: The inferior vena cava was not well visualized. IAS/Shunts: No atrial level shunt detected by color flow Doppler.  LEFT VENTRICLE PLAX 2D LVIDd:         5.40 cm   Diastology LVIDs:         3.00 cm   LV e' medial:    5.55 cm/s LV PW:         0.90 cm   LV E/e' medial:  22.7 LV IVS:        0.70 cm   LV e' lateral:   7.51 cm/s LVOT diam:     1.80 cm   LV E/e' lateral: 16.8 LV SV:         78 LV SV Index:   41 LVOT Area:     2.54 cm  RIGHT VENTRICLE RV Basal diam:  4.90 cm RV Mid diam:    4.20 cm RV S prime:     8.05 cm/s TAPSE (M-mode): 1.5 cm LEFT ATRIUM             Index        RIGHT ATRIUM           Index LA Vol (A2C):   73.3 ml 38.75 ml/m  RA Area:     19.40 cm LA Vol (A4C):   49.5 ml 26.17 ml/m  RA Volume:  58.80 ml  31.09 ml/m LA Biplane Vol: 61.9 ml 32.72 ml/m  AORTIC VALVE AV Area (Vmax):    1.94 cm AV Area (Vmean):   2.13 cm AV Area (VTI):     2.18 cm AV Vmax:           148.00 cm/s AV Vmean:          99.100 cm/s AV VTI:            0.358 m AV Peak Grad:      8.8 mmHg AV Mean Grad:      5.0 mmHg LVOT Vmax:         113.00 cm/s LVOT Vmean:        82.900 cm/s LVOT VTI:          0.306 m LVOT/AV VTI ratio: 0.85  AORTA Ao Root diam: 2.90 cm MITRAL VALVE                  TRICUSPID VALVE MV Area (PHT): 3.46 cm       TR Peak grad:   55.4 mmHg MV Decel Time: 219 msec       TR Vmax:        372.00 cm/s MR Peak grad:    122.3 mmHg MR Mean grad:    90.0 mmHg    SHUNTS MR Vmax:         553.00 cm/s  Systemic VTI:  0.31  m MR Vmean:        458.0 cm/s   Systemic Diam: 1.80 cm MR PISA:         6.28 cm MR PISA Eff ROA: 43 mm MR PISA Radius:  1.00 cm MV E velocity: 126.00 cm/s MV A velocity: 125.00 cm/s MV E/A ratio:  1.01 Armanda Magic MD Electronically signed by Armanda Magic MD Signature Date/Time: 09/23/2023/10:08:55 AM    Final    DG Chest 2 View Result Date: 09/22/2023 CLINICAL DATA:  Shortness of breath and weakness for 5 days EXAM: CHEST - 2 VIEW COMPARISON:  X-ray 04/17/2023 and older FINDINGS: Status post median sternotomy. Enlarged cardiopericardial silhouette calcified aorta. Vascular congestion. Elevation of the right hemidiaphragm. Small pleural effusions. Left midlung atelectasis. Film is under penetrated. Degenerative changes noted along the spine and shoulders. Fixation hardware seen along the lower cervical spine at the edge of the imaging field. IMPRESSION: Postop chest with enlarged heart and vascular congestion. Small effusions. Right greater than left. Under penetrated radiograph Electronically Signed   By: Karen Kays M.D.   On: 09/22/2023 17:08   IMPRESSION: Symptomatic anemia History peptic ulcer disease  Paroxysmal atrial fibrillation, on Eliquis prior to admission (last dose 09/21/23) Constipation  Coronary artery disease Diabetes mellitus  PLAN: -Plan is for EGD tomorrow -Continue to hold Eliquis  -Trend H/H, transfuse for Hgb < 7  -Ok for diet today, NPO at midnight for procedure tomorrow -Further recommendations to follow pending procedure   LOS: 2 days   Liliane Shi, DO [facility]

## 2023-09-24 NOTE — TOC Initial Note (Signed)
Transition of Care [facility]) - Initial/Assessment Note    Patient Details  Name: Erika Gates MRN: [id number] Date of Birth: [birth year]  Transition of [facility]) CM/SW Contact:    Harriet Masson, RN Phone Number: 09/24/2023, 3:30 PM  Clinical Narrative:                 Spoke to patient regarding transition needs.  Patient lives with her daughter and has home 23 from [employer]. Patient has used [facility] for home health. Patient reqeusted this RNCM speak to her daughter, [name], regarding home health.  [facility] request a decision be made on Monday about home health.  Address, Phone number and PCP verified. TOC following.    Expected Discharge Plan: Home w Home Health Services Barriers to Discharge: Continued Medical Work up   Patient Goals and CMS Choice Patient states their goals for this hospitalization and ongoing recovery are:: return home          Expected Discharge Plan and Services   Discharge Planning Services: CM Consult Post Acute Care Choice: Home Health Living arrangements for the past 2 months: Single Family Home                                      Prior Living Arrangements/Services Living arrangements for the past 2 months: Single Family Home Lives with:: Adult Children (daughter) Patient language and need for interpreter reviewed:: Yes Do you feel safe going back to the place where you live?: Yes      Need for Family Participation in Patient Care: Yes (Comment) Care giver support system in place?: Yes (comment) Current home services: DME (home 02, youth walker,) Criminal Activity/Legal Involvement Pertinent to Current Situation/Hospitalization: No - Comment as needed  Activities of Daily Living   ADL Screening (condition at time of admission) Independently performs ADLs?: Yes (appropriate for developmental age) Is the patient deaf or have difficulty hearing?: No Does the patient have difficulty seeing, even when wearing glasses/contacts?: No Does  the patient have difficulty concentrating, remembering, or making decisions?: No  Permission Sought/Granted                  Emotional Assessment Appearance:: Appears stated age Attitude/Demeanor/Rapport: Engaged Affect (typically observed): Accepting Orientation: : Oriented to Self, Oriented to Place, Oriented to  Time, Oriented to Situation Alcohol / Substance Use: Not Applicable Psych Involvement: No (comment)  Admission diagnosis:  Acute on chronic diastolic CHF (congestive heart failure) (HCC) [I50.33] Patient Active Problem List   Diagnosis Date Noted   Acute on chronic diastolic CHF (congestive heart failure) (HCC) 09/22/2023   CKD (chronic kidney disease) stage 3, GFR 30-59 ml/min (HCC) 09/22/2023   DM (diabetes mellitus) type II controlled with renal manifestation (HCC) 09/22/2023   Hypoglycemia 09/22/2023   Symptomatic anemia 09/22/2023   Acute respiratory failure with hypoxia and hypercapnia (HCC) 04/09/2023   Diastolic congestive heart failure (HCC) 04/09/2023   Acute blood loss anemia 04/09/2023   GI bleed 04/09/2023   Sinus bradycardia 04/09/2023   Transient hypotension 04/09/2023   SIRS (systemic inflammatory response syndrome) (HCC) 04/09/2023   Abnormal urinalysis 04/09/2023   Pancytopenia (HCC) 04/09/2023   Paroxysmal atrial fibrillation (HCC) 04/09/2023   Acute metabolic encephalopathy 04/09/2023   Renal insufficiency 04/09/2023   GERD (gastroesophageal reflux disease) 04/09/2023   Cervical stenosis of spinal canal 08/13/2022   Weakness 08/10/2022   Upper extremity weakness 08/10/2022   Weakness of  left lower extremity 08/10/2022   Anxiety 08/10/2022   Acute respiratory failure with hypoxia and hypercarbia (HCC) 07/14/2021   DDD (degenerative disc disease), lumbar with facet arthropathy and Spinal stenosis 03/12/2017   Primary osteoarthritis of both knees 03/12/2017   History of coronary artery disease s/p CABG  03/12/2017   Gout 12/16/2012   MORBID  OBESITY 06/12/2009   VITAMIN D DEFICIENCY 01/06/2009   VENOUS INSUFFICIENCY 01/15/2008   Hyperlipidemia with target LDL less than 70 09/15/2007   ALLERGIC RHINITIS 09/15/2007   HYPERCHOLESTEROLEMIA 08/08/2007   ANXIETY 08/08/2007   Essential hypertension 08/08/2007   Coronary atherosclerosis 08/08/2007   DEGENERATIVE JOINT DISEASE 08/08/2007   LOW BACK PAIN, CHRONIC 08/08/2007   ANEMIA, HX OF 08/08/2007   PCP:  [name], MD Pharmacy:   [facility] [phone] - [place], [place] - [address] AT [facility] [address] [place] [address] Phone: [phone] Fax: [phone]     Social Drivers of Health (SDOH) Social History: SDOH Screenings   Food Insecurity: No Food Insecurity (09/23/2023)  Housing: Low Risk  (09/23/2023)  Transportation Needs: No Transportation Needs (09/23/2023)  Utilities: Not At Risk (09/23/2023)  Financial Resource Strain: Low Risk  (08/27/2022)   [facility] from [facility]  Social Connections: Moderately Isolated (09/23/2023)  Tobacco Use: Low Risk  (09/23/2023)  Health Literacy: Adequate Health Literacy (08/27/2022)   Received from [facility], [facility]   SDOH Interventions:     Readmission Risk Interventions     No data to display

## 2023-09-24 NOTE — Evaluation (Signed)
Occupational Therapy Evaluation Patient Details Name: Erika Gates MRN: [id number] DOB: [birth year] Today's Date: 09/24/2023   History of Present Illness Pt is 89 year old presented to [facility] on  09/22/23 for acute on chronic chf and acute on chronic respiratory failure. Pt also with symptomatic anemia and GI consult with EGD planned for 2/1. PMH - Htn, afibCAD s/p CABG, diastolic CHF, neurologic myopathy with quadriparesis requiring cervical decompression 07/2022, DM, and morbid obesity   Clinical Impression   At baseline, pt completes ADLs Independent to Min assist and performs functional mobility household distances with a RW with assist. At baseline, pt receives assistance from her daughter for IADLs. Pt now presents with decreased activity tolerance, generalized B UE weakness, decreased balance, and decreased safety and independence with functional tasks. Pt currently demonstrates ability to complete UB ADLs with Set up to Mod assist, LB ADLs with Max assist +2, and functional step-pivot transfers with a RW with Min assist +2 to Mod assist +2. Pt's VSS on 3L continuous O2 through nasal cannula throughout session. Pt participated well and is motivated to return to PLOF. Pt will benefit from acute skilled OT services to address deficits outlined below, decrease caregiver burden, and increase safety and independence with functional tasks. Post acute discharge, pt will benefit from continued skilled OT services in the home to maximize rehab potential.       If plan is discharge home, recommend the following: Two people to help with walking and/or transfers;Two people to help with bathing/dressing/bathroom;Assistance with cooking/housework;Assist for transportation;Help with stairs or ramp for entrance (Set up for self feeding)    Functional Status Assessment  Patient has had a recent decline in their functional status and demonstrates the ability to make significant improvements in function in a  reasonable and predictable amount of time.  Equipment Recommendations  None recommended by OT (Pt already has needed equipment)    Recommendations for Other Services       Precautions / Restrictions Precautions Precautions: Fall Restrictions Weight Bearing Restrictions Per Provider Order: No      Mobility Bed Mobility Overal bed mobility: Needs Assistance Bed Mobility: Supine to Sit     Supine to sit: +2 for physical assistance, Min assist, Mod assist     General bed mobility comments: +2 min assist to bring legs off of bed and elevate trunk into sitting. +2 mod assist to use bed pad to pull hips to EOB    Transfers Overall transfer level: Needs assistance Equipment used: Rolling walker (2 wheels) Transfers: Sit to/from Stand, Bed to chair/wheelchair/BSC Sit to Stand: +2 physical assistance, Min assist, Mod assist     Step pivot transfers: +2 physical assistance, Min assist     General transfer comment: Assist to bring hips up and block feet from sliding forward. Once up pt able to take pivotal steps from bed to [facility].      Balance Overall balance assessment: Needs assistance Sitting-balance support: No upper extremity supported, Feet supported Sitting balance-Leahy Scale: Fair     Standing balance support: Bilateral upper extremity supported, During functional activity, Reliant on assistive device for balance Standing balance-Leahy Scale: Poor Standing balance comment: RW and min assist for static standing                           ADL either performed or assessed with clinical judgement   ADL Overall ADL's : Needs assistance/impaired Eating/Feeding: Set up;Sitting   Grooming: Minimal assistance;Sitting  Upper Body Bathing: Minimal assistance;Moderate assistance;Cueing for compensatory techniques;Sitting (with increased time)   Lower Body Bathing: Maximal assistance;+2 for physical assistance;+2 for safety/equipment;Sit to/from stand;Cueing for  compensatory techniques (with increased time)   Upper Body Dressing : Minimal assistance;Contact guard assist;Sitting (with increased time)   Lower Body Dressing: Maximal assistance;+2 for physical assistance;+2 for safety/equipment;Sit to/from stand (with increased time)   Toilet Transfer: Minimal assistance;Moderate assistance;+2 for physical assistance;+2 for safety/equipment;Rolling walker (2 wheels);BSC/3in1 (step-pivot; cues for hand placement/technique) Toilet Transfer Details (indicate cue type and reason): Mod assist +2 to power up from sitting then Min assist +2 for step-pivot transfers Toileting- Clothing Manipulation and Hygiene: Maximal assistance;+2 for physical assistance;+2 for safety/equipment;Sit to/from stand;Cueing for compensatory techniques (with increased time)         General ADL Comments: Pt with decreased activity tolerance, fatiguing quickly during funcitonal tasks.     Vision Baseline Vision/History: 1 Wears glasses (bifocals) Ability to See in Adequate Light: 0 Adequate (with glasses) Patient Visual Report: No change from baseline       Perception         Praxis         Pertinent Vitals/Pain Pain Assessment Pain Assessment: Faces Faces Pain Scale: Hurts little more Pain Location: bilateral knees Pain Descriptors / Indicators: Aching, Grimacing Pain Intervention(s): Limited activity within patient's tolerance, Monitored during session, Repositioned     Extremity/Trunk Assessment Upper Extremity Assessment Upper Extremity Assessment: Left hand dominant;RUE deficits/detail;LUE deficits/detail;Generalized weakness RUE Deficits / Details: impaired shoulder ROM at baseline; generalized weakness LUE Deficits / Details: impaired shoulder ROM at baseline; generalized weakness   Lower Extremity Assessment Lower Extremity Assessment: Defer to PT evaluation       Communication Communication Communication: No apparent difficulties   Cognition  Arousal: Alert Behavior During Therapy: WFL for tasks assessed/performed Overall Cognitive Status: Within Functional Limits for tasks assessed                                 General Comments: AAOx4 and pleasant throughout session. Able to follow 2 and 3-step commands consistently. Requires occasional mildly increased time for processing and motor planning.     General Comments  VSS on 3L continuous O2 through nasal cannula throughout session. RN present during a portion of session.    Exercises     Shoulder Instructions      Home Living Family/patient expects to be discharged to:: Private residence Living Arrangements: Children;Other relatives (daughter) Available Help at Discharge: Family;Available 24 hours/day Type of Home: House Home Access: Ramped entrance     Home Layout: One level     Bathroom Shower/Tub: Producer, television/film/video: Handicapped height Bathroom Accessibility: Yes How Accessible: Accessible via walker Home Equipment: Rollator (4 wheels);Cane - single point;BSC/3in1;Hand held Programmer, systems (2 wheels);Wheelchair - manual;Tub bench;Adaptive equipment;Other (comment) (adjustable bed (not hospital bed)) Adaptive Equipment: Reacher;Long-handled shoe horn        Prior Functioning/Environment Prior Level of Function : Needs assist             Mobility Comments: Pt reports she amb household distances witha RW with assist at home ADLs Comments: At baseline, pt completes UB ADLs Independent to CGA and LB ADLs with Min assist. Daughter assists pt with ADLs and assists pt with all IADLs.        OT Problem List: Decreased strength;Decreased activity tolerance;Impaired balance (sitting and/or standing)      OT Treatment/Interventions: Self-care/ADL  training;Therapeutic exercise;Energy conservation;DME and/or AE instruction;Therapeutic activities;Patient/family education;Balance training    OT Goals(Current goals can be  found in the care plan section) Acute Rehab OT Goals Patient Stated Goal: to feel better and return home with family OT Goal Formulation: With patient Time For Goal Achievement: 10/08/23 Potential to Achieve Goals: Good ADL Goals Pt Will Perform Upper Body Bathing: with contact guard assist;with adaptive equipment;sitting Pt Will Perform Lower Body Bathing: with min assist;with adaptive equipment;sitting/lateral leans;sit to/from stand Pt Will Perform Upper Body Dressing: with set-up;sitting Pt Will Perform Lower Body Dressing: with min assist;with adaptive equipment;sitting/lateral leans;sit to/from stand Pt Will Transfer to Toilet: with min assist;ambulating;bedside commode (with least restrictive AD) Pt Will Perform Toileting - Clothing Manipulation and hygiene: with min assist;sitting/lateral leans;sit to/from stand  OT Frequency: Min 1X/week    Co-evaluation PT/OT/SLP Co-Evaluation/Treatment: Yes Reason for Co-Treatment: For patient/therapist safety PT goals addressed during session: Mobility/safety with mobility OT goals addressed during session: ADL's and self-care      AM-PAC OT "6 Clicks" Daily Activity     Outcome Measure Help from another person eating meals?: A Little Help from another person taking care of personal grooming?: A Little Help from another person toileting, which includes using toliet, bedpan, or urinal?: A Lot Help from another person bathing (including washing, rinsing, drying)?: A Lot Help from another person to put on and taking off regular upper body clothing?: A Little Help from another person to put on and taking off regular lower body clothing?: A Lot 6 Click Score: 15   End of Session Equipment Utilized During Treatment: Gait belt;Rolling walker (2 wheels);Oxygen;Other (comment) [facility]) Nurse Communication: Mobility status  Activity Tolerance: Patient tolerated treatment well Patient left: in chair;with call bell/phone within reach;with chair alarm  set;with nursing/sitter in room  OT Visit Diagnosis: Unsteadiness on feet (R26.81);Muscle weakness (generalized) (M62.81);Other (comment) (decreased activity tolerance)                Time: 1610-9604 OT Time Calculation (min): 27 min Charges:  OT General Charges $OT Visit: 1 Visit OT Evaluation $OT Eval Moderate Complexity: 1 Mod OT Treatments $Self Care/Home Management : 8-22 mins  Rowyn Mustapha "Orson Eva., OTR/L, MA Acute Rehab [phone]   Lendon Colonel 09/24/2023, 5:35 PM

## 2023-09-24 NOTE — Progress Notes (Signed)
PROGRESS NOTE    Erika Gates  WUJ:[id number] DOB: Sep 14, 1934 DOA: 09/22/2023 PCP: [name], MD   Brief Narrative:  Erika Gates is a 89 y.o. female with medical history significant of HTN, HLD, DM-2, PAF on Eliquis, CAD s/p CABG 1996, chronic HFpEF, PUD,  Sinus bradycardia, Pancytopenia, CKD 3a Presented with  fatigue increased SOB for the past 5 days. Primary care provider did some blood work a day prior to presentation that showed hemoglobin 7.4 and 91 platelets. Blood pressure 124/60 satting 93% on home 3 L. Patient still endorsing shortness of breath has had problems with anemia in the past has been using Eliquis. Reports no black stools no blood in stools.  Last EGD was done August 2024 which showed erosive gastropathy and nonbleeding duodenal ulcer.  Also, during prior admission, she had bradycardia with rates in 30s and TSH was borderline suggestive of sick euthyroid syndrome.  Since heart rate rebounded, beta-blocker was resumed.  Assessment & Plan:   Principal Problem:   Acute on chronic diastolic CHF (congestive heart failure) (HCC) Active Problems:   Acute respiratory failure with hypoxia and hypercapnia (HCC)   Sinus bradycardia   Pancytopenia (HCC)   Paroxysmal atrial fibrillation (HCC)   HYPERCHOLESTEROLEMIA   Essential hypertension   History of coronary artery disease s/p CABG    CKD (chronic kidney disease) stage 3, GFR 30-59 ml/min (HCC)   DM (diabetes mellitus) type II controlled with renal manifestation (HCC)   Hypoglycemia   Symptomatic anemia  Acute on chronic hypoxic respiratory failure, POA: Multifactorial, could be due to anemia as well as acute on chronic congestive heart failure with preserved ejection fraction: Uses 3 L of oxygen at baseline, required BiPAP upon arrival, has been weaned and currently on 6 L but saturating 99%, advised primary RN to wean her down and maintain oxygen saturation between 92-95%.  Incentive spirometry ordered.  Continue  treatment as below.  Acute on chronic congestive heart failure with preserved ejection fraction, POA: Chest x-ray shows vascular congestion, BNP elevated but appears to be at baseline.  Had crackles upon arrival.  Recent echo done in August shows 70 to 75% ejection fraction with grade 2 diastolic dysfunction.  Appears to be taking Lasix 20 mg p.o. daily.  Started on Lasix 40 mg IV twice daily, net -4.2 L.  Continue current management with a strict I's and O's.  Symptomatic acute on chronic anemia: Baseline hemoglobin appears to be between 8-8.5, came in with 8.7 which has now dropped to 6.8 upon admission for which she received 1 unit of PRBC transfusion, hemoglobin around 8.  Source of anemia unknown.  GI bleeding was suspected, FOBT ordered, looks like patient had a bowel movement as well however stool was never collected.  RN has been requested to make sure to collect sample to send for FOBT.  Continue twice daily Protonix.  Monitor H&H every 12 hours.  Transfuse if less than 7.5 due to acute on chronic congestive heart failure.  Pancytopenia: Platelets and WBCs have remained low and they are at baseline.  No indication of transfusion for platelets at the moment since it is 95.  PAF/sinus bradycardia: In sinus rhythm but rate in bradycardia range.  Toprol-XL reduced to 25 mg.  TSH normal.  Hypertension: Blood pressure low normal.  Hold lisinopril.  Resume Toprol-XL.  Type 2 diabetes mellitus: Appears to be on Farxiga at home.  Hemoglobin A1c only 5.4.  Does not need SSI.  Hyperlipidemia: Continue Crestor.  CKD stage IIIa:  At baseline.  Hypernatremia: Improving and 146 today.  Monitor.  Hypokalemia: Low again today, will replenish.  DVT prophylaxis: SCDs Start: 09/23/23 0559 will avoid heparin   Code Status: Full Code  Family Communication: Daughter [name] present at bedside.  Plan of care discussed with patient in length and he/she verbalized understanding and agreed with it.  Status is:  Inpatient Remains inpatient appropriate because: Needs IV diuresis   Estimated body mass index is 36.98 kg/m as calculated from the following:   Height as of this encounter: 5' (1.524 m).   Weight as of this encounter: 85.9 kg.  Pressure Injury 04/18/23 Coccyx Medial Stage 2 -  Partial thickness loss of dermis presenting as a shallow open injury with a red, pink wound bed without slough. cracked skin (Active)  04/18/23 0411  Location: Coccyx  Location Orientation: Medial  Staging: Stage 2 -  Partial thickness loss of dermis presenting as a shallow open injury with a red, pink wound bed without slough.  Wound Description (Comments): cracked skin  Present on Admission: No     Pressure Injury 09/23/23 Sacrum Stage 3 -  Full thickness tissue loss. Subcutaneous fat may be visible but bone, tendon or muscle are NOT exposed. 2 cm x 1 cm x 0.1 cm 50% yellow 50% pink moist (Active)  09/23/23 0711  Location: Sacrum  Location Orientation:   Staging: Stage 3 -  Full thickness tissue loss. Subcutaneous fat may be visible but bone, tendon or muscle are NOT exposed.  Wound Description (Comments): 2 cm x 1 cm x 0.1 cm 50% yellow 50% pink moist  Present on Admission: Yes  Dressing Type Foam - Lift dressing to assess site every shift 09/23/23 2109   Nutritional Assessment: Body mass index is 36.98 kg/m.[name] Seen by dietician.  I agree with the assessment and plan as outlined below: Nutrition Status:        . Skin Assessment: I have examined the patient's skin and I agree with the wound assessment as performed by the wound care RN as outlined below: Pressure Injury 04/18/23 Coccyx Medial Stage 2 -  Partial thickness loss of dermis presenting as a shallow open injury with a red, pink wound bed without slough. cracked skin (Active)  04/18/23 0411  Location: Coccyx  Location Orientation: Medial  Staging: Stage 2 -  Partial thickness loss of dermis presenting as a shallow open injury with a red, pink  wound bed without slough.  Wound Description (Comments): cracked skin  Present on Admission: No     Pressure Injury 09/23/23 Sacrum Stage 3 -  Full thickness tissue loss. Subcutaneous fat may be visible but bone, tendon or muscle are NOT exposed. 2 cm x 1 cm x 0.1 cm 50% yellow 50% pink moist (Active)  09/23/23 0711  Location: Sacrum  Location Orientation:   Staging: Stage 3 -  Full thickness tissue loss. Subcutaneous fat may be visible but bone, tendon or muscle are NOT exposed.  Wound Description (Comments): 2 cm x 1 cm x 0.1 cm 50% yellow 50% pink moist  Present on Admission: Yes  Dressing Type Foam - Lift dressing to assess site every shift 09/23/23 2109    Consultants:  Cardiology  Procedures:  None  Antimicrobials:  Anti-infectives (From admission, onward)    None         Subjective: Patient seen and examined, daughter at the bedside.  Patient says that she is feeling better.  She denied any shortness of breath or any other complaint.  Objective: Vitals:   09/24/23 0539 09/24/23 0600 09/24/23 0700 09/24/23 0806  BP: (!) 151/70 107/83 (!) 144/58 (!) 146/69  Pulse: 76 73 73   Resp: 18 19 (!) 21   Temp: (!) 97.5 F (36.4 C)   (!) 97.5 F (36.4 C)  TempSrc: Oral   Oral  SpO2: 98% 91% (!) 89%   Weight:      Height:        Intake/Output Summary (Last 24 hours) at 09/24/2023 0854 Last data filed at 09/24/2023 0539 Gross per 24 hour  Intake 650 ml  Output 3000 ml  Net -2350 ml   Filed Weights   09/23/23 1122 09/24/23 0257  Weight: 86.3 kg 85.9 kg    Examination:  General exam: Appears calm and comfortable  Respiratory system: Faint crackles at bases. Respiratory effort normal. Cardiovascular system: S1 & S2 heard, RRR. No JVD, murmurs, rubs, gallops or clicks. No pedal edema. Gastrointestinal system: Abdomen is nondistended, soft and nontender. No organomegaly or masses felt. Normal bowel sounds heard. Central nervous system: Alert and oriented. No focal  neurological deficits. Extremities: Symmetric 5 x 5 power. Skin: No rashes, lesions or ulcers.  Psychiatry: Judgement and insight appear normal. Mood & affect appropriate.   Data Reviewed: I have personally reviewed following labs and imaging studies  CBC: Recent Labs  Lab 09/22/23 1040 09/22/23 1101 09/22/23 2032 09/23/23 0630 09/23/23 2235 09/24/23 0700  WBC 3.9*  --  2.3* 3.2*  --  3.6*  NEUTROABS 3.0  --  1.6*  --   --  2.7  HGB 7.4* 9.9* 8.7* 6.8* 7.8* 7.8*  HCT 28.7* 29.0* 34.2* 26.1* 28.0* 28.3*  MCV 90.5  --  91.2 90.6  --  88.7  PLT 102*  --  67* 95*  --  94*   Basic Metabolic Panel: Recent Labs  Lab 09/22/23 1040 09/22/23 1101 09/22/23 2032 09/23/23 0630 09/24/23 0700  NA 144 143 145 148* 146*  K 3.7 3.7 3.2* 3.6 3.3*  CL 96* 92* 94* 96* 93*  CO2 38*  --  39* 44* 45*  GLUCOSE 100* 102* 74 85 90  BUN 22 29* 22 20 18   CREATININE 1.26* 1.50* 1.04* 1.03* 0.98  CALCIUM 9.6  --  9.7 9.5 9.6  MG  --   --  2.6* 2.4  --   PHOS  --   --  3.4 3.1  --    GFR: Estimated Creatinine Clearance: 40.1 mL/min (by C-G formula based on SCr of 0.98 mg/dL). Liver Function Tests: Recent Labs  Lab 09/22/23 1040 09/23/23 0630  AST 28 21  ALT 14 13  ALKPHOS 102 98  BILITOT 0.6 0.5  PROT 6.5 6.3*  ALBUMIN 3.1* 2.9*   No results for input(s): "LIPASE", "AMYLASE" in the last 168 hours. No results for input(s): "AMMONIA" in the last 168 hours. Coagulation Profile: No results for input(s): "INR", "PROTIME" in the last 168 hours. Cardiac Enzymes: Recent Labs  Lab 09/22/23 2041  CKTOTAL 27*   BNP (last 3 results) No results for input(s): "PROBNP" in the last 8760 hours. HbA1C: Recent Labs    09/22/23 2100  HGBA1C 5.4   CBG: Recent Labs  Lab 09/23/23 0744 09/23/23 1038 09/23/23 1559 09/23/23 2110 09/24/23 0607  GLUCAP 87 80 116* 127* 83   Lipid Profile: No results for input(s): "CHOL", "HDL", "LDLCALC", "TRIG", "CHOLHDL", "LDLDIRECT" in the last 72  hours. Thyroid Function Tests: Recent Labs    09/22/23 2030 09/22/23 2032  TSH 4.321  --  FREET4  --  0.95   Anemia Panel: Recent Labs    09/22/23 2030 09/22/23 2032  VITAMINB12 1,413*  --   FOLATE 35.1  --   FERRITIN  --  14  TIBC  --  392  IRON  --  27*  RETICCTPCT 2.2  --    Sepsis Labs: Recent Labs  Lab 09/22/23 2032  PROCALCITON <0.10    Recent Results (from the past 240 hours)  Resp panel by RT-PCR (RSV, Flu A&B, Covid) Anterior Nasal Swab     Status: None   Collection Time: 09/22/23  8:51 PM   Specimen: Anterior Nasal Swab  Result Value Ref Range Status   SARS Coronavirus 2 by RT PCR NEGATIVE NEGATIVE Final   Influenza A by PCR NEGATIVE NEGATIVE Final   Influenza B by PCR NEGATIVE NEGATIVE Final    Comment: (NOTE) The Xpert Xpress SARS-CoV-2/FLU/RSV plus assay is intended as an aid in the diagnosis of influenza from Nasopharyngeal swab specimens and should not be used as a sole basis for treatment. Nasal washings and aspirates are unacceptable for Xpert Xpress SARS-CoV-2/FLU/RSV testing.  Fact Sheet for Patients: BloggerCourse.com  Fact Sheet for Healthcare Providers: SeriousBroker.it  This test is not yet approved or cleared by the [place] FDA and has been authorized for detection and/or diagnosis of SARS-CoV-2 by FDA under an Emergency Use Authorization (EUA). This EUA will remain in effect (meaning this test can be used) for the duration of the COVID-19 declaration under Section 564(b)(1) of the Act, 21 U.S.C. section 360bbb-3(b)(1), unless the authorization is terminated or revoked.     Resp Syncytial Virus by PCR NEGATIVE NEGATIVE Final    Comment: (NOTE) Fact Sheet for Patients: BloggerCourse.com  Fact Sheet for Healthcare Providers: SeriousBroker.it  This test is not yet approved or cleared by the [place] FDA and has been  authorized for detection and/or diagnosis of SARS-CoV-2 by FDA under an Emergency Use Authorization (EUA). This EUA will remain in effect (meaning this test can be used) for the duration of the COVID-19 declaration under Section 564(b)(1) of the Act, 21 U.S.C. section 360bbb-3(b)(1), unless the authorization is terminated or revoked.  Performed at [facility], [address] [place], [place] [address]   MRSA Next Gen by PCR, Nasal     Status: None   Collection Time: 09/23/23 11:33 AM   Specimen: Nasal Mucosa; Nasal Swab  Result Value Ref Range Status   MRSA by PCR Next Gen NOT DETECTED NOT DETECTED Final    Comment: (NOTE) The GeneXpert MRSA Assay (FDA approved for NASAL specimens only), is one component of a comprehensive MRSA colonization surveillance program. It is not intended to diagnose MRSA infection nor to guide or monitor treatment for MRSA infections. Test performance is not FDA approved in patients less than 59 years old. Performed at [facility], [address], [place], [place] [address]      Radiology Studies: ECHOCARDIOGRAM COMPLETE Result Date: 09/23/2023    ECHOCARDIOGRAM REPORT   Patient Name:   BALINDA HEACOCK Date of Exam: 09/23/2023 Medical Rec #:  [id number]        Height:       60.0 in Accession #:    [id number]       Weight:       206.3 lb Date of Birth:  [birth year]        BSA:          1.892 m Patient Age:    62  years         BP:           129/54 mmHg Patient Gender: F                HR:           58 bpm. Exam Location:  Inpatient Procedure: 2D Echo, Cardiac Doppler and Color Doppler Indications:    CHF-Acute Diastolic I50.31  History:        Patient has prior history of Echocardiogram examinations, most                 recent 04/09/2023. CAD, Prior CABG; Risk Factors:Diabetes and                 Hypertension.  Sonographer:    Darlys Gales Referring Phys: Therisa Doyne IMPRESSIONS  1. Left ventricular ejection fraction, by estimation, is 60 to  65%. The left ventricle has normal function. The left ventricle has no regional wall motion abnormalities. The left ventricular internal cavity size was mildly dilated. Left ventricular diastolic parameters are indeterminate. Elevated left ventricular end-diastolic pressure.  2. Right ventricular systolic function is moderately reduced. The right ventricular size is severely enlarged.  3. Right atrial size was mildly dilated.  4. The mitral valve is degenerative. Moderate mitral valve regurgitation. No evidence of mitral stenosis.  5. The aortic valve is tricuspid. Aortic valve regurgitation is not visualized. Aortic valve sclerosis/calcification is present, without any evidence of aortic stenosis. Aortic valve area, by VTI measures 2.18 cm. Aortic valve mean gradient measures 5.0 mmHg. Aortic valve Vmax measures 1.48 m/s. FINDINGS  Left Ventricle: Left ventricular ejection fraction, by estimation, is 60 to 65%. The left ventricle has normal function. The left ventricle has no regional wall motion abnormalities. The left ventricular internal cavity size was mildly dilated. There is  no left ventricular hypertrophy. Left ventricular diastolic parameters are indeterminate. Elevated left ventricular end-diastolic pressure. Right Ventricle: The right ventricular size is severely enlarged. No increase in right ventricular wall thickness. Right ventricular systolic function is moderately reduced. Left Atrium: Left atrial size was normal in size. Right Atrium: Right atrial size was mildly dilated. Pericardium: There is no evidence of pericardial effusion. Mitral Valve: The mitral valve is degenerative in appearance. There is mild calcification of the mitral valve leaflet(s). Moderate mitral valve regurgitation. No evidence of mitral valve stenosis. Tricuspid Valve: The tricuspid valve is normal in structure. Tricuspid valve regurgitation is mild . No evidence of tricuspid stenosis. Aortic Valve: The aortic valve is  tricuspid. Aortic valve regurgitation is not visualized. Aortic valve sclerosis/calcification is present, without any evidence of aortic stenosis. Aortic valve mean gradient measures 5.0 mmHg. Aortic valve peak gradient measures 8.8 mmHg. Aortic valve area, by VTI measures 2.18 cm. Pulmonic Valve: The pulmonic valve was normal in structure. Pulmonic valve regurgitation is trivial. No evidence of pulmonic stenosis. Aorta: The aortic root is normal in size and structure. Venous: The inferior vena cava was not well visualized. IAS/Shunts: No atrial level shunt detected by color flow Doppler.  LEFT VENTRICLE PLAX 2D LVIDd:         5.40 cm   Diastology LVIDs:         3.00 cm   LV e' medial:    5.55 cm/s LV PW:         0.90 cm   LV E/e' medial:  22.7 LV IVS:        0.70 cm   LV e' lateral:   7.51  cm/s LVOT diam:     1.80 cm   LV E/e' lateral: 16.8 LV SV:         78 LV SV Index:   41 LVOT Area:     2.54 cm  RIGHT VENTRICLE RV Basal diam:  4.90 cm RV Mid diam:    4.20 cm RV S prime:     8.05 cm/s TAPSE (M-mode): 1.5 cm LEFT ATRIUM             Index        RIGHT ATRIUM           Index LA Vol (A2C):   73.3 ml 38.75 ml/m  RA Area:     19.40 cm LA Vol (A4C):   49.5 ml 26.17 ml/m  RA Volume:   58.80 ml  31.09 ml/m LA Biplane Vol: 61.9 ml 32.72 ml/m  AORTIC VALVE AV Area (Vmax):    1.94 cm AV Area (Vmean):   2.13 cm AV Area (VTI):     2.18 cm AV Vmax:           148.00 cm/s AV Vmean:          99.100 cm/s AV VTI:            0.358 m AV Peak Grad:      8.8 mmHg AV Mean Grad:      5.0 mmHg LVOT Vmax:         113.00 cm/s LVOT Vmean:        82.900 cm/s LVOT VTI:          0.306 m LVOT/AV VTI ratio: 0.85  AORTA Ao Root diam: 2.90 cm MITRAL VALVE                  TRICUSPID VALVE MV Area (PHT): 3.46 cm       TR Peak grad:   55.4 mmHg MV Decel Time: 219 msec       TR Vmax:        372.00 cm/s MR Peak grad:    122.3 mmHg MR Mean grad:    90.0 mmHg    SHUNTS MR Vmax:         553.00 cm/s  Systemic VTI:  0.31 m MR Vmean:        458.0  cm/s   Systemic Diam: 1.80 cm MR PISA:         6.28 cm MR PISA Eff ROA: 43 mm MR PISA Radius:  1.00 cm MV E velocity: 126.00 cm/s MV A velocity: 125.00 cm/s MV E/A ratio:  1.01 Armanda Magic MD Electronically signed by Armanda Magic MD Signature Date/Time: 09/23/2023/10:08:55 AM    Final    DG Chest 2 View Result Date: 09/22/2023 CLINICAL DATA:  Shortness of breath and weakness for 5 days EXAM: CHEST - 2 VIEW COMPARISON:  X-ray 04/17/2023 and older FINDINGS: Status post median sternotomy. Enlarged cardiopericardial silhouette calcified aorta. Vascular congestion. Elevation of the right hemidiaphragm. Small pleural effusions. Left midlung atelectasis. Film is under penetrated. Degenerative changes noted along the spine and shoulders. Fixation hardware seen along the lower cervical spine at the edge of the imaging field. IMPRESSION: Postop chest with enlarged heart and vascular congestion. Small effusions. Right greater than left. Under penetrated radiograph Electronically Signed   By: Karen Kays M.D.   On: 09/22/2023 17:08    Scheduled Meds:  clonazePAM  0.5 mg Oral BID   furosemide  40 mg Intravenous BID   insulin aspart  0-15 Units Subcutaneous  TID WC   leptospermum manuka honey  1 Application Topical Daily   liver oil-zinc oxide   Topical BID   losartan  25 mg Oral Daily   metoprolol succinate  25 mg Oral Daily   pantoprazole (PROTONIX) IV  40 mg Intravenous Q12H   potassium chloride  40 mEq Oral Q4H   rosuvastatin  20 mg Oral Daily   Continuous Infusions:   LOS: 2 days   Hughie Closs, MD [facility]  09/24/2023, 8:54 AM   *Please note that this is a verbal dictation therefore any spelling or grammatical errors are due to the "Dragon Medical One" system interpretation.  Please page via Amion and do not message via secure chat for urgent patient care matters. Secure chat can be used for non urgent patient care matters.  How to contact the [facility] Attending or Consulting provider 7A  - 7P or covering provider during after hours 7P -7A, for this patient?  Check the care team in [facility] and look for a) attending/consulting [facility] provider listed and b) the [facility] team listed. Page or secure chat 7A-7P. Log into [email or web address] and use [place]'s universal password to access. If you do not have the password, please contact the hospital operator. Locate the [facility] provider you are looking for under [facility] and page to a number that you can be directly reached. If you still have difficulty reaching the provider, please page the [facility] (Director on Call) for the Hospitalists listed on amion for assistance.

## 2023-09-24 NOTE — Progress Notes (Signed)
Rounding Note    Patient Name: Erika Gates Date of Encounter: 09/24/2023   [facility] Cardiologist: Donato Schultz, MD   Subjective   She does not say much.  She slept well last night.  She is eating this morning Her O2 sat is up to 6 and she was on CPAP overnight  Inpatient Medications    Scheduled Meds:  clonazePAM  0.5 mg Oral BID   furosemide  40 mg Intravenous BID   insulin aspart  0-15 Units Subcutaneous TID WC   leptospermum manuka honey  1 Application Topical Daily   liver oil-zinc oxide   Topical BID   losartan  25 mg Oral Daily   metoprolol succinate  25 mg Oral Daily   pantoprazole (PROTONIX) IV  40 mg Intravenous Q12H   rosuvastatin  20 mg Oral Daily   Continuous Infusions:  PRN Meds: acetaminophen **OR** acetaminophen, docusate sodium, ondansetron **OR** ondansetron (ZOFRAN) IV   Vital Signs    Vitals:   09/24/23 0539 09/24/23 0600 09/24/23 0700 09/24/23 0806  BP: (!) 151/70 107/83 (!) 144/58 (!) 146/69  Pulse: 76 73 73   Resp: 18 19 (!) 21   Temp: (!) 97.5 F (36.4 C)   (!) 97.5 F (36.4 C)  TempSrc: Oral   Oral  SpO2: 98% 91% (!) 89%   Weight:      Height:        Intake/Output Summary (Last 24 hours) at 09/24/2023 0839 Last data filed at 09/24/2023 0539 Gross per 24 hour  Intake 650 ml  Output 3000 ml  Net -2350 ml      09/24/2023    2:57 AM 09/23/2023   11:22 AM 04/22/2023    4:21 AM  Last 3 Weights  Weight (lbs) 189 lb 6 oz 190 lb 4.1 oz 206 lb 5.6 oz  Weight (kg) 85.9 kg 86.3 kg 93.6 kg      Telemetry    Sinus rhythm- Personally Reviewed  ECG    No new- Personally Reviewed  Physical Exam   Vitals:   09/24/23 0700 09/24/23 0806  BP: (!) 144/58 (!) 146/69  Pulse: 73   Resp: (!) 21   Temp:  (!) 97.5 F (36.4 C)  SpO2: (!) 89%     GEN: No acute distress.   Neck: + JVD Cardiac: RRR, + holosystolic murmur Respiratory: Normal work of breathing, good air movement GI: Soft,  non-distended  MS: Positive lower  extremity edema bilaterally Neuro:  Nonfocal  Psych: Normal affect   Labs    High Sensitivity Troponin:   Recent Labs  Lab 09/22/23 1725 09/22/23 1843  TROPONINIHS 17 17     Chemistry Recent Labs  Lab 09/22/23 1040 09/22/23 1101 09/22/23 2032 09/23/23 0630 09/24/23 0700  NA 144   < > 145 148* 146*  K 3.7   < > 3.2* 3.6 3.3*  CL 96*   < > 94* 96* 93*  CO2 38*  --  39* 44* 45*  GLUCOSE 100*   < > 74 85 90  BUN 22   < > 22 20 18   CREATININE 1.26*   < > 1.04* 1.03* 0.98  CALCIUM 9.6  --  9.7 9.5 9.6  MG  --   --  2.6* 2.4  --   PROT 6.5  --   --  6.3*  --   ALBUMIN 3.1*  --   --  2.9*  --   AST 28  --   --  21  --  ALT 14  --   --  13  --   ALKPHOS 102  --   --  98  --   BILITOT 0.6  --   --  0.5  --   GFRNONAA 42*  --  52* 53* 56*  ANIONGAP 10  --  12 8 8    < > = values in this interval not displayed.    Lipids No results for input(s): "CHOL", "TRIG", "HDL", "LABVLDL", "LDLCALC", "CHOLHDL" in the last 168 hours.  Hematology Recent Labs  Lab 09/22/23 2032 09/23/23 0630 09/23/23 2235 09/24/23 0700  WBC 2.3* 3.2*  --  3.6*  RBC 3.75* 2.88*  --  3.19*  HGB 8.7* 6.8* 7.8* 7.8*  HCT 34.2* 26.1* 28.0* 28.3*  MCV 91.2 90.6  --  88.7  MCH 23.2* 23.6*  --  24.5*  MCHC 25.4* 26.1*  --  27.6*  RDW 21.2* 21.2*  --  20.4*  PLT 67* 95*  --  94*   Thyroid  Recent Labs  Lab 09/22/23 2030 09/22/23 2032  TSH 4.321  --   FREET4  --  0.95    BNP Recent Labs  Lab 09/22/23 1725  BNP 1,085.8*    DDimer No results for input(s): "DDIMER" in the last 168 hours.   Radiology    ECHOCARDIOGRAM COMPLETE Result Date: 09/23/2023    ECHOCARDIOGRAM REPORT   Patient Name:   KALYNN DECLERCQ Date of Exam: 09/23/2023 Medical Rec #:  [id number]        Height:       60.0 in Accession #:    [id number]       Weight:       206.3 lb Date of Birth:  1934/11/30        BSA:          1.892 m Patient Age:    88 years         BP:           129/54 mmHg Patient Gender: F                HR:            58 bpm. Exam Location:  Inpatient Procedure: 2D Echo, Cardiac Doppler and Color Doppler Indications:    CHF-Acute Diastolic I50.31  History:        Patient has prior history of Echocardiogram examinations, most                 recent 04/09/2023. CAD, Prior CABG; Risk Factors:Diabetes and                 Hypertension.  Sonographer:    Darlys Gales Referring Phys: Therisa Doyne IMPRESSIONS  1. Left ventricular ejection fraction, by estimation, is 60 to 65%. The left ventricle has normal function. The left ventricle has no regional wall motion abnormalities. The left ventricular internal cavity size was mildly dilated. Left ventricular diastolic parameters are indeterminate. Elevated left ventricular end-diastolic pressure.  2. Right ventricular systolic function is moderately reduced. The right ventricular size is severely enlarged.  3. Right atrial size was mildly dilated.  4. The mitral valve is degenerative. Moderate mitral valve regurgitation. No evidence of mitral stenosis.  5. The aortic valve is tricuspid. Aortic valve regurgitation is not visualized. Aortic valve sclerosis/calcification is present, without any evidence of aortic stenosis. Aortic valve area, by VTI measures 2.18 cm. Aortic valve mean gradient measures 5.0 mmHg. Aortic valve Vmax measures 1.48 m/s. FINDINGS  Left Ventricle: Left  ventricular ejection fraction, by estimation, is 60 to 65%. The left ventricle has normal function. The left ventricle has no regional wall motion abnormalities. The left ventricular internal cavity size was mildly dilated. There is  no left ventricular hypertrophy. Left ventricular diastolic parameters are indeterminate. Elevated left ventricular end-diastolic pressure. Right Ventricle: The right ventricular size is severely enlarged. No increase in right ventricular wall thickness. Right ventricular systolic function is moderately reduced. Left Atrium: Left atrial size was normal in size. Right Atrium: Right  atrial size was mildly dilated. Pericardium: There is no evidence of pericardial effusion. Mitral Valve: The mitral valve is degenerative in appearance. There is mild calcification of the mitral valve leaflet(s). Moderate mitral valve regurgitation. No evidence of mitral valve stenosis. Tricuspid Valve: The tricuspid valve is normal in structure. Tricuspid valve regurgitation is mild . No evidence of tricuspid stenosis. Aortic Valve: The aortic valve is tricuspid. Aortic valve regurgitation is not visualized. Aortic valve sclerosis/calcification is present, without any evidence of aortic stenosis. Aortic valve mean gradient measures 5.0 mmHg. Aortic valve peak gradient measures 8.8 mmHg. Aortic valve area, by VTI measures 2.18 cm. Pulmonic Valve: The pulmonic valve was normal in structure. Pulmonic valve regurgitation is trivial. No evidence of pulmonic stenosis. Aorta: The aortic root is normal in size and structure. Venous: The inferior vena cava was not well visualized. IAS/Shunts: No atrial level shunt detected by color flow Doppler.  LEFT VENTRICLE PLAX 2D LVIDd:         5.40 cm   Diastology LVIDs:         3.00 cm   LV e' medial:    5.55 cm/s LV PW:         0.90 cm   LV E/e' medial:  22.7 LV IVS:        0.70 cm   LV e' lateral:   7.51 cm/s LVOT diam:     1.80 cm   LV E/e' lateral: 16.8 LV SV:         78 LV SV Index:   41 LVOT Area:     2.54 cm  RIGHT VENTRICLE RV Basal diam:  4.90 cm RV Mid diam:    4.20 cm RV S prime:     8.05 cm/s TAPSE (M-mode): 1.5 cm LEFT ATRIUM             Index        RIGHT ATRIUM           Index LA Vol (A2C):   73.3 ml 38.75 ml/m  RA Area:     19.40 cm LA Vol (A4C):   49.5 ml 26.17 ml/m  RA Volume:   58.80 ml  31.09 ml/m LA Biplane Vol: 61.9 ml 32.72 ml/m  AORTIC VALVE AV Area (Vmax):    1.94 cm AV Area (Vmean):   2.13 cm AV Area (VTI):     2.18 cm AV Vmax:           148.00 cm/s AV Vmean:          99.100 cm/s AV VTI:            0.358 m AV Peak Grad:      8.8 mmHg AV Mean Grad:       5.0 mmHg LVOT Vmax:         113.00 cm/s LVOT Vmean:        82.900 cm/s LVOT VTI:          0.306 m LVOT/AV VTI ratio: 0.85  AORTA Ao Root diam: 2.90 cm MITRAL VALVE                  TRICUSPID VALVE MV Area (PHT): 3.46 cm       TR Peak grad:   55.4 mmHg MV Decel Time: 219 msec       TR Vmax:        372.00 cm/s MR Peak grad:    122.3 mmHg MR Mean grad:    90.0 mmHg    SHUNTS MR Vmax:         553.00 cm/s  Systemic VTI:  0.31 m MR Vmean:        458.0 cm/s   Systemic Diam: 1.80 cm MR PISA:         6.28 cm MR PISA Eff ROA: 43 mm MR PISA Radius:  1.00 cm MV E velocity: 126.00 cm/s MV A velocity: 125.00 cm/s MV E/A ratio:  1.01 Armanda Magic MD Electronically signed by Armanda Magic MD Signature Date/Time: 09/23/2023/10:08:55 AM    Final    DG Chest 2 View Result Date: 09/22/2023 CLINICAL DATA:  Shortness of breath and weakness for 5 days EXAM: CHEST - 2 VIEW COMPARISON:  X-ray 04/17/2023 and older FINDINGS: Status post median sternotomy. Enlarged cardiopericardial silhouette calcified aorta. Vascular congestion. Elevation of the right hemidiaphragm. Small pleural effusions. Left midlung atelectasis. Film is under penetrated. Degenerative changes noted along the spine and shoulders. Fixation hardware seen along the lower cervical spine at the edge of the imaging field. IMPRESSION: Postop chest with enlarged heart and vascular congestion. Small effusions. Right greater than left. Under penetrated radiograph Electronically Signed   By: Karen Kays M.D.   On: 09/22/2023 17:08    Cardiac Studies   TTE 09/23/2023 1. Left ventricular ejection fraction, by estimation, is 60 to 65%. The  left ventricle has normal function. The left ventricle has no regional  wall motion abnormalities. The left ventricular internal cavity size was  mildly dilated. Left ventricular  diastolic parameters are indeterminate. Elevated left ventricular  end-diastolic pressure.   2. Right ventricular systolic function is moderately  reduced. The right  ventricular size is severely enlarged.   3. Right atrial size was mildly dilated.   4. The mitral valve is degenerative. Moderate mitral valve regurgitation.  No evidence of mitral stenosis.   5. The aortic valve is tricuspid. Aortic valve regurgitation is not  visualized. Aortic valve sclerosis/calcification is present, without any  evidence of aortic stenosis. Aortic valve area, by VTI measures 2.18 cm.  Aortic valve mean gradient measures  5.0 mmHg. Aortic valve Vmax measures 1.48 m/s.   Patient Profile     Mrs. Sobecki is a 52-year-old female with multiple comorbidities including prior PEA arrest post cervical decompression,  COPD on home 3 L, CKD stage IIIa, ischemic heart disease with a CABG in 1996, chronic diastolic heart failure, hypertension, diabetes type 2, persistent atrial fibrillation , who came to the emergency room with weakness and fatigue and found to have significant anemia as well as decompensated heart failure.   Assessment & Plan    Decompensated diastolic heart failure Moderate MR; degenerative valve -Renal function is improving; still remains volume up -Continue IV diuresis -For her MR she is not a surgical candidate if this progresses; discussed with the daughter that this is best managed medically  Persistent atrial fibrillation -In sinus rhythm -Not on anticoagulation presenting with significant anemia  Anemia Has hx of erosive gastropathy/duodenal ulcer No acute GI bleed Plan for  IV PPI per GI; blood transfusions as needed.  Plan for EGD on 2/1  CAD/Hx of CABG Stable Continue Crestor 20 mg daily Continue beta-blocker  HTN started losartan 25 mg daily; continue.  Can stop home lisinopril On BB  COPD On 3 L at home   Time Spent Directly with Patient:  I have spent a total of 35 minutes with the patient reviewing hospital notes, telemetry, EKGs, labs and examining the patient as well as establishing an assessment and plan  that was discussed personally with the patient.     For questions or updates, please contact [facility] Please consult [email or web address] for contact info under        Signed, Maisie Fus, MD  09/24/2023, 8:39 AM

## 2023-09-24 NOTE — Evaluation (Signed)
Physical Therapy Evaluation Patient Details Name: Erika Gates MRN: [id number] DOB: 05-20-1935 Today's Date: 09/24/2023  History of Present Illness  Pt is 89 year old presented to [facility] on  09/22/23 for acute on chronic chf and acute on chronic respiratory failure. Pt also with symptomatic anemia and GI consult with EGD planned for 2/1. PMH - Htn, afibCAD s/p CABG, diastolic CHF, neurologic myopathy with quadriparesis requiring cervical decompression 07/2022, DM, and morbid obesity  Clinical Impression  Pt admitted with above diagnosis and presents to PT with functional limitations due to deficits listed below (See PT problem list). Pt needs skilled PT to maximize independence and safety. Prior to admission pt amb short household distances with walker and assistance of family. Pt had SNF stay after hospitalization in August and has since returned home with support of family. Likely home environment will allow pt better mobility than here in the hospital. Family very supportive. Recommend return home with HHPT.           If plan is discharge home, recommend the following: A little help with walking and/or transfers;A little help with bathing/dressing/bathroom;Assist for transportation;Help with stairs or ramp for entrance   Can travel by private vehicle        Equipment Recommendations None recommended by PT  Recommendations for Other Services       Functional Status Assessment Patient has had a recent decline in their functional status and demonstrates the ability to make significant improvements in function in a reasonable and predictable amount of time.     Precautions / Restrictions Precautions Precautions: Fall      Mobility  Bed Mobility Overal bed mobility: Needs Assistance Bed Mobility: Supine to Sit     Supine to sit: +2 for physical assistance, Min assist, Mod assist     General bed mobility comments: +2 min assist to bring legs off of bed and elevate trunk into  sitting. +2 mod assist to use bed pad to pull hips to EOB    Transfers Overall transfer level: Needs assistance Equipment used: Rolling walker (2 wheels) Transfers: Sit to/from Stand, Bed to chair/wheelchair/BSC Sit to Stand: +2 physical assistance, Min assist, Mod assist   Step pivot transfers: +2 physical assistance, Min assist       General transfer comment: Assist to bring hips up and block feet from sliding forward. Once up pt able to take pivotal steps from bed to [facility].    Ambulation/Gait Ambulation/Gait assistance: +2 physical assistance, Min assist Gait Distance (Feet): 3 Feet Assistive device: Rolling walker (2 wheels) Gait Pattern/deviations: Step-through pattern, Decreased step length - right, Decreased step length - left, Shuffle, Trunk flexed Gait velocity: decr Gait velocity interpretation: <1.31 ft/sec, indicative of household ambulator   General Gait Details: Assist for balance and support  Stairs            Wheelchair Mobility     Tilt Bed    Modified Rankin (Stroke Patients Only)       Balance Overall balance assessment: Needs assistance Sitting-balance support: No upper extremity supported, Feet supported Sitting balance-Leahy Scale: Fair     Standing balance support: Bilateral upper extremity supported, During functional activity, Reliant on assistive device for balance Standing balance-Leahy Scale: Poor Standing balance comment: walker and min assist for static standing                             Pertinent Vitals/Pain Pain Assessment Pain Assessment: Faces Faces Pain  Scale: Hurts little more Pain Location: bilateral knees Pain Descriptors / Indicators: Aching, Grimacing Pain Intervention(s): Limited activity within patient's tolerance, Monitored during session    Home Living Family/patient expects to be discharged to:: Private residence Living Arrangements: Children;Other relatives (daughter during the day and grandson  at night) Available Help at Discharge: Family;Available 24 hours/day Type of Home: House Home Access: Ramped entrance       Home Layout: One level Home Equipment: Rollator (4 wheels);Cane - single point;BSC/3in1;Hand held Programmer, systems (2 wheels);Wheelchair - manual;Tub bench;Adaptive equipment;Other (comment) (adjustable bed not hospital bed)      Prior Function Prior Level of Function : Needs assist       Physical Assist : Mobility (physical);ADLs (physical) Mobility (physical): Bed mobility;Transfers;Gait ADLs (physical): Bathing;Dressing;Toileting Mobility Comments: Pt reports she amb household distances with assist at home       Extremity/Trunk Assessment   Upper Extremity Assessment Upper Extremity Assessment: Defer to OT evaluation    Lower Extremity Assessment Lower Extremity Assessment: Generalized weakness;RLE deficits/detail;LLE deficits/detail RLE Deficits / Details: knees with severe arthritis LLE Deficits / Details: knees with severe arthritis       Communication   Communication Communication: No apparent difficulties  Cognition Arousal: Alert Behavior During Therapy: WFL for tasks assessed/performed Overall Cognitive Status: Within Functional Limits for tasks assessed                                          General Comments General comments (skin integrity, edema, etc.): VSS on 3L O2    Exercises     Assessment/Plan    PT Assessment Patient needs continued PT services  PT Problem List Decreased strength;Decreased activity tolerance;Decreased balance;Decreased mobility;Obesity       PT Treatment Interventions DME instruction;Gait training;Functional mobility training;Therapeutic activities;Therapeutic exercise;Balance training;Patient/family education    PT Goals (Current goals can be found in the Care Plan section)  Acute Rehab PT Goals Patient Stated Goal: go home PT Goal Formulation: With patient Time For  Goal Achievement: 10/08/23 Potential to Achieve Goals: Good    Frequency Min 1X/week     Co-evaluation PT/OT/SLP Co-Evaluation/Treatment: Yes Reason for Co-Treatment: For patient/therapist safety PT goals addressed during session: Mobility/safety with mobility         AM-PAC PT "6 Clicks" Mobility  Outcome Measure Help needed turning from your back to your side while in a flat bed without using bedrails?: Total Help needed moving from lying on your back to sitting on the side of a flat bed without using bedrails?: Total Help needed moving to and from a bed to a chair (including a wheelchair)?: Total Help needed standing up from a chair using your arms (e.g., wheelchair or bedside chair)?: Total Help needed to walk in hospital room?: Total Help needed climbing 3-5 steps with a railing? : Total 6 Click Score: 6    End of Session Equipment Utilized During Treatment: Oxygen Activity Tolerance: Patient tolerated treatment well Patient left: in chair;with call bell/phone within reach;with chair alarm set;with nursing/sitter in room Nurse Communication: Mobility status PT Visit Diagnosis: Unsteadiness on feet (R26.81);Other abnormalities of gait and mobility (R26.89);Muscle weakness (generalized) (M62.81)    Time: 1610-9604 PT Time Calculation (min) (ACUTE ONLY): 25 min   Charges:   PT Evaluation $PT Eval Moderate Complexity: 1 Mod   PT General Charges $$ ACUTE PT VISIT: 1 Visit  [facility] PT Acute Rehabilitation Services Office [phone]   Angelina Ok [facility] 09/24/2023, 1:44 PM

## 2023-09-24 NOTE — H&P (View-Only) (Signed)
[facility] Progress Note  SUBJECTIVE:   Interval history: Erika Gates was seen and evaluated today at bedside. Resting comfortably. Denied bowel movement today. No abdominal pain. Tolerated diet today. No chest pain or shortness of breath. Noted having knee pain. Agreeable to procedure tomorrow.   Past Medical History:  Diagnosis Date   Allergic rhinitis    Anemia    Anxiety    Coronary atherosclerosis of unspecified type of vessel, native or graft    Diabetes mellitus    DJD (degenerative joint disease)    Hypercholesteremia    Hypertension    Low back pain    Morbid obesity (HCC)    Venous insufficiency    Vitamin D deficiency    Past Surgical History:  Procedure Laterality Date   ABDOMINAL HYSTERECTOMY     ANTERIOR CERVICAL DECOMP/DISCECTOMY FUSION N/A 08/14/2022   Procedure: Cerivcal Three-Four, Cerivcal Four-Five, Cerivcal Five-Six Anterior Cervical Discectomy Fusion;  Surgeon: Barnett Abu, MD;  Location: [facility] OR;  Service: Neurosurgery;  Laterality: N/A;  RM 19   CORONARY ARTERY BYPASS GRAFT     3 vessel- Dr. Laneta Simmers 8/96   ESOPHAGOGASTRODUODENOSCOPY (EGD) WITH PROPOFOL Left 04/13/2023   Procedure: ESOPHAGOGASTRODUODENOSCOPY (EGD) WITH PROPOFOL;  Surgeon: Willis Modena, MD;  Location: [facility] ENDOSCOPY;  Service: Gastroenterology;  Laterality: Left;   Current Facility-Administered Medications  Medication Dose Route Frequency Provider Last Rate Last Admin   acetaminophen (TYLENOL) tablet 650 mg  650 mg Oral Q6H PRN Therisa Doyne, MD       Or   acetaminophen (TYLENOL) suppository 650 mg  650 mg Rectal Q6H PRN Therisa Doyne, MD       clonazePAM (KLONOPIN) tablet 0.5 mg  0.5 mg Oral BID Doutova, Anastassia, MD   0.5 mg at 09/24/23 1029   docusate sodium (COLACE) capsule 100 mg  100 mg Oral Daily PRN Therisa Doyne, MD       furosemide (LASIX) injection 40 mg  40 mg Intravenous BID Hughie Closs, MD   40 mg at 09/24/23 0810   insulin aspart  (novoLOG) injection 0-15 Units  0-15 Units Subcutaneous TID WC Marcelino Duster, PA   2 Units at 09/24/23 1327   leptospermum manuka honey (MEDIHONEY) paste 1 Application  1 Application Topical Daily Hughie Closs, MD   1 Application at 09/24/23 1153   liver oil-zinc oxide (DESITIN) 40 % ointment   Topical BID Hughie Closs, MD   Given at 09/24/23 1153   losartan (COZAAR) tablet 25 mg  25 mg Oral Daily Cyndi Bender, NP   25 mg at 09/24/23 1029   metoprolol succinate (TOPROL-XL) 24 hr tablet 25 mg  25 mg Oral Daily Cyndi Bender, NP   25 mg at 09/24/23 1029   ondansetron (ZOFRAN) tablet 4 mg  4 mg Oral Q6H PRN Therisa Doyne, MD       Or   ondansetron (ZOFRAN) injection 4 mg  4 mg Intravenous Q6H PRN Doutova, Anastassia, MD       pantoprazole (PROTONIX) injection 40 mg  40 mg Intravenous Q12H Doutova, Anastassia, MD   40 mg at 09/24/23 1030   potassium chloride SA (KLOR-CON M) CR tablet 40 mEq  40 mEq Oral Q4H Pahwani, Ravi, MD   40 mEq at 09/24/23 1029   rosuvastatin (CRESTOR) tablet 20 mg  20 mg Oral Daily Doutova, Anastassia, MD   20 mg at 09/24/23 1030   Allergies as of 09/22/2023 - Review Complete 09/22/2023  Allergen Reaction Noted   Cardizem [diltiazem] Rash 10/27/2022  Glucophage [metformin] Other (See Comments)    Review of Systems:  Review of Systems  Respiratory:  Negative for shortness of breath.   Cardiovascular:  Negative for chest pain.  Gastrointestinal:  Negative for abdominal pain, constipation and diarrhea.    OBJECTIVE:   Temp:  [97.3 F (36.3 C)-98.1 F (36.7 C)] 97.7 F (36.5 C) (01/31 1117) Pulse Rate:  [54-79] 61 (01/31 1134) Resp:  [14-22] 16 (01/31 1134) BP: (106-151)/(47-121) 121/77 (01/31 1117) SpO2:  [87 %-100 %] 98 % (01/31 1134) FiO2 (%):  [32 %] 32 % (01/30 2311) Weight:  [85.9 kg] 85.9 kg (01/31 0257) Last BM Date : 09/23/23 Physical Exam Constitutional:      General: She is not in acute distress.    Appearance: She is not ill-appearing,  toxic-appearing or diaphoretic.  Cardiovascular:     Rate and Rhythm: Normal rate and regular rhythm.  Pulmonary:     Effort: No respiratory distress.     Breath sounds: Normal breath sounds.     Comments: Supplemental oxygen nasal cannula Abdominal:     General: Bowel sounds are normal. There is no distension.     Palpations: Abdomen is soft.     Tenderness: There is no abdominal tenderness. There is no guarding.  Neurological:     Mental Status: She is alert.     Labs: Recent Labs    09/22/23 2032 09/23/23 0630 09/23/23 2235 09/24/23 0700  WBC 2.3* 3.2*  --  3.6*  HGB 8.7* 6.8* 7.8* 7.8*  HCT 34.2* 26.1* 28.0* 28.3*  PLT 67* 95*  --  94*   BMET Recent Labs    09/22/23 2032 09/23/23 0630 09/24/23 0700  NA 145 148* 146*  K 3.2* 3.6 3.3*  CL 94* 96* 93*  CO2 39* 44* 45*  GLUCOSE 74 85 90  BUN 22 20 18   CREATININE 1.04* 1.03* 0.98  CALCIUM 9.7 9.5 9.6   LFT Recent Labs    09/23/23 0630  PROT 6.3*  ALBUMIN 2.9*  AST 21  ALT 13  ALKPHOS 98  BILITOT 0.5   PT/INR No results for input(s): "LABPROT", "INR" in the last 72 hours. Diagnostic imaging: ECHOCARDIOGRAM COMPLETE Result Date: 09/23/2023    ECHOCARDIOGRAM REPORT   Patient Name:   Erika Gates Date of Exam: 09/23/2023 Medical Rec #:  [id number]        Height:       60.0 in Accession #:    [id number]       Weight:       206.3 lb Date of Birth:  [birth year]        BSA:          1.892 m Patient Age:    88 years         BP:           129/54 mmHg Patient Gender: F                HR:           58 bpm. Exam Location:  Inpatient Procedure: 2D Echo, Cardiac Doppler and Color Doppler Indications:    CHF-Acute Diastolic I50.31  History:        Patient has prior history of Echocardiogram examinations, most                 recent 04/09/2023. CAD, Prior CABG; Risk Factors:Diabetes and                 Hypertension.  Sonographer:  Darlys Gales Referring Phys: Therisa Doyne IMPRESSIONS  1. Left ventricular ejection  fraction, by estimation, is 60 to 65%. The left ventricle has normal function. The left ventricle has no regional wall motion abnormalities. The left ventricular internal cavity size was mildly dilated. Left ventricular diastolic parameters are indeterminate. Elevated left ventricular end-diastolic pressure.  2. Right ventricular systolic function is moderately reduced. The right ventricular size is severely enlarged.  3. Right atrial size was mildly dilated.  4. The mitral valve is degenerative. Moderate mitral valve regurgitation. No evidence of mitral stenosis.  5. The aortic valve is tricuspid. Aortic valve regurgitation is not visualized. Aortic valve sclerosis/calcification is present, without any evidence of aortic stenosis. Aortic valve area, by VTI measures 2.18 cm. Aortic valve mean gradient measures 5.0 mmHg. Aortic valve Vmax measures 1.48 m/s. FINDINGS  Left Ventricle: Left ventricular ejection fraction, by estimation, is 60 to 65%. The left ventricle has normal function. The left ventricle has no regional wall motion abnormalities. The left ventricular internal cavity size was mildly dilated. There is  no left ventricular hypertrophy. Left ventricular diastolic parameters are indeterminate. Elevated left ventricular end-diastolic pressure. Right Ventricle: The right ventricular size is severely enlarged. No increase in right ventricular wall thickness. Right ventricular systolic function is moderately reduced. Left Atrium: Left atrial size was normal in size. Right Atrium: Right atrial size was mildly dilated. Pericardium: There is no evidence of pericardial effusion. Mitral Valve: The mitral valve is degenerative in appearance. There is mild calcification of the mitral valve leaflet(s). Moderate mitral valve regurgitation. No evidence of mitral valve stenosis. Tricuspid Valve: The tricuspid valve is normal in structure. Tricuspid valve regurgitation is mild . No evidence of tricuspid stenosis. Aortic  Valve: The aortic valve is tricuspid. Aortic valve regurgitation is not visualized. Aortic valve sclerosis/calcification is present, without any evidence of aortic stenosis. Aortic valve mean gradient measures 5.0 mmHg. Aortic valve peak gradient measures 8.8 mmHg. Aortic valve area, by VTI measures 2.18 cm. Pulmonic Valve: The pulmonic valve was normal in structure. Pulmonic valve regurgitation is trivial. No evidence of pulmonic stenosis. Aorta: The aortic root is normal in size and structure. Venous: The inferior vena cava was not well visualized. IAS/Shunts: No atrial level shunt detected by color flow Doppler.  LEFT VENTRICLE PLAX 2D LVIDd:         5.40 cm   Diastology LVIDs:         3.00 cm   LV e' medial:    5.55 cm/s LV PW:         0.90 cm   LV E/e' medial:  22.7 LV IVS:        0.70 cm   LV e' lateral:   7.51 cm/s LVOT diam:     1.80 cm   LV E/e' lateral: 16.8 LV SV:         78 LV SV Index:   41 LVOT Area:     2.54 cm  RIGHT VENTRICLE RV Basal diam:  4.90 cm RV Mid diam:    4.20 cm RV S prime:     8.05 cm/s TAPSE (M-mode): 1.5 cm LEFT ATRIUM             Index        RIGHT ATRIUM           Index LA Vol (A2C):   73.3 ml 38.75 ml/m  RA Area:     19.40 cm LA Vol (A4C):   49.5 ml 26.17 ml/m  RA Volume:  58.80 ml  31.09 ml/m LA Biplane Vol: 61.9 ml 32.72 ml/m  AORTIC VALVE AV Area (Vmax):    1.94 cm AV Area (Vmean):   2.13 cm AV Area (VTI):     2.18 cm AV Vmax:           148.00 cm/s AV Vmean:          99.100 cm/s AV VTI:            0.358 m AV Peak Grad:      8.8 mmHg AV Mean Grad:      5.0 mmHg LVOT Vmax:         113.00 cm/s LVOT Vmean:        82.900 cm/s LVOT VTI:          0.306 m LVOT/AV VTI ratio: 0.85  AORTA Ao Root diam: 2.90 cm MITRAL VALVE                  TRICUSPID VALVE MV Area (PHT): 3.46 cm       TR Peak grad:   55.4 mmHg MV Decel Time: 219 msec       TR Vmax:        372.00 cm/s MR Peak grad:    122.3 mmHg MR Mean grad:    90.0 mmHg    SHUNTS MR Vmax:         553.00 cm/s  Systemic VTI:  0.31  m MR Vmean:        458.0 cm/s   Systemic Diam: 1.80 cm MR PISA:         6.28 cm MR PISA Eff ROA: 43 mm MR PISA Radius:  1.00 cm MV E velocity: 126.00 cm/s MV A velocity: 125.00 cm/s MV E/A ratio:  1.01 Armanda Magic MD Electronically signed by Armanda Magic MD Signature Date/Time: 09/23/2023/10:08:55 AM    Final    DG Chest 2 View Result Date: 09/22/2023 CLINICAL DATA:  Shortness of breath and weakness for 5 days EXAM: CHEST - 2 VIEW COMPARISON:  X-ray 04/17/2023 and older FINDINGS: Status post median sternotomy. Enlarged cardiopericardial silhouette calcified aorta. Vascular congestion. Elevation of the right hemidiaphragm. Small pleural effusions. Left midlung atelectasis. Film is under penetrated. Degenerative changes noted along the spine and shoulders. Fixation hardware seen along the lower cervical spine at the edge of the imaging field. IMPRESSION: Postop chest with enlarged heart and vascular congestion. Small effusions. Right greater than left. Under penetrated radiograph Electronically Signed   By: Karen Kays M.D.   On: 09/22/2023 17:08   IMPRESSION: Symptomatic anemia History peptic ulcer disease  Paroxysmal atrial fibrillation, on Eliquis prior to admission (last dose 09/21/23) Constipation  Coronary artery disease Diabetes mellitus  PLAN: -Plan is for EGD tomorrow -Continue to hold Eliquis  -Trend H/H, transfuse for Hgb < 7  -Ok for diet today, NPO at midnight for procedure tomorrow -Further recommendations to follow pending procedure   LOS: 2 days   Liliane Shi, DO [facility]

## 2023-09-25 ENCOUNTER — Inpatient Hospital Stay (HOSPITAL_COMMUNITY): Payer: Self-pay | Admitting: Anesthesiology

## 2023-09-25 ENCOUNTER — Encounter (HOSPITAL_COMMUNITY)
Admission: EM | Disposition: A | Discharge status: Home Health | Payer: Self-pay | Source: Home / Self Care | Attending: Family Medicine

## 2023-09-25 ENCOUNTER — Encounter (HOSPITAL_COMMUNITY): Payer: Self-pay | Admitting: Internal Medicine

## 2023-09-25 DIAGNOSIS — K31819 Angiodysplasia of stomach and duodenum without bleeding: Secondary | ICD-10-CM | POA: Diagnosis not present

## 2023-09-25 DIAGNOSIS — I480 Paroxysmal atrial fibrillation: Secondary | ICD-10-CM | POA: Diagnosis not present

## 2023-09-25 DIAGNOSIS — D61818 Other pancytopenia: Secondary | ICD-10-CM | POA: Diagnosis not present

## 2023-09-25 DIAGNOSIS — D649 Anemia, unspecified: Secondary | ICD-10-CM | POA: Diagnosis not present

## 2023-09-25 DIAGNOSIS — D509 Iron deficiency anemia, unspecified: Secondary | ICD-10-CM | POA: Diagnosis not present

## 2023-09-25 DIAGNOSIS — I5033 Acute on chronic diastolic (congestive) heart failure: Secondary | ICD-10-CM | POA: Diagnosis not present

## 2023-09-25 HISTORY — PX: HOT HEMOSTASIS: SHX5433

## 2023-09-25 HISTORY — PX: ESOPHAGOGASTRODUODENOSCOPY: SHX5428

## 2023-09-25 LAB — CBC WITH DIFFERENTIAL/PLATELET
Abs Immature Granulocytes: 0.01 10*3/uL (ref 0.00–0.07)
Basophils Absolute: 0 10*3/uL (ref 0.0–0.1)
Basophils Relative: 0 %
Eosinophils Absolute: 0.1 10*3/uL (ref 0.0–0.5)
Eosinophils Relative: 2 %
HCT: 27.7 % — ABNORMAL LOW (ref 36.0–46.0)
Hemoglobin: 7.6 g/dL — ABNORMAL LOW (ref 12.0–15.0)
Immature Granulocytes: 0 %
Lymphocytes Relative: 11 %
Lymphs Abs: 0.5 10*3/uL — ABNORMAL LOW (ref 0.7–4.0)
MCH: 24.4 pg — ABNORMAL LOW (ref 26.0–34.0)
MCHC: 27.4 g/dL — ABNORMAL LOW (ref 30.0–36.0)
MCV: 89.1 fL (ref 80.0–100.0)
Monocytes Absolute: 0.6 10*3/uL (ref 0.1–1.0)
Monocytes Relative: 15 %
Neutro Abs: 3.1 10*3/uL (ref 1.7–7.7)
Neutrophils Relative %: 72 %
Platelets: 90 10*3/uL — ABNORMAL LOW (ref 150–400)
RBC: 3.11 MIL/uL — ABNORMAL LOW (ref 3.87–5.11)
RDW: 20.8 % — ABNORMAL HIGH (ref 11.5–15.5)
WBC: 4.3 10*3/uL (ref 4.0–10.5)
nRBC: 0 % (ref 0.0–0.2)

## 2023-09-25 LAB — BASIC METABOLIC PANEL
Anion gap: 8 (ref 5–15)
BUN: 19 mg/dL (ref 8–23)
CO2: 45 mmol/L — ABNORMAL HIGH (ref 22–32)
Calcium: 9.6 mg/dL (ref 8.9–10.3)
Chloride: 92 mmol/L — ABNORMAL LOW (ref 98–111)
Creatinine, Ser: 0.93 mg/dL (ref 0.44–1.00)
GFR, Estimated: 60 mL/min — ABNORMAL LOW (ref >60)
Glucose, Bld: 99 mg/dL (ref 70–99)
Potassium: 4.3 mmol/L (ref 3.5–5.1)
Sodium: 145 mmol/L (ref 135–145)

## 2023-09-25 LAB — GLUCOSE, CAPILLARY
Glucose-Capillary: 73 mg/dL (ref 70–99)
Glucose-Capillary: 90 mg/dL (ref 70–99)
Glucose-Capillary: 98 mg/dL (ref 70–99)
Glucose-Capillary: 130 mg/dL — ABNORMAL HIGH (ref 70–99)
Glucose-Capillary: 135 mg/dL — ABNORMAL HIGH (ref 70–99)

## 2023-09-25 SURGERY — EGD (ESOPHAGOGASTRODUODENOSCOPY)
Anesthesia: Monitor Anesthesia Care

## 2023-09-25 MED ORDER — SODIUM CHLORIDE 0.9 % IV SOLN: INTRAVENOUS | Status: DC | PRN

## 2023-09-25 MED ORDER — PANTOPRAZOLE SODIUM 40 MG PO TBEC
40.0000 mg | DELAYED_RELEASE_TABLET | Freq: Two times a day (BID) | ORAL | Status: DC
  Administered 2023-09-25 – 2023-09-29 (×8): 40 mg via ORAL
  Filled 2023-09-25 (×8): qty 1

## 2023-09-25 MED ORDER — PROPOFOL 10 MG/ML IV BOLUS
INTRAVENOUS | Status: DC | PRN
  Administered 2023-09-25: 10 mg via INTRAVENOUS
  Administered 2023-09-25: 20 mg via INTRAVENOUS

## 2023-09-25 MED ORDER — ONDANSETRON HCL 4 MG/2ML IJ SOLN
INTRAMUSCULAR | Status: DC | PRN
  Administered 2023-09-25: 4 mg via INTRAVENOUS

## 2023-09-25 MED ORDER — SODIUM CHLORIDE 0.9 % IV SOLN
100.0000 mg | Freq: Once | INTRAVENOUS | Status: AC
  Administered 2023-09-25: 100 mg via INTRAVENOUS
  Filled 2023-09-25: qty 5

## 2023-09-25 MED ORDER — LIDOCAINE 2% (20 MG/ML) 5 ML SYRINGE
INTRAMUSCULAR | Status: DC | PRN
  Administered 2023-09-25: 60 mg via INTRAVENOUS

## 2023-09-25 NOTE — Progress Notes (Signed)
DAILY PROGRESS NOTE   Patient Name: Erika Gates Date of Encounter: 09/25/2023 Cardiologist: Donato Schultz, MD  Chief Complaint   Breathing is ok  Patient Profile   Erika Gates is a 89 y.o. female with a hx of CAD with CABG in 1996, chronic diastolic heart failure, hypertension, HLD, type 2 DM, morbid obesity, persistent atrial fibrillation following post-op PEA arrest 07/2022, sinus bradycardia, pancytopenia, who is being seen 09/23/2023 for the evaluation of CHF at the request of Dr Jacqulyn Bath.   Subjective   Was down for EGD this morning -no clear culprit for acute bleeding but found to have some angiodysplasia.  Diuresed about 3.3L negative overnight- now 7 to 8L negative overall. Creatinine stable with diuresis. Potassium 4.3 today.   Objective   Vitals:   09/25/23 1027 09/25/23 1030 09/25/23 1045 09/25/23 1126  BP: 116/60 (!) 127/59 (!) 122/50 (!) 121/55  Pulse:  87 81 80  Resp:  13 20 (!) 21  Temp: 98.3 F (36.8 C)  (!) 97.5 F (36.4 C) 98.4 F (36.9 C)  TempSrc:    Oral  SpO2:  93% 92% 91%  Weight:      Height:        Intake/Output Summary (Last 24 hours) at 09/25/2023 1228 Last data filed at 09/25/2023 1100 Gross per 24 hour  Intake 465 ml  Output 3200 ml  Net -2735 ml   Filed Weights   09/23/23 1122 09/24/23 0257 09/25/23 0311  Weight: 86.3 kg 85.9 kg 84.5 kg    Physical Exam   General appearance: asleep Lungs: clear to auscultation bilaterally Heart: regular rate and rhythm Extremities: edema 2+ bilateral LE edema Neurologic: Mental status: cannot assess  Inpatient Medications    Scheduled Meds:  clonazePAM  0.5 mg Oral BID   furosemide  40 mg Intravenous BID   insulin aspart  0-15 Units Subcutaneous TID WC   leptospermum manuka honey  1 Application Topical Daily   liver oil-zinc oxide   Topical BID   losartan  25 mg Oral Daily   metoprolol succinate  25 mg Oral Daily   pantoprazole (PROTONIX) IV  40 mg Intravenous Q12H   rosuvastatin   20 mg Oral Daily    Continuous Infusions:   PRN Meds: acetaminophen **OR** acetaminophen, docusate sodium, ondansetron **OR** ondansetron (ZOFRAN) IV   Labs   Results for orders placed or performed during the hospital encounter of 09/22/23 (from the past 48 hours)  Glucose, capillary     Status: Abnormal   Collection Time: 09/23/23  3:59 PM  Result Value Ref Range   Glucose-Capillary 116 (H) 70 - 99 mg/dL    Comment: Glucose reference range applies only to samples taken after fasting for at least 8 hours.  Glucose, capillary     Status: Abnormal   Collection Time: 09/23/23  9:10 PM  Result Value Ref Range   Glucose-Capillary 127 (H) 70 - 99 mg/dL    Comment: Glucose reference range applies only to samples taken after fasting for at least 8 hours.  Hemoglobin and hematocrit, blood     Status: Abnormal   Collection Time: 09/23/23 10:35 PM  Result Value Ref Range   Hemoglobin 7.8 (L) 12.0 - 15.0 g/dL   HCT 16.1 (L) 09.6 - 04.5 %    Comment: Performed at [facility], [address] [place], [place] [address]  Glucose, capillary     Status: None   Collection Time: 09/24/23  6:07 AM  Result Value Ref  Range   Glucose-Capillary 83 70 - 99 mg/dL    Comment: Glucose reference range applies only to samples taken after fasting for at least 8 hours.  CBC with Differential/Platelet     Status: Abnormal   Collection Time: 09/24/23  7:00 AM  Result Value Ref Range   WBC 3.6 (L) 4.0 - 10.5 K/uL   RBC 3.19 (L) 3.87 - 5.11 MIL/uL   Hemoglobin 7.8 (L) 12.0 - 15.0 g/dL   HCT 16.1 (L) 09.6 - 04.5 %   MCV 88.7 80.0 - 100.0 fL   MCH 24.5 (L) 26.0 - 34.0 pg   MCHC 27.6 (L) 30.0 - 36.0 g/dL   RDW 40.9 (H) 81.1 - 91.4 %   Platelets 94 (L) 150 - 400 K/uL    Comment: Immature Platelet Fraction may be clinically indicated, consider ordering this additional test [id number] REPEATED TO VERIFY    nRBC 0.0 0.0 - 0.2 %   Neutrophils Relative % 73 %   Neutro Abs 2.7 1.7 - 7.7 K/uL    Lymphocytes Relative 10 %   Lymphs Abs 0.4 (L) 0.7 - 4.0 K/uL   Monocytes Relative 14 %   Monocytes Absolute 0.5 0.1 - 1.0 K/uL   Eosinophils Relative 2 %   Eosinophils Absolute 0.1 0.0 - 0.5 K/uL   Basophils Relative 0 %   Basophils Absolute 0.0 0.0 - 0.1 K/uL   Immature Granulocytes 1 %   Abs Immature Granulocytes 0.02 0.00 - 0.07 K/uL    Comment: Performed at [facility], [address], [place], [place] [address]  Basic metabolic panel     Status: Abnormal   Collection Time: 09/24/23  7:00 AM  Result Value Ref Range   Sodium 146 (H) 135 - 145 mmol/L   Potassium 3.3 (L) 3.5 - 5.1 mmol/L   Chloride 93 (L) 98 - 111 mmol/L   CO2 45 (H) 22 - 32 mmol/L   Glucose, Bld 90 70 - 99 mg/dL    Comment: Glucose reference range applies only to samples taken after fasting for at least 8 hours.   BUN 18 8 - 23 mg/dL   Creatinine, Ser 7.84 0.44 - 1.00 mg/dL   Calcium 9.6 8.9 - 69.6 mg/dL   GFR, Estimated 56 (L) >60 mL/min    Comment: (NOTE) Calculated using the CKD-EPI Creatinine Equation (2021)    Anion gap 8 5 - 15    Comment: Performed at [facility], [address] [place], [place] [address]  Glucose, capillary     Status: Abnormal   Collection Time: 09/24/23 11:14 AM  Result Value Ref Range   Glucose-Capillary 147 (H) 70 - 99 mg/dL    Comment: Glucose reference range applies only to samples taken after fasting for at least 8 hours.  Glucose, capillary     Status: Abnormal   Collection Time: 09/24/23  3:43 PM  Result Value Ref Range   Glucose-Capillary 104 (H) 70 - 99 mg/dL    Comment: Glucose reference range applies only to samples taken after fasting for at least 8 hours.  Glucose, capillary     Status: Abnormal   Collection Time: 09/24/23  9:06 PM  Result Value Ref Range   Glucose-Capillary 104 (H) 70 - 99 mg/dL    Comment: Glucose reference range applies only to samples taken after fasting for at least 8 hours.  CBC with Differential/Platelet     Status: Abnormal    Collection Time: 09/25/23  1:55 AM  Result Value  Ref Range   WBC 4.3 4.0 - 10.5 K/uL   RBC 3.11 (L) 3.87 - 5.11 MIL/uL   Hemoglobin 7.6 (L) 12.0 - 15.0 g/dL   HCT 16.1 (L) 09.6 - 04.5 %   MCV 89.1 80.0 - 100.0 fL   MCH 24.4 (L) 26.0 - 34.0 pg   MCHC 27.4 (L) 30.0 - 36.0 g/dL   RDW 40.9 (H) 81.1 - 91.4 %   Platelets 90 (L) 150 - 400 K/uL    Comment: Immature Platelet Fraction may be clinically indicated, consider ordering this additional test [id number] REPEATED TO VERIFY    nRBC 0.0 0.0 - 0.2 %   Neutrophils Relative % 72 %   Neutro Abs 3.1 1.7 - 7.7 K/uL   Lymphocytes Relative 11 %   Lymphs Abs 0.5 (L) 0.7 - 4.0 K/uL   Monocytes Relative 15 %   Monocytes Absolute 0.6 0.1 - 1.0 K/uL   Eosinophils Relative 2 %   Eosinophils Absolute 0.1 0.0 - 0.5 K/uL   Basophils Relative 0 %   Basophils Absolute 0.0 0.0 - 0.1 K/uL   Immature Granulocytes 0 %   Abs Immature Granulocytes 0.01 0.00 - 0.07 K/uL    Comment: Performed at [facility], [address], [place], [place] [address]  Basic metabolic panel     Status: Abnormal   Collection Time: 09/25/23  1:55 AM  Result Value Ref Range   Sodium 145 135 - 145 mmol/L   Potassium 4.3 3.5 - 5.1 mmol/L   Chloride 92 (L) 98 - 111 mmol/L   CO2 45 (H) 22 - 32 mmol/L   Glucose, Bld 99 70 - 99 mg/dL    Comment: Glucose reference range applies only to samples taken after fasting for at least 8 hours.   BUN 19 8 - 23 mg/dL   Creatinine, Ser 7.84 0.44 - 1.00 mg/dL   Calcium 9.6 8.9 - 69.6 mg/dL   GFR, Estimated 60 (L) >60 mL/min    Comment: (NOTE) Calculated using the CKD-EPI Creatinine Equation (2021)    Anion gap 8 5 - 15    Comment: Performed at [facility], [address] [place], [place] [address]  Glucose, capillary     Status: None   Collection Time: 09/25/23  6:06 AM  Result Value Ref Range   Glucose-Capillary 73 70 - 99 mg/dL    Comment: Glucose reference range applies only to samples taken after fasting for at  least 8 hours.  Glucose, capillary     Status: None   Collection Time: 09/25/23 10:31 AM  Result Value Ref Range   Glucose-Capillary 90 70 - 99 mg/dL    Comment: Glucose reference range applies only to samples taken after fasting for at least 8 hours.  Glucose, capillary     Status: None   Collection Time: 09/25/23 11:28 AM  Result Value Ref Range   Glucose-Capillary 98 70 - 99 mg/dL    Comment: Glucose reference range applies only to samples taken after fasting for at least 8 hours.    ECG   N/A  Telemetry   Sinus rhythm- Personally Reviewed  Radiology    No results found.  Cardiac Studies   N/A  Assessment   Principal Problem:   Acute on chronic diastolic CHF (congestive heart failure) (HCC) Active Problems:   HYPERCHOLESTEROLEMIA   Essential hypertension   History of coronary artery disease s/p CABG    Acute respiratory failure with hypoxia and hypercapnia (HCC)   Sinus  bradycardia   Pancytopenia (HCC)   Paroxysmal atrial fibrillation (HCC)   CKD (chronic kidney disease) stage 3, GFR 30-59 ml/min (HCC)   DM (diabetes mellitus) type II controlled with renal manifestation (HCC)   Hypoglycemia   Symptomatic anemia   Plan   Ms. Glatt has acute on chronic HFpEF, but more predominantly right heart failure symptoms. There is moderate MR on echo. She continues to diurese well on lasix 40 mg IV BID - will continue this over the weekend. Per GI, would be ok to restart Eliquis given history of afib, however, source of anemia is still not apparently. Discussed this with her daughter at the bedside. She would not be considered a Watchman candidate given age and co-morbidities. She also has a pancytopenia. I don't think she would be a good long-term anticoagulation candidate based on this.   Time Spent Directly with Patient:  I have spent a total of 25 minutes with the patient reviewing hospital notes, telemetry, EKGs, labs and examining the patient as well as establishing  an assessment and plan that was discussed personally with the patient.  > 50% of time was spent in direct patient care.  Length of Stay:  LOS: 3 days   Chrystie Nose, MD, [facility], FACP  [place]  [facility]  Medical Director of the [facility]  Cardiovascular Risk Reduction Clinic Diplomate of the American Board of Clinical Lipidology Attending Cardiologist  Direct Dial: [phone]  Fax: [phone]  Website:  [email or web address] Nicely Jordi Kamm 09/25/2023, 12:28 PM

## 2023-09-25 NOTE — Progress Notes (Signed)
PROGRESS NOTE    Erika Gates  MVH:[id number] DOB: [birth year] DOA: 09/22/2023 PCP: [name], MD   Brief Narrative:  Erika Gates is a 89 y.o. female with medical history significant of HTN, HLD, DM-2, PAF on Eliquis, CAD s/p CABG 1996, chronic HFpEF, PUD,  Sinus bradycardia, Pancytopenia, CKD 3a Presented with  fatigue increased SOB for the past 5 days. Primary care provider did some blood work a day prior to presentation that showed hemoglobin 7.4 and 91 platelets. Blood pressure 124/60 satting 93% on home 3 L. Patient still endorsing shortness of breath has had problems with anemia in the past has been using Eliquis. Reports no black stools no blood in stools.  Last EGD was done August 2024 which showed erosive gastropathy and nonbleeding duodenal ulcer.  Also, during prior admission, she had bradycardia with rates in 30s and TSH was borderline suggestive of sick euthyroid syndrome.  Since heart rate rebounded, beta-blocker was resumed.  Assessment & Plan:   Principal Problem:   Acute on chronic diastolic CHF (congestive heart failure) (HCC) Active Problems:   Acute respiratory failure with hypoxia and hypercapnia (HCC)   Sinus bradycardia   Pancytopenia (HCC)   Paroxysmal atrial fibrillation (HCC)   HYPERCHOLESTEROLEMIA   Essential hypertension   History of coronary artery disease s/p CABG    CKD (chronic kidney disease) stage 3, GFR 30-59 ml/min (HCC)   DM (diabetes mellitus) type II controlled with renal manifestation (HCC)   Hypoglycemia   Symptomatic anemia  Acute on chronic hypoxic respiratory failure, POA: Multifactorial, could be due to anemia as well as acute on chronic congestive heart failure with preserved ejection fraction: Uses 3 L of oxygen at baseline, required BiPAP upon arrival, has been weaned and currently on 4 L of oxygen.  Management below.  Acute on chronic congestive heart failure with preserved ejection fraction, POA: Chest x-ray shows vascular  congestion, BNP elevated but appears to be at baseline.  Had crackles upon arrival.  Recent echo done in August shows 70 to 75% ejection fraction with grade 2 diastolic dysfunction.  Appears to be taking Lasix 20 mg p.o. daily.  Started on Lasix 40 mg IV twice daily, net - 7.5 L.  Continue current management with a strict I's and O's.  Cardiology on board and managing.  Symptomatic acute on chronic anemia: Baseline hemoglobin appears to be between 8-8.5, came in with 8.7 which dropped to 6.8 upon admission for which she received 1 unit of PRBC transfusion, hemoglobin improved to 7.6.  GI bleeding was suspected, FOBT ordered, patient had a bowel movement on 09/23/2023 however stool was not collected.  I personally requested patient's primary RN on 09/24/2023 to make sure to collect sample to send for FOBT and patient did have another bowel movement after that but no sample collected again.  Patient underwent EGD 09/25/2023, was found to have AVM which was treated with APC, GI cleared her to resume anticoagulation however cardiology had lengthy discussion with the family and they do not believe patient is a good candidate for long-term anticoagulation due to multiple problems including source of GI is not yet clear and she also has pancytopenia so anticoagulation was not recommended.  Will transition to oral Protonix,  Monitor H&H every 12 hours.  Transfuse if less than 7.5 due to acute on chronic congestive heart failure.  GI also recommended that if anemia were to persist, consider outpatient video capsule endoscopy versus small bowel enteroscopy.  Will give her IV iron as recommended  by GI.  Pancytopenia: Platelets and WBCs have remained low and they are at baseline.  No indication of transfusion for platelets at the moment since it is 95.  PAF/sinus bradycardia: In sinus rhythm but rate in bradycardia range.  Toprol-XL reduced to 25 mg.  TSH normal.  Hypertension: Blood pressure low normal.  Hold lisinopril.   Resume Toprol-XL.  Type 2 diabetes mellitus: Appears to be on Farxiga at home.  Hemoglobin A1c only 5.4.  Does not need SSI.  Hyperlipidemia: Continue Crestor.  CKD stage IIIa: At baseline.  Hypernatremia: Improved  Hypokalemia: Resolved  DVT prophylaxis: SCDs Start: 09/23/23 0559 will avoid heparin   Code Status: Full Code  Family Communication: Daughter [name] present at bedside.  Plan of care discussed with patient in length and he/she verbalized understanding and agreed with it.  Status is: Inpatient Remains inpatient appropriate because: Needs IV diuresis   Estimated body mass index is 36.38 kg/m as calculated from the following:   Height as of this encounter: 5' (1.524 m).   Weight as of this encounter: 84.5 kg.  Pressure Injury 04/18/23 Coccyx Medial Stage 2 -  Partial thickness loss of dermis presenting as a shallow open injury with a red, pink wound bed without slough. cracked skin (Active)  04/18/23 0411  Location: Coccyx  Location Orientation: Medial  Staging: Stage 2 -  Partial thickness loss of dermis presenting as a shallow open injury with a red, pink wound bed without slough.  Wound Description (Comments): cracked skin  Present on Admission: No     Pressure Injury 09/23/23 Sacrum Stage 3 -  Full thickness tissue loss. Subcutaneous fat may be visible but bone, tendon or muscle are NOT exposed. 2 cm x 1 cm x 0.1 cm 50% yellow 50% pink moist (Active)  09/23/23 0711  Location: Sacrum  Location Orientation:   Staging: Stage 3 -  Full thickness tissue loss. Subcutaneous fat may be visible but bone, tendon or muscle are NOT exposed.  Wound Description (Comments): 2 cm x 1 cm x 0.1 cm 50% yellow 50% pink moist  Present on Admission: Yes  Dressing Type Foam - Lift dressing to assess site every shift 09/24/23 2210   Nutritional Assessment: Body mass index is 36.38 kg/m.[name] Seen by dietician.  I agree with the assessment and plan as outlined below: Nutrition Status:         . Skin Assessment: I have examined the patient's skin and I agree with the wound assessment as performed by the wound care RN as outlined below: Pressure Injury 04/18/23 Coccyx Medial Stage 2 -  Partial thickness loss of dermis presenting as a shallow open injury with a red, pink wound bed without slough. cracked skin (Active)  04/18/23 0411  Location: Coccyx  Location Orientation: Medial  Staging: Stage 2 -  Partial thickness loss of dermis presenting as a shallow open injury with a red, pink wound bed without slough.  Wound Description (Comments): cracked skin  Present on Admission: No     Pressure Injury 09/23/23 Sacrum Stage 3 -  Full thickness tissue loss. Subcutaneous fat may be visible but bone, tendon or muscle are NOT exposed. 2 cm x 1 cm x 0.1 cm 50% yellow 50% pink moist (Active)  09/23/23 0711  Location: Sacrum  Location Orientation:   Staging: Stage 3 -  Full thickness tissue loss. Subcutaneous fat may be visible but bone, tendon or muscle are NOT exposed.  Wound Description (Comments): 2 cm x 1 cm x 0.1 cm  50% yellow 50% pink moist  Present on Admission: Yes  Dressing Type Foam - Lift dressing to assess site every shift 09/24/23 2210    Consultants:  Cardiology  Procedures:  None  Antimicrobials:  Anti-infectives (From admission, onward)    None         Subjective: Patient seen and examined this afternoon, daughter at the bedside.  Patient has no complaints.  No shortness of breath.  Objective: Vitals:   09/24/23 2310 09/25/23 0100 09/25/23 0311 09/25/23 0753  BP: 115/62  136/68 132/61  Pulse: 60 66 67 74  Resp: 20 20 20 18   Temp: 97.8 F (36.6 C)  98.2 F (36.8 C) 98.7 F (37.1 C)  TempSrc: Oral  Oral Oral  SpO2: 100% 96% 90% 90%  Weight:   84.5 kg   Height:        Intake/Output Summary (Last 24 hours) at 09/25/2023 0832 Last data filed at 09/25/2023 6295 Gross per 24 hour  Intake 240 ml  Output 3600 ml  Net -3360 ml   Filed Weights    09/23/23 1122 09/24/23 0257 09/25/23 0311  Weight: 86.3 kg 85.9 kg 84.5 kg    Examination:  General exam: Appears calm and comfortable  Respiratory system: Faint crackles at bases. Respiratory effort normal. Cardiovascular system: S1 & S2 heard, RRR. No JVD, murmurs, rubs, gallops or clicks. No pedal edema. Gastrointestinal system: Abdomen is nondistended, soft and nontender. No organomegaly or masses felt. Normal bowel sounds heard. Central nervous system: Alert and oriented. No focal neurological deficits. Extremities: Symmetric 5 x 5 power. Skin: No rashes, lesions or ulcers.  Psychiatry: Judgement and insight appear normal. Mood & affect appropriate.   Data Reviewed: I have personally reviewed following labs and imaging studies  CBC: Recent Labs  Lab 09/22/23 1040 09/22/23 1101 09/22/23 2032 09/23/23 0630 09/23/23 2235 09/24/23 0700 09/25/23 0155  WBC 3.9*  --  2.3* 3.2*  --  3.6* 4.3  NEUTROABS 3.0  --  1.6*  --   --  2.7 3.1  HGB 7.4*   < > 8.7* 6.8* 7.8* 7.8* 7.6*  HCT 28.7*   < > 34.2* 26.1* 28.0* 28.3* 27.7*  MCV 90.5  --  91.2 90.6  --  88.7 89.1  PLT 102*  --  67* 95*  --  94* 90*   < > = values in this interval not displayed.   Basic Metabolic Panel: Recent Labs  Lab 09/22/23 1040 09/22/23 1101 09/22/23 2032 09/23/23 0630 09/24/23 0700 09/25/23 0155  NA 144 143 145 148* 146* 145  K 3.7 3.7 3.2* 3.6 3.3* 4.3  CL 96* 92* 94* 96* 93* 92*  CO2 38*  --  39* 44* 45* 45*  GLUCOSE 100* 102* 74 85 90 99  BUN 22 29* 22 20 18 19   CREATININE 1.26* 1.50* 1.04* 1.03* 0.98 0.93  CALCIUM 9.6  --  9.7 9.5 9.6 9.6  MG  --   --  2.6* 2.4  --   --   PHOS  --   --  3.4 3.1  --   --    GFR: Estimated Creatinine Clearance: 41.9 mL/min (by C-G formula based on SCr of 0.93 mg/dL). Liver Function Tests: Recent Labs  Lab 09/22/23 1040 09/23/23 0630  AST 28 21  ALT 14 13  ALKPHOS 102 98  BILITOT 0.6 0.5  PROT 6.5 6.3*  ALBUMIN 3.1* 2.9*   No results for input(s):  "LIPASE", "AMYLASE" in the last 168 hours. No results for input(s): "  AMMONIA" in the last 168 hours. Coagulation Profile: No results for input(s): "INR", "PROTIME" in the last 168 hours. Cardiac Enzymes: Recent Labs  Lab 09/22/23 2041  CKTOTAL 27*   BNP (last 3 results) No results for input(s): "PROBNP" in the last 8760 hours. HbA1C: Recent Labs    09/22/23 2100  HGBA1C 5.4   CBG: Recent Labs  Lab 09/24/23 0607 09/24/23 1114 09/24/23 1543 09/24/23 2106 09/25/23 0606  GLUCAP 83 147* 104* 104* 73   Lipid Profile: No results for input(s): "CHOL", "HDL", "LDLCALC", "TRIG", "CHOLHDL", "LDLDIRECT" in the last 72 hours. Thyroid Function Tests: Recent Labs    09/22/23 2030 09/22/23 2032  TSH 4.321  --   FREET4  --  0.95   Anemia Panel: Recent Labs    09/22/23 2030 09/22/23 2032  VITAMINB12 1,413*  --   FOLATE 35.1  --   FERRITIN  --  14  TIBC  --  392  IRON  --  27*  RETICCTPCT 2.2  --    Sepsis Labs: Recent Labs  Lab 09/22/23 2032  PROCALCITON <0.10    Recent Results (from the past 240 hours)  Resp panel by RT-PCR (RSV, Flu A&B, Covid) Anterior Nasal Swab     Status: None   Collection Time: 09/22/23  8:51 PM   Specimen: Anterior Nasal Swab  Result Value Ref Range Status   SARS Coronavirus 2 by RT PCR NEGATIVE NEGATIVE Final   Influenza A by PCR NEGATIVE NEGATIVE Final   Influenza B by PCR NEGATIVE NEGATIVE Final    Comment: (NOTE) The Xpert Xpress SARS-CoV-2/FLU/RSV plus assay is intended as an aid in the diagnosis of influenza from Nasopharyngeal swab specimens and should not be used as a sole basis for treatment. Nasal washings and aspirates are unacceptable for Xpert Xpress SARS-CoV-2/FLU/RSV testing.  Fact Sheet for Patients: BloggerCourse.com  Fact Sheet for Healthcare Providers: SeriousBroker.it  This test is not yet approved or cleared by the [place] FDA and has been authorized for  detection and/or diagnosis of SARS-CoV-2 by FDA under an Emergency Use Authorization (EUA). This EUA will remain in effect (meaning this test can be used) for the duration of the COVID-19 declaration under Section 564(b)(1) of the Act, 21 U.S.C. section 360bbb-3(b)(1), unless the authorization is terminated or revoked.     Resp Syncytial Virus by PCR NEGATIVE NEGATIVE Final    Comment: (NOTE) Fact Sheet for Patients: BloggerCourse.com  Fact Sheet for Healthcare Providers: SeriousBroker.it  This test is not yet approved or cleared by the [place] FDA and has been authorized for detection and/or diagnosis of SARS-CoV-2 by FDA under an Emergency Use Authorization (EUA). This EUA will remain in effect (meaning this test can be used) for the duration of the COVID-19 declaration under Section 564(b)(1) of the Act, 21 U.S.C. section 360bbb-3(b)(1), unless the authorization is terminated or revoked.  Performed at [facility], [address] [place], [place] [address]   MRSA Next Gen by PCR, Nasal     Status: None   Collection Time: 09/23/23 11:33 AM   Specimen: Nasal Mucosa; Nasal Swab  Result Value Ref Range Status   MRSA by PCR Next Gen NOT DETECTED NOT DETECTED Final    Comment: (NOTE) The GeneXpert MRSA Assay (FDA approved for NASAL specimens only), is one component of a comprehensive MRSA colonization surveillance program. It is not intended to diagnose MRSA infection nor to guide or monitor treatment for MRSA infections. Test performance is not FDA approved in patients less  than 68 years old. Performed at [facility], [address], [place], [place] [address]      Radiology Studies: ECHOCARDIOGRAM COMPLETE Result Date: 09/23/2023    ECHOCARDIOGRAM REPORT   Patient Name:   Erika Gates Date of Exam: 09/23/2023 Medical Rec #:  [id number]        Height:       60.0 in Accession #:    [id number]        Weight:       206.3 lb Date of Birth:  1935-03-11        BSA:          1.892 m Patient Age:    88 years         BP:           129/54 mmHg Patient Gender: F                HR:           58 bpm. Exam Location:  Inpatient Procedure: 2D Echo, Cardiac Doppler and Color Doppler Indications:    CHF-Acute Diastolic I50.31  History:        Patient has prior history of Echocardiogram examinations, most                 recent 04/09/2023. CAD, Prior CABG; Risk Factors:Diabetes and                 Hypertension.  Sonographer:    Darlys Gales Referring Phys: Therisa Doyne IMPRESSIONS  1. Left ventricular ejection fraction, by estimation, is 60 to 65%. The left ventricle has normal function. The left ventricle has no regional wall motion abnormalities. The left ventricular internal cavity size was mildly dilated. Left ventricular diastolic parameters are indeterminate. Elevated left ventricular end-diastolic pressure.  2. Right ventricular systolic function is moderately reduced. The right ventricular size is severely enlarged.  3. Right atrial size was mildly dilated.  4. The mitral valve is degenerative. Moderate mitral valve regurgitation. No evidence of mitral stenosis.  5. The aortic valve is tricuspid. Aortic valve regurgitation is not visualized. Aortic valve sclerosis/calcification is present, without any evidence of aortic stenosis. Aortic valve area, by VTI measures 2.18 cm. Aortic valve mean gradient measures 5.0 mmHg. Aortic valve Vmax measures 1.48 m/s. FINDINGS  Left Ventricle: Left ventricular ejection fraction, by estimation, is 60 to 65%. The left ventricle has normal function. The left ventricle has no regional wall motion abnormalities. The left ventricular internal cavity size was mildly dilated. There is  no left ventricular hypertrophy. Left ventricular diastolic parameters are indeterminate. Elevated left ventricular end-diastolic pressure. Right Ventricle: The right ventricular size is severely  enlarged. No increase in right ventricular wall thickness. Right ventricular systolic function is moderately reduced. Left Atrium: Left atrial size was normal in size. Right Atrium: Right atrial size was mildly dilated. Pericardium: There is no evidence of pericardial effusion. Mitral Valve: The mitral valve is degenerative in appearance. There is mild calcification of the mitral valve leaflet(s). Moderate mitral valve regurgitation. No evidence of mitral valve stenosis. Tricuspid Valve: The tricuspid valve is normal in structure. Tricuspid valve regurgitation is mild . No evidence of tricuspid stenosis. Aortic Valve: The aortic valve is tricuspid. Aortic valve regurgitation is not visualized. Aortic valve sclerosis/calcification is present, without any evidence of aortic stenosis. Aortic valve mean gradient measures 5.0 mmHg. Aortic valve peak gradient measures 8.8 mmHg. Aortic valve area, by VTI measures 2.18 cm. Pulmonic Valve: The pulmonic valve was normal  in structure. Pulmonic valve regurgitation is trivial. No evidence of pulmonic stenosis. Aorta: The aortic root is normal in size and structure. Venous: The inferior vena cava was not well visualized. IAS/Shunts: No atrial level shunt detected by color flow Doppler.  LEFT VENTRICLE PLAX 2D LVIDd:         5.40 cm   Diastology LVIDs:         3.00 cm   LV e' medial:    5.55 cm/s LV PW:         0.90 cm   LV E/e' medial:  22.7 LV IVS:        0.70 cm   LV e' lateral:   7.51 cm/s LVOT diam:     1.80 cm   LV E/e' lateral: 16.8 LV SV:         78 LV SV Index:   41 LVOT Area:     2.54 cm  RIGHT VENTRICLE RV Basal diam:  4.90 cm RV Mid diam:    4.20 cm RV S prime:     8.05 cm/s TAPSE (M-mode): 1.5 cm LEFT ATRIUM             Index        RIGHT ATRIUM           Index LA Vol (A2C):   73.3 ml 38.75 ml/m  RA Area:     19.40 cm LA Vol (A4C):   49.5 ml 26.17 ml/m  RA Volume:   58.80 ml  31.09 ml/m LA Biplane Vol: 61.9 ml 32.72 ml/m  AORTIC VALVE AV Area (Vmax):    1.94  cm AV Area (Vmean):   2.13 cm AV Area (VTI):     2.18 cm AV Vmax:           148.00 cm/s AV Vmean:          99.100 cm/s AV VTI:            0.358 m AV Peak Grad:      8.8 mmHg AV Mean Grad:      5.0 mmHg LVOT Vmax:         113.00 cm/s LVOT Vmean:        82.900 cm/s LVOT VTI:          0.306 m LVOT/AV VTI ratio: 0.85  AORTA Ao Root diam: 2.90 cm MITRAL VALVE                  TRICUSPID VALVE MV Area (PHT): 3.46 cm       TR Peak grad:   55.4 mmHg MV Decel Time: 219 msec       TR Vmax:        372.00 cm/s MR Peak grad:    122.3 mmHg MR Mean grad:    90.0 mmHg    SHUNTS MR Vmax:         553.00 cm/s  Systemic VTI:  0.31 m MR Vmean:        458.0 cm/s   Systemic Diam: 1.80 cm MR PISA:         6.28 cm MR PISA Eff ROA: 43 mm MR PISA Radius:  1.00 cm MV E velocity: 126.00 cm/s MV A velocity: 125.00 cm/s MV E/A ratio:  1.01 Armanda Magic MD Electronically signed by Armanda Magic MD Signature Date/Time: 09/23/2023/10:08:55 AM    Final     Scheduled Meds:  clonazePAM  0.5 mg Oral BID   furosemide  40 mg Intravenous BID  insulin aspart  0-15 Units Subcutaneous TID WC   leptospermum manuka honey  1 Application Topical Daily   liver oil-zinc oxide   Topical BID   losartan  25 mg Oral Daily   metoprolol succinate  25 mg Oral Daily   pantoprazole (PROTONIX) IV  40 mg Intravenous Q12H   rosuvastatin  20 mg Oral Daily   Continuous Infusions:   LOS: 3 days   Hughie Closs, MD [facility]  09/25/2023, 8:32 AM   *Please note that this is a verbal dictation therefore any spelling or grammatical errors are due to the "Dragon Medical One" system interpretation.  Please page via Amion and do not message via secure chat for urgent patient care matters. Secure chat can be used for non urgent patient care matters.  How to contact the [facility] Attending or Consulting provider 7A - 7P or covering provider during after hours 7P -7A, for this patient?  Check the care team in [facility] and look for a) attending/consulting [facility]  provider listed and b) the [facility] team listed. Page or secure chat 7A-7P. Log into [email or web address] and use [place]'s universal password to access. If you do not have the password, please contact the hospital operator. Locate the [facility] provider you are looking for under [facility] and page to a number that you can be directly reached. If you still have difficulty reaching the provider, please page the [facility] (Director on Call) for the Hospitalists listed on amion for assistance.

## 2023-09-25 NOTE — Op Note (Signed)
[facility] Patient Name: Erika Gates Procedure Date : 09/25/2023 MRN: [id number] Attending MD: Liliane Shi DO, DO, [id number] Date of Birth: 1935/08/02 CSN: [id number] Age: 89 Admit Type: Inpatient Procedure:                Upper GI endoscopy Indications:              Iron deficiency anemia due to suspected upper                            gastrointestinal bleeding, History peptic ulcer                            disease. Providers:                Liliane Shi DO, DO, Eliberto Ivory, RN, Kandice Robinsons, Technician Referring MD:              Medicines:                See the Anesthesia note for documentation of the                            administered medications Complications:            No immediate complications. Estimated Blood Loss:     Estimated blood loss was minimal. Procedure:                Pre-Anesthesia Assessment:                           - ASA Grade Assessment: III - A patient with severe                            systemic disease.                           - The risks and benefits of the procedure and the                            sedation options and risks were discussed with the                            patient. All questions were answered and informed                            consent was obtained.                           After obtaining informed consent, the endoscope was                            passed under direct vision. Throughout the                            procedure, the patient's blood pressure, pulse, and  oxygen saturations were monitored continuously. The                            [id number]-[id number] ([id number]) Olympus endoscope was introduced                            through the mouth, and advanced to the second part                            of duodenum. The upper GI endoscopy was                            accomplished without difficulty. The patient                             tolerated the procedure well. Scope In: Scope Out: Findings:      The examined esophagus was normal.      The Z-line was regular and was found 40 cm from the incisors.      The entire examined stomach was normal.      A single 4 mm angiodysplastic lesion without bleeding was found in the       second portion of the duodenum. Coagulation for bleeding prevention       using argon plasma was successful. Impression:               - Normal esophagus.                           - Z-line regular, 40 cm from the incisors.                           - Normal stomach.                           - A single non-bleeding angiodysplastic lesion in                            the duodenum. Treated with argon plasma coagulation                            (APC).                           - No specimens collected. Recommendation:           - Return patient to hospital ward for ongoing care.                           - Resume regular diet.                           - Continue present medications.                           - Ok to resume anticoagulation (Eliquis).                           -  Check hemoglobin daily.                           - If anemia were to persist, consider outpatient                            video capsule endoscopy versus small bowel                            enteroscopy.                           - The findings and recommendations were discussed                            with the patient's family.                           - Discussed with Internal Medicine team. Procedure Code(s):        --- Professional ---                           [phone], Esophagogastroduodenoscopy, flexible,                            transoral; with control of bleeding, any method Diagnosis Code(s):        --- Professional ---                           K31.819, Angiodysplasia of stomach and duodenum                            without bleeding                           D50.9, Iron deficiency anemia,  unspecified CPT copyright 2022 American Medical Association. All rights reserved. The codes documented in this report are preliminary and upon coder review may  be revised to meet current compliance requirements. Dr Liliane Shi, DO Liliane Shi DO, DO 09/25/2023 10:38:04 AM Number of Addenda: 0

## 2023-09-25 NOTE — Interval H&P Note (Signed)
History and Physical Interval Note:  09/25/2023 9:21 AM  Erika Gates  has presented today for surgery, with the diagnosis of Symptomatic anemia, history peptic ulcer disease.  The various methods of treatment have been discussed with the patient and family. After consideration of risks, benefits and other options for treatment, the patient and family has consented to  Procedure(s): ESOPHAGOGASTRODUODENOSCOPY (EGD) (N/A) as a surgical intervention.  The patient's history has been reviewed, patient examined, no change in status, stable for surgery.  I have reviewed the patient's chart and labs.  Questions were answered to the patient's satisfaction.     Lynann Bologna

## 2023-09-25 NOTE — Transfer of Care (Signed)
Immediate Anesthesia Transfer of Care Note  Patient: Erika Gates  Procedure(s) Performed: ESOPHAGOGASTRODUODENOSCOPY (EGD) HOT HEMOSTASIS (ARGON PLASMA COAGULATION/BICAP)  Patient Location: PACU  Anesthesia Type:MAC  Level of Consciousness: awake and drowsy  Airway & Oxygen Therapy: Patient Spontanous Breathing and Patient connected to nasal cannula oxygen  Post-op Assessment: Report given to RN and Post -op Vital signs reviewed and stable  Post vital signs: Reviewed and stable  Last Vitals:  Vitals Value Taken Time  BP 127/59 09/25/23 1030  Temp    Pulse 84 09/25/23 1035  Resp 21 09/25/23 1035  SpO2 89 % 09/25/23 1035  Vitals shown include unfiled device data.  Last Pain:  Vitals:   09/25/23 0914  TempSrc: Temporal  PainSc:       Patients Stated Pain Goal: 0 (09/24/23 2210)  Complications: No notable events documented.

## 2023-09-25 NOTE — Progress Notes (Signed)
Pt refused use of BIPAP for the evening.

## 2023-09-25 NOTE — Anesthesia Postprocedure Evaluation (Signed)
Anesthesia Post Note  Patient: Erika Gates  Procedure(s) Performed: ESOPHAGOGASTRODUODENOSCOPY (EGD) HOT HEMOSTASIS (ARGON PLASMA COAGULATION/BICAP)     Patient location during evaluation: PACU Anesthesia Type: MAC Level of consciousness: awake and alert Pain management: pain level controlled Vital Signs Assessment: post-procedure vital signs reviewed and stable Respiratory status: spontaneous breathing, nonlabored ventilation, respiratory function stable and patient connected to nasal cannula oxygen Cardiovascular status: stable and blood pressure returned to baseline Postop Assessment: no apparent nausea or vomiting Anesthetic complications: no   No notable events documented.  Last Vitals:  Vitals:   09/25/23 1300 09/25/23 1400  BP: (!) 129/58 114/60  Pulse: 81 76  Resp: 18 18  Temp:    SpO2: 97% 93%    Last Pain:  Vitals:   09/25/23 1126  TempSrc: Oral  PainSc: 0-No pain                 Earl Lites P Breionna Punt

## 2023-09-26 DIAGNOSIS — I5033 Acute on chronic diastolic (congestive) heart failure: Secondary | ICD-10-CM | POA: Diagnosis not present

## 2023-09-26 LAB — BASIC METABOLIC PANEL
Anion gap: 6 (ref 5–15)
BUN: 16 mg/dL (ref 8–23)
CO2: 42 mmol/L — ABNORMAL HIGH (ref 22–32)
Calcium: 9.6 mg/dL (ref 8.9–10.3)
Chloride: 93 mmol/L — ABNORMAL LOW (ref 98–111)
Creatinine, Ser: 0.88 mg/dL (ref 0.44–1.00)
GFR, Estimated: >60 mL/min (ref >60)
Glucose, Bld: 96 mg/dL (ref 70–99)
Potassium: 4.4 mmol/L (ref 3.5–5.1)
Sodium: 141 mmol/L (ref 135–145)

## 2023-09-26 LAB — CBC WITH DIFFERENTIAL/PLATELET
Abs Immature Granulocytes: 0.01 10*3/uL (ref 0.00–0.07)
Basophils Absolute: 0 10*3/uL (ref 0.0–0.1)
Basophils Relative: 1 %
Eosinophils Absolute: 0.1 10*3/uL (ref 0.0–0.5)
Eosinophils Relative: 2 %
HCT: 30.4 % — ABNORMAL LOW (ref 36.0–46.0)
Hemoglobin: 8.1 g/dL — ABNORMAL LOW (ref 12.0–15.0)
Immature Granulocytes: 0 %
Lymphocytes Relative: 16 %
Lymphs Abs: 0.6 10*3/uL — ABNORMAL LOW (ref 0.7–4.0)
MCH: 24.2 pg — ABNORMAL LOW (ref 26.0–34.0)
MCHC: 26.6 g/dL — ABNORMAL LOW (ref 30.0–36.0)
MCV: 90.7 fL (ref 80.0–100.0)
Monocytes Absolute: 0.6 10*3/uL (ref 0.1–1.0)
Monocytes Relative: 17 %
Neutro Abs: 2.5 10*3/uL (ref 1.7–7.7)
Neutrophils Relative %: 64 %
Platelets: 103 10*3/uL — ABNORMAL LOW (ref 150–400)
RBC: 3.35 MIL/uL — ABNORMAL LOW (ref 3.87–5.11)
RDW: 20.7 % — ABNORMAL HIGH (ref 11.5–15.5)
WBC: 3.8 10*3/uL — ABNORMAL LOW (ref 4.0–10.5)
nRBC: 0 % (ref 0.0–0.2)

## 2023-09-26 LAB — GLUCOSE, CAPILLARY
Glucose-Capillary: 71 mg/dL (ref 70–99)
Glucose-Capillary: 72 mg/dL (ref 70–99)
Glucose-Capillary: 110 mg/dL — ABNORMAL HIGH (ref 70–99)
Glucose-Capillary: 99 mg/dL (ref 70–99)
Glucose-Capillary: 110 mg/dL — ABNORMAL HIGH (ref 70–99)

## 2023-09-26 LAB — OCCULT BLOOD X 1 CARD TO LAB, STOOL: Fecal Occult Bld: POSITIVE — AB

## 2023-09-26 MED ORDER — AMIODARONE HCL IN DEXTROSE 360-4.14 MG/200ML-% IV SOLN
60.0000 mg/h | INTRAVENOUS | Status: AC
Start: 2023-09-26 — End: 2023-09-26
  Administered 2023-09-26 (×2): 60 mg/h via INTRAVENOUS
  Filled 2023-09-26 (×2): qty 200

## 2023-09-26 MED ORDER — AMIODARONE HCL IN DEXTROSE 360-4.14 MG/200ML-% IV SOLN
30.0000 mg/h | INTRAVENOUS | Status: DC
  Administered 2023-09-26 – 2023-09-27 (×3): 30 mg/h via INTRAVENOUS
  Filled 2023-09-26 (×3): qty 200

## 2023-09-26 MED ORDER — AMIODARONE LOAD VIA INFUSION
150.0000 mg | Freq: Once | INTRAVENOUS | Status: AC
  Administered 2023-09-26: 150 mg via INTRAVENOUS
  Filled 2023-09-26: qty 83.34

## 2023-09-26 NOTE — Progress Notes (Signed)
PROGRESS NOTE    Erika Gates  WGN:[id number] DOB: Jul 23, 1935 DOA: 09/22/2023 PCP: [name], MD   Brief Narrative:  Erika Gates is a 89 y.o. female with medical history significant of HTN, HLD, DM-2, PAF on Eliquis, CAD s/p CABG 1996, chronic HFpEF, PUD,  Sinus bradycardia, Pancytopenia, CKD 3a Presented with  fatigue increased SOB for the past 5 days. Primary care provider did some blood work a day prior to presentation that showed hemoglobin 7.4 and 91 platelets. Blood pressure 124/60 satting 93% on home 3 L. Patient still endorsing shortness of breath has had problems with anemia in the past has been using Eliquis. Reports no black stools no blood in stools.  Last EGD was done August 2024 which showed erosive gastropathy and nonbleeding duodenal ulcer.  Also, during prior admission, she had bradycardia with rates in 30s and TSH was borderline suggestive of sick euthyroid syndrome.  Since heart rate rebounded, beta-blocker was resumed.  Assessment & Plan:   Principal Problem:   Acute on chronic diastolic CHF (congestive heart failure) (HCC) Active Problems:   Acute respiratory failure with hypoxia and hypercapnia (HCC)   Sinus bradycardia   Pancytopenia (HCC)   Paroxysmal atrial fibrillation (HCC)   HYPERCHOLESTEROLEMIA   Essential hypertension   History of coronary artery disease s/p CABG    CKD (chronic kidney disease) stage 3, GFR 30-59 ml/min (HCC)   DM (diabetes mellitus) type II controlled with renal manifestation (HCC)   Hypoglycemia   Symptomatic anemia  Acute on chronic hypoxic respiratory failure, POA: Multifactorial, could be due to anemia as well as acute on chronic congestive heart failure with preserved ejection fraction: Uses 3 L of oxygen at baseline, required BiPAP upon arrival, has been weaned and currently on 3 L of oxygen.  Management below.  Acute on chronic congestive heart failure with preserved ejection fraction, POA: Chest x-ray shows vascular  congestion, BNP elevated but appears to be at baseline.  Had crackles upon arrival.  Recent echo done in August shows 70 to 75% ejection fraction with grade 2 diastolic dysfunction.  Appears to be taking Lasix 20 mg p.o. daily.  Started on Lasix 40 mg IV twice daily, net - 8.8 L.  Continue current management with a strict I's and O's.  Cardiology on board and managing.  Symptomatic acute on chronic anemia: Baseline hemoglobin appears to be between 8-8.5, came in with 8.7 which dropped to 6.8 upon admission for which she received 1 unit of PRBC transfusion, hemoglobin improved to 7.6.  GI bleeding was suspected, FOBT ordered, patient had a bowel movement on 09/23/2023 however stool was not collected.  I personally requested patient's primary RN on 09/24/2023 to make sure to collect sample to send for FOBT and patient did have another bowel movement after that but no sample collected again.  Patient underwent EGD 09/25/2023, was found to have AVM which was treated with APC, GI cleared her to resume anticoagulation however cardiology had lengthy discussion with the family and they do not believe patient is a good candidate for long-term anticoagulation due to multiple problems including source of GI is not yet clear and she also has pancytopenia so anticoagulation was not recommended.  Transition to oral Protonix, a dose of IV iron given, H&H stable, continue to monitor daily.  Pancytopenia: Platelets and WBCs have remained low and they are at baseline.  No indication of transfusion for platelets at the moment since it is 95.  PAF/sinus bradycardia: Flipped into atrial fibrillation this morning,  started on Amio drip.  Toprol-XL reduced to 25 mg.  TSH normal.  Cardiology managing.  Hypertension: Blood pressure low normal.  Hold lisinopril.  Continue Toprol-XL.  Type 2 diabetes mellitus: Appears to be on Farxiga at home.  Hemoglobin A1c only 5.4.  Does not need SSI.  Hyperlipidemia: Continue Crestor.  CKD stage  IIIa: At baseline.  Hypernatremia: Improved  Hypokalemia: Resolved  DVT prophylaxis: SCDs Start: 09/23/23 0559 will avoid heparin   Code Status: Full Code  Family Communication:  none present at bedside.  Plan of care discussed with patient in length and he/she verbalized understanding and agreed with it.  Status is: Inpatient Remains inpatient appropriate because: Needs IV diuresis and now on amiodarone drip  Estimated body mass index is 34.83 kg/m as calculated from the following:   Height as of this encounter: 5' (1.524 m).   Weight as of this encounter: 80.9 kg.  Pressure Injury 04/18/23 Coccyx Medial Stage 2 -  Partial thickness loss of dermis presenting as a shallow open injury with a red, pink wound bed without slough. cracked skin (Active)  04/18/23 0411  Location: Coccyx  Location Orientation: Medial  Staging: Stage 2 -  Partial thickness loss of dermis presenting as a shallow open injury with a red, pink wound bed without slough.  Wound Description (Comments): cracked skin  Present on Admission: No     Pressure Injury 09/23/23 Sacrum Stage 3 -  Full thickness tissue loss. Subcutaneous fat may be visible but bone, tendon or muscle are NOT exposed. 2 cm x 1 cm x 0.1 cm 50% yellow 50% pink moist (Active)  09/23/23 0711  Location: Sacrum  Location Orientation:   Staging: Stage 3 -  Full thickness tissue loss. Subcutaneous fat may be visible but bone, tendon or muscle are NOT exposed.  Wound Description (Comments): 2 cm x 1 cm x 0.1 cm 50% yellow 50% pink moist  Present on Admission: Yes  Dressing Type Foam - Lift dressing to assess site every shift 09/25/23 1500   Nutritional Assessment: Body mass index is 34.83 kg/m.[name] Seen by dietician.  I agree with the assessment and plan as outlined below: Nutrition Status:        . Skin Assessment: I have examined the patient's skin and I agree with the wound assessment as performed by the wound care RN as outlined  below: Pressure Injury 04/18/23 Coccyx Medial Stage 2 -  Partial thickness loss of dermis presenting as a shallow open injury with a red, pink wound bed without slough. cracked skin (Active)  04/18/23 0411  Location: Coccyx  Location Orientation: Medial  Staging: Stage 2 -  Partial thickness loss of dermis presenting as a shallow open injury with a red, pink wound bed without slough.  Wound Description (Comments): cracked skin  Present on Admission: No     Pressure Injury 09/23/23 Sacrum Stage 3 -  Full thickness tissue loss. Subcutaneous fat may be visible but bone, tendon or muscle are NOT exposed. 2 cm x 1 cm x 0.1 cm 50% yellow 50% pink moist (Active)  09/23/23 0711  Location: Sacrum  Location Orientation:   Staging: Stage 3 -  Full thickness tissue loss. Subcutaneous fat may be visible but bone, tendon or muscle are NOT exposed.  Wound Description (Comments): 2 cm x 1 cm x 0.1 cm 50% yellow 50% pink moist  Present on Admission: Yes  Dressing Type Foam - Lift dressing to assess site every shift 09/25/23 1500    Consultants:  Cardiology  Procedures:  None  Antimicrobials:  Anti-infectives (From admission, onward)    None         Subjective: Seen and examined.  No complaints.  Denies any palpitation, chest pain or shortness of breath.  Nurse at the bedside.  Objective: Vitals:   09/25/23 2337 09/26/23 0343 09/26/23 0729 09/26/23 0853  BP: 132/63 122/78  (!) 125/59  Pulse: 75 82  (!) 122  Resp: 20 18 18    Temp: 97.6 F (36.4 C) 97.9 F (36.6 C) 98.3 F (36.8 C)   TempSrc: Oral Oral Oral   SpO2: 100% 94%    Weight:  80.9 kg    Height:        Intake/Output Summary (Last 24 hours) at 09/26/2023 0914 Last data filed at 09/25/2023 2337 Gross per 24 hour  Intake 570 ml  Output 1900 ml  Net -1330 ml   Filed Weights   09/24/23 0257 09/25/23 0311 09/26/23 0343  Weight: 85.9 kg 84.5 kg 80.9 kg    Examination:  General exam: Appears calm and comfortable   Respiratory system: Bibasilar crackles. Respiratory effort normal. Cardiovascular system: S1 & S2 heard, irregularly irregular RR. No JVD, murmurs, rubs, gallops or clicks. No pedal edema. Gastrointestinal system: Abdomen is nondistended, soft and nontender. No organomegaly or masses felt. Normal bowel sounds heard. Central nervous system: Alert and oriented. No focal neurological deficits. Extremities: Symmetric 5 x 5 power. Skin: No rashes, lesions or ulcers.   Data Reviewed: I have personally reviewed following labs and imaging studies  CBC: Recent Labs  Lab 09/22/23 1040 09/22/23 1101 09/22/23 2032 09/23/23 0630 09/23/23 2235 09/24/23 0700 09/25/23 0155 09/26/23 0209  WBC 3.9*  --  2.3* 3.2*  --  3.6* 4.3 3.8*  NEUTROABS 3.0  --  1.6*  --   --  2.7 3.1 2.5  HGB 7.4*   < > 8.7* 6.8* 7.8* 7.8* 7.6* 8.1*  HCT 28.7*   < > 34.2* 26.1* 28.0* 28.3* 27.7* 30.4*  MCV 90.5  --  91.2 90.6  --  88.7 89.1 90.7  PLT 102*  --  67* 95*  --  94* 90* 103*   < > = values in this interval not displayed.   Basic Metabolic Panel: Recent Labs  Lab 09/22/23 2032 09/23/23 0630 09/24/23 0700 09/25/23 0155 09/26/23 0209  NA 145 148* 146* 145 141  K 3.2* 3.6 3.3* 4.3 4.4  CL 94* 96* 93* 92* 93*  CO2 39* 44* 45* 45* 42*  GLUCOSE 74 85 90 99 96  BUN 22 20 18 19 16   CREATININE 1.04* 1.03* 0.98 0.93 0.88  CALCIUM 9.7 9.5 9.6 9.6 9.6  MG 2.6* 2.4  --   --   --   PHOS 3.4 3.1  --   --   --    GFR: Estimated Creatinine Clearance: 43.2 mL/min (by C-G formula based on SCr of 0.88 mg/dL). Liver Function Tests: Recent Labs  Lab 09/22/23 1040 09/23/23 0630  AST 28 21  ALT 14 13  ALKPHOS 102 98  BILITOT 0.6 0.5  PROT 6.5 6.3*  ALBUMIN 3.1* 2.9*   No results for input(s): "LIPASE", "AMYLASE" in the last 168 hours. No results for input(s): "AMMONIA" in the last 168 hours. Coagulation Profile: No results for input(s): "INR", "PROTIME" in the last 168 hours. Cardiac Enzymes: Recent Labs   Lab 09/22/23 2041  CKTOTAL 27*   BNP (last 3 results) No results for input(s): "PROBNP" in the last 8760 hours. HbA1C: No results  for input(s): "HGBA1C" in the last 72 hours.  CBG: Recent Labs  Lab 09/25/23 1128 09/25/23 1610 09/25/23 2109 09/26/23 0604 09/26/23 0732  GLUCAP 98 130* 135* 71 72   Lipid Profile: No results for input(s): "CHOL", "HDL", "LDLCALC", "TRIG", "CHOLHDL", "LDLDIRECT" in the last 72 hours. Thyroid Function Tests: No results for input(s): "TSH", "T4TOTAL", "FREET4", "T3FREE", "THYROIDAB" in the last 72 hours.  Anemia Panel: No results for input(s): "VITAMINB12", "FOLATE", "FERRITIN", "TIBC", "IRON", "RETICCTPCT" in the last 72 hours.  Sepsis Labs: Recent Labs  Lab 09/22/23 2032  PROCALCITON <0.10    Recent Results (from the past 240 hours)  Resp panel by RT-PCR (RSV, Flu A&B, Covid) Anterior Nasal Swab     Status: None   Collection Time: 09/22/23  8:51 PM   Specimen: Anterior Nasal Swab  Result Value Ref Range Status   SARS Coronavirus 2 by RT PCR NEGATIVE NEGATIVE Final   Influenza A by PCR NEGATIVE NEGATIVE Final   Influenza B by PCR NEGATIVE NEGATIVE Final    Comment: (NOTE) The Xpert Xpress SARS-CoV-2/FLU/RSV plus assay is intended as an aid in the diagnosis of influenza from Nasopharyngeal swab specimens and should not be used as a sole basis for treatment. Nasal washings and aspirates are unacceptable for Xpert Xpress SARS-CoV-2/FLU/RSV testing.  Fact Sheet for Patients: BloggerCourse.com  Fact Sheet for Healthcare Providers: SeriousBroker.it  This test is not yet approved or cleared by the [place] FDA and has been authorized for detection and/or diagnosis of SARS-CoV-2 by FDA under an Emergency Use Authorization (EUA). This EUA will remain in effect (meaning this test can be used) for the duration of the COVID-19 declaration under Section 564(b)(1) of the Act, 21  U.S.C. section 360bbb-3(b)(1), unless the authorization is terminated or revoked.     Resp Syncytial Virus by PCR NEGATIVE NEGATIVE Final    Comment: (NOTE) Fact Sheet for Patients: BloggerCourse.com  Fact Sheet for Healthcare Providers: SeriousBroker.it  This test is not yet approved or cleared by the [place] FDA and has been authorized for detection and/or diagnosis of SARS-CoV-2 by FDA under an Emergency Use Authorization (EUA). This EUA will remain in effect (meaning this test can be used) for the duration of the COVID-19 declaration under Section 564(b)(1) of the Act, 21 U.S.C. section 360bbb-3(b)(1), unless the authorization is terminated or revoked.  Performed at [facility], [address], [place], [place] [address]   MRSA Next Gen by PCR, Nasal     Status: None   Collection Time: 09/23/23 11:33 AM   Specimen: Nasal Mucosa; Nasal Swab  Result Value Ref Range Status   MRSA by PCR Next Gen NOT DETECTED NOT DETECTED Final    Comment: (NOTE) The GeneXpert MRSA Assay (FDA approved for NASAL specimens only), is one component of a comprehensive MRSA colonization surveillance program. It is not intended to diagnose MRSA infection nor to guide or monitor treatment for MRSA infections. Test performance is not FDA approved in patients less than 41 years old. Performed at [facility], [address] [place], [place] [address]      Radiology Studies: No results found.   Scheduled Meds:  clonazePAM  0.5 mg Oral BID   furosemide  40 mg Intravenous BID   insulin aspart  0-15 Units Subcutaneous TID WC   leptospermum manuka honey  1 Application Topical Daily   liver oil-zinc oxide   Topical BID   losartan  25 mg Oral Daily   metoprolol succinate  25  mg Oral Daily   pantoprazole  40 mg Oral BID   rosuvastatin  20 mg Oral Daily   Continuous Infusions:  amiodarone 60 mg/hr (09/26/23 0909)   Followed by    amiodarone       LOS: 4 days   Hughie Closs, MD [facility]  09/26/2023, 9:14 AM   *Please note that this is a verbal dictation therefore any spelling or grammatical errors are due to the "Dragon Medical One" system interpretation.  Please page via Amion and do not message via secure chat for urgent patient care matters. Secure chat can be used for non urgent patient care matters.  How to contact the [facility] Attending or Consulting provider 7A - 7P or covering provider during after hours 7P -7A, for this patient?  Check the care team in [facility] and look for a) attending/consulting [facility] provider listed and b) the [facility] team listed. Page or secure chat 7A-7P. Log into [email or web address] and use [facility] universal password to access. If you do not have the password, please contact the hospital operator. Locate the [facility] provider you are looking for under [facility] and page to a number that you can be directly reached. If you still have difficulty reaching the provider, please page the [facility] (Director on Call) for the Hospitalists listed on amion for assistance.

## 2023-09-26 NOTE — Progress Notes (Addendum)
Patients daughter wants patients morning Klonopin held from now on due to lethargy

## 2023-09-26 NOTE — Plan of Care (Signed)
  Problem: Clinical Measurements: Goal: Will remain free from infection Outcome: Progressing Goal: Respiratory complications will improve Outcome: Progressing   Problem: Coping: Goal: Level of anxiety will decrease Outcome: Progressing   Problem: Elimination: Goal: Will not experience complications related to urinary retention Outcome: Progressing   Problem: Safety: Goal: Ability to remain free from injury will improve Outcome: Progressing

## 2023-09-26 NOTE — Progress Notes (Addendum)
DAILY PROGRESS NOTE   Patient Name: Erika Gates Date of Encounter: 09/26/2023 Cardiologist: Donato Schultz, MD  Chief Complaint   No complaints  Patient Profile   Erika Gates is a 89 y.o. female with a hx of CAD with CABG in 1996, chronic diastolic heart failure, hypertension, HLD, type 2 DM, morbid obesity, persistent atrial fibrillation following post-op PEA arrest 07/2022, sinus bradycardia, pancytopenia, who is being seen 09/23/2023 for the evaluation of CHF at the request of Dr Jacqulyn Bath.   Subjective   No issues overnight. Diuresed another 1.3L overnight- now 8.8L Negative. Creatinine stable. H/H slightly higher.  As I was preparing to examine her she went back into afib with RVR - rates in the 120-130's. No symptoms with this.  Objective   Vitals:   09/25/23 1945 09/25/23 2337 09/26/23 0343 09/26/23 0729  BP:  132/63 122/78   Pulse:  75 82   Resp:  20 18 18   Temp:  97.6 F (36.4 C) 97.9 F (36.6 C) 98.3 F (36.8 C)  TempSrc:  Oral Oral Oral  SpO2: 100% 100% 94%   Weight:   80.9 kg   Height:        Intake/Output Summary (Last 24 hours) at 09/26/2023 0815 Last data filed at 09/25/2023 2337 Gross per 24 hour  Intake 570 ml  Output 1900 ml  Net -1330 ml   Filed Weights   09/24/23 0257 09/25/23 0311 09/26/23 0343  Weight: 85.9 kg 84.5 kg 80.9 kg    Physical Exam   General appearance: awakens to voice Lungs: clear to auscultation bilaterally Heart: irregularly irregular rhythm Extremities: edema 1-2+ bilateral LE edema Neurologic: Mental status: Grossly non-focal  Inpatient Medications    Scheduled Meds:  clonazePAM  0.5 mg Oral BID   furosemide  40 mg Intravenous BID   insulin aspart  0-15 Units Subcutaneous TID WC   leptospermum manuka honey  1 Application Topical Daily   liver oil-zinc oxide   Topical BID   losartan  25 mg Oral Daily   metoprolol succinate  25 mg Oral Daily   pantoprazole  40 mg Oral BID   rosuvastatin  20 mg Oral Daily     Continuous Infusions:   PRN Meds: acetaminophen **OR** acetaminophen, docusate sodium, ondansetron **OR** ondansetron (ZOFRAN) IV   Labs   Results for orders placed or performed during the hospital encounter of 09/22/23 (from the past 48 hours)  Glucose, capillary     Status: Abnormal   Collection Time: 09/24/23 11:14 AM  Result Value Ref Range   Glucose-Capillary 147 (H) 70 - 99 mg/dL    Comment: Glucose reference range applies only to samples taken after fasting for at least 8 hours.  Glucose, capillary     Status: Abnormal   Collection Time: 09/24/23  3:43 PM  Result Value Ref Range   Glucose-Capillary 104 (H) 70 - 99 mg/dL    Comment: Glucose reference range applies only to samples taken after fasting for at least 8 hours.  Glucose, capillary     Status: Abnormal   Collection Time: 09/24/23  9:06 PM  Result Value Ref Range   Glucose-Capillary 104 (H) 70 - 99 mg/dL    Comment: Glucose reference range applies only to samples taken after fasting for at least 8 hours.  CBC with Differential/Platelet     Status: Abnormal   Collection Time: 09/25/23  1:55 AM  Result Value Ref Range   WBC 4.3 4.0 - 10.5 K/uL   RBC 3.11 (  L) 3.87 - 5.11 MIL/uL   Hemoglobin 7.6 (L) 12.0 - 15.0 g/dL   HCT 16.1 (L) 09.6 - 04.5 %   MCV 89.1 80.0 - 100.0 fL   MCH 24.4 (L) 26.0 - 34.0 pg   MCHC 27.4 (L) 30.0 - 36.0 g/dL   RDW 40.9 (H) 81.1 - 91.4 %   Platelets 90 (L) 150 - 400 K/uL    Comment: Immature Platelet Fraction may be clinically indicated, consider ordering this additional test [id number] REPEATED TO VERIFY    nRBC 0.0 0.0 - 0.2 %   Neutrophils Relative % 72 %   Neutro Abs 3.1 1.7 - 7.7 K/uL   Lymphocytes Relative 11 %   Lymphs Abs 0.5 (L) 0.7 - 4.0 K/uL   Monocytes Relative 15 %   Monocytes Absolute 0.6 0.1 - 1.0 K/uL   Eosinophils Relative 2 %   Eosinophils Absolute 0.1 0.0 - 0.5 K/uL   Basophils Relative 0 %   Basophils Absolute 0.0 0.0 - 0.1 K/uL   Immature Granulocytes 0 %    Abs Immature Granulocytes 0.01 0.00 - 0.07 K/uL    Comment: Performed at [facility], [address], [place], [place] [address]  Basic metabolic panel     Status: Abnormal   Collection Time: 09/25/23  1:55 AM  Result Value Ref Range   Sodium 145 135 - 145 mmol/L   Potassium 4.3 3.5 - 5.1 mmol/L   Chloride 92 (L) 98 - 111 mmol/L   CO2 45 (H) 22 - 32 mmol/L   Glucose, Bld 99 70 - 99 mg/dL    Comment: Glucose reference range applies only to samples taken after fasting for at least 8 hours.   BUN 19 8 - 23 mg/dL   Creatinine, Ser 7.84 0.44 - 1.00 mg/dL   Calcium 9.6 8.9 - 69.6 mg/dL   GFR, Estimated 60 (L) >60 mL/min    Comment: (NOTE) Calculated using the CKD-EPI Creatinine Equation (2021)    Anion gap 8 5 - 15    Comment: Performed at [facility], [address], [place], [place] [address]  Glucose, capillary     Status: None   Collection Time: 09/25/23  6:06 AM  Result Value Ref Range   Glucose-Capillary 73 70 - 99 mg/dL    Comment: Glucose reference range applies only to samples taken after fasting for at least 8 hours.  Glucose, capillary     Status: None   Collection Time: 09/25/23 10:31 AM  Result Value Ref Range   Glucose-Capillary 90 70 - 99 mg/dL    Comment: Glucose reference range applies only to samples taken after fasting for at least 8 hours.  Glucose, capillary     Status: None   Collection Time: 09/25/23 11:28 AM  Result Value Ref Range   Glucose-Capillary 98 70 - 99 mg/dL    Comment: Glucose reference range applies only to samples taken after fasting for at least 8 hours.  Glucose, capillary     Status: Abnormal   Collection Time: 09/25/23  4:10 PM  Result Value Ref Range   Glucose-Capillary 130 (H) 70 - 99 mg/dL    Comment: Glucose reference range applies only to samples taken after fasting for at least 8 hours.  Glucose, capillary     Status: Abnormal   Collection Time: 09/25/23  9:09 PM  Result Value Ref Range   Glucose-Capillary 135 (H) 70  - 99 mg/dL    Comment: Glucose reference range applies only to  samples taken after fasting for at least 8 hours.  CBC with Differential/Platelet     Status: Abnormal   Collection Time: 09/26/23  2:09 AM  Result Value Ref Range   WBC 3.8 (L) 4.0 - 10.5 K/uL   RBC 3.35 (L) 3.87 - 5.11 MIL/uL   Hemoglobin 8.1 (L) 12.0 - 15.0 g/dL   HCT 54.0 (L) 98.1 - 19.1 %   MCV 90.7 80.0 - 100.0 fL   MCH 24.2 (L) 26.0 - 34.0 pg   MCHC 26.6 (L) 30.0 - 36.0 g/dL   RDW 47.8 (H) 29.5 - 62.1 %   Platelets 103 (L) 150 - 400 K/uL    Comment: REPEATED TO VERIFY   nRBC 0.0 0.0 - 0.2 %   Neutrophils Relative % 64 %   Neutro Abs 2.5 1.7 - 7.7 K/uL   Lymphocytes Relative 16 %   Lymphs Abs 0.6 (L) 0.7 - 4.0 K/uL   Monocytes Relative 17 %   Monocytes Absolute 0.6 0.1 - 1.0 K/uL   Eosinophils Relative 2 %   Eosinophils Absolute 0.1 0.0 - 0.5 K/uL   Basophils Relative 1 %   Basophils Absolute 0.0 0.0 - 0.1 K/uL   Immature Granulocytes 0 %   Abs Immature Granulocytes 0.01 0.00 - 0.07 K/uL    Comment: Performed at [facility], [address], [place], [place] [address]  Basic metabolic panel     Status: Abnormal   Collection Time: 09/26/23  2:09 AM  Result Value Ref Range   Sodium 141 135 - 145 mmol/L   Potassium 4.4 3.5 - 5.1 mmol/L   Chloride 93 (L) 98 - 111 mmol/L   CO2 42 (H) 22 - 32 mmol/L   Glucose, Bld 96 70 - 99 mg/dL    Comment: Glucose reference range applies only to samples taken after fasting for at least 8 hours.   BUN 16 8 - 23 mg/dL   Creatinine, Ser 7.84 0.44 - 1.00 mg/dL   Calcium 9.6 8.9 - 69.6 mg/dL   GFR, Estimated >29 >52 mL/min    Comment: (NOTE) Calculated using the CKD-EPI Creatinine Equation (2021)    Anion gap 6 5 - 15    Comment: Performed at [facility], [address] [place], [place] [address]  Glucose, capillary     Status: None   Collection Time: 09/26/23  6:04 AM  Result Value Ref Range   Glucose-Capillary 71 70 - 99 mg/dL    Comment: Glucose reference  range applies only to samples taken after fasting for at least 8 hours.   Comment 1 Document in Chart   Glucose, capillary     Status: None   Collection Time: 09/26/23  7:32 AM  Result Value Ref Range   Glucose-Capillary 72 70 - 99 mg/dL    Comment: Glucose reference range applies only to samples taken after fasting for at least 8 hours.    ECG   N/A  Telemetry   Afib with RVR noted this morning- Personally Reviewed   Radiology    No results found.  Cardiac Studies   N/A  Assessment   Principal Problem:   Acute on chronic diastolic CHF (congestive heart failure) (HCC) Active Problems:   HYPERCHOLESTEROLEMIA   Essential hypertension   History of coronary artery disease s/p CABG    Acute respiratory failure with hypoxia and hypercapnia (HCC)   Sinus bradycardia   Pancytopenia (HCC)   Paroxysmal atrial fibrillation (HCC)   CKD (chronic kidney disease) stage  3, GFR 30-59 ml/min (HCC)   DM (diabetes mellitus) type II controlled with renal manifestation (HCC)   Hypoglycemia   Symptomatic anemia   Plan   Ms. Skilton had good diuresis overnight- still volume overloaded. Creatinine improving with diuresis, will continue. Noted to go into afib with RVR this morning. She has a rash with diltiazem. Will therefore start amiodarone and try to get control of rate and rhythm. She has a high risk of decompensated HFpEF with afib. Thyroid studies and liver enzymes checked this admission and are normal. Continue metoprol XL 25 mg daily. With ongoing bleeding risk, angiodysplasia, pancytopenia, age, etc .. She is really not a good anticoagulation candidate - so I would advise not restarting Eliquis.   Time Spent Directly with Patient:  I have spent a total of 25 minutes with the patient reviewing hospital notes, telemetry, EKGs, labs and examining the patient as well as establishing an assessment and plan that was discussed personally with the patient.  > 50% of time was spent in direct  patient care.  Length of Stay:  LOS: 4 days   Chrystie Nose, MD, [facility], FACP  [place]  [facility]  Medical Director of the [facility]  [facility] Diplomate of the American Board of Clinical Lipidology Attending Cardiologist  Direct Dial: [phone]  Fax: [phone]  Website:  [email or web address]  Lisette Abu Alisi Lupien 09/26/2023, 8:15 AM

## 2023-09-26 NOTE — Progress Notes (Signed)
   09/26/23 2045  BiPAP/CPAP/SIPAP  BiPAP/CPAP/SIPAP Pt Type Adult  BiPAP/CPAP/SIPAP Resmed   Pt has PRN BIPAP orders, no distress noted at this time

## 2023-09-27 ENCOUNTER — Encounter (HOSPITAL_COMMUNITY): Payer: Self-pay | Admitting: Internal Medicine

## 2023-09-27 DIAGNOSIS — D61818 Other pancytopenia: Secondary | ICD-10-CM | POA: Diagnosis not present

## 2023-09-27 DIAGNOSIS — I5033 Acute on chronic diastolic (congestive) heart failure: Secondary | ICD-10-CM | POA: Diagnosis not present

## 2023-09-27 DIAGNOSIS — I480 Paroxysmal atrial fibrillation: Secondary | ICD-10-CM | POA: Diagnosis not present

## 2023-09-27 LAB — GLUCOSE, CAPILLARY
Glucose-Capillary: 82 mg/dL (ref 70–99)
Glucose-Capillary: 100 mg/dL — ABNORMAL HIGH (ref 70–99)
Glucose-Capillary: 123 mg/dL — ABNORMAL HIGH (ref 70–99)
Glucose-Capillary: 99 mg/dL (ref 70–99)

## 2023-09-27 LAB — COMPREHENSIVE METABOLIC PANEL
ALT: 13 U/L (ref 0–44)
AST: 20 U/L (ref 15–41)
Albumin: 2.6 g/dL — ABNORMAL LOW (ref 3.5–5.0)
Alkaline Phosphatase: 89 U/L (ref 38–126)
Anion gap: 8 (ref 5–15)
BUN: 21 mg/dL (ref 8–23)
CO2: 44 mmol/L — ABNORMAL HIGH (ref 22–32)
Calcium: 9.6 mg/dL (ref 8.9–10.3)
Chloride: 89 mmol/L — ABNORMAL LOW (ref 98–111)
Creatinine, Ser: 0.92 mg/dL (ref 0.44–1.00)
GFR, Estimated: >60 mL/min (ref >60)
Glucose, Bld: 123 mg/dL — ABNORMAL HIGH (ref 70–99)
Potassium: 4 mmol/L (ref 3.5–5.1)
Sodium: 141 mmol/L (ref 135–145)
Total Bilirubin: 0.4 mg/dL (ref 0.0–1.2)
Total Protein: 5.7 g/dL — ABNORMAL LOW (ref 6.5–8.1)

## 2023-09-27 LAB — CBC WITH DIFFERENTIAL/PLATELET
Abs Immature Granulocytes: 0.01 10*3/uL (ref 0.00–0.07)
Basophils Absolute: 0 10*3/uL (ref 0.0–0.1)
Basophils Relative: 0 %
Eosinophils Absolute: 0.1 10*3/uL (ref 0.0–0.5)
Eosinophils Relative: 3 %
HCT: 28.4 % — ABNORMAL LOW (ref 36.0–46.0)
Hemoglobin: 7.6 g/dL — ABNORMAL LOW (ref 12.0–15.0)
Immature Granulocytes: 0 %
Lymphocytes Relative: 16 %
Lymphs Abs: 0.7 10*3/uL (ref 0.7–4.0)
MCH: 23.8 pg — ABNORMAL LOW (ref 26.0–34.0)
MCHC: 26.8 g/dL — ABNORMAL LOW (ref 30.0–36.0)
MCV: 89 fL (ref 80.0–100.0)
Monocytes Absolute: 0.6 10*3/uL (ref 0.1–1.0)
Monocytes Relative: 14 %
Neutro Abs: 2.7 10*3/uL (ref 1.7–7.7)
Neutrophils Relative %: 67 %
Platelets: 105 10*3/uL — ABNORMAL LOW (ref 150–400)
RBC: 3.19 MIL/uL — ABNORMAL LOW (ref 3.87–5.11)
RDW: 20.8 % — ABNORMAL HIGH (ref 11.5–15.5)
WBC: 4.1 10*3/uL (ref 4.0–10.5)
nRBC: 0 % (ref 0.0–0.2)

## 2023-09-27 MED ORDER — FUROSEMIDE 40 MG PO TABS
40.0000 mg | ORAL_TABLET | Freq: Every day | ORAL | Status: DC
  Administered 2023-09-28 – 2023-09-29 (×2): 40 mg via ORAL
  Filled 2023-09-27 (×2): qty 1

## 2023-09-27 NOTE — Progress Notes (Signed)
PROGRESS NOTE    Erika Gates  ZOX:[id number] DOB: 1934-12-07 DOA: 09/22/2023 PCP: [name], MD   Brief Narrative:  Erika Gates is a 89 y.o. female with medical history significant of HTN, HLD, DM-2, PAF on Eliquis, CAD s/p CABG 1996, chronic HFpEF, PUD,  Sinus bradycardia, Pancytopenia, CKD 3a Presented with  fatigue increased SOB for the past 5 days. Primary care provider did some blood work a day prior to presentation that showed hemoglobin 7.4 and 91 platelets. Blood pressure 124/60 satting 93% on home 3 L. Patient still endorsing shortness of breath has had problems with anemia in the past has been using Eliquis. Reports no black stools no blood in stools.  Last EGD was done August 2024 which showed erosive gastropathy and nonbleeding duodenal ulcer.  Also, during prior admission, she had bradycardia with rates in 30s and TSH was borderline suggestive of sick euthyroid syndrome.  Since heart rate rebounded, beta-blocker was resumed.  Assessment & Plan:   Principal Problem:   Acute on chronic diastolic CHF (congestive heart failure) (HCC) Active Problems:   Acute respiratory failure with hypoxia and hypercapnia (HCC)   Sinus bradycardia   Pancytopenia (HCC)   Paroxysmal atrial fibrillation (HCC)   HYPERCHOLESTEROLEMIA   Essential hypertension   History of coronary artery disease s/p CABG    CKD (chronic kidney disease) stage 3, GFR 30-59 ml/min (HCC)   DM (diabetes mellitus) type II controlled with renal manifestation (HCC)   Hypoglycemia   Symptomatic anemia  Acute on chronic hypoxic respiratory failure, POA: Multifactorial, could be due to anemia as well as acute on chronic congestive heart failure with preserved ejection fraction: Uses 3 L of oxygen at baseline, required BiPAP upon arrival, has been weaned and currently on 3 L of oxygen.  Management below.  Acute on chronic congestive heart failure with preserved ejection fraction, POA: Chest x-ray shows vascular  congestion, BNP elevated but appears to be at baseline.  Had crackles upon arrival.  Recent echo done in August shows 70 to 75% ejection fraction with grade 2 diastolic dysfunction.  Appears to be taking Lasix 20 mg p.o. daily.  Started on Lasix 40 mg IV twice daily, net - 9.5 L.  Cardiology transitioned her to oral Lasix starting tomorrow.  Cardiology on board and managing.  Symptomatic acute on chronic anemia: Baseline hemoglobin appears to be between 8-8.5, came in with 8.7 which dropped to 6.8 upon admission for which she received 1 unit of PRBC transfusion, hemoglobin improved to 7.6.  GI bleeding was suspected, FOBT ordered, patient had a bowel movement on 09/23/2023 however stool was not collected.  I personally requested patient's primary RN on 09/24/2023 to make sure to collect sample to send for FOBT and patient did have another bowel movement after that but no sample collected again.  Patient underwent EGD 09/25/2023, was found to have AVM which was treated with APC, GI cleared her to resume anticoagulation however cardiology had lengthy discussion with the family and they do not believe patient is a good candidate for long-term anticoagulation due to multiple problems including source of GI is not yet clear and she also has pancytopenia so anticoagulation was not recommended.  Transition to oral Protonix, a dose of IV iron given, H&H drifting down and 7.6 today.  Check daily.  Pancytopenia: Platelets and WBCs have remained low and they are at baseline.  No indication of transfusion for platelets at the moment since it is 95.  PAF/sinus bradycardia: Had some afib yesterday  and last night - converted back to sinus, then back in afib - then had a 2 second conversion pause and has been back in sinus rhythm. Remains on amiodarone.  Cardiology plans to continue IV amiodarone and transition to oral amiodarone tomorrow.  Hypertension: Blood pressure low normal.  Hold lisinopril.  Continue Toprol-XL.  Type  2 diabetes mellitus: Appears to be on Farxiga at home.  Hemoglobin A1c only 5.4.  Does not need SSI.  Hyperlipidemia: Continue Crestor.  CKD stage IIIa: At baseline.  Hypernatremia: Improved  Hypokalemia: Resolved  DVT prophylaxis: SCDs Start: 09/23/23 0559 will avoid heparin   Code Status: Full Code  Family Communication: Daughter present at bedside.  Plan of care discussed with patient in length and he/she verbalized understanding and agreed with it.  Status is: Inpatient Remains inpatient appropriate because: On Amio drip  Estimated body mass index is 34.88 kg/m as calculated from the following:   Height as of this encounter: 5' (1.524 m).   Weight as of this encounter: 81 kg.  Pressure Injury 04/18/23 Coccyx Medial Stage 2 -  Partial thickness loss of dermis presenting as a shallow open injury with a red, pink wound bed without slough. cracked skin (Active)  04/18/23 0411  Location: Coccyx  Location Orientation: Medial  Staging: Stage 2 -  Partial thickness loss of dermis presenting as a shallow open injury with a red, pink wound bed without slough.  Wound Description (Comments): cracked skin  Present on Admission: No     Pressure Injury 09/23/23 Sacrum Stage 3 -  Full thickness tissue loss. Subcutaneous fat may be visible but bone, tendon or muscle are NOT exposed. 2 cm x 1 cm x 0.1 cm 50% yellow 50% pink moist (Active)  09/23/23 0711  Location: Sacrum  Location Orientation:   Staging: Stage 3 -  Full thickness tissue loss. Subcutaneous fat may be visible but bone, tendon or muscle are NOT exposed.  Wound Description (Comments): 2 cm x 1 cm x 0.1 cm 50% yellow 50% pink moist  Present on Admission: Yes  Dressing Type Foam - Lift dressing to assess site every shift 09/26/23 2024   Nutritional Assessment: Body mass index is 34.88 kg/m.[name] Seen by dietician.  I agree with the assessment and plan as outlined below: Nutrition Status:        . Skin Assessment: I have  examined the patient's skin and I agree with the wound assessment as performed by the wound care RN as outlined below: Pressure Injury 04/18/23 Coccyx Medial Stage 2 -  Partial thickness loss of dermis presenting as a shallow open injury with a red, pink wound bed without slough. cracked skin (Active)  04/18/23 0411  Location: Coccyx  Location Orientation: Medial  Staging: Stage 2 -  Partial thickness loss of dermis presenting as a shallow open injury with a red, pink wound bed without slough.  Wound Description (Comments): cracked skin  Present on Admission: No     Pressure Injury 09/23/23 Sacrum Stage 3 -  Full thickness tissue loss. Subcutaneous fat may be visible but bone, tendon or muscle are NOT exposed. 2 cm x 1 cm x 0.1 cm 50% yellow 50% pink moist (Active)  09/23/23 0711  Location: Sacrum  Location Orientation:   Staging: Stage 3 -  Full thickness tissue loss. Subcutaneous fat may be visible but bone, tendon or muscle are NOT exposed.  Wound Description (Comments): 2 cm x 1 cm x 0.1 cm 50% yellow 50% pink moist  Present on  Admission: Yes  Dressing Type Foam - Lift dressing to assess site every shift 09/26/23 2024    Consultants:  Cardiology  Procedures:  None  Antimicrobials:  Anti-infectives (From admission, onward)    None         Subjective: Patient seen and examined, daughter at the bedside.  Patient has no complaints.  When I saw her, she was in sinus rhythm.  Objective: Vitals:   09/26/23 1959 09/27/23 0400 09/27/23 0437 09/27/23 0719  BP: (!) 116/48 (!) 130/53  (!) 131/57  Pulse: 62 62  67  Resp: 20 17  19   Temp: 98.4 F (36.9 C) 98.4 F (36.9 C)  99.2 F (37.3 C)  TempSrc: Oral Oral  Oral  SpO2: 100% 98%  97%  Weight:   81 kg   Height:        Intake/Output Summary (Last 24 hours) at 09/27/2023 1107 Last data filed at 09/27/2023 0745 Gross per 24 hour  Intake 858.47 ml  Output 1650 ml  Net -791.53 ml   Filed Weights   09/25/23 0311 09/26/23  0343 09/27/23 0437  Weight: 84.5 kg 80.9 kg 81 kg    Examination:  General exam: Appears calm and comfortable  Respiratory system: Right basal crackles. Respiratory effort normal. Cardiovascular system: S1 & S2 heard, RRR. No JVD, murmurs, rubs, gallops or clicks. No pedal edema. Gastrointestinal system: Abdomen is nondistended, soft and nontender. No organomegaly or masses felt. Normal bowel sounds heard. Central nervous system: Alert and oriented. No focal neurological deficits. Extremities: Symmetric 5 x 5 power. Skin: No rashes, lesions or ulcers.  Psychiatry: Judgement and insight appear normal. Mood & affect appropriate.   Data Reviewed: I have personally reviewed following labs and imaging studies  CBC: Recent Labs  Lab 09/22/23 2032 09/23/23 0630 09/23/23 2235 09/24/23 0700 09/25/23 0155 09/26/23 0209 09/27/23 0832  WBC 2.3* 3.2*  --  3.6* 4.3 3.8* 4.1  NEUTROABS 1.6*  --   --  2.7 3.1 2.5 2.7  HGB 8.7* 6.8* 7.8* 7.8* 7.6* 8.1* 7.6*  HCT 34.2* 26.1* 28.0* 28.3* 27.7* 30.4* 28.4*  MCV 91.2 90.6  --  88.7 89.1 90.7 89.0  PLT 67* 95*  --  94* 90* 103* 105*   Basic Metabolic Panel: Recent Labs  Lab 09/22/23 2032 09/23/23 0630 09/24/23 0700 09/25/23 0155 09/26/23 0209 09/27/23 0832  NA 145 148* 146* 145 141 141  K 3.2* 3.6 3.3* 4.3 4.4 4.0  CL 94* 96* 93* 92* 93* 89*  CO2 39* 44* 45* 45* 42* 44*  GLUCOSE 74 85 90 99 96 123*  BUN 22 20 18 19 16 21   CREATININE 1.04* 1.03* 0.98 0.93 0.88 0.92  CALCIUM 9.7 9.5 9.6 9.6 9.6 9.6  MG 2.6* 2.4  --   --   --   --   PHOS 3.4 3.1  --   --   --   --    GFR: Estimated Creatinine Clearance: 41.4 mL/min (by C-G formula based on SCr of 0.92 mg/dL). Liver Function Tests: Recent Labs  Lab 09/22/23 1040 09/23/23 0630 09/27/23 0832  AST 28 21 20   ALT 14 13 13   ALKPHOS 102 98 89  BILITOT 0.6 0.5 0.4  PROT 6.5 6.3* 5.7*  ALBUMIN 3.1* 2.9* 2.6*   No results for input(s): "LIPASE", "AMYLASE" in the last 168 hours. No  results for input(s): "AMMONIA" in the last 168 hours. Coagulation Profile: No results for input(s): "INR", "PROTIME" in the last 168 hours. Cardiac Enzymes: Recent Labs  Lab 09/22/23 2041  CKTOTAL 27*   BNP (last 3 results) No results for input(s): "PROBNP" in the last 8760 hours. HbA1C: No results for input(s): "HGBA1C" in the last 72 hours.  CBG: Recent Labs  Lab 09/26/23 0732 09/26/23 1131 09/26/23 1606 09/26/23 2133 09/27/23 0542  GLUCAP 72 110* 99 110* 82   Lipid Profile: No results for input(s): "CHOL", "HDL", "LDLCALC", "TRIG", "CHOLHDL", "LDLDIRECT" in the last 72 hours. Thyroid Function Tests: No results for input(s): "TSH", "T4TOTAL", "FREET4", "T3FREE", "THYROIDAB" in the last 72 hours.  Anemia Panel: No results for input(s): "VITAMINB12", "FOLATE", "FERRITIN", "TIBC", "IRON", "RETICCTPCT" in the last 72 hours.  Sepsis Labs: Recent Labs  Lab 09/22/23 2032  PROCALCITON <0.10    Recent Results (from the past 240 hours)  Resp panel by RT-PCR (RSV, Flu A&B, Covid) Anterior Nasal Swab     Status: None   Collection Time: 09/22/23  8:51 PM   Specimen: Anterior Nasal Swab  Result Value Ref Range Status   SARS Coronavirus 2 by RT PCR NEGATIVE NEGATIVE Final   Influenza A by PCR NEGATIVE NEGATIVE Final   Influenza B by PCR NEGATIVE NEGATIVE Final    Comment: (NOTE) The Xpert Xpress SARS-CoV-2/FLU/RSV plus assay is intended as an aid in the diagnosis of influenza from Nasopharyngeal swab specimens and should not be used as a sole basis for treatment. Nasal washings and aspirates are unacceptable for Xpert Xpress SARS-CoV-2/FLU/RSV testing.  Fact Sheet for Patients: BloggerCourse.com  Fact Sheet for Healthcare Providers: SeriousBroker.it  This test is not yet approved or cleared by the [place] FDA and has been authorized for detection and/or diagnosis of SARS-CoV-2 by FDA under an Emergency Use  Authorization (EUA). This EUA will remain in effect (meaning this test can be used) for the duration of the COVID-19 declaration under Section 564(b)(1) of the Act, 21 U.S.C. section 360bbb-3(b)(1), unless the authorization is terminated or revoked.     Resp Syncytial Virus by PCR NEGATIVE NEGATIVE Final    Comment: (NOTE) Fact Sheet for Patients: BloggerCourse.com  Fact Sheet for Healthcare Providers: SeriousBroker.it  This test is not yet approved or cleared by the [place] FDA and has been authorized for detection and/or diagnosis of SARS-CoV-2 by FDA under an Emergency Use Authorization (EUA). This EUA will remain in effect (meaning this test can be used) for the duration of the COVID-19 declaration under Section 564(b)(1) of the Act, 21 U.S.C. section 360bbb-3(b)(1), unless the authorization is terminated or revoked.  Performed at [facility], [address], [place], [place] [address]   MRSA Next Gen by PCR, Nasal     Status: None   Collection Time: 09/23/23 11:33 AM   Specimen: Nasal Mucosa; Nasal Swab  Result Value Ref Range Status   MRSA by PCR Next Gen NOT DETECTED NOT DETECTED Final    Comment: (NOTE) The GeneXpert MRSA Assay (FDA approved for NASAL specimens only), is one component of a comprehensive MRSA colonization surveillance program. It is not intended to diagnose MRSA infection nor to guide or monitor treatment for MRSA infections. Test performance is not FDA approved in patients less than 1 years old. Performed at [facility], [address] [place], [place] [address]      Radiology Studies: No results found.   Scheduled Meds:  clonazePAM  0.5 mg Oral BID   [START ON 09/28/2023] furosemide  40 mg Oral Daily   insulin aspart  0-15 Units Subcutaneous TID WC   leptospermum manuka honey  1 Application Topical Daily   liver oil-zinc oxide   Topical BID   losartan  25 mg Oral Daily    metoprolol succinate  25 mg Oral Daily   pantoprazole  40 mg Oral BID   rosuvastatin  20 mg Oral Daily   Continuous Infusions:  amiodarone 30 mg/hr (09/27/23 0925)     LOS: 5 days   Hughie Closs, MD [facility]  09/27/2023, 11:07 AM   *Please note that this is a verbal dictation therefore any spelling or grammatical errors are due to the "Dragon Medical One" system interpretation.  Please page via Amion and do not message via secure chat for urgent patient care matters. Secure chat can be used for non urgent patient care matters.  How to contact the [facility] Attending or Consulting provider 7A - 7P or covering provider during after hours 7P -7A, for this patient?  Check the care team in [facility] and look for a) attending/consulting [facility] provider listed and b) the [facility] team listed. Page or secure chat 7A-7P. Log into [email or web address] and use [facility] universal password to access. If you do not have the password, please contact the hospital operator. Locate the [facility] provider you are looking for under [facility] and page to a number that you can be directly reached. If you still have difficulty reaching the provider, please page the [facility] (Director on Call) for the Hospitalists listed on amion for assistance.

## 2023-09-27 NOTE — Care Management Important Message (Signed)
Important Message  Patient Details  Name: Erika Gates MRN: [id number] Date of Birth: 12-Dec-1934   Important Message Given:  Yes - Medicare IM     Dorena Bodo 09/27/2023, 2:41 PM

## 2023-09-27 NOTE — Progress Notes (Signed)
Mobility Specialist Progress Note:    09/27/23 1530  Mobility  Activity Transferred from chair to bed  Level of Assistance Moderate assist, patient does 50-74% (+2)  Assistive Device Front wheel walker  Distance Ambulated (ft) 4 ft  Activity Response Tolerated well  Mobility Referral Yes  Mobility visit 1 Mobility  Mobility Specialist Start Time (ACUTE ONLY) 1530  Mobility Specialist Stop Time (ACUTE ONLY) 1540  Mobility Specialist Time Calculation (min) (ACUTE ONLY) 10 min   Pt requested to transfer back to bed. Daughter in room, available to assist. ModA+2 required to stand via RW. Tolerated well. Lying comfortably in bed with all needs met, call bell in reach.    Feliciana Rossetti Mobility Specialist Please contact via Special educational needs teacher or  Rehab office at [phone]

## 2023-09-27 NOTE — Plan of Care (Signed)
  Problem: Education: Goal: Ability to describe self-care measures that may prevent or decrease complications (Diabetes Survival Skills Education) will improve Outcome: Progressing Goal: Individualized Educational Video(s) Outcome: Progressing   Problem: Coping: Goal: Ability to adjust to condition or change in health will improve Outcome: Progressing   Problem: Fluid Volume: Goal: Ability to maintain a balanced intake and output will improve Outcome: Progressing   Problem: Health Behavior/Discharge Planning: Goal: Ability to identify and utilize available resources and services will improve Outcome: Progressing Goal: Ability to manage health-related needs will improve Outcome: Progressing   Problem: Metabolic: Goal: Ability to maintain appropriate glucose levels will improve Outcome: Progressing   Problem: Nutritional: Goal: Maintenance of adequate nutrition will improve Outcome: Progressing Goal: Progress toward achieving an optimal weight will improve Outcome: Progressing   Problem: Skin Integrity: Goal: Risk for impaired skin integrity will decrease Outcome: Progressing   Problem: Tissue Perfusion: Goal: Adequacy of tissue perfusion will improve Outcome: Progressing   Problem: Education: Goal: Knowledge of General Education information will improve Description: Including pain rating scale, medication(s)/side effects and non-pharmacologic comfort measures Outcome: Progressing   Problem: Health Behavior/Discharge Planning: Goal: Ability to manage health-related needs will improve Outcome: Progressing   Problem: Clinical Measurements: Goal: Ability to maintain clinical measurements within normal limits will improve Outcome: Progressing Goal: Will remain free from infection Outcome: Progressing Goal: Diagnostic test results will improve Outcome: Progressing Goal: Respiratory complications will improve Outcome: Progressing Goal: Cardiovascular complication will  be avoided Outcome: Progressing   Problem: Activity: Goal: Risk for activity intolerance will decrease Outcome: Progressing   Problem: Nutrition: Goal: Adequate nutrition will be maintained Outcome: Progressing   Problem: Coping: Goal: Level of anxiety will decrease Outcome: Progressing   Problem: Elimination: Goal: Will not experience complications related to bowel motility Outcome: Progressing Goal: Will not experience complications related to urinary retention Outcome: Progressing   Problem: Pain Managment: Goal: General experience of comfort will improve and/or be controlled Outcome: Progressing   Problem: Safety: Goal: Ability to remain free from injury will improve Outcome: Progressing   Problem: Skin Integrity: Goal: Risk for impaired skin integrity will decrease Outcome: Progressing   Problem: Education: Goal: Ability to demonstrate management of disease process will improve Outcome: Progressing Goal: Ability to verbalize understanding of medication therapies will improve Outcome: Progressing Goal: Individualized Educational Video(s) Outcome: Progressing

## 2023-09-27 NOTE — Progress Notes (Signed)
Pt went back into A-fib, 12-lead EKG obtained to confirm rhythm; pt resting comfortably no symptoms of CP or shortness of breath. Attempted to notify Janalyn Shy, MD.  Bari Edward, RN

## 2023-09-27 NOTE — Progress Notes (Signed)
DAILY PROGRESS NOTE   Patient Name: Erika Gates Date of Encounter: 09/27/2023 Cardiologist: Donato Schultz, MD  Chief Complaint   No complaints  Patient Profile   Erika Gates is a 89 y.o. female with a hx of CAD with CABG in 1996, chronic diastolic heart failure, hypertension, HLD, type 2 DM, morbid obesity, persistent atrial fibrillation following post-op PEA arrest 07/2022, sinus bradycardia, pancytopenia, who is being seen 09/23/2023 for the evaluation of CHF at the request of Dr Jacqulyn Bath.   Subjective   Had some afib yesterday and last night - converted back to sinus, then back in afib - then had a 2 second conversion pause and has been back in sinus rhythm. Remains on amiodarone. Diuresed about 661.  BMET stable today-  creatinine 0.92.    Objective   Vitals:   09/26/23 1959 09/27/23 0400 09/27/23 0437 09/27/23 0719  BP: (!) 116/48 (!) 130/53  (!) 131/57  Pulse: 62 62  67  Resp: 20 17  19   Temp: 98.4 F (36.9 C) 98.4 F (36.9 C)  99.2 F (37.3 C)  TempSrc: Oral Oral  Oral  SpO2: 100% 98%  97%  Weight:   81 kg   Height:        Intake/Output Summary (Last 24 hours) at 09/27/2023 1610 Last data filed at 09/27/2023 0745 Gross per 24 hour  Intake 858.47 ml  Output 1650 ml  Net -791.53 ml   Filed Weights   09/25/23 0311 09/26/23 0343 09/27/23 0437  Weight: 84.5 kg 80.9 kg 81 kg    Physical Exam   General appearance: awakens to voice Lungs: clear to auscultation bilaterally Heart: irregularly irregular rhythm Extremities: edema trace bilateral LE edema Neurologic: Mental status: Grossly non-focal  Inpatient Medications    Scheduled Meds:  clonazePAM  0.5 mg Oral BID   furosemide  40 mg Intravenous BID   insulin aspart  0-15 Units Subcutaneous TID WC   leptospermum manuka honey  1 Application Topical Daily   liver oil-zinc oxide   Topical BID   losartan  25 mg Oral Daily   metoprolol succinate  25 mg Oral Daily   pantoprazole  40 mg Oral BID    rosuvastatin  20 mg Oral Daily    Continuous Infusions:  amiodarone 30 mg/hr (09/27/23 0330)    PRN Meds: acetaminophen **OR** acetaminophen, docusate sodium, ondansetron **OR** ondansetron (ZOFRAN) IV   Labs   Results for orders placed or performed during the hospital encounter of 09/22/23 (from the past 48 hours)  Glucose, capillary     Status: None   Collection Time: 09/25/23 10:31 AM  Result Value Ref Range   Glucose-Capillary 90 70 - 99 mg/dL    Comment: Glucose reference range applies only to samples taken after fasting for at least 8 hours.  Glucose, capillary     Status: None   Collection Time: 09/25/23 11:28 AM  Result Value Ref Range   Glucose-Capillary 98 70 - 99 mg/dL    Comment: Glucose reference range applies only to samples taken after fasting for at least 8 hours.  Glucose, capillary     Status: Abnormal   Collection Time: 09/25/23  4:10 PM  Result Value Ref Range   Glucose-Capillary 130 (H) 70 - 99 mg/dL    Comment: Glucose reference range applies only to samples taken after fasting for at least 8 hours.  Glucose, capillary     Status: Abnormal   Collection Time: 09/25/23  9:09 PM  Result Value Ref  Range   Glucose-Capillary 135 (H) 70 - 99 mg/dL    Comment: Glucose reference range applies only to samples taken after fasting for at least 8 hours.  CBC with Differential/Platelet     Status: Abnormal   Collection Time: 09/26/23  2:09 AM  Result Value Ref Range   WBC 3.8 (L) 4.0 - 10.5 K/uL   RBC 3.35 (L) 3.87 - 5.11 MIL/uL   Hemoglobin 8.1 (L) 12.0 - 15.0 g/dL   HCT 29.5 (L) 62.1 - 30.8 %   MCV 90.7 80.0 - 100.0 fL   MCH 24.2 (L) 26.0 - 34.0 pg   MCHC 26.6 (L) 30.0 - 36.0 g/dL   RDW 65.7 (H) 84.6 - 96.2 %   Platelets 103 (L) 150 - 400 K/uL    Comment: REPEATED TO VERIFY   nRBC 0.0 0.0 - 0.2 %   Neutrophils Relative % 64 %   Neutro Abs 2.5 1.7 - 7.7 K/uL   Lymphocytes Relative 16 %   Lymphs Abs 0.6 (L) 0.7 - 4.0 K/uL   Monocytes Relative 17 %    Monocytes Absolute 0.6 0.1 - 1.0 K/uL   Eosinophils Relative 2 %   Eosinophils Absolute 0.1 0.0 - 0.5 K/uL   Basophils Relative 1 %   Basophils Absolute 0.0 0.0 - 0.1 K/uL   Immature Granulocytes 0 %   Abs Immature Granulocytes 0.01 0.00 - 0.07 K/uL    Comment: Performed at [facility], [address], [place], [place] [address]  Basic metabolic panel     Status: Abnormal   Collection Time: 09/26/23  2:09 AM  Result Value Ref Range   Sodium 141 135 - 145 mmol/L   Potassium 4.4 3.5 - 5.1 mmol/L   Chloride 93 (L) 98 - 111 mmol/L   CO2 42 (H) 22 - 32 mmol/L   Glucose, Bld 96 70 - 99 mg/dL    Comment: Glucose reference range applies only to samples taken after fasting for at least 8 hours.   BUN 16 8 - 23 mg/dL   Creatinine, Ser 1.32 0.44 - 1.00 mg/dL   Calcium 9.6 8.9 - 44.0 mg/dL   GFR, Estimated >10 >27 mL/min    Comment: (NOTE) Calculated using the CKD-EPI Creatinine Equation (2021)    Anion gap 6 5 - 15    Comment: Performed at [facility], [address] [place], [place] [address]  Glucose, capillary     Status: None   Collection Time: 09/26/23  6:04 AM  Result Value Ref Range   Glucose-Capillary 71 70 - 99 mg/dL    Comment: Glucose reference range applies only to samples taken after fasting for at least 8 hours.   Comment 1 Document in Chart   Glucose, capillary     Status: None   Collection Time: 09/26/23  7:32 AM  Result Value Ref Range   Glucose-Capillary 72 70 - 99 mg/dL    Comment: Glucose reference range applies only to samples taken after fasting for at least 8 hours.  Occult blood card to lab, stool RN will collect     Status: Abnormal   Collection Time: 09/26/23 10:00 AM  Result Value Ref Range   Fecal Occult Bld POSITIVE (A) NEGATIVE    Comment: Performed at [facility], [address], [place], [place] [address]  Glucose, capillary     Status: Abnormal   Collection Time: 09/26/23 11:31 AM  Result Value Ref Range   Glucose-Capillary 110  (H) 70 -  99 mg/dL    Comment: Glucose reference range applies only to samples taken after fasting for at least 8 hours.  Glucose, capillary     Status: None   Collection Time: 09/26/23  4:06 PM  Result Value Ref Range   Glucose-Capillary 99 70 - 99 mg/dL    Comment: Glucose reference range applies only to samples taken after fasting for at least 8 hours.  Glucose, capillary     Status: Abnormal   Collection Time: 09/26/23  9:33 PM  Result Value Ref Range   Glucose-Capillary 110 (H) 70 - 99 mg/dL    Comment: Glucose reference range applies only to samples taken after fasting for at least 8 hours.   Comment 1 Notify RN   Glucose, capillary     Status: None   Collection Time: 09/27/23  5:42 AM  Result Value Ref Range   Glucose-Capillary 82 70 - 99 mg/dL    Comment: Glucose reference range applies only to samples taken after fasting for at least 8 hours.    ECG   N/A  Telemetry   Afib with RVR overnight- now predominantly sinus - Personally Reviewed   Radiology    No results found.  Cardiac Studies   N/A  Assessment   Principal Problem:   Acute on chronic diastolic CHF (congestive heart failure) (HCC) Active Problems:   HYPERCHOLESTEROLEMIA   Essential hypertension   History of coronary artery disease s/p CABG    Acute respiratory failure with hypoxia and hypercapnia (HCC)   Sinus bradycardia   Pancytopenia (HCC)   Paroxysmal atrial fibrillation (HCC)   CKD (chronic kidney disease) stage 3, GFR 30-59 ml/min (HCC)   DM (diabetes mellitus) type II controlled with renal manifestation (HCC)   Hypoglycemia   Symptomatic anemia   Plan   Ms. Timoney appears near euvolemic. Will switch to oral diuretic tomorrow. Continue IV amiodarone today given breakthrough AF overnight- anticipate transition to oral amiodarone tomorrow. Hemoglobin drifting down - not on anticoagulation. FOBT has been positive.  Time Spent Directly with Patient:  I have spent a total of 25 minutes  with the patient reviewing hospital notes, telemetry, EKGs, labs and examining the patient as well as establishing an assessment and plan that was discussed personally with the patient.  > 50% of time was spent in direct patient care.  Length of Stay:  LOS: 5 days   Chrystie Nose, MD, [facility], FACP  [place]  [facility]  Medical Director of the Advanced Lipid Disorders &  Cardiovascular Risk Reduction Clinic Diplomate of the American Board of Clinical Lipidology Attending Cardiologist  Direct Dial: [phone]  Fax: [phone]  Website:  [email or web address]  Lisette Abu Laurie Lovejoy 09/27/2023, 9:05 AM

## 2023-09-27 NOTE — Progress Notes (Signed)
Pt had a 2.11 second pause & converted to NSR, attempted to notify Sundil, MD. No new orders at this time.  Bari Edward, RN

## 2023-09-27 NOTE — Progress Notes (Signed)
Physical Therapy Treatment Patient Details Name: Erika Gates MRN: [id number] DOB: [birth year] Today's Date: 09/27/2023   History of Present Illness Pt is 89 year old presented to [facility]  09/[facility]/23 for acute on chronic chf and acute on chronic respiratory failure. Pt also with symptomatic anemia and GI consult with EGD planned for 2/1. PMH - Htn, afibCAD s/p CABG, diastolic CHF, neurologic myopathy with quadriparesis requiring cervical decompression 07/2022, DM, and morbid obesity    PT Comments  Daughter present and requesting shorter walker for pt so obtained.  Patient able to progress to in room ambulation this session while on 3L O2.  Still developed SOB and LE pain/weakness needing seated rest partway to recliner.  [name]  Still feel she will benefit from HHPT with family support at home at d/c.    If plan is discharge home, recommend the following: A little help with walking and/or transfers;A little help with bathing/dressing/bathroom;Assist for transportation;Help with stairs or ramp for entrance;Assistance with cooking/housework   Can travel by private vehicle        Equipment Recommendations  None recommended by PT    Recommendations for Other Services       Precautions / Restrictions Precautions Precautions: Fall     Mobility  Bed Mobility Overal bed mobility: Needs Assistance Bed Mobility: Supine to Sit     Supine to sit: Mod assist     General bed mobility comments: pt bringing legs off EOB, assist to scoot hips and lift trunk with HHA    Transfers Overall transfer level: Needs assistance Equipment used: Rolling walker (2 wheels) Transfers: Sit to/from Stand Sit to Stand: Mod assist           General transfer comment: some lifting help    Ambulation/Gait Ambulation/Gait assistance: Mod assist, +2 safety/equipment Gait Distance (Feet): 5 Feet (x 2) Assistive device: Rolling  walker (2 wheels) Gait Pattern/deviations: Step-to pattern, Step-through pattern, Decreased stride length, Knee flexed in stance - right       General Gait Details: increased time, noted R knee buckle x 2 in stance (dtr reports bone on bone); seated rest when SOB and due to LE pain; stood to walk the rest of the way around bed to recliner   Stairs             Wheelchair Mobility     Tilt Bed    Modified Rankin (Stroke Patients Only)       Balance Overall balance assessment: Needs assistance Sitting-balance support: Feet supported Sitting balance-Leahy Scale: Fair     Standing balance support: Bilateral upper extremity supported Standing balance-Leahy Scale: Poor Standing balance comment: RW and min assist for static standing                            Cognition Arousal: Alert Behavior During Therapy: WFL for tasks assessed/performed Overall Cognitive Status: History of cognitive impairments - at baseline                                 General Comments: a little slow with following commands and needs repeated instructions/cues at time        Exercises General Exercises - Lower Extremity Long Arc Quad: AROM, Both, 10 reps, Seated Other Exercises Other Exercises: trunk flex/ext in chair x 5  General Comments General comments (skin integrity, edema, etc.): daughter present and supportive and helping to keep chair close.  Patient on 3L O2 and maintained throughout; other vitals stable.      Pertinent Vitals/Pain Pain Assessment Pain Assessment: 0-10 Pain Score: 5  Pain Location: bilateral knees Pain Descriptors / Indicators: Aching Pain Intervention(s): Monitored during session, Limited activity within patient's tolerance    Home Living                          Prior Function            PT Goals (current goals can now be found in the care plan section) Progress towards PT goals: Progressing toward goals     Frequency    Min 1X/week      PT Plan      Co-evaluation              AM-PAC PT "6 Clicks" Mobility   Outcome Measure  Help needed turning from your back to your side while in a flat bed without using bedrails?: A Lot Help needed moving from lying on your back to sitting on the side of a flat bed without using bedrails?: A Lot Help needed moving to and from a bed to a chair (including a wheelchair)?: A Lot Help needed standing up from a chair using your arms (e.g., wheelchair or bedside chair)?: A Lot Help needed to walk in hospital room?: Total Help needed climbing 3-5 steps with a railing? : Total 6 Click Score: 10    End of Session Equipment Utilized During Treatment: Gait belt;Oxygen Activity Tolerance: Patient tolerated treatment well Patient left: in chair;with call bell/phone within reach;with family/visitor present;with chair alarm set Nurse Communication: Mobility status PT Visit Diagnosis: Unsteadiness on feet (R26.81);Other abnormalities of gait and mobility (R26.89);Muscle weakness (generalized) (M62.81)     Time: 1610-9604 PT Time Calculation (min) (ACUTE ONLY): 32 min  Charges:    $Gait Training: 8-22 mins $Therapeutic Activity: 8-22 mins PT General Charges $$ ACUTE PT VISIT: 1 Visit                     Erika Gates, PT Acute Rehabilitation Services Office:[phone] 09/27/2023    Erika Gates 09/27/2023, 2:44 PM

## 2023-09-28 DIAGNOSIS — I5033 Acute on chronic diastolic (congestive) heart failure: Secondary | ICD-10-CM | POA: Diagnosis not present

## 2023-09-28 DIAGNOSIS — I480 Paroxysmal atrial fibrillation: Secondary | ICD-10-CM | POA: Diagnosis not present

## 2023-09-28 LAB — CBC
HCT: 28.4 % — ABNORMAL LOW (ref 36.0–46.0)
Hemoglobin: 7.7 g/dL — ABNORMAL LOW (ref 12.0–15.0)
MCH: 24 pg — ABNORMAL LOW (ref 26.0–34.0)
MCHC: 27.1 g/dL — ABNORMAL LOW (ref 30.0–36.0)
MCV: 88.5 fL (ref 80.0–100.0)
Platelets: 115 10*3/uL — ABNORMAL LOW (ref 150–400)
RBC: 3.21 MIL/uL — ABNORMAL LOW (ref 3.87–5.11)
RDW: 20.6 % — ABNORMAL HIGH (ref 11.5–15.5)
WBC: 4.3 10*3/uL (ref 4.0–10.5)
nRBC: 0 % (ref 0.0–0.2)

## 2023-09-28 LAB — BASIC METABOLIC PANEL
Anion gap: 8 (ref 5–15)
BUN: 22 mg/dL (ref 8–23)
CO2: 43 mmol/L — ABNORMAL HIGH (ref 22–32)
Calcium: 9.7 mg/dL (ref 8.9–10.3)
Chloride: 88 mmol/L — ABNORMAL LOW (ref 98–111)
Creatinine, Ser: 0.94 mg/dL (ref 0.44–1.00)
GFR, Estimated: 59 mL/min — ABNORMAL LOW (ref >60)
Glucose, Bld: 101 mg/dL — ABNORMAL HIGH (ref 70–99)
Potassium: 3.9 mmol/L (ref 3.5–5.1)
Sodium: 139 mmol/L (ref 135–145)

## 2023-09-28 LAB — GLUCOSE, CAPILLARY
Glucose-Capillary: 84 mg/dL (ref 70–99)
Glucose-Capillary: 120 mg/dL — ABNORMAL HIGH (ref 70–99)
Glucose-Capillary: 124 mg/dL — ABNORMAL HIGH (ref 70–99)
Glucose-Capillary: 109 mg/dL — ABNORMAL HIGH (ref 70–99)

## 2023-09-28 MED ORDER — AMIODARONE HCL 200 MG PO TABS
200.0000 mg | ORAL_TABLET | Freq: Two times a day (BID) | ORAL | Status: DC
  Administered 2023-09-28 – 2023-09-29 (×3): 200 mg via ORAL
  Filled 2023-09-28 (×3): qty 1

## 2023-09-28 NOTE — Progress Notes (Addendum)
 DAILY PROGRESS NOTE   Patient Name: Erika Gates Date of Encounter: 09/28/2023 Cardiologist: Oneil Parchment, MD  Chief Complaint   No complaints  Patient Profile   Erika Gates is a 89 y.o. female with a hx of CAD with CABG in 1996, chronic diastolic heart failure, hypertension, HLD, type 2 DM, morbid obesity, persistent atrial fibrillation following post-op PEA arrest 07/2022, sinus bradycardia, pancytopenia, who is being seen 09/23/2023 for the evaluation of CHF at the request of Dr Vernon.   Subjective   Net negative another 1.1L. Overall 10L Negative. Creatinine stable. H/H stable. Maintaining sinus rhythm on amiodarone .   Objective   Vitals:   09/27/23 1931 09/27/23 2321 09/28/23 0339 09/28/23 0718  BP: (!) 135/56 (!) 119/51 (!) 134/52 131/80  Pulse: (!) 57 (!) 56 (!) 56 83  Resp: 16 17 20 19   Temp: 97.8 F (36.6 C) 97.6 F (36.4 C) 97.6 F (36.4 C) 97.6 F (36.4 C)  TempSrc: Oral Oral Oral Oral  SpO2: 100% 95% 96% 99%  Weight:   81 kg   Height:        Intake/Output Summary (Last 24 hours) at 09/28/2023 9095 Last data filed at 09/28/2023 9273 Gross per 24 hour  Intake 427.05 ml  Output 1550 ml  Net -1122.95 ml   Filed Weights   09/26/23 0343 09/27/23 0437 09/28/23 0339  Weight: 80.9 kg 81 kg 81 kg    Physical Exam   General appearance: awakens to voice Lungs: clear to auscultation bilaterally Heart: irregularly irregular rhythm Extremities: edema trace bilateral LE edema Neurologic: Mental status: Grossly non-focal  Inpatient Medications    Scheduled Meds:  clonazePAM   0.5 mg Oral BID   furosemide   40 mg Oral Daily   insulin  aspart  0-15 Units Subcutaneous TID WC   leptospermum manuka honey  1 Application Topical Daily   liver oil-zinc  oxide   Topical BID   losartan   25 mg Oral Daily   metoprolol  succinate  25 mg Oral Daily   pantoprazole   40 mg Oral BID   rosuvastatin   20 mg Oral Daily    Continuous Infusions:  amiodarone  30 mg/hr  (09/28/23 0516)    PRN Meds: acetaminophen  **OR** acetaminophen , docusate sodium , ondansetron  **OR** ondansetron  (ZOFRAN ) IV   Labs   Results for orders placed or performed during the hospital encounter of 09/22/23 (from the past 48 hours)  Occult blood card to lab, stool RN will collect     Status: Abnormal   Collection Time: 09/26/23 10:00 AM  Result Value Ref Range   Fecal Occult Bld POSITIVE (A) NEGATIVE    Comment: Performed at [facility], [address], [place], [place] [address]  Glucose, capillary     Status: Abnormal   Collection Time: 09/26/23 11:31 AM  Result Value Ref Range   Glucose-Capillary 110 (H) 70 - 99 mg/dL    Comment: Glucose reference range applies only to samples taken after fasting for at least 8 hours.  Glucose, capillary     Status: None   Collection Time: 09/26/23  4:06 PM  Result Value Ref Range   Glucose-Capillary 99 70 - 99 mg/dL    Comment: Glucose reference range applies only to samples taken after fasting for at least 8 hours.  Glucose, capillary     Status: Abnormal   Collection Time: 09/26/23  9:33 PM  Result Value Ref Range   Glucose-Capillary 110 (H) 70 - 99 mg/dL    Comment: Glucose reference range applies  only to samples taken after fasting for at least 8 hours.   Comment 1 Notify RN   Glucose, capillary     Status: None   Collection Time: 09/27/23  5:42 AM  Result Value Ref Range   Glucose-Capillary 82 70 - 99 mg/dL    Comment: Glucose reference range applies only to samples taken after fasting for at least 8 hours.  CBC with Differential/Platelet     Status: Abnormal   Collection Time: 09/27/23  8:32 AM  Result Value Ref Range   WBC 4.1 4.0 - 10.5 K/uL   RBC 3.19 (L) 3.87 - 5.11 MIL/uL   Hemoglobin 7.6 (L) 12.0 - 15.0 g/dL   HCT 71.5 (L) 63.9 - 53.9 %   MCV 89.0 80.0 - 100.0 fL   MCH 23.8 (L) 26.0 - 34.0 pg   MCHC 26.8 (L) 30.0 - 36.0 g/dL   RDW 79.1 (H) 88.4 - 84.4 %   Platelets 105 (L) 150 - 400 K/uL   nRBC 0.0 0.0 -  0.2 %   Neutrophils Relative % 67 %   Neutro Abs 2.7 1.7 - 7.7 K/uL   Lymphocytes Relative 16 %   Lymphs Abs 0.7 0.7 - 4.0 K/uL   Monocytes Relative 14 %   Monocytes Absolute 0.6 0.1 - 1.0 K/uL   Eosinophils Relative 3 %   Eosinophils Absolute 0.1 0.0 - 0.5 K/uL   Basophils Relative 0 %   Basophils Absolute 0.0 0.0 - 0.1 K/uL   Immature Granulocytes 0 %   Abs Immature Granulocytes 0.01 0.00 - 0.07 K/uL    Comment: Performed at [facility], [address], [place], [place] [address]  Comprehensive metabolic panel     Status: Abnormal   Collection Time: 09/27/23  8:32 AM  Result Value Ref Range   Sodium 141 135 - 145 mmol/L   Potassium 4.0 3.5 - 5.1 mmol/L   Chloride 89 (L) 98 - 111 mmol/L   CO2 44 (H) 22 - 32 mmol/L   Glucose, Bld 123 (H) 70 - 99 mg/dL    Comment: Glucose reference range applies only to samples taken after fasting for at least 8 hours.   BUN 21 8 - 23 mg/dL   Creatinine, Ser 9.07 0.44 - 1.00 mg/dL   Calcium  9.6 8.9 - 10.3 mg/dL   Total Protein 5.7 (L) 6.5 - 8.1 g/dL   Albumin  2.6 (L) 3.5 - 5.0 g/dL   AST 20 15 - 41 U/L   ALT 13 0 - 44 U/L   Alkaline Phosphatase 89 38 - 126 U/L   Total Bilirubin 0.4 0.0 - 1.2 mg/dL   GFR, Estimated >39 >39 mL/min    Comment: (NOTE) Calculated using the CKD-EPI Creatinine Equation (2021)    Anion gap 8 5 - 15    Comment: Performed at [facility], [address], [place], [place] [address]  Glucose, capillary     Status: Abnormal   Collection Time: 09/27/23 11:35 AM  Result Value Ref Range   Glucose-Capillary 100 (H) 70 - 99 mg/dL    Comment: Glucose reference range applies only to samples taken after fasting for at least 8 hours.  Glucose, capillary     Status: Abnormal   Collection Time: 09/27/23  4:11 PM  Result Value Ref Range   Glucose-Capillary 123 (H) 70 - 99 mg/dL    Comment: Glucose reference range applies only to samples taken after fasting for at least 8 hours.  Glucose, capillary  Status: None    Collection Time: 09/27/23  9:05 PM  Result Value Ref Range   Glucose-Capillary 99 70 - 99 mg/dL    Comment: Glucose reference range applies only to samples taken after fasting for at least 8 hours.   Comment 1 Document in Chart   Basic metabolic panel     Status: Abnormal   Collection Time: 09/28/23  2:08 AM  Result Value Ref Range   Sodium 139 135 - 145 mmol/L   Potassium 3.9 3.5 - 5.1 mmol/L   Chloride 88 (L) 98 - 111 mmol/L   CO2 43 (H) 22 - 32 mmol/L   Glucose, Bld 101 (H) 70 - 99 mg/dL    Comment: Glucose reference range applies only to samples taken after fasting for at least 8 hours.   BUN 22 8 - 23 mg/dL   Creatinine, Ser 9.05 0.44 - 1.00 mg/dL   Calcium  9.7 8.9 - 10.3 mg/dL   GFR, Estimated 59 (L) >60 mL/min    Comment: (NOTE) Calculated using the CKD-EPI Creatinine Equation (2021)    Anion gap 8 5 - 15    Comment: Performed at [facility], [address], [place], [place] [address]  CBC     Status: Abnormal   Collection Time: 09/28/23  2:08 AM  Result Value Ref Range   WBC 4.3 4.0 - 10.5 K/uL   RBC 3.21 (L) 3.87 - 5.11 MIL/uL   Hemoglobin 7.7 (L) 12.0 - 15.0 g/dL   HCT 71.5 (L) 63.9 - 53.9 %   MCV 88.5 80.0 - 100.0 fL   MCH 24.0 (L) 26.0 - 34.0 pg   MCHC 27.1 (L) 30.0 - 36.0 g/dL   RDW 79.3 (H) 88.4 - 84.4 %   Platelets 115 (L) 150 - 400 K/uL    Comment: REPEATED TO VERIFY   nRBC 0.0 0.0 - 0.2 %    Comment: Performed at [facility], [address], [place], [place] [address]  Glucose, capillary     Status: None   Collection Time: 09/28/23  6:17 AM  Result Value Ref Range   Glucose-Capillary 84 70 - 99 mg/dL    Comment: Glucose reference range applies only to samples taken after fasting for at least 8 hours.   Comment 1 Document in Chart     ECG   N/A  Telemetry   Sinus rhythm - Personally Reviewed   Radiology    No results found.  Cardiac Studies   N/A  Assessment   Principal Problem:   Acute on chronic diastolic CHF  (congestive heart failure) (HCC) Active Problems:   HYPERCHOLESTEROLEMIA   Essential hypertension   History of coronary artery disease s/p CABG    Acute respiratory failure with hypoxia and hypercapnia (HCC)   Sinus bradycardia   Pancytopenia (HCC)   Paroxysmal atrial fibrillation (HCC)   CKD (chronic kidney disease) stage 3, GFR 30-59 ml/min (HCC)   DM (diabetes mellitus) type II controlled with renal manifestation (HCC)   Hypoglycemia   Symptomatic anemia   Plan   Ms. Detter appears near euvolemic. Switched to oral lasix  today. Will switch IV amiodarone  to oral amiodarone  200 mg BID x 7 days, then 200 mg daily thereafter. From my standpoint, likely ok to d/c.  [facility] will sign off.   Medication Recommendations:  as above Other recommendations (labs, testing, etc):  no anticoagulation Follow up as an outpatient:  Dr. Jeffrie or APP   Time Spent Directly with Patient:  I have spent a total of 25 minutes with the patient reviewing hospital notes, telemetry, EKGs, labs and examining the patient as well as establishing an assessment and plan that was discussed personally with the patient.  > 50% of time was spent in direct patient care.  Length of Stay:  LOS: 6 days   Vinie KYM Maxcy, MD, [facility], FACP  [place]  [facility]  Medical Director of the Advanced Lipid Disorders &  Cardiovascular Risk Reduction Clinic Diplomate of the American Board of Clinical Lipidology Attending Cardiologist  Direct Dial: [phone]  Fax: [phone]  Website:  [email or web address] Synda Bagent 09/28/2023, 9:04 AM

## 2023-09-28 NOTE — Progress Notes (Signed)
 Heart Failure Navigator Progress Note  Assessed for Heart & Vascular TOC clinic readiness.  Patient does not meet criteria due to EF 60-65%, has a scheduled follow up appointment on 10/19/23. .   Navigator will sign off at this time.   Stephane Haddock, BSN, Scientist, Clinical (histocompatibility And Immunogenetics) Only

## 2023-09-28 NOTE — Plan of Care (Signed)
  Problem: Education: Goal: Ability to describe self-care measures that may prevent or decrease complications (Diabetes Survival Skills Education) will improve Outcome: Progressing Goal: Individualized Educational Video(s) Outcome: Progressing   Problem: Coping: Goal: Ability to adjust to condition or change in health will improve Outcome: Progressing   Problem: Fluid Volume: Goal: Ability to maintain a balanced intake and output will improve Outcome: Progressing   Problem: Health Behavior/Discharge Planning: Goal: Ability to identify and utilize available resources and services will improve Outcome: Progressing Goal: Ability to manage health-related needs will improve Outcome: Progressing   Problem: Metabolic: Goal: Ability to maintain appropriate glucose levels will improve Outcome: Progressing   Problem: Nutritional: Goal: Maintenance of adequate nutrition will improve Outcome: Progressing Goal: Progress toward achieving an optimal weight will improve Outcome: Progressing   Problem: Skin Integrity: Goal: Risk for impaired skin integrity will decrease Outcome: Progressing   Problem: Tissue Perfusion: Goal: Adequacy of tissue perfusion will improve Outcome: Progressing   Problem: Education: Goal: Knowledge of General Education information will improve Description: Including pain rating scale, medication(s)/side effects and non-pharmacologic comfort measures Outcome: Progressing   Problem: Health Behavior/Discharge Planning: Goal: Ability to manage health-related needs will improve Outcome: Progressing   Problem: Clinical Measurements: Goal: Ability to maintain clinical measurements within normal limits will improve Outcome: Progressing Goal: Will remain free from infection Outcome: Progressing Goal: Diagnostic test results will improve Outcome: Progressing Goal: Respiratory complications will improve Outcome: Progressing Goal: Cardiovascular complication will  be avoided Outcome: Progressing   Problem: Nutrition: Goal: Adequate nutrition will be maintained Outcome: Progressing   Problem: Coping: Goal: Level of anxiety will decrease Outcome: Progressing   Problem: Elimination: Goal: Will not experience complications related to bowel motility Outcome: Progressing Goal: Will not experience complications related to urinary retention Outcome: Progressing   Problem: Pain Managment: Goal: General experience of comfort will improve and/or be controlled Outcome: Progressing   Problem: Safety: Goal: Ability to remain free from injury will improve Outcome: Progressing   Problem: Skin Integrity: Goal: Risk for impaired skin integrity will decrease Outcome: Progressing   Problem: Cardiac: Goal: Ability to achieve and maintain adequate cardiopulmonary perfusion will improve Outcome: Progressing   Problem: Cardiac: Goal: Ability to achieve and maintain adequate cardiopulmonary perfusion will improve Outcome: Progressing

## 2023-09-28 NOTE — Progress Notes (Signed)
 Occupational Therapy Treatment Patient Details Name: Erika Gates MRN: [id number] DOB: 1935/06/12 Today's Date: 09/28/2023   History of present illness Pt is 89 year old presented to [facility] on  09/22/23 for acute on chronic chf and acute on chronic respiratory failure. Pt also with symptomatic anemia and GI consult with EGD planned for 2/1. PMH - Htn, afibCAD s/p CABG, diastolic CHF, neurologic myopathy with quadriparesis requiring cervical decompression 07/2022, DM, and morbid obesity   OT comments  Pt making gradual progress towards OT goals. Session focused on EOB bathing/dressing tasks. Pt able to assist with UB ADLs with Min A and extensive assist needed for LB ADLs though daughter reports typically having to assist with LB ADLs at baseline. Pt able to stand with Mod A x 2 for safety with improved ability to take steps at bedside with Min A x 2 and no knee buckling noted this AM. Pt reporting fatigue with ADLs, deferred OOB transfer at this time but agreeable to consider transfer to recliner for a meal today. Daughter present, both hopeful to DC home with HH therapies.       If plan is discharge home, recommend the following:  Two people to help with walking and/or transfers;Assistance with cooking/housework;Assist for transportation;Help with stairs or ramp for entrance;A lot of help with bathing/dressing/bathroom   Equipment Recommendations  None recommended by OT    Recommendations for Other Services      Precautions / Restrictions Precautions Precautions: Fall Restrictions Weight Bearing Restrictions Per Provider Order: No       Mobility Bed Mobility Overal bed mobility: Needs Assistance Bed Mobility: Supine to Sit, Sit to Supine     Supine to sit: Mod assist, +2 for safety/equipment Sit to supine: Max assist, +2 for physical assistance, +2 for safety/equipment   General bed mobility comments: Able to assist LE to EOB some and scooting to EOB but assist needed to lift  trunk and bring LE fully to EOB.    Transfers Overall transfer level: Needs assistance Equipment used: Rolling walker (2 wheels) Transfers: Sit to/from Stand Sit to Stand: Mod assist, +2 safety/equipment           General transfer comment: Mod A x 2 for safety to stand from bedside with RW for ADL assist and Min A x 2 for side steps at bedside     Balance Overall balance assessment: Needs assistance Sitting-balance support: Feet supported Sitting balance-Leahy Scale: Fair     Standing balance support: Bilateral upper extremity supported Standing balance-Leahy Scale: Poor                             ADL either performed or assessed with clinical judgement   ADL Overall ADL's : Needs assistance/impaired     Grooming: Minimal assistance;Sitting;Wash/dry face;Applying deodorant Grooming Details (indicate cue type and reason): Setup to wash face EOB, Min A for assist to don deodorant with cues to alternate hands Upper Body Bathing: Minimal assistance;Sitting Upper Body Bathing Details (indicate cue type and reason): assist for back, able to bathe under arms, chest and under breasts. some assist to bathe under belly. cues to dry Lower Body Bathing: Moderate assistance;Sit to/from stand;Sitting/lateral leans;+2 for physical assistance;+2 for safety/equipment Lower Body Bathing Details (indicate cue type and reason): able to bathe upper LE and some anterior peri region. assist for lower LE and peri region in standing using RW with +2 for safety due to hx of knee buckling Upper Body  Dressing : Minimal assistance;Sitting   Lower Body Dressing: Total assistance;Sitting/lateral leans Lower Body Dressing Details (indicate cue type and reason): sock mgmt, unable to reach                    Extremity/Trunk Assessment Upper Extremity Assessment Upper Extremity Assessment: Generalized weakness;Right hand dominant;RUE deficits/detail;LUE deficits/detail RUE Deficits /  Details: impaired shoulder ROM at baseline; generalized weakness LUE Deficits / Details: impaired shoulder ROM at baseline; generalized weakness   Lower Extremity Assessment Lower Extremity Assessment: Defer to PT evaluation        Vision   Vision Assessment?: No apparent visual deficits   Perception     Praxis      Cognition Arousal: Alert Behavior During Therapy: WFL for tasks assessed/performed Overall Cognitive Status: History of cognitive impairments - at baseline                                 General Comments: a little slow with following commands and needs repeated instructions/cues at time. pleasant, benefits from sequencing and attention cues during tasks        Exercises      Shoulder Instructions       General Comments Daughter present, supportive and hands on to assist    Pertinent Vitals/ Pain       Pain Assessment Pain Assessment: Faces Faces Pain Scale: Hurts a little bit Pain Location: bilateral knees Pain Descriptors / Indicators: Grimacing, Guarding, Sore Pain Intervention(s): Monitored during session, Limited activity within patient's tolerance, Heat applied  Home Living                                          Prior Functioning/Environment              Frequency  Min 1X/week        Progress Toward Goals  OT Goals(current goals can now be found in the care plan section)  Progress towards OT goals: Progressing toward goals  Acute Rehab OT Goals Patient Stated Goal: home tomorrow hopefully OT Goal Formulation: With patient Time For Goal Achievement: 10/08/23 Potential to Achieve Goals: Good ADL Goals Pt Will Perform Upper Body Bathing: with contact guard assist;with adaptive equipment;sitting Pt Will Perform Lower Body Bathing: with min assist;with adaptive equipment;sitting/lateral leans;sit to/from stand Pt Will Perform Upper Body Dressing: with set-up;sitting Pt Will Perform Lower Body  Dressing: with min assist;with adaptive equipment;sitting/lateral leans;sit to/from stand Pt Will Transfer to Toilet: with min assist;ambulating;bedside commode Pt Will Perform Toileting - Clothing Manipulation and hygiene: with min assist;sitting/lateral leans;sit to/from stand  Plan      Co-evaluation                 AM-PAC OT 6 Clicks Daily Activity     Outcome Measure   Help from another person eating meals?: A Little Help from another person taking care of personal grooming?: A Little Help from another person toileting, which includes using toliet, bedpan, or urinal?: A Lot Help from another person bathing (including washing, rinsing, drying)?: A Lot Help from another person to put on and taking off regular upper body clothing?: A Little Help from another person to put on and taking off regular lower body clothing?: A Lot 6 Click Score: 15    End of Session Equipment Utilized During Treatment: Gait  belt;Rolling walker (2 wheels);Oxygen   OT Visit Diagnosis: Unsteadiness on feet (R26.81);Muscle weakness (generalized) (M62.81);Other (comment)   Activity Tolerance Patient tolerated treatment well   Patient Left in bed;with call bell/phone within reach;with bed alarm set;with family/visitor present   Nurse Communication Mobility status        Time: 9068-8997 OT Time Calculation (min): 31 min  Charges: OT General Charges $OT Visit: 1 Visit OT Treatments $Self Care/Home Management : 23-37 mins  Mliss NOVAK, OTR/L Acute Rehab Services Office: [phone]   Mliss Getting 09/28/2023, 10:16 AM

## 2023-09-28 NOTE — Progress Notes (Signed)
 PROGRESS NOTE    Erika Gates  FMW:[id number] DOB: 10-02-1934 DOA: 09/22/2023 PCP: Sun, Vyvyan, MD   Brief Narrative:  Erika Gates is a 89 y.o. female with medical history significant of HTN, HLD, DM-2, PAF on Eliquis , CAD s/p CABG 1996, chronic HFpEF, PUD,  Sinus bradycardia, Pancytopenia, CKD 3a Presented with  fatigue increased SOB for the past 5 days. Primary care provider did some blood work a day prior to presentation that showed hemoglobin 7.4 and 91 platelets. Blood pressure 124/60 satting 93% on home 3 L. Patient still endorsing shortness of breath has had problems with anemia in the past has been using Eliquis . Reports no black stools no blood in stools.  Last EGD was done August 2024 which showed erosive gastropathy and nonbleeding duodenal ulcer.  Also, during prior admission, she had bradycardia with rates in 30s and TSH was borderline suggestive of sick euthyroid syndrome.  Since heart rate rebounded, beta-blocker was resumed.  Details of this hospitalization as below.  Assessment & Plan:   Principal Problem:   Acute on chronic diastolic CHF (congestive heart failure) (HCC) Active Problems:   Acute respiratory failure with hypoxia and hypercapnia (HCC)   Sinus bradycardia   Pancytopenia (HCC)   Paroxysmal atrial fibrillation (HCC)   HYPERCHOLESTEROLEMIA   Essential hypertension   History of coronary artery disease s/p CABG    CKD (chronic kidney disease) stage 3, GFR 30-59 ml/min (HCC)   DM (diabetes mellitus) type II controlled with renal manifestation (HCC)   Hypoglycemia   Symptomatic anemia  Acute on chronic hypoxic respiratory failure, POA: Multifactorial, could be due to anemia as well as acute on chronic congestive heart failure with preserved ejection fraction: Uses 3 L of oxygen  at baseline, required BiPAP upon arrival, has been weaned and currently on 3 L of oxygen .  Management below.  Acute on chronic congestive heart failure with preserved ejection  fraction, POA: Chest x-ray shows vascular congestion, BNP elevated but appears to be at baseline.  Had crackles upon arrival.  Recent echo done in August shows 70 to 75% ejection fraction with grade 2 diastolic dysfunction.  Appears to be taking Lasix  20 mg p.o. daily.  Started on Lasix  40 mg IV twice daily, net - 10.6 L.  Cardiology transitioned her to oral Lasix  today.  Symptomatic acute on chronic anemia: Baseline hemoglobin appears to be between 8-8.5, came in with 8.7 which dropped to 6.8 upon admission for which she received 1 unit of PRBC transfusion, hemoglobin improved to 7.6.  GI bleeding was suspected, FOBT ordered, patient had a bowel movement on 09/23/2023 however stool was not collected.  I personally requested patient's primary RN on 09/24/2023 to make sure to collect sample to send for FOBT and patient did have another bowel movement after that but no sample collected again.  Patient underwent EGD 09/25/2023, was found to have AVM which was treated with APC, GI cleared her to resume anticoagulation however cardiology had lengthy discussion with the family and they do not believe patient is a good candidate for long-term anticoagulation due to multiple problems including source of GI is not yet clear and she also has pancytopenia so anticoagulation was not recommended.  Transitioned to oral Protonix , a dose of IV iron  given, H&H has remained stable around 7.7 today.  Pancytopenia: Platelets and WBCs have remained low and they are at baseline.  No indication of transfusion for platelets at the moment since it is 115.  PAF/sinus bradycardia: Had some afib day before yesterday,  converted back to sinus, then back in afib - then had a 2 second pause and has been back in sinus rhythm.  Was on IV amiodarone , transitioned to oral amiodarone  today by cardiology and they recommend 200 mg p.o. twice daily for 7 days followed by 200 mg p.o. daily.  Hypertension: Blood pressure low normal.  Hold lisinopril .   Continue Toprol -XL.  Type 2 diabetes mellitus: Appears to be on Farxiga  at home.  Hemoglobin A1c only 5.4.  Does not need SSI.  Hyperlipidemia: Continue Crestor .  CKD stage IIIa: At baseline.  Hypernatremia: Improved  Hypokalemia: Resolved  DVT prophylaxis: SCDs Start: 09/23/23 0559 will avoid heparin    Code Status: Full Code  Family Communication: None present at bedside when I saw her but I discussed plan of care with the daughter over the phone later on..    Status is: Inpatient Remains inpatient appropriate because: Daughter not comfortable taking her home today.  Estimated body mass index is 34.88 kg/m as calculated from the following:   Height as of this encounter: 5' (1.524 m).   Weight as of this encounter: 81 kg.  Pressure Injury 04/18/23 Coccyx Medial Stage 2 -  Partial thickness loss of dermis presenting as a shallow open injury with a red, pink wound bed without slough. cracked skin (Active)  04/18/23 0411  Location: Coccyx  Location Orientation: Medial  Staging: Stage 2 -  Partial thickness loss of dermis presenting as a shallow open injury with a red, pink wound bed without slough.  Wound Description (Comments): cracked skin  Present on Admission: No     Pressure Injury 09/23/23 Sacrum Stage 3 -  Full thickness tissue loss. Subcutaneous fat may be visible but bone, tendon or muscle are NOT exposed. 2 cm x 1 cm x 0.1 cm 50% yellow 50% pink moist (Active)  09/23/23 0711  Location: Sacrum  Location Orientation:   Staging: Stage 3 -  Full thickness tissue loss. Subcutaneous fat may be visible but bone, tendon or muscle are NOT exposed.  Wound Description (Comments): 2 cm x 1 cm x 0.1 cm 50% yellow 50% pink moist  Present on Admission: Yes  Dressing Type Foam - Lift dressing to assess site every shift;Honey 09/27/23 1931   Nutritional Assessment: Body mass index is 34.88 kg/m.SABRA Seen by dietician.  I agree with the assessment and plan as outlined below: Nutrition  Status:        . Skin Assessment: I have examined the patient's skin and I agree with the wound assessment as performed by the wound care RN as outlined below: Pressure Injury 04/18/23 Coccyx Medial Stage 2 -  Partial thickness loss of dermis presenting as a shallow open injury with a red, pink wound bed without slough. cracked skin (Active)  04/18/23 0411  Location: Coccyx  Location Orientation: Medial  Staging: Stage 2 -  Partial thickness loss of dermis presenting as a shallow open injury with a red, pink wound bed without slough.  Wound Description (Comments): cracked skin  Present on Admission: No     Pressure Injury 09/23/23 Sacrum Stage 3 -  Full thickness tissue loss. Subcutaneous fat may be visible but bone, tendon or muscle are NOT exposed. 2 cm x 1 cm x 0.1 cm 50% yellow 50% pink moist (Active)  09/23/23 0711  Location: Sacrum  Location Orientation:   Staging: Stage 3 -  Full thickness tissue loss. Subcutaneous fat may be visible but bone, tendon or muscle are NOT exposed.  Wound Description (Comments): 2  cm x 1 cm x 0.1 cm 50% yellow 50% pink moist  Present on Admission: Yes  Dressing Type Foam - Lift dressing to assess site every shift;Honey 09/27/23 1931    Consultants:  Cardiology  Procedures:  None  Antimicrobials:  Anti-infectives (From admission, onward)    None         Subjective: Patient seen and examined earlier, no family member was present.  Patient was sleepy but arousable.  Patient had no complaints, she has not endorsed any complaints since last several days.  Appears comfortable.  Later on, when I reviewed cardiology's note stating that patient can be discharged home from their perspective.  I spoke to the daughter over the phone, she told me that she was surprised to hear these recommendations of discharging today as she had personally spoken to Dr. Mona and she was told that patient will be monitored overnight and will be discharged tomorrow if  remains stable.  She is not ready to take her mother home today.  Objective: Vitals:   09/28/23 0339 09/28/23 0718 09/28/23 1110 09/28/23 1113  BP: (!) 134/52 131/80 (!) 143/61 135/64  Pulse: (!) 56 83  (!) 55  Resp: 20 19 20 16   Temp: 97.6 F (36.4 C) 97.6 F (36.4 C) 97.6 F (36.4 C)   TempSrc: Oral Oral Oral   SpO2: 96% 99%  100%  Weight: 81 kg     Height:        Intake/Output Summary (Last 24 hours) at 09/28/2023 1359 Last data filed at 09/28/2023 0726 Gross per 24 hour  Intake 427.05 ml  Output 1550 ml  Net -1122.95 ml   Filed Weights   09/26/23 0343 09/27/23 0437 09/28/23 0339  Weight: 80.9 kg 81 kg 81 kg    Examination:  General exam: Appears calm and comfortable  Respiratory system: Clear to auscultation. Respiratory effort normal. Cardiovascular system: S1 & S2 heard, RRR. No JVD, murmurs, rubs, gallops or clicks. No pedal edema. Gastrointestinal system: Abdomen is nondistended, soft and nontender. No organomegaly or masses felt. Normal bowel sounds heard. Central nervous system: Alert and oriented. No focal neurological deficits. Extremities: Symmetric 5 x 5 power. Skin: No rashes, lesions or ulcers.    Data Reviewed: I have personally reviewed following labs and imaging studies  CBC: Recent Labs  Lab 09/22/23 2032 09/23/23 0630 09/24/23 0700 09/25/23 0155 09/26/23 0209 09/27/23 0832 09/28/23 0208  WBC 2.3*   < > 3.6* 4.3 3.8* 4.1 4.3  NEUTROABS 1.6*  --  2.7 3.1 2.5 2.7  --   HGB 8.7*   < > 7.8* 7.6* 8.1* 7.6* 7.7*  HCT 34.2*   < > 28.3* 27.7* 30.4* 28.4* 28.4*  MCV 91.2   < > 88.7 89.1 90.7 89.0 88.5  PLT 67*   < > 94* 90* 103* 105* 115*   < > = values in this interval not displayed.   Basic Metabolic Panel: Recent Labs  Lab 09/22/23 2032 09/23/23 0630 09/24/23 0700 09/25/23 0155 09/26/23 0209 09/27/23 0832 09/28/23 0208  NA 145 148* 146* 145 141 141 139  K 3.2* 3.6 3.3* 4.3 4.4 4.0 3.9  CL 94* 96* 93* 92* 93* 89* 88*  CO2 39* 44* 45*  45* 42* 44* 43*  GLUCOSE 74 85 90 99 96 123* 101*  BUN 22 20 18 19 16 21 22   CREATININE 1.04* 1.03* 0.98 0.93 0.88 0.92 0.94  CALCIUM  9.7 9.5 9.6 9.6 9.6 9.6 9.7  MG 2.6* 2.4  --   --   --   --   --  PHOS 3.4 3.1  --   --   --   --   --    GFR: Estimated Creatinine Clearance: 40.5 mL/min (by C-G formula based on SCr of 0.94 mg/dL). Liver Function Tests: Recent Labs  Lab 09/22/23 1040 09/23/23 0630 09/27/23 0832  AST 28 21 20   ALT 14 13 13   ALKPHOS 102 98 89  BILITOT 0.6 0.5 0.4  PROT 6.5 6.3* 5.7*  ALBUMIN  3.1* 2.9* 2.6*   No results for input(s): LIPASE, AMYLASE in the last 168 hours. No results for input(s): AMMONIA in the last 168 hours. Coagulation Profile: No results for input(s): INR, PROTIME in the last 168 hours. Cardiac Enzymes: Recent Labs  Lab 09/22/23 2041  CKTOTAL 27*   BNP (last 3 results) No results for input(s): PROBNP in the last 8760 hours. HbA1C: No results for input(s): HGBA1C in the last 72 hours.  CBG: Recent Labs  Lab 09/27/23 1135 09/27/23 1611 09/27/23 2105 09/28/23 0617 09/28/23 1107  GLUCAP 100* 123* 99 84 120*   Lipid Profile: No results for input(s): CHOL, HDL, LDLCALC, TRIG, CHOLHDL, LDLDIRECT in the last 72 hours. Thyroid Function Tests: No results for input(s): TSH, T4TOTAL, FREET4, T3FREE, THYROIDAB in the last 72 hours.  Anemia Panel: No results for input(s): VITAMINB12, FOLATE, FERRITIN, TIBC, IRON , RETICCTPCT in the last 72 hours.  Sepsis Labs: Recent Labs  Lab 09/22/23 2032  PROCALCITON <0.10    Recent Results (from the past 240 hours)  Resp panel by RT-PCR (RSV, Flu A&B, Covid) Anterior Nasal Swab     Status: None   Collection Time: 09/22/23  8:51 PM   Specimen: Anterior Nasal Swab  Result Value Ref Range Status   SARS Coronavirus 2 by RT PCR NEGATIVE NEGATIVE Final   Influenza A by PCR NEGATIVE NEGATIVE Final   Influenza B by PCR NEGATIVE NEGATIVE Final     Comment: (NOTE) The Xpert Xpress SARS-CoV-2/FLU/RSV plus assay is intended as an aid in the diagnosis of influenza from Nasopharyngeal swab specimens and should not be used as a sole basis for treatment. Nasal washings and aspirates are unacceptable for Xpert Xpress SARS-CoV-2/FLU/RSV testing.  Fact Sheet for Patients: bloggercourse.com  Fact Sheet for Healthcare Providers: seriousbroker.it  This test is not yet approved or cleared by the [place]  FDA and has been authorized for detection and/or diagnosis of SARS-CoV-2 by FDA under an Emergency Use Authorization (EUA). This EUA will remain in effect (meaning this test can be used) for the duration of the COVID-19 declaration under Section 564(b)(1) of the Act, 21 U.S.C. section 360bbb-3(b)(1), unless the authorization is terminated or revoked.     Resp Syncytial Virus by PCR NEGATIVE NEGATIVE Final    Comment: (NOTE) Fact Sheet for Patients: bloggercourse.com  Fact Sheet for Healthcare Providers: seriousbroker.it  This test is not yet approved or cleared by the [place]  FDA and has been authorized for detection and/or diagnosis of SARS-CoV-2 by FDA under an Emergency Use Authorization (EUA). This EUA will remain in effect (meaning this test can be used) for the duration of the COVID-19 declaration under Section 564(b)(1) of the Act, 21 U.S.C. section 360bbb-3(b)(1), unless the authorization is terminated or revoked.  Performed at [facility], [address] [place], [place] [address]   MRSA Next Gen by PCR, Nasal     Status: None   Collection Time: 09/23/23 11:33 AM   Specimen: Nasal Mucosa; Nasal Swab  Result Value Ref Range Status   MRSA by PCR Next Gen NOT  DETECTED NOT DETECTED Final    Comment: (NOTE) The GeneXpert MRSA Assay (FDA approved for NASAL specimens only), is one component of a comprehensive  MRSA colonization surveillance program. It is not intended to diagnose MRSA infection nor to guide or monitor treatment for MRSA infections. Test performance is not FDA approved in patients less than 36 years old. Performed at [facility], [address] [place], [place] [address]      Radiology Studies: No results found.   Scheduled Meds:  amiodarone   200 mg Oral BID   clonazePAM   0.5 mg Oral BID   furosemide   40 mg Oral Daily   insulin  aspart  0-15 Units Subcutaneous TID WC   leptospermum manuka honey  1 Application Topical Daily   liver oil-zinc  oxide   Topical BID   losartan   25 mg Oral Daily   metoprolol  succinate  25 mg Oral Daily   pantoprazole   40 mg Oral BID   rosuvastatin   20 mg Oral Daily   Continuous Infusions:     LOS: 6 days   Fredia Skeeter, MD [facility]  09/28/2023, 1:59 PM   *Please note that this is a verbal dictation therefore any spelling or grammatical errors are due to the Dragon Medical One system interpretation.  Please page via Amion and do not message via secure chat for urgent patient care matters. Secure chat can be used for non urgent patient care matters.  How to contact the [facility] Attending or Consulting provider 7A - 7P or covering provider during after hours 7P -7A, for this patient?  Check the care team in [facility] and look for a) attending/consulting [facility] provider listed and b) the [facility] team listed. Page or secure chat 7A-7P. Log into [email or web address] and use [place]'s universal password to access. If you do not have the password, please contact the hospital operator. Locate the [facility] provider you are looking for under [facility] and page to a number that you can be directly reached. If you still have difficulty reaching the provider, please page the [facility] (Director on Call) for the Hospitalists listed on amion for assistance.

## 2023-09-29 DIAGNOSIS — D649 Anemia, unspecified: Secondary | ICD-10-CM | POA: Diagnosis not present

## 2023-09-29 DIAGNOSIS — N1832 Chronic kidney disease, stage 3b: Secondary | ICD-10-CM

## 2023-09-29 DIAGNOSIS — I480 Paroxysmal atrial fibrillation: Secondary | ICD-10-CM | POA: Diagnosis not present

## 2023-09-29 DIAGNOSIS — I5033 Acute on chronic diastolic (congestive) heart failure: Secondary | ICD-10-CM | POA: Diagnosis not present

## 2023-09-29 DIAGNOSIS — E1122 Type 2 diabetes mellitus with diabetic chronic kidney disease: Secondary | ICD-10-CM | POA: Diagnosis not present

## 2023-09-29 LAB — GLUCOSE, CAPILLARY
Glucose-Capillary: 70 mg/dL (ref 70–99)
Glucose-Capillary: 100 mg/dL — ABNORMAL HIGH (ref 70–99)

## 2023-09-29 MED ORDER — LOSARTAN POTASSIUM 25 MG PO TABS: 25.0000 mg | ORAL_TABLET | Freq: Every day | ORAL | 1 refills | Status: DC

## 2023-09-29 MED ORDER — PANTOPRAZOLE SODIUM 40 MG PO TBEC: 40.0000 mg | DELAYED_RELEASE_TABLET | Freq: Two times a day (BID) | ORAL | 3 refills | Status: AC

## 2023-09-29 MED ORDER — ASCORBIC ACID 500 MG PO TABS: 500.0000 mg | ORAL_TABLET | Freq: Every day | ORAL | 2 refills | Status: AC

## 2023-09-29 MED ORDER — POLYSACCHARIDE IRON COMPLEX 150 MG PO CAPS
150.0000 mg | ORAL_CAPSULE | Freq: Every day | ORAL | Status: DC
  Administered 2023-09-29: 150 mg via ORAL
  Filled 2023-09-29: qty 1

## 2023-09-29 MED ORDER — AMIODARONE HCL 200 MG PO TABS: ORAL_TABLET | ORAL | 1 refills | Status: DC

## 2023-09-29 MED ORDER — VITAMIN C 500 MG PO TABS
500.0000 mg | ORAL_TABLET | Freq: Every day | ORAL | Status: DC
  Administered 2023-09-29: 500 mg via ORAL
  Filled 2023-09-29: qty 1

## 2023-09-29 MED ORDER — POLYSACCHARIDE IRON COMPLEX 150 MG PO CAPS: 150.0000 mg | ORAL_CAPSULE | Freq: Every day | ORAL | 1 refills | Status: AC

## 2023-09-29 MED ORDER — METOPROLOL SUCCINATE ER 25 MG PO TB24: 25.0000 mg | ORAL_TABLET | Freq: Every day | ORAL | 1 refills | Status: DC

## 2023-09-29 MED ORDER — FUROSEMIDE 40 MG PO TABS: 40.0000 mg | ORAL_TABLET | Freq: Every day | ORAL | 1 refills | Status: DC

## 2023-09-29 NOTE — TOC Transition Note (Signed)
 Transition of Care [facility]) - Discharge Note   Patient Details  Name: Erika Gates MRN: [id number] Date of Birth: 03-21-1935  [facility] CM/SW Contact:  Roxie KANDICE Stain, RN Phone Number: 09/29/2023, 12:39 PM   Clinical Narrative:    Patient stable for discharge.  [facility] called for transport home with 02.  Cory with [facility] notified of discharge.  Cardiology apt on 10/19/23.     Final next level of care: Home w Home Health Services Barriers to Discharge: Barriers Resolved   Patient Goals and CMS Choice Patient states their goals for this hospitalization and ongoing recovery are:: return home          Discharge Placement                 home      Discharge Plan and Services Additional resources added to the After Visit Summary for     Discharge Planning Services: CM Consult Post Acute Care Choice: Home Health                    HH Arranged: PT, OT [facility] Agency: [facility] Date [facility] Agency Contacted: 09/27/23 Time HH Agency Contacted: 1527 Representative spoke with at [facility] Agency: Darleene  Social Drivers of Health (SDOH) Interventions SDOH Screenings   Food Insecurity: No Food Insecurity (09/23/2023)  Housing: Low Risk  (09/23/2023)  Transportation Needs: No Transportation Needs (09/23/2023)  Utilities: Not At Risk (09/23/2023)  Financial Resource Strain: Low Risk  (08/27/2022)   Received from [facility], [facility]  Social Connections: Moderately Isolated (09/23/2023)  Tobacco Use: Low Risk  (09/25/2023)  Health Literacy: Adequate Health Literacy (08/27/2022)   Received from [facility], [facility]     Readmission Risk Interventions    09/29/2023   12:38 PM  Readmission Risk Prevention Plan  Transportation Screening Complete  PCP or Specialist Appt within 5-7 Days Not Complete  Not Complete comments cards apt 10/19/23  Home Care Screening Complete  Medication  Review (RN CM) Complete

## 2023-09-29 NOTE — Discharge Summary (Signed)
 Physician Discharge Summary   Patient: Erika Gates MRN: [id number] DOB: 09/26/1934  Admit date:     09/22/2023  Discharge date: 09/29/23  Discharge Physician: Eric Nunnery   PCP: Sun, Vyvyan, MD   Recommendations at discharge:  Repeat CBC to follow hemoglobin trend/stability Repeat basic metabolic panel to follow ultralights renal function Reassess blood pressure and adjust antihypertensive as needed  Discharge Diagnoses: Principal Problem:   Acute on chronic diastolic CHF (congestive heart failure) (HCC) Active Problems:   Acute respiratory failure with hypoxia and hypercapnia (HCC)   Sinus bradycardia   Pancytopenia (HCC)   Paroxysmal atrial fibrillation (HCC)   HYPERCHOLESTEROLEMIA   Essential hypertension   History of coronary artery disease s/p CABG    CKD (chronic kidney disease) stage 3, GFR 30-59 ml/min (HCC)   DM (diabetes mellitus) type II controlled with renal manifestation (HCC)   Hypoglycemia   Symptomatic anemia  Brief Hospital Course: As per HPI written by Dr. Silvester on 09/22/2023 Erika Gates is a 89 y.o. female with medical history significant of HTN, HLD, DM-2, PAF on Eliquis , CAD s/p CABG 1996, chronic HFpEF, PUD,  Sinus bradycardia, Pancytopenia, CKD 3a     Presented with  fatigue increased SOB Patient was brought in with fatigue for the past 5 days Primary care provider did some blood work yesterday that showed hemoglobin 7.491 platelets Blood pressure 124/60 satting 93% on home 3 L Patient still endorsing shortness of breath has had problems with anemia in the past has been using Eliquis  Reports no black stools no blood in stools but very short of breath when walking no cough no fevers no chest pain no nausea vomiting or diarrhea   Last EGD was done August 2024 showed erosive gastropathy nonbleeding duodenal ulcer   During prior admit Reportedly heart rate in the 30s when initially presented to EMS TSH borderline-suggestive of sick euthyroid  syndrome Since heart rate rebounded-beta-blocker has been resumed without any major issues   Assessment and Plan: Acute on chronic hypoxic respiratory failure, POA:  -Multifactorial, could be due to anemia as well as acute on chronic congestive heart failure with preserved ejection fraction: Uses 3 L of oxygen  at baseline, required BiPAP upon arrival, has been weaned and currently on 3 L of oxygen .  -At discharge patient was back to home supplementation.  Patient   Acute on chronic congestive heart failure with preserved ejection fraction, POA:  -Condition is diabetes mellitus; patient 10.6 L lighter than at time of admission -Continue adjusted dose of diuretics -Discussion regarding low-sodium diet sustained with patient and daughter at bedside. -Continue to maintain adequate hydration, check weight on daily basis and keep less than 2 g of sodium intake daily. -Continue patient follow-up with cardiology service as instructed.   Symptomatic acute on chronic anemia: Baseline hemoglobin appears to be between 8-8.5, came in with 8.7 which dropped to 6.8 upon admission for which she received 1 unit of PRBC transfusion, hemoglobin improved to 7.6.  GI bleeding was suspected, FOBT ordered, patient had a bowel movement on 09/23/2023 however stool was not collected.  I personally requested patient's primary RN on 09/24/2023 to make sure to collect sample to send for FOBT and patient did have another bowel movement after that but no sample collected again.  Patient underwent EGD 09/25/2023, was found to have AVM which was treated with APC, GI cleared her to resume anticoagulation however cardiology had lengthy discussion with the family and they do not believe patient is a good candidate for  long-term anticoagulation due to multiple problems including source of GI is not yet clear and she also has pancytopenia so anticoagulation was not recommended.  Transitioned to oral Protonix , a dose of IV iron  given, H&H has  remained stable around 7.6 today.   Pancytopenia:  -Platelets and WBCs have remained low and they are at baseline.  No indication of transfusion for platelets at the moment since it is 115. -No overt bleeding. -Hemoglobin low and 1 unit PRBCs provided throughout hospitalization. -At discharge hemoglobin level around 7.7; continue the use of Mucinex  and vitamin C . -Follow CBC at follow-up visit to assess blood count stability.   PAF/sinus bradycardia:  -stable and sinus rhythm at discharge -continue b-blocker and the use of amiodarone  -patient found not to be a good candidate for anticoagulation usage and has been discontinued at discharge. -Continue to follow with cardiology service.   Hypertension:  -Blood pressure is stable -Continue GDMT medications as recommended by cardiology service for heart failure and rate control; thes meds will also stabilized her BP.   Type 2 diabetes mellitus:  -Appears to be on Farxiga  at home.  -Continue to follow modified carbohydrate diet -Continue to follow CBGs fluctuation -Most recent A1c 5.4.   Hyperlipidemia:  -Continue Crestor .   CKD stage IIIa:  -Stable-at baseline. -Continue to follow renal function trend/stability -Patient advised to maintain adequate hydration -Discharge on Lasix  40 mg daily -Repeat BMET at follow-up visit.   Hypernatremia:  -Patient advised to maintain adequate hydration and to follow low-sodium diet. -Improved/stable for discharge. -Repeat BMET to follow electrolytes.   Hypokalemia:  -Repleted and within normal limits at discharge -Continue to follow trend.    Stage II pressure injury -Localizing sacral area -Present at time of admission -Also superimposed patient -Repositioning, preventative measures and local care.   Consultants: Cardiology and GI service. Procedures performed: See below for x-ray reports. Disposition: Home Diet recommendation: Heart healthy/modified carbohydrate  diet.   DISCHARGE MEDICATION: Allergies as of 09/29/2023       Reactions   Cardizem  [diltiazem ] Rash   Glucophage [metformin] Other (See Comments)   it made me sad        Medication List     STOP taking these medications    apixaban  5 MG Tabs tablet Commonly known as: ELIQUIS    lisinopril  20 MG tablet Commonly known as: ZESTRIL        TAKE these medications    acetaminophen  325 MG tablet Commonly known as: TYLENOL  Take 650 mg by mouth 2 (two) times daily as needed for moderate pain, fever or headache.   amiodarone  200 MG tablet Commonly known as: PACERONE  Take 1 tablet by mouth twice a day for 7 days; then 1 tablet by mouth daily.   ammonium lactate 12 % lotion Commonly known as: LAC-HYDRIN Apply 1 Application topically daily as needed for dry skin.   ascorbic acid  500 MG tablet Commonly known as: VITAMIN C  Take 1 tablet (500 mg total) by mouth daily.   clonazePAM  0.5 MG tablet Commonly known as: KLONOPIN  Take 0.5 mg by mouth 2 (two) times daily.   dapagliflozin  propanediol 10 MG Tabs tablet Commonly known as: Farxiga  Take 1 tablet (10 mg total) by mouth daily before breakfast.   diclofenac  sodium 1 % Gel Commonly known as: VOLTAREN  Apply 2 g topically daily as needed (pain).   docusate sodium  100 MG capsule Commonly known as: COLACE Take 1 capsule (100 mg total) by mouth 2 (two) times daily. What changed:  when to take this reasons  to take this   furosemide  40 MG tablet Commonly known as: LASIX  Take 1 tablet (40 mg total) by mouth daily. Start taking on: September 30, 2023 What changed:  medication strength how much to take   gabapentin  100 MG capsule Commonly known as: NEURONTIN  Take 1 capsule (100 mg total) by mouth at bedtime. What changed: when to take this   iron  polysaccharides 150 MG capsule Commonly known as: NIFEREX Take 1 capsule (150 mg total) by mouth daily.   lidocaine  4 % Place 1 patch onto the skin daily as needed  (pain).   losartan  25 MG tablet Commonly known as: COZAAR  Take 1 tablet (25 mg total) by mouth daily. Start taking on: September 30, 2023   metoprolol  succinate 25 MG 24 hr tablet Commonly known as: TOPROL -XL Take 1 tablet (25 mg total) by mouth daily. Start taking on: September 30, 2023 What changed:  medication strength how much to take   Multivitamin Women 50+ Tabs Take 1 tablet by mouth daily.   pantoprazole  40 MG tablet Commonly known as: PROTONIX  Take 1 tablet (40 mg total) by mouth 2 (two) times daily. What changed: when to take this   polyethylene glycol 17 g packet Commonly known as: MIRALAX  / GLYCOLAX  Take 17 g by mouth daily as needed (constipation).   rosuvastatin  20 MG tablet Commonly known as: CRESTOR  Take 1 tablet (20 mg total) by mouth daily.   VITAMIN D -3 PO Take 1 tablet by mouth daily.   VITAMIN E PO Take 1 capsule by mouth daily.               Discharge Care Instructions  (From admission, onward)           Start     Ordered   09/29/23 0000  Discharge wound care:       Comments: Keep area clean and dry; constant repositioning.  No signs of superimposed infection.  Pressure injury present at time of admission.   09/29/23 1112            Follow-up Information     [facility] Follow up.   Specialty: Home Health Services Why: Home health has been arranged. They will contact you to schedule apt within 48hrs post discharge. Contact information: [address] [place] [address] [phone]         Parthenia Olivia HERO, PA-C Follow up.   Specialty: Cardiology Why: Tuesday Oct 19, 2023 Arrive by 10:30 AM Appt at 10:45 AM (30 min) Contact information: [address] [place] [address] [phone]                Discharge Exam: Filed Weights   09/27/23 0437 09/28/23 0339 09/29/23 0410  Weight: 81 kg 81 kg 79.9 kg   General exam: Alert, awake, following commands  appropriately.  Patient reports no chest pain, no palpitations and has stable respiratory status. Respiratory system: Good saturation on 3 L; no wheezing or crackles appreciated on exam. Cardiovascular system: Rate controlled; no rubs, no gallops, no JVD on exam. Gastrointestinal system: Abdomen is nondistended, soft and nontender. No organomegaly or masses felt. Normal bowel sounds heard. Central nervous system: Generally weak; moving 4 limbs spontaneously.  No focal neurological deficits. Extremities: No cyanosis or clubbing. Skin: No petechiae; stage II pressure injury in her sacrum present at time of admission without signs of superimposed infection. Psychiatry: Flat affect appreciated.  Condition at discharge: Stable and improved.  The results of significant diagnostics  from this hospitalization (including imaging, microbiology, ancillary and laboratory) are listed below for reference.   Imaging Studies: ECHOCARDIOGRAM COMPLETE Result Date: 09/23/2023    ECHOCARDIOGRAM REPORT   Patient Name:   Erika Gates Date of Exam: 09/23/2023 Medical Rec #:  [id number]        Height:       60.0 in Accession #:    [id number]       Weight:       206.3 lb Date of Birth:  1935-05-31        BSA:          1.892 m Patient Age:    88 years         BP:           129/54 mmHg Patient Gender: F                HR:           58 bpm. Exam Location:  Inpatient Procedure: 2D Echo, Cardiac Doppler and Color Doppler Indications:    CHF-Acute Diastolic I50.31  History:        Patient has prior history of Echocardiogram examinations, most                 recent 04/09/2023. CAD, Prior CABG; Risk Factors:Diabetes and                 Hypertension.  Sonographer:    Jayson Gaskins Referring Phys: ANASTASSIA DOUTOVA IMPRESSIONS  1. Left ventricular ejection fraction, by estimation, is 60 to 65%. The left ventricle has normal function. The left ventricle has no regional wall motion abnormalities. The left ventricular internal cavity  size was mildly dilated. Left ventricular diastolic parameters are indeterminate. Elevated left ventricular end-diastolic pressure.  2. Right ventricular systolic function is moderately reduced. The right ventricular size is severely enlarged.  3. Right atrial size was mildly dilated.  4. The mitral valve is degenerative. Moderate mitral valve regurgitation. No evidence of mitral stenosis.  5. The aortic valve is tricuspid. Aortic valve regurgitation is not visualized. Aortic valve sclerosis/calcification is present, without any evidence of aortic stenosis. Aortic valve area, by VTI measures 2.18 cm. Aortic valve mean gradient measures 5.0 mmHg. Aortic valve Vmax measures 1.48 m/s. FINDINGS  Left Ventricle: Left ventricular ejection fraction, by estimation, is 60 to 65%. The left ventricle has normal function. The left ventricle has no regional wall motion abnormalities. The left ventricular internal cavity size was mildly dilated. There is  no left ventricular hypertrophy. Left ventricular diastolic parameters are indeterminate. Elevated left ventricular end-diastolic pressure. Right Ventricle: The right ventricular size is severely enlarged. No increase in right ventricular wall thickness. Right ventricular systolic function is moderately reduced. Left Atrium: Left atrial size was normal in size. Right Atrium: Right atrial size was mildly dilated. Pericardium: There is no evidence of pericardial effusion. Mitral Valve: The mitral valve is degenerative in appearance. There is mild calcification of the mitral valve leaflet(s). Moderate mitral valve regurgitation. No evidence of mitral valve stenosis. Tricuspid Valve: The tricuspid valve is normal in structure. Tricuspid valve regurgitation is mild . No evidence of tricuspid stenosis. Aortic Valve: The aortic valve is tricuspid. Aortic valve regurgitation is not visualized. Aortic valve sclerosis/calcification is present, without any evidence of aortic stenosis.  Aortic valve mean gradient measures 5.0 mmHg. Aortic valve peak gradient measures 8.8 mmHg. Aortic valve area, by VTI measures 2.18 cm. Pulmonic Valve: The pulmonic valve was normal in structure. Pulmonic valve regurgitation is  trivial. No evidence of pulmonic stenosis. Aorta: The aortic root is normal in size and structure. Venous: The inferior vena cava was not well visualized. IAS/Shunts: No atrial level shunt detected by color flow Doppler.  LEFT VENTRICLE PLAX 2D LVIDd:         5.40 cm   Diastology LVIDs:         3.00 cm   LV e' medial:    5.55 cm/s LV PW:         0.90 cm   LV E/e' medial:  22.7 LV IVS:        0.70 cm   LV e' lateral:   7.51 cm/s LVOT diam:     1.80 cm   LV E/e' lateral: 16.8 LV SV:         78 LV SV Index:   41 LVOT Area:     2.54 cm  RIGHT VENTRICLE RV Basal diam:  4.90 cm RV Mid diam:    4.20 cm RV S prime:     8.05 cm/s TAPSE (M-mode): 1.5 cm LEFT ATRIUM             Index        RIGHT ATRIUM           Index LA Vol (A2C):   73.3 ml 38.75 ml/m  RA Area:     19.40 cm LA Vol (A4C):   49.5 ml 26.17 ml/m  RA Volume:   58.80 ml  31.09 ml/m LA Biplane Vol: 61.9 ml 32.72 ml/m  AORTIC VALVE AV Area (Vmax):    1.94 cm AV Area (Vmean):   2.13 cm AV Area (VTI):     2.18 cm AV Vmax:           148.00 cm/s AV Vmean:          99.100 cm/s AV VTI:            0.358 m AV Peak Grad:      8.8 mmHg AV Mean Grad:      5.0 mmHg LVOT Vmax:         113.00 cm/s LVOT Vmean:        82.900 cm/s LVOT VTI:          0.306 m LVOT/AV VTI ratio: 0.85  AORTA Ao Root diam: 2.90 cm MITRAL VALVE                  TRICUSPID VALVE MV Area (PHT): 3.46 cm       TR Peak grad:   55.4 mmHg MV Decel Time: 219 msec       TR Vmax:        372.00 cm/s MR Peak grad:    122.3 mmHg MR Mean grad:    90.0 mmHg    SHUNTS MR Vmax:         553.00 cm/s  Systemic VTI:  0.31 m MR Vmean:        458.0 cm/s   Systemic Diam: 1.80 cm MR PISA:         6.28 cm MR PISA Eff ROA: 43 mm MR PISA Radius:  1.00 cm MV E velocity: 126.00 cm/s MV A velocity:  125.00 cm/s MV E/A ratio:  1.01 Wilbert Bihari MD Electronically signed by Wilbert Bihari MD Signature Date/Time: 09/23/2023/10:08:55 AM    Final    DG Chest 2 View Result Date: 09/22/2023 CLINICAL DATA:  Shortness of breath and weakness for 5 days EXAM: CHEST - 2 VIEW COMPARISON:  X-ray  04/17/2023 and older FINDINGS: Status post median sternotomy. Enlarged cardiopericardial silhouette calcified aorta. Vascular congestion. Elevation of the right hemidiaphragm. Small pleural effusions. Left midlung atelectasis. Film is under penetrated. Degenerative changes noted along the spine and shoulders. Fixation hardware seen along the lower cervical spine at the edge of the imaging field. IMPRESSION: Postop chest with enlarged heart and vascular congestion. Small effusions. Right greater than left. Under penetrated radiograph Electronically Signed   By: Ranell Bring M.D.   On: 09/22/2023 17:08    Microbiology: Results for orders placed or performed during the hospital encounter of 09/22/23  Resp panel by RT-PCR (RSV, Flu A&B, Covid) Anterior Nasal Swab     Status: None   Collection Time: 09/22/23  8:51 PM   Specimen: Anterior Nasal Swab  Result Value Ref Range Status   SARS Coronavirus 2 by RT PCR NEGATIVE NEGATIVE Final   Influenza A by PCR NEGATIVE NEGATIVE Final   Influenza B by PCR NEGATIVE NEGATIVE Final    Comment: (NOTE) The Xpert Xpress SARS-CoV-2/FLU/RSV plus assay is intended as an aid in the diagnosis of influenza from Nasopharyngeal swab specimens and should not be used as a sole basis for treatment. Nasal washings and aspirates are unacceptable for Xpert Xpress SARS-CoV-2/FLU/RSV testing.  Fact Sheet for Patients: bloggercourse.com  Fact Sheet for Healthcare Providers: seriousbroker.it  This test is not yet approved or cleared by the [place]  FDA and has been authorized for detection and/or diagnosis of SARS-CoV-2 by FDA under an  Emergency Use Authorization (EUA). This EUA will remain in effect (meaning this test can be used) for the duration of the COVID-19 declaration under Section 564(b)(1) of the Act, 21 U.S.C. section 360bbb-3(b)(1), unless the authorization is terminated or revoked.     Resp Syncytial Virus by PCR NEGATIVE NEGATIVE Final    Comment: (NOTE) Fact Sheet for Patients: bloggercourse.com  Fact Sheet for Healthcare Providers: seriousbroker.it  This test is not yet approved or cleared by the [place]  FDA and has been authorized for detection and/or diagnosis of SARS-CoV-2 by FDA under an Emergency Use Authorization (EUA). This EUA will remain in effect (meaning this test can be used) for the duration of the COVID-19 declaration under Section 564(b)(1) of the Act, 21 U.S.C. section 360bbb-3(b)(1), unless the authorization is terminated or revoked.  Performed at [facility], [address] [place], [place] [address]   MRSA Next Gen by PCR, Nasal     Status: None   Collection Time: 09/23/23 11:33 AM   Specimen: Nasal Mucosa; Nasal Swab  Result Value Ref Range Status   MRSA by PCR Next Gen NOT DETECTED NOT DETECTED Final    Comment: (NOTE) The GeneXpert MRSA Assay (FDA approved for NASAL specimens only), is one component of a comprehensive MRSA colonization surveillance program. It is not intended to diagnose MRSA infection nor to guide or monitor treatment for MRSA infections. Test performance is not FDA approved in patients less than 61 years old. Performed at [facility], [address], [place], [place] [address]     Labs: CBC: Recent Labs  Lab 09/22/23 2032 09/23/23 0630 09/24/23 0700 09/25/23 0155 09/26/23 0209 09/27/23 0832 09/28/23 0208  WBC 2.3*   < > 3.6* 4.3 3.8* 4.1 4.3  NEUTROABS 1.6*  --  2.7 3.1 2.5 2.7  --   HGB 8.7*   < > 7.8* 7.6* 8.1* 7.6* 7.7*  HCT 34.2*   < > 28.3* 27.7* 30.4* 28.4*  28.4*  MCV 91.2   < >  88.7 89.1 90.7 89.0 88.5  PLT 67*   < > 94* 90* 103* 105* 115*   < > = values in this interval not displayed.   Basic Metabolic Panel: Recent Labs  Lab 09/22/23 2032 09/23/23 0630 09/24/23 0700 09/25/23 0155 09/26/23 0209 09/27/23 0832 09/28/23 0208  NA 145 148* 146* 145 141 141 139  K 3.2* 3.6 3.3* 4.3 4.4 4.0 3.9  CL 94* 96* 93* 92* 93* 89* 88*  CO2 39* 44* 45* 45* 42* 44* 43*  GLUCOSE 74 85 90 99 96 123* 101*  BUN 22 20 18 19 16 21 22   CREATININE 1.04* 1.03* 0.98 0.93 0.88 0.92 0.94  CALCIUM  9.7 9.5 9.6 9.6 9.6 9.6 9.7  MG 2.6* 2.4  --   --   --   --   --   PHOS 3.4 3.1  --   --   --   --   --    Liver Function Tests: Recent Labs  Lab 09/23/23 0630 09/27/23 0832  AST 21 20  ALT 13 13  ALKPHOS 98 89  BILITOT 0.5 0.4  PROT 6.3* 5.7*  ALBUMIN  2.9* 2.6*   CBG: Recent Labs  Lab 09/28/23 1107 09/28/23 1555 09/28/23 2109 09/29/23 0605 09/29/23 1106  GLUCAP 120* 124* 109* 70 100*    Discharge time spent: greater than 30 minutes.  Signed: Eric Nunnery, MD [facility] 09/29/2023

## 2023-10-02 DIAGNOSIS — I480 Paroxysmal atrial fibrillation: Secondary | ICD-10-CM | POA: Diagnosis not present

## 2023-10-02 DIAGNOSIS — E7800 Pure hypercholesterolemia, unspecified: Secondary | ICD-10-CM | POA: Diagnosis not present

## 2023-10-02 DIAGNOSIS — E1122 Type 2 diabetes mellitus with diabetic chronic kidney disease: Secondary | ICD-10-CM | POA: Diagnosis not present

## 2023-10-02 DIAGNOSIS — I2510 Atherosclerotic heart disease of native coronary artery without angina pectoris: Secondary | ICD-10-CM | POA: Diagnosis not present

## 2023-10-02 DIAGNOSIS — N1831 Chronic kidney disease, stage 3a: Secondary | ICD-10-CM | POA: Diagnosis not present

## 2023-10-02 DIAGNOSIS — L89153 Pressure ulcer of sacral region, stage 3: Secondary | ICD-10-CM | POA: Diagnosis not present

## 2023-10-02 DIAGNOSIS — I5033 Acute on chronic diastolic (congestive) heart failure: Secondary | ICD-10-CM | POA: Diagnosis not present

## 2023-10-02 DIAGNOSIS — I110 Hypertensive heart disease with heart failure: Secondary | ICD-10-CM | POA: Diagnosis not present

## 2023-10-02 DIAGNOSIS — D631 Anemia in chronic kidney disease: Secondary | ICD-10-CM | POA: Diagnosis not present

## 2023-10-06 DIAGNOSIS — N1831 Chronic kidney disease, stage 3a: Secondary | ICD-10-CM | POA: Diagnosis not present

## 2023-10-06 DIAGNOSIS — I5033 Acute on chronic diastolic (congestive) heart failure: Secondary | ICD-10-CM | POA: Diagnosis not present

## 2023-10-06 DIAGNOSIS — I480 Paroxysmal atrial fibrillation: Secondary | ICD-10-CM | POA: Diagnosis not present

## 2023-10-06 DIAGNOSIS — I2510 Atherosclerotic heart disease of native coronary artery without angina pectoris: Secondary | ICD-10-CM | POA: Diagnosis not present

## 2023-10-06 DIAGNOSIS — I110 Hypertensive heart disease with heart failure: Secondary | ICD-10-CM | POA: Diagnosis not present

## 2023-10-06 DIAGNOSIS — D631 Anemia in chronic kidney disease: Secondary | ICD-10-CM | POA: Diagnosis not present

## 2023-10-06 DIAGNOSIS — E7800 Pure hypercholesterolemia, unspecified: Secondary | ICD-10-CM | POA: Diagnosis not present

## 2023-10-06 DIAGNOSIS — E1122 Type 2 diabetes mellitus with diabetic chronic kidney disease: Secondary | ICD-10-CM | POA: Diagnosis not present

## 2023-10-06 DIAGNOSIS — L89153 Pressure ulcer of sacral region, stage 3: Secondary | ICD-10-CM | POA: Diagnosis not present

## 2023-10-08 DIAGNOSIS — I110 Hypertensive heart disease with heart failure: Secondary | ICD-10-CM | POA: Diagnosis not present

## 2023-10-08 DIAGNOSIS — I2510 Atherosclerotic heart disease of native coronary artery without angina pectoris: Secondary | ICD-10-CM | POA: Diagnosis not present

## 2023-10-08 DIAGNOSIS — D631 Anemia in chronic kidney disease: Secondary | ICD-10-CM | POA: Diagnosis not present

## 2023-10-08 DIAGNOSIS — E7800 Pure hypercholesterolemia, unspecified: Secondary | ICD-10-CM | POA: Diagnosis not present

## 2023-10-08 DIAGNOSIS — L89153 Pressure ulcer of sacral region, stage 3: Secondary | ICD-10-CM | POA: Diagnosis not present

## 2023-10-08 DIAGNOSIS — I480 Paroxysmal atrial fibrillation: Secondary | ICD-10-CM | POA: Diagnosis not present

## 2023-10-08 DIAGNOSIS — N1831 Chronic kidney disease, stage 3a: Secondary | ICD-10-CM | POA: Diagnosis not present

## 2023-10-08 DIAGNOSIS — I5033 Acute on chronic diastolic (congestive) heart failure: Secondary | ICD-10-CM | POA: Diagnosis not present

## 2023-10-08 DIAGNOSIS — E1122 Type 2 diabetes mellitus with diabetic chronic kidney disease: Secondary | ICD-10-CM | POA: Diagnosis not present

## 2023-10-08 NOTE — Progress Notes (Signed)
 Cardiology Office Note:  .   Date:  10/19/2023  ID:  Erika Gates, DOB [birth year], MRN [id number] PCP: [name], MD  [facility] Providers Cardiologist:  Donato Schultz, MD    History of Present Illness: [name]   Erika Gates is a 89 y.o. female  with a hx of CAD with CABG in 1996, chronic diastolic heart failure, hypertension, HLD, type 2 DM, morbid obesity, persistent atrial fibrillation following post-op PEA arrest 07/2022, sinus bradycardia, pancytopenia.  Patient was seen in the hospital for acute CHF, also in/out of Afib with 2 second conversion pause on amio. Patient comes in with her daughter and friend. She's in a wheelchair and O2. She has DOE with activity but better since hospitalization. Doing some PT. No palpitations or other cardiac complaints.   ROS:    Studies Reviewed: [name]         Prior CV Studies:    Echo 09/23/23  IMPRESSIONS     1. Left ventricular ejection fraction, by estimation, is 60 to 65%. The  left ventricle has normal function. The left ventricle has no regional  wall motion abnormalities. The left ventricular internal cavity size was  mildly dilated. Left ventricular  diastolic parameters are indeterminate. Elevated left ventricular  end-diastolic pressure.   2. Right ventricular systolic function is moderately reduced. The right  ventricular size is severely enlarged.   3. Right atrial size was mildly dilated.   4. The mitral valve is degenerative. Moderate mitral valve regurgitation.  No evidence of mitral stenosis.   5. The aortic valve is tricuspid. Aortic valve regurgitation is not  visualized. Aortic valve sclerosis/calcification is present, without any  evidence of aortic stenosis. Aortic valve area, by VTI measures 2.18 cm.  Aortic valve mean gradient measures  5.0 mmHg. Aortic valve Vmax measures 1.48 m/s.      Risk Assessment/Calculations:    CHA2DS2-VASc Score = 7   This indicates a 11.2% annual risk of stroke. The  patient's score is based upon: CHF History: 1 HTN History: 1 Diabetes History: 1 Stroke History: 0 Vascular Disease History: 1 Age Score: 2 Gender Score: 1            Physical Exam:   VS:  BP (!) 138/58   Pulse 60   Ht 5' (1.524 m)   Wt 166 lb (75.3 kg)   SpO2 99%   BMI 32.42 kg/m    Wt Readings from Last 3 Encounters:  10/19/23 166 lb (75.3 kg)  09/29/23 176 lb 2.4 oz (79.9 kg)  04/22/23 206 lb 5.6 oz (93.6 kg)    GEN: Well nourished, well developed in no acute distress NECK: No JVD; No carotid bruits CARDIAC:  RRR, no murmurs, rubs, gallops RESPIRATORY:  Clear to auscultation without rales, wheezing or rhonchi  ABDOMEN: Soft, non-tender, non-distended EXTREMITIES:  No edema; No deformity   ASSESSMENT AND PLAN: .    Chronic diastolic CHF-recent hospitalizaation with 10L diuresis. Weight down and feeling well. Continue lasix. Will check bmet and cbc  CAD s/p CABG 1996-no angina  HTN well controlled on losartan and toprol  HLD on crestor, LDL 62 08/2023  PAF/sinus bradycardia currently regular rhythm on amiodarone and toprol. not on anticoag due to anemia/GI bleed. Check EKG next OV.  CKD stage 3 Crt 0.94 09/28/23 recheck today  DM2 per PCP A1C 5.2 08/2023  Chronic anemia s/p transfusion, GI follow up soon. Check CBC.        Dispo: f/u 2-3 months.  Signed,  Jacolyn Reedy, PA-C

## 2023-10-12 DIAGNOSIS — I110 Hypertensive heart disease with heart failure: Secondary | ICD-10-CM | POA: Diagnosis not present

## 2023-10-12 DIAGNOSIS — L89153 Pressure ulcer of sacral region, stage 3: Secondary | ICD-10-CM | POA: Diagnosis not present

## 2023-10-12 DIAGNOSIS — I5033 Acute on chronic diastolic (congestive) heart failure: Secondary | ICD-10-CM | POA: Diagnosis not present

## 2023-10-12 DIAGNOSIS — D631 Anemia in chronic kidney disease: Secondary | ICD-10-CM | POA: Diagnosis not present

## 2023-10-12 DIAGNOSIS — I480 Paroxysmal atrial fibrillation: Secondary | ICD-10-CM | POA: Diagnosis not present

## 2023-10-12 DIAGNOSIS — N1831 Chronic kidney disease, stage 3a: Secondary | ICD-10-CM | POA: Diagnosis not present

## 2023-10-12 DIAGNOSIS — I2510 Atherosclerotic heart disease of native coronary artery without angina pectoris: Secondary | ICD-10-CM | POA: Diagnosis not present

## 2023-10-12 DIAGNOSIS — E1122 Type 2 diabetes mellitus with diabetic chronic kidney disease: Secondary | ICD-10-CM | POA: Diagnosis not present

## 2023-10-12 DIAGNOSIS — E7800 Pure hypercholesterolemia, unspecified: Secondary | ICD-10-CM | POA: Diagnosis not present

## 2023-10-14 DIAGNOSIS — I110 Hypertensive heart disease with heart failure: Secondary | ICD-10-CM | POA: Diagnosis not present

## 2023-10-14 DIAGNOSIS — E1122 Type 2 diabetes mellitus with diabetic chronic kidney disease: Secondary | ICD-10-CM | POA: Diagnosis not present

## 2023-10-14 DIAGNOSIS — I5033 Acute on chronic diastolic (congestive) heart failure: Secondary | ICD-10-CM | POA: Diagnosis not present

## 2023-10-14 DIAGNOSIS — I2510 Atherosclerotic heart disease of native coronary artery without angina pectoris: Secondary | ICD-10-CM | POA: Diagnosis not present

## 2023-10-14 DIAGNOSIS — D631 Anemia in chronic kidney disease: Secondary | ICD-10-CM | POA: Diagnosis not present

## 2023-10-14 DIAGNOSIS — L89153 Pressure ulcer of sacral region, stage 3: Secondary | ICD-10-CM | POA: Diagnosis not present

## 2023-10-14 DIAGNOSIS — I480 Paroxysmal atrial fibrillation: Secondary | ICD-10-CM | POA: Diagnosis not present

## 2023-10-14 DIAGNOSIS — N1831 Chronic kidney disease, stage 3a: Secondary | ICD-10-CM | POA: Diagnosis not present

## 2023-10-14 DIAGNOSIS — E7800 Pure hypercholesterolemia, unspecified: Secondary | ICD-10-CM | POA: Diagnosis not present

## 2023-10-19 ENCOUNTER — Encounter: Payer: Self-pay | Admitting: Physician Assistant

## 2023-10-19 ENCOUNTER — Ambulatory Visit: Payer: Medicare PPO | Attending: Physician Assistant | Admitting: Physician Assistant

## 2023-10-19 VITALS — BP 138/58 | HR 60 | Ht 60.0 in | Wt 166.0 lb

## 2023-10-19 DIAGNOSIS — Z7901 Long term (current) use of anticoagulants: Secondary | ICD-10-CM | POA: Diagnosis not present

## 2023-10-19 DIAGNOSIS — I10 Essential (primary) hypertension: Secondary | ICD-10-CM | POA: Diagnosis not present

## 2023-10-19 DIAGNOSIS — I5032 Chronic diastolic (congestive) heart failure: Secondary | ICD-10-CM

## 2023-10-19 DIAGNOSIS — I2510 Atherosclerotic heart disease of native coronary artery without angina pectoris: Secondary | ICD-10-CM

## 2023-10-19 DIAGNOSIS — N183 Chronic kidney disease, stage 3 unspecified: Secondary | ICD-10-CM

## 2023-10-19 DIAGNOSIS — E785 Hyperlipidemia, unspecified: Secondary | ICD-10-CM

## 2023-10-19 DIAGNOSIS — N1832 Chronic kidney disease, stage 3b: Secondary | ICD-10-CM

## 2023-10-19 DIAGNOSIS — E1122 Type 2 diabetes mellitus with diabetic chronic kidney disease: Secondary | ICD-10-CM

## 2023-10-19 DIAGNOSIS — I4819 Other persistent atrial fibrillation: Secondary | ICD-10-CM | POA: Diagnosis not present

## 2023-10-19 DIAGNOSIS — Z862 Personal history of diseases of the blood and blood-forming organs and certain disorders involving the immune mechanism: Secondary | ICD-10-CM | POA: Diagnosis not present

## 2023-10-19 DIAGNOSIS — I480 Paroxysmal atrial fibrillation: Secondary | ICD-10-CM | POA: Diagnosis not present

## 2023-10-19 NOTE — Patient Instructions (Addendum)
 Medication Instructions:  No changes *If you need a refill on your cardiac medications before your next appointment, please call your pharmacy*   Lab Work: Today: go to [facility] for blood work: Automotive engineer, bmet  If you have labs (blood work) drawn today and your tests are completely normal, you will receive your results only by: [employer] (if you have MyChart) OR A paper copy in the mail If you have any lab test that is abnormal or we need to change your treatment, we will call you to review the results.   Testing/Procedures: none   Follow-Up: At [facility], you and your health needs are our priority.  As part of our continuing mission to provide you with exceptional heart care, we have created designated Provider Care Teams.  These Care Teams include your primary Cardiologist (physician) and Advanced Practice Providers (APPs -  Physician Assistants and Nurse Practitioners) who all work together to provide you with the care you need, when you need it.  We recommend signing up for the patient portal called "MyChart".  Sign up information is provided on this After Visit Summary.  MyChart is used to connect with patients for Virtual Visits (Telemedicine).  Patients are able to view lab/test results, encounter notes, upcoming appointments, etc.  Non-urgent messages can be sent to your provider as well.   To learn more about what you can do with MyChart, go to [email or web address].    Your next appointment:   2-3 month(s)  Provider:   Donato Schultz, MD

## 2023-10-20 DIAGNOSIS — I480 Paroxysmal atrial fibrillation: Secondary | ICD-10-CM | POA: Diagnosis not present

## 2023-10-20 DIAGNOSIS — E7800 Pure hypercholesterolemia, unspecified: Secondary | ICD-10-CM | POA: Diagnosis not present

## 2023-10-20 DIAGNOSIS — I2510 Atherosclerotic heart disease of native coronary artery without angina pectoris: Secondary | ICD-10-CM | POA: Diagnosis not present

## 2023-10-20 DIAGNOSIS — D631 Anemia in chronic kidney disease: Secondary | ICD-10-CM | POA: Diagnosis not present

## 2023-10-20 DIAGNOSIS — E1122 Type 2 diabetes mellitus with diabetic chronic kidney disease: Secondary | ICD-10-CM | POA: Diagnosis not present

## 2023-10-20 DIAGNOSIS — N1831 Chronic kidney disease, stage 3a: Secondary | ICD-10-CM | POA: Diagnosis not present

## 2023-10-20 DIAGNOSIS — I5033 Acute on chronic diastolic (congestive) heart failure: Secondary | ICD-10-CM | POA: Diagnosis not present

## 2023-10-20 DIAGNOSIS — I110 Hypertensive heart disease with heart failure: Secondary | ICD-10-CM | POA: Diagnosis not present

## 2023-10-20 DIAGNOSIS — L89153 Pressure ulcer of sacral region, stage 3: Secondary | ICD-10-CM | POA: Diagnosis not present

## 2023-10-20 LAB — BASIC METABOLIC PANEL
BUN/Creatinine Ratio: 20 (ref 12–28)
BUN: 19 mg/dL (ref 8–27)
CO2: 32 mmol/L — ABNORMAL HIGH (ref 20–29)
Calcium: 10.1 mg/dL (ref 8.7–10.3)
Chloride: 101 mmol/L (ref 96–106)
Creatinine, Ser: 0.94 mg/dL (ref 0.57–1.00)
Glucose: 115 mg/dL — ABNORMAL HIGH (ref 70–99)
Potassium: 3.9 mmol/L (ref 3.5–5.2)
Sodium: 144 mmol/L (ref 134–144)
eGFR: 59 mL/min/{1.73_m2} — ABNORMAL LOW (ref >59)

## 2023-10-20 LAB — CBC
Hematocrit: 32.4 % — ABNORMAL LOW (ref 34.0–46.6)
Hemoglobin: 9.4 g/dL — ABNORMAL LOW (ref 11.1–15.9)
MCH: 24.8 pg — ABNORMAL LOW (ref 26.6–33.0)
MCHC: 29 g/dL — ABNORMAL LOW (ref 31.5–35.7)
MCV: 86 fL (ref 79–97)
Platelets: 149 10*3/uL — ABNORMAL LOW (ref 150–450)
RBC: 3.79 x10E6/uL (ref 3.77–5.28)
RDW: 18.3 % — ABNORMAL HIGH (ref 11.7–15.4)
WBC: 4.1 10*3/uL (ref 3.4–10.8)

## 2023-10-22 ENCOUNTER — Telehealth: Payer: Self-pay | Admitting: Cardiology

## 2023-10-22 DIAGNOSIS — I5033 Acute on chronic diastolic (congestive) heart failure: Secondary | ICD-10-CM | POA: Diagnosis not present

## 2023-10-22 DIAGNOSIS — L89153 Pressure ulcer of sacral region, stage 3: Secondary | ICD-10-CM | POA: Diagnosis not present

## 2023-10-22 DIAGNOSIS — E7800 Pure hypercholesterolemia, unspecified: Secondary | ICD-10-CM | POA: Diagnosis not present

## 2023-10-22 DIAGNOSIS — I480 Paroxysmal atrial fibrillation: Secondary | ICD-10-CM | POA: Diagnosis not present

## 2023-10-22 DIAGNOSIS — J9601 Acute respiratory failure with hypoxia: Secondary | ICD-10-CM | POA: Diagnosis not present

## 2023-10-22 DIAGNOSIS — D631 Anemia in chronic kidney disease: Secondary | ICD-10-CM | POA: Diagnosis not present

## 2023-10-22 DIAGNOSIS — I110 Hypertensive heart disease with heart failure: Secondary | ICD-10-CM | POA: Diagnosis not present

## 2023-10-22 DIAGNOSIS — E1122 Type 2 diabetes mellitus with diabetic chronic kidney disease: Secondary | ICD-10-CM | POA: Diagnosis not present

## 2023-10-22 DIAGNOSIS — N1831 Chronic kidney disease, stage 3a: Secondary | ICD-10-CM | POA: Diagnosis not present

## 2023-10-22 DIAGNOSIS — I2510 Atherosclerotic heart disease of native coronary artery without angina pectoris: Secondary | ICD-10-CM | POA: Diagnosis not present

## 2023-10-22 NOTE — Telephone Encounter (Signed)
*  STAT* If patient is at the pharmacy, call can be transferred to refill team.   1. Which medications need to be refilled? (please list name of each medication and dose if known) amiodarone (PACERONE) 200 MG tablet   furosemide (LASIX) 40 MG tablet   losartan (COZAAR) 25 MG tablet   metoprolol succinate (TOPROL-XL) 25 MG 24 hr tablet   2. Which pharmacy/location (including street and city if local pharmacy) is medication to be sent to?  [id number] - [place], [place] - [address]    3. Do they need a 30 day or 90 day supply? 90 .

## 2023-10-25 MED ORDER — AMIODARONE HCL 200 MG PO TABS: ORAL_TABLET | ORAL | 3 refills | Status: AC

## 2023-10-25 MED ORDER — METOPROLOL SUCCINATE ER 25 MG PO TB24: 25.0000 mg | ORAL_TABLET | Freq: Every day | ORAL | 3 refills | Status: AC

## 2023-10-25 MED ORDER — LOSARTAN POTASSIUM 25 MG PO TABS: 25.0000 mg | ORAL_TABLET | Freq: Every day | ORAL | 3 refills | Status: AC

## 2023-10-25 MED ORDER — FUROSEMIDE 40 MG PO TABS: 40.0000 mg | ORAL_TABLET | Freq: Every day | ORAL | 3 refills | Status: AC

## 2023-10-26 ENCOUNTER — Telehealth: Payer: Self-pay | Admitting: Cardiology

## 2023-10-26 DIAGNOSIS — I5033 Acute on chronic diastolic (congestive) heart failure: Secondary | ICD-10-CM | POA: Diagnosis not present

## 2023-10-26 DIAGNOSIS — L89153 Pressure ulcer of sacral region, stage 3: Secondary | ICD-10-CM | POA: Diagnosis not present

## 2023-10-26 DIAGNOSIS — E7800 Pure hypercholesterolemia, unspecified: Secondary | ICD-10-CM | POA: Diagnosis not present

## 2023-10-26 DIAGNOSIS — E1122 Type 2 diabetes mellitus with diabetic chronic kidney disease: Secondary | ICD-10-CM | POA: Diagnosis not present

## 2023-10-26 DIAGNOSIS — D631 Anemia in chronic kidney disease: Secondary | ICD-10-CM | POA: Diagnosis not present

## 2023-10-26 DIAGNOSIS — I480 Paroxysmal atrial fibrillation: Secondary | ICD-10-CM | POA: Diagnosis not present

## 2023-10-26 DIAGNOSIS — I2510 Atherosclerotic heart disease of native coronary artery without angina pectoris: Secondary | ICD-10-CM | POA: Diagnosis not present

## 2023-10-26 DIAGNOSIS — I110 Hypertensive heart disease with heart failure: Secondary | ICD-10-CM | POA: Diagnosis not present

## 2023-10-26 DIAGNOSIS — N1831 Chronic kidney disease, stage 3a: Secondary | ICD-10-CM | POA: Diagnosis not present

## 2023-10-26 NOTE — Telephone Encounter (Signed)
 Spoke with [place] from [place] and she states they have weight parameters for patient from   PCP. She is to contact the office when she is outside of 183-193 weight range. Patient has lost weight and her most recent weight here in office was 166. Eileen Stanford stated she has been staying around 170 and would like new parameters so they don't have to contact our office and PCO everyday because she is out of her weight parameters.   Did inform Eileen Stanford to contact PCP and she stated PCP advised she contact cardiology.

## 2023-10-26 NOTE — Telephone Encounter (Signed)
 Erika Gates with [facility] called in stating "Currently we have orders to notify PCP of any weight outside of range 183-193 lbs, but we are getting an alert every time we see her and I see at her cardiology visit she was 166lbs. I asked PCP if I can change herr parameters and she suggested I reach out to Dr. Anne Fu."   Please advise.

## 2023-10-28 DIAGNOSIS — I480 Paroxysmal atrial fibrillation: Secondary | ICD-10-CM | POA: Diagnosis not present

## 2023-10-28 DIAGNOSIS — E7800 Pure hypercholesterolemia, unspecified: Secondary | ICD-10-CM | POA: Diagnosis not present

## 2023-10-28 DIAGNOSIS — N1831 Chronic kidney disease, stage 3a: Secondary | ICD-10-CM | POA: Diagnosis not present

## 2023-10-28 DIAGNOSIS — E1122 Type 2 diabetes mellitus with diabetic chronic kidney disease: Secondary | ICD-10-CM | POA: Diagnosis not present

## 2023-10-28 DIAGNOSIS — I110 Hypertensive heart disease with heart failure: Secondary | ICD-10-CM | POA: Diagnosis not present

## 2023-10-28 DIAGNOSIS — D631 Anemia in chronic kidney disease: Secondary | ICD-10-CM | POA: Diagnosis not present

## 2023-10-28 DIAGNOSIS — I2510 Atherosclerotic heart disease of native coronary artery without angina pectoris: Secondary | ICD-10-CM | POA: Diagnosis not present

## 2023-10-28 DIAGNOSIS — I5033 Acute on chronic diastolic (congestive) heart failure: Secondary | ICD-10-CM | POA: Diagnosis not present

## 2023-10-28 DIAGNOSIS — L89153 Pressure ulcer of sacral region, stage 3: Secondary | ICD-10-CM | POA: Diagnosis not present

## 2023-10-28 NOTE — Telephone Encounter (Signed)
 Patient now 166 lbs Set parameters to notify if weight > 176 (would expect this to be because of fluid gain) Do not have a bottom weight parameter.  Thanks Donato Schultz, MD    Called and spoke with Eileen Stanford who is aware and repeated order back to me.  She was appreciative of the call and information.

## 2023-11-04 DIAGNOSIS — E1122 Type 2 diabetes mellitus with diabetic chronic kidney disease: Secondary | ICD-10-CM | POA: Diagnosis not present

## 2023-11-04 DIAGNOSIS — N1831 Chronic kidney disease, stage 3a: Secondary | ICD-10-CM | POA: Diagnosis not present

## 2023-11-04 DIAGNOSIS — L89153 Pressure ulcer of sacral region, stage 3: Secondary | ICD-10-CM | POA: Diagnosis not present

## 2023-11-04 DIAGNOSIS — E7800 Pure hypercholesterolemia, unspecified: Secondary | ICD-10-CM | POA: Diagnosis not present

## 2023-11-04 DIAGNOSIS — I5033 Acute on chronic diastolic (congestive) heart failure: Secondary | ICD-10-CM | POA: Diagnosis not present

## 2023-11-04 DIAGNOSIS — I110 Hypertensive heart disease with heart failure: Secondary | ICD-10-CM | POA: Diagnosis not present

## 2023-11-04 DIAGNOSIS — I480 Paroxysmal atrial fibrillation: Secondary | ICD-10-CM | POA: Diagnosis not present

## 2023-11-04 DIAGNOSIS — I2510 Atherosclerotic heart disease of native coronary artery without angina pectoris: Secondary | ICD-10-CM | POA: Diagnosis not present

## 2023-11-04 DIAGNOSIS — D631 Anemia in chronic kidney disease: Secondary | ICD-10-CM | POA: Diagnosis not present

## 2023-11-09 DIAGNOSIS — K921 Melena: Secondary | ICD-10-CM | POA: Diagnosis not present

## 2023-11-09 DIAGNOSIS — Z7901 Long term (current) use of anticoagulants: Secondary | ICD-10-CM | POA: Diagnosis not present

## 2023-11-09 DIAGNOSIS — D649 Anemia, unspecified: Secondary | ICD-10-CM | POA: Diagnosis not present

## 2023-11-11 DIAGNOSIS — E1122 Type 2 diabetes mellitus with diabetic chronic kidney disease: Secondary | ICD-10-CM | POA: Diagnosis not present

## 2023-11-11 DIAGNOSIS — N1831 Chronic kidney disease, stage 3a: Secondary | ICD-10-CM | POA: Diagnosis not present

## 2023-11-11 DIAGNOSIS — L89153 Pressure ulcer of sacral region, stage 3: Secondary | ICD-10-CM | POA: Diagnosis not present

## 2023-11-11 DIAGNOSIS — E7800 Pure hypercholesterolemia, unspecified: Secondary | ICD-10-CM | POA: Diagnosis not present

## 2023-11-11 DIAGNOSIS — I2510 Atherosclerotic heart disease of native coronary artery without angina pectoris: Secondary | ICD-10-CM | POA: Diagnosis not present

## 2023-11-11 DIAGNOSIS — I110 Hypertensive heart disease with heart failure: Secondary | ICD-10-CM | POA: Diagnosis not present

## 2023-11-11 DIAGNOSIS — D631 Anemia in chronic kidney disease: Secondary | ICD-10-CM | POA: Diagnosis not present

## 2023-11-11 DIAGNOSIS — I5033 Acute on chronic diastolic (congestive) heart failure: Secondary | ICD-10-CM | POA: Diagnosis not present

## 2023-11-11 DIAGNOSIS — I480 Paroxysmal atrial fibrillation: Secondary | ICD-10-CM | POA: Diagnosis not present

## 2023-11-17 DIAGNOSIS — H524 Presbyopia: Secondary | ICD-10-CM | POA: Diagnosis not present

## 2023-11-17 DIAGNOSIS — H4322 Crystalline deposits in vitreous body, left eye: Secondary | ICD-10-CM | POA: Diagnosis not present

## 2023-11-17 DIAGNOSIS — Z961 Presence of intraocular lens: Secondary | ICD-10-CM | POA: Diagnosis not present

## 2023-11-17 DIAGNOSIS — H35033 Hypertensive retinopathy, bilateral: Secondary | ICD-10-CM | POA: Diagnosis not present

## 2023-11-17 DIAGNOSIS — H52223 Regular astigmatism, bilateral: Secondary | ICD-10-CM | POA: Diagnosis not present

## 2023-11-17 DIAGNOSIS — E113293 Type 2 diabetes mellitus with mild nonproliferative diabetic retinopathy without macular edema, bilateral: Secondary | ICD-10-CM | POA: Diagnosis not present

## 2023-11-19 DIAGNOSIS — J9601 Acute respiratory failure with hypoxia: Secondary | ICD-10-CM | POA: Diagnosis not present

## 2023-12-20 DIAGNOSIS — J9601 Acute respiratory failure with hypoxia: Secondary | ICD-10-CM | POA: Diagnosis not present

## 2024-01-06 ENCOUNTER — Ambulatory Visit (HOSPITAL_BASED_OUTPATIENT_CLINIC_OR_DEPARTMENT_OTHER): Payer: Medicare PPO | Admitting: Cardiology

## 2024-01-06 VITALS — BP 140/58 | HR 55 | Ht 60.0 in

## 2024-01-06 DIAGNOSIS — I2510 Atherosclerotic heart disease of native coronary artery without angina pectoris: Secondary | ICD-10-CM | POA: Diagnosis not present

## 2024-01-06 DIAGNOSIS — I5032 Chronic diastolic (congestive) heart failure: Secondary | ICD-10-CM

## 2024-01-06 DIAGNOSIS — I480 Paroxysmal atrial fibrillation: Secondary | ICD-10-CM | POA: Diagnosis not present

## 2024-01-06 DIAGNOSIS — N1832 Chronic kidney disease, stage 3b: Secondary | ICD-10-CM | POA: Diagnosis not present

## 2024-01-06 NOTE — Progress Notes (Signed)
 Cardiology Office Note:  .   Date:  01/06/2024  ID:  Erika Gates, DOB 1934-12-12, MRN [id number] PCP: Sun, Vyvyan, MD  [facility] Providers Cardiologist:  Dorothye Gathers, MD     History of Present Illness: Erika Gates   Erika Gates is a 89 y.o. female Discussed the use of AI scribe software for clinical note transcription with the patient, who gave verbal consent to proceed.  History of Present Illness Erika Gates is an 89 year old female with coronary artery disease and chronic diastolic heart failure who presents for follow-up.  She has a history of coronary artery disease, status post coronary artery bypass graft in 1996. She was hospitalized in December 2020 for a post-operative PEA arrest. During a recent hospital stay, she underwent diuresis, losing 10 liters of fluid, which has helped maintain her weight. She feels 'pretty good' since the fluid removal but dislikes taking 40 mg of Lasix  due to frequent urination, especially at night.  She has persistent atrial fibrillation and has been in and out of atrial fibrillation. She has undergone two prior cardioversions and is currently maintaining a regular rhythm on amiodarone  and Toprol . Her CHADS-VASc score is 7. She is not on anticoagulation due to chronic anemia and a history of GI bleed. No chest pain and she is able to weigh herself at home to monitor fluid retention.  She has chronic kidney disease, stage 3A, and chronic anemia, for which she has received transfusions. She is not currently on anticoagulation due to the risk of bleeding. Her blood pressure was recorded at 140/58 today, slightly higher than previous readings, but she notes that her blood pressure is likely better at home.  She has a history of hypertension, hyperlipidemia, type 2 diabetes, and morbid obesity. She uses compression stockings to help manage fluid retention in her legs. She has a history of spinal fusion surgery and has been noted to have significant  weight loss, attributed to fluid loss.      Studies Reviewed: Erika Gates   EKG Interpretation Date/Time:  Thursday Jan 06 2024 12:31:38 EDT Ventricular Rate:  46 PR Interval:  142 QRS Duration:  156 QT Interval:  554 QTC Calculation: 484 R Axis:   -15  Text Interpretation: Sinus bradycardia Right bundle branch block When compared with ECG of 27-Sep-2023 02:24, Sinus rhythm has replaced Atrial fibrillation Vent. rate has decreased BY  45 BPM Questionable change in QRS axis T wave inversion no longer evident in Inferior leads Confirmed by Dorothye Gathers ([id number]) on 01/06/2024 5:23:01 PM    Results Echocardiogram: EF 65%, moderate mitral valve regurgitation, no aortic stenosis (09/23/2023)  Risk Assessment/Calculations:           Physical Exam:   VS:  BP (!) 140/58   Pulse (!) 55   Ht 5' (1.524 m)   SpO2 99%   BMI 32.42 kg/m    Wt Readings from Last 3 Encounters:  10/19/23 166 lb (75.3 kg)  09/29/23 176 lb 2.4 oz (79.9 kg)  04/22/23 206 lb 5.6 oz (93.6 kg)    GEN: Well nourished, well developed in no acute distress, in wheelchair NECK: No JVD; No carotid bruits CARDIAC: Bradycardic regular, no murmurs, no rubs, no gallops RESPIRATORY:  Clear to auscultation without rales, wheezing or rhonchi  ABDOMEN: Soft, non-tender, non-distended EXTREMITIES: Chronic lower extremity edema improved, compression hose; No deformity, right leg brace  ASSESSMENT AND PLAN: .    Assessment and Plan Assessment & Plan Chronic diastolic heart failure Chronic  diastolic heart failure with recent hospitalization for fluid overload, diuresis of 10 liters achieved. Currently well-managed with no significant fluid accumulation. Weight is stable, and fluid management is effective with current diuretic regimen. Lasix  is causing frequent urination, but discontinuation could lead to fluid accumulation and potential hospitalization. - Continue current diuretic regimen with Lasix  40 mg. - Monitor weight at home to  detect early signs of fluid accumulation. - Advise to take an extra dose of Lasix  if weight increases by 3-5 pounds in a day or two.  Persistent atrial fibrillation Persistent atrial fibrillation well-controlled with amiodarone  and toprol . Regular rhythm on examination. No recent episodes of atrial fibrillation since discharge. Avoidance of anticoagulation due to GI bleeding risk. - Continue current medications: amiodarone  and toprol .  Coronary artery disease, post CABG Coronary artery disease status post CABG in 1996. No current chest pain or angina symptoms. Cardiovascular status is well-managed. - Continue current management and monitoring.  Hypertension Hypertension with recent readings of 138/58 mmHg in February and 140/58 mmHg today. Blood pressure slightly elevated but not dangerously high. Fluid status may affect blood pressure readings. Avoidance of additional antihypertensive medications to prevent hypotension and potential falls. - Continue monitoring blood pressure. - Avoid additional antihypertensive medications to prevent hypotension.  Chronic kidney disease, stage 3A Chronic kidney disease stage 3A. No specific changes or management discussed in this encounter.  Chronic anemia Chronic anemia with GI bleed. Currently well-managed post-transfusion. Not on anticoagulation due to bleeding risk. GI doctor reports condition is well-managed. - Continue to avoid anticoagulation due to GI bleeding risk. - Monitor hemoglobin levels as needed.          Signed, Dorothye Gathers, MD

## 2024-01-06 NOTE — Patient Instructions (Signed)
 Medication Instructions:  Your physician recommends that you continue on your current medications as directed. Please refer to the Current Medication list given to you today.   *If you need a refill on your cardiac medications before your next appointment, please call your pharmacy*  Lab Work: NONE  Testing/Procedures: NONE  Follow-Up: At [facility], you and your health needs are our priority.  As part of our continuing mission to provide you with exceptional heart care, our providers are all part of one team.  This team includes your primary Cardiologist (physician) and Advanced Practice Providers or APPs (Physician Assistants and Nurse Practitioners) who all work together to provide you with the care you need, when you need it.  Your next appointment:   6 month(s)  Provider:    Reesa Cannon PA   We recommend signing up for the patient portal called "MyChart".  Sign up information is provided on this After Visit Summary.  MyChart is used to connect with patients for Virtual Visits (Telemedicine).  Patients are able to view lab/test results, encounter notes, upcoming appointments, etc.  Non-urgent messages can be sent to your provider as well.   To learn more about what you can do with MyChart, go to [email or web address].

## 2024-01-19 DIAGNOSIS — J9601 Acute respiratory failure with hypoxia: Secondary | ICD-10-CM | POA: Diagnosis not present

## 2024-02-19 DIAGNOSIS — J9601 Acute respiratory failure with hypoxia: Secondary | ICD-10-CM | POA: Diagnosis not present

## 2024-03-20 DIAGNOSIS — E1122 Type 2 diabetes mellitus with diabetic chronic kidney disease: Secondary | ICD-10-CM | POA: Diagnosis not present

## 2024-03-20 DIAGNOSIS — I10 Essential (primary) hypertension: Secondary | ICD-10-CM | POA: Diagnosis not present

## 2024-03-20 DIAGNOSIS — E785 Hyperlipidemia, unspecified: Secondary | ICD-10-CM | POA: Diagnosis not present

## 2024-03-20 DIAGNOSIS — J9601 Acute respiratory failure with hypoxia: Secondary | ICD-10-CM | POA: Diagnosis not present

## 2024-03-22 DIAGNOSIS — Z1331 Encounter for screening for depression: Secondary | ICD-10-CM | POA: Diagnosis not present

## 2024-03-22 DIAGNOSIS — E113293 Type 2 diabetes mellitus with mild nonproliferative diabetic retinopathy without macular edema, bilateral: Secondary | ICD-10-CM | POA: Diagnosis not present

## 2024-03-22 DIAGNOSIS — F411 Generalized anxiety disorder: Secondary | ICD-10-CM | POA: Diagnosis not present

## 2024-03-22 DIAGNOSIS — Z0000 Encounter for general adult medical examination without abnormal findings: Secondary | ICD-10-CM | POA: Diagnosis not present

## 2024-03-22 DIAGNOSIS — N2581 Secondary hyperparathyroidism of renal origin: Secondary | ICD-10-CM | POA: Diagnosis not present

## 2024-03-22 DIAGNOSIS — E1122 Type 2 diabetes mellitus with diabetic chronic kidney disease: Secondary | ICD-10-CM | POA: Diagnosis not present

## 2024-03-22 DIAGNOSIS — E785 Hyperlipidemia, unspecified: Secondary | ICD-10-CM | POA: Diagnosis not present

## 2024-03-22 DIAGNOSIS — N1831 Chronic kidney disease, stage 3a: Secondary | ICD-10-CM | POA: Diagnosis not present

## 2024-03-22 DIAGNOSIS — I10 Essential (primary) hypertension: Secondary | ICD-10-CM | POA: Diagnosis not present

## 2024-04-20 DIAGNOSIS — J9601 Acute respiratory failure with hypoxia: Secondary | ICD-10-CM | POA: Diagnosis not present

## 2024-05-17 DIAGNOSIS — Z961 Presence of intraocular lens: Secondary | ICD-10-CM | POA: Diagnosis not present

## 2024-05-17 DIAGNOSIS — E113293 Type 2 diabetes mellitus with mild nonproliferative diabetic retinopathy without macular edema, bilateral: Secondary | ICD-10-CM | POA: Diagnosis not present

## 2024-05-17 DIAGNOSIS — H35033 Hypertensive retinopathy, bilateral: Secondary | ICD-10-CM | POA: Diagnosis not present

## 2024-05-17 DIAGNOSIS — H4322 Crystalline deposits in vitreous body, left eye: Secondary | ICD-10-CM | POA: Diagnosis not present

## 2024-05-21 DIAGNOSIS — J9601 Acute respiratory failure with hypoxia: Secondary | ICD-10-CM | POA: Diagnosis not present

## 2024-06-20 DIAGNOSIS — J9601 Acute respiratory failure with hypoxia: Secondary | ICD-10-CM | POA: Diagnosis not present
